# Patient Record
Sex: Female | Born: 1995 | Race: White | Hispanic: No | Smoking: Current every day smoker
Health system: Southern US, Community
[De-identification: ages and names within clinical notes are randomized; demographics above are authoritative.]

## PROBLEM LIST (undated history)

## (undated) DIAGNOSIS — F329 Major depressive disorder, single episode, unspecified: Secondary | ICD-10-CM

## (undated) DIAGNOSIS — F29 Unspecified psychosis not due to a substance or known physiological condition: Secondary | ICD-10-CM

## (undated) DIAGNOSIS — R87629 Unspecified abnormal cytological findings in specimens from vagina: Secondary | ICD-10-CM

## (undated) DIAGNOSIS — F319 Bipolar disorder, unspecified: Secondary | ICD-10-CM

## (undated) DIAGNOSIS — F32A Depression, unspecified: Secondary | ICD-10-CM

## (undated) DIAGNOSIS — F419 Anxiety disorder, unspecified: Secondary | ICD-10-CM

## (undated) DIAGNOSIS — F209 Schizophrenia, unspecified: Secondary | ICD-10-CM

## (undated) DIAGNOSIS — E282 Polycystic ovarian syndrome: Secondary | ICD-10-CM

## (undated) DIAGNOSIS — G43009 Migraine without aura, not intractable, without status migrainosus: Secondary | ICD-10-CM

## (undated) DIAGNOSIS — A64 Unspecified sexually transmitted disease: Secondary | ICD-10-CM

## (undated) HISTORY — DX: Major depressive disorder, single episode, unspecified: F32.9

## (undated) HISTORY — DX: Depression, unspecified: F32.A

## (undated) HISTORY — DX: Unspecified sexually transmitted disease: A64

## (undated) HISTORY — DX: Migraine without aura, not intractable, without status migrainosus: G43.009

## (undated) HISTORY — DX: Anxiety disorder, unspecified: F41.9

---
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
## 2013-01-06 ENCOUNTER — Encounter: Payer: Medicaid Other | Admitting: Obstetrics and Gynecology

## 2013-06-21 ENCOUNTER — Encounter: Payer: Medicaid Other | Admitting: Obstetrics and Gynecology

## 2013-06-28 ENCOUNTER — Ambulatory Visit (INDEPENDENT_AMBULATORY_CARE_PROVIDER_SITE_OTHER): Payer: BC Managed Care – PPO | Admitting: Obstetrics and Gynecology

## 2013-06-28 ENCOUNTER — Encounter (INDEPENDENT_AMBULATORY_CARE_PROVIDER_SITE_OTHER): Payer: Self-pay

## 2013-06-28 ENCOUNTER — Encounter: Payer: Self-pay | Admitting: Obstetrics and Gynecology

## 2013-06-28 VITALS — BP 136/60 | Ht 65.0 in | Wt 184.6 lb

## 2013-06-28 DIAGNOSIS — E282 Polycystic ovarian syndrome: Secondary | ICD-10-CM

## 2013-06-28 DIAGNOSIS — N898 Other specified noninflammatory disorders of vagina: Secondary | ICD-10-CM

## 2013-06-28 MED ORDER — NORGESTIM-ETH ESTRAD TRIPHASIC 0.18/0.215/0.25 MG-25 MCG PO TABS: 1.0000 | ORAL_TABLET | Freq: Every day | ORAL | Status: DC

## 2013-06-28 NOTE — Patient Instructions (Signed)
Restart OCP's  Dr T.C.[place] at [facility]

## 2013-06-28 NOTE — Progress Notes (Signed)
This chart was transcribed for Dr. Christin BachJohn Billiejo [name] by Theresa [name], ED scribe.   [facility] Visit  Patient name: Theresa [name] MRN [id number]  Date of birth: 02-15-1996  CC & HPI:  Theresa [name] is a 18 y.o. female presenting today for an abnormal pap smear follow up. [facility] She also reports increased appetite and weight gain in the past 1 month. She is sexually active. LMP was 4 days ago.   No family history of DVT/PE Hx elevated testosterone levels to 90 pg/ml, resolved to 50's with onset of regular menses. ROS:  Negative except listed above  Pertinent History Reviewed:  Medical & Surgical Hx:  Reviewed: Significant for   Past Medical History  Diagnosis Date  . Polycystic ovary syndrome     History reviewed. No pertinent past surgical history.   Medications: Reviewed & Updated - see associated section Social History: Reviewed -  reports that she has been smoking Cigarettes.  She has been smoking about 0.50 packs per day. She has never used smokeless tobacco.  Objective Findings:  Vitals: BP 136/60  Ht 5\' 5"  (1.651 m)  Wt 184 lb 9.6 oz (83.734 kg)  BMI 30.72 kg/m2  LMP 06/21/2013  Physical Examination: General appearance - alert, well appearing, and in no distress and oriented to person, place, and time Pelvic - VULVA: normal appearing vulva with no masses, tenderness or lesions, VAGINA: normal appearing vagina with normal color and discharge, no lesions, CERVIX: normal appearing cervix without discharge or lesions   Assessment & Plan:   A:  Tanner stage IV History of irregular periods Elevated testosterone levels consistent with PCOS  P: Birth control to regulate menstrual cycle

## 2013-06-28 NOTE — Addendum Note (Signed)
Addended by: Gaylyn RongEVANS, Carrell Rahmani A on: 06/28/2013 04:48 PM   Modules accepted: Orders

## 2013-06-30 NOTE — Addendum Note (Signed)
Addended by: Richardson ChiquitoRAVIS, Merica Prell M on: 06/30/2013 10:13 AM   Modules accepted: Orders

## 2014-05-19 ENCOUNTER — Emergency Department (HOSPITAL_COMMUNITY)
Admission: EM | Admit: 2014-05-19 | Discharge: 2014-05-19 | Disposition: A | Discharge status: Home / Self Care | Payer: Medicaid Other | Attending: Emergency Medicine | Admitting: Emergency Medicine

## 2014-05-19 ENCOUNTER — Encounter (HOSPITAL_COMMUNITY): Payer: Self-pay | Admitting: Emergency Medicine

## 2014-05-19 DIAGNOSIS — L259 Unspecified contact dermatitis, unspecified cause: Secondary | ICD-10-CM | POA: Insufficient documentation

## 2014-05-19 DIAGNOSIS — E282 Polycystic ovarian syndrome: Secondary | ICD-10-CM | POA: Insufficient documentation

## 2014-05-19 DIAGNOSIS — Z720 Tobacco use: Secondary | ICD-10-CM | POA: Insufficient documentation

## 2014-05-19 DIAGNOSIS — R002 Palpitations: Secondary | ICD-10-CM | POA: Insufficient documentation

## 2014-05-19 DIAGNOSIS — Z79899 Other long term (current) drug therapy: Secondary | ICD-10-CM | POA: Insufficient documentation

## 2014-05-19 DIAGNOSIS — L309 Dermatitis, unspecified: Secondary | ICD-10-CM

## 2014-05-19 HISTORY — DX: Unspecified abnormal cytological findings in specimens from vagina: R87.629

## 2014-05-19 MED ORDER — FLUOCINONIDE-E 0.05 % EX CREA: 1.0000 "application " | TOPICAL_CREAM | Freq: Two times a day (BID) | CUTANEOUS | Status: DC

## 2014-05-19 NOTE — Discharge Instructions (Signed)
Palpitations A palpitation is the feeling that your heartbeat is irregular or is faster than normal. It may feel like your heart is fluttering or skipping a beat. Palpitations are usually not a serious problem. However, in some cases, you may need further medical evaluation. CAUSES  Palpitations can be caused by:  Smoking.  Caffeine or other stimulants, such as diet pills or energy drinks.  Alcohol.  Stress and anxiety.  Strenuous physical activity.  Fatigue.  Certain medicines.  Heart disease, especially if you have a history of irregular heart rhythms (arrhythmias), such as atrial fibrillation, atrial flutter, or supraventricular tachycardia.  An improperly working pacemaker or defibrillator. DIAGNOSIS  To find the cause of your palpitations, your health care provider will take your medical history and perform a physical exam. Your health care provider may also have you take a test called an ambulatory electrocardiogram (ECG). An ECG records your heartbeat patterns over a 24-hour period. You may also have other tests, such as:  Transthoracic echocardiogram (TTE). During echocardiography, sound waves are used to evaluate how blood flows through your heart.  Transesophageal echocardiogram (TEE).  Cardiac monitoring. This allows your health care provider to monitor your heart rate and rhythm in real time.  Holter monitor. This is a portable device that records your heartbeat and can help diagnose heart arrhythmias. It allows your health care provider to track your heart activity for several days, if needed.  Stress tests by exercise or by giving medicine that makes the heart beat faster. TREATMENT  Treatment of palpitations depends on the cause of your symptoms and can vary greatly. Most cases of palpitations do not require any treatment other than time, relaxation, and monitoring your symptoms. Other causes, such as atrial fibrillation, atrial flutter, or supraventricular  tachycardia, usually require further treatment. HOME CARE INSTRUCTIONS   Avoid:  Caffeinated coffee, tea, soft drinks, diet pills, and energy drinks.  Chocolate.  Alcohol.  Stop smoking if you smoke.  Reduce your stress and anxiety. Things that can help you relax include:  A method of controlling things in your body, such as your heartbeats, with your mind (biofeedback).  Yoga.  Meditation.  Physical activity such as swimming, jogging, or walking.  Get plenty of rest and sleep. SEEK MEDICAL CARE IF:   You continue to have a fast or irregular heartbeat beyond 24 hours.  Your palpitations occur more often. SEEK IMMEDIATE MEDICAL CARE IF:  You have chest pain or shortness of breath.  You have a severe headache.  You feel dizzy or you faint. MAKE SURE YOU:  Understand these instructions.  Will watch your condition.  Will get help right away if you are not doing well or get worse. Document Released: 05/10/2000 Document Revised: 05/18/2013 Document Reviewed: 07/12/2011 [facility] Patient Information 2015 [employer] [place]. This information is not intended to replace advice given to you by your health care provider. Make sure you discuss any questions you have with your health care provider.  Fluocinolone skin cream, ointment, or topical solution What is this medicine? FLUOCINOLONE (floo oh SIN oh lone) is a corticosteroid. It is used on the skin to reduce swelling, redness, itching, and allergic reactions. This medicine may be used for other purposes; ask your health care provider or pharmacist if you have questions. COMMON BRAND NAME(S): Fluonid, Synalar What should I tell my health care provider before I take this medicine? They need to know if you have any of these conditions: -any active infection -diabetes -large areas of burned or damaged skin -  skin wasting or thinning -an unusual or allergic reaction to fluocinolone, steroids, other medicines, foods, dyes, or  preservatives -pregnant or trying to get pregnant -breast-feeding How should I use this medicine? This medicine is for external use only. Do not take by mouth. Follow the directions on the prescription label. Wash your hands before and after use. Apply a thin film of medicine to the affected area. Do not cover with a bandage or dressing unless your doctor or health care professional tells you to. Do not use on healthy skin or over large areas of skin. Do not get this medicine in your eyes. If you do, rinse out with plenty of cool tap water. It is important not to use more medicine than prescribed. Do not use your medicine more often than directed. Do not use for more than 14 days. Talk to your pediatrician regarding the use of this medicine in children. Special care may be needed. While this drug may be prescribed for children as young as 30 years of age for selected conditions, precautions do apply. If applying this medicine to the diaper area of a child, do not cover with tight-fitting diapers or plastic pants. This may increase the amount of medicine that passes through the skin and increase the risk of serious side effects. Elderly patients are more likely to have damaged skin through aging, and this may increase side effects. This medicine should only be used for brief periods and infrequently in older patients. Overdosage: If you think you have taken too much of this medicine contact a poison control center or emergency room at once. NOTE: This medicine is only for you. Do not share this medicine with others. What if I miss a dose? If you miss a dose, use it as soon as you can. If it is almost time for your next dose, use only that dose. Do not use double or extra doses. What may interact with this medicine? Interactions are not expected. Do not use any other skin products without telling your doctor or health care professional. This list may not describe all possible interactions. Give your health  care provider a list of all the medicines, herbs, non-prescription drugs, or dietary supplements you use. Also tell them if you smoke, drink alcohol, or use illegal drugs. Some items may interact with your medicine. What should I watch for while using this medicine? Tell your doctor or health care professional if your symptoms do not improve within one week. Tell your doctor or health care professional if you are exposed to anyone with measles or chickenpox, or if you develop sores or blisters that do not heal properly. What side effects may I notice from receiving this medicine? Side effects that you should report to your doctor or health care professional as soon as possible: -burning or itching of the skin -dark red spots on the skin -infection -lack of healing of skin condition -painful, red, pus filled blisters in hair follicles -thinning of the skin, sunburn more likely especially on the face Side effects that usually do not require medical attention (report to your doctor or health care professional if they continue or are bothersome): -dry skin, irritation -unusual increased growth of hair on the face or body This list may not describe all possible side effects. Call your doctor for medical advice about side effects. You may report side effects to FDA at 1-800-FDA-1088. Where should I keep my medicine? Keep out of the reach of children. Store at room temperature between 15  and 30 degrees C (59 and 86 degrees F). Protect from heat and direct light. Do not freeze. Throw away any unused medicine after the expiration date. NOTE: This sheet is a summary. It may not cover all possible information. If you have questions about this medicine, talk to your doctor, pharmacist, or health care provider.  2015, [employer]. (2007-09-16 16:57:22)

## 2014-05-19 NOTE — ED Notes (Signed)
Pt. Reports "I feel like my heart is skipping a beat". Pt. Reports this started yesterday and is intermittent. Pt. Also c/o rash to left foot.

## 2014-05-19 NOTE — ED Provider Notes (Signed)
CSN: [id number]     Arrival date & time 05/19/14  0212 History   First MD Initiated Contact with Patient 05/19/14 0435     Chief Complaint  Patient presents with  . Palpitations     (Consider location/radiation/quality/duration/timing/severity/associated sxs/prior Treatment) Patient is a 18 y.o. female presenting with palpitations. The history is provided by the patient.  Palpitations She has intermittent palpitations and has been having for the past year. She feels like there is a skipped beat or an extra beat. Symptoms have been stable over the last year. She denies chest pain, heaviness, tightness, pressure. She is also complaining of a rash on her feet. This is been present for a month. She's tried a variety of athlete's foot remedies without any benefit. The rashes on her toes and her shoes irritated. She denies any unusual exposures.  Past Medical History  Diagnosis Date  . Polycystic ovary syndrome   . Abnormal vaginal Pap smear    History reviewed. No pertinent past surgical history. Family History  Problem Relation Age of Onset  . Hypertension Mother   . Heart disease Mother   . Cancer Mother   . Hypertension Other   . Depression Father   . Drug abuse Father   . Bipolar disorder Father   . Heart disease Maternal Grandmother   . Pancreatitis Maternal Grandfather   . Cancer Paternal Grandmother   . Cancer Paternal Grandfather    History  Substance Use Topics  . Smoking status: Current Every Day Smoker -- 0.50 packs/day    Types: Cigarettes  . Smokeless tobacco: Never Used  . Alcohol Use: No   OB History    No data available     Review of Systems  Cardiovascular: Positive for palpitations.  All other systems reviewed and are negative.     Allergies  Review of patient's allergies indicates no known allergies.  Home Medications   Prior to Admission medications   Medication Sig Start Date End Date Taking? Authorizing Provider  Norgestimate-Ethinyl  Estradiol Triphasic (ORTHO TRI-CYCLEN LO) 0.18/0.215/0.25 MG-25 MCG tab Take 1 tablet by mouth daily. 06/28/13   Tilda BurrowJohn Ferguson V, MD   BP 115/61 mmHg  Pulse 83  Temp(Src) 97.6 F (36.4 C) (Oral)  Resp 12  Ht 5\' 3"  (1.6 m)  Wt 183 lb (83.008 kg)  BMI 32.43 kg/m2  SpO2 100%  LMP 05/12/2014 Physical Exam  Nursing note and vitals reviewed.  18 year old female, resting comfortably and in no acute distress. Vital signs are normal. Oxygen saturation is 100%, which is normal. Head is normocephalic and atraumatic. PERRLA, EOMI. Oropharynx is clear. Neck is nontender and supple without adenopathy or JVD. Back is nontender and there is no CVA tenderness. Lungs are clear without rales, wheezes, or rhonchi. Chest is nontender. Heart has regular rate and rhythm without murmur. Abdomen is soft, flat, nontender without masses or hepatosplenomegaly and peristalsis is normoactive. Extremities have no cyanosis or edema, full range of motion is present. There is thickening of the skin on the dorsum of the left third and fourth toes as well as the right third toe. There is no maceration or inflammation in the intertriginous areas. Skin is warm and dry without rash. Neurologic: Mental status is normal, cranial nerves are intact, there are no motor or sensory deficits.  ED Course  Procedures (including critical care time) Labs Review Labs Reviewed - No data to display  Imaging Review No results found.   EKG Interpretation   Date/Time:  Thursday May 19 2014 03:17:21 EST Ventricular Rate:  60 PR Interval:  121 QRS Duration: 114 QT Interval:  424 QTC Calculation: 424 R Axis:   82 Text Interpretation:  Sinus rhythm Borderline intraventricular conduction  delay Baseline wander in lead(s) V6 No old tracing to compare Confirmed by  [facility]  MD, Ladawna Walgren ([id number]) on 05/19/2014 3:29:05 AM      MDM   Final diagnoses:  Palpitations  Dermatitis    Subjective palpitations. This is chronic and I do  not see any indication for laboratory workup tonight. ECG shows no evidence of ectopy and I have not seen any ectopy on the monitor. Skin rash on the toes is probably a variant on eczema. She is given a prescription for Lidex cream. She is referred to cardiology for Holter monitor for evaluation of her palpitations and she is referred to dermatology for evaluation of her rash.    Dione Boozeavid Naryah Clenney, MD 05/19/14 [phone]

## 2014-10-25 ENCOUNTER — Emergency Department (HOSPITAL_COMMUNITY)
Admission: EM | Admit: 2014-10-25 | Discharge: 2014-10-26 | Disposition: A | Discharge status: Home / Self Care | Payer: Medicaid Other

## 2014-10-25 NOTE — ED Notes (Signed)
Called no answer from waiting room

## 2014-10-25 NOTE — ED Notes (Signed)
Called x 1 no answer

## 2014-10-25 NOTE — ED Notes (Signed)
Pt called no answer 

## 2015-06-26 ENCOUNTER — Emergency Department (HOSPITAL_COMMUNITY)
Admission: EM | Admit: 2015-06-26 | Discharge: 2015-06-26 | Disposition: A | Discharge status: Home / Self Care | Payer: BLUE CROSS/BLUE SHIELD | Attending: Emergency Medicine | Admitting: Emergency Medicine

## 2015-06-26 ENCOUNTER — Encounter (HOSPITAL_COMMUNITY): Payer: Self-pay | Admitting: *Deleted

## 2015-06-26 DIAGNOSIS — Z8639 Personal history of other endocrine, nutritional and metabolic disease: Secondary | ICD-10-CM | POA: Diagnosis not present

## 2015-06-26 DIAGNOSIS — J069 Acute upper respiratory infection, unspecified: Secondary | ICD-10-CM | POA: Diagnosis not present

## 2015-06-26 DIAGNOSIS — Z793 Long term (current) use of hormonal contraceptives: Secondary | ICD-10-CM | POA: Diagnosis not present

## 2015-06-26 DIAGNOSIS — Z79899 Other long term (current) drug therapy: Secondary | ICD-10-CM | POA: Insufficient documentation

## 2015-06-26 DIAGNOSIS — F17210 Nicotine dependence, cigarettes, uncomplicated: Secondary | ICD-10-CM | POA: Insufficient documentation

## 2015-06-26 DIAGNOSIS — R05 Cough: Secondary | ICD-10-CM | POA: Diagnosis present

## 2015-06-26 MED ORDER — DEXAMETHASONE 4 MG PO TABS: 4.0000 mg | ORAL_TABLET | Freq: Two times a day (BID) | ORAL | Status: DC

## 2015-06-26 MED ORDER — HYDROCOD POLST-CPM POLST ER 10-8 MG/5ML PO SUER
5.0000 mL | Freq: Once | ORAL | Status: AC
  Administered 2015-06-26: 5 mL via ORAL
  Filled 2015-06-26: qty 5

## 2015-06-26 MED ORDER — IBUPROFEN 800 MG PO TABS
800.0000 mg | ORAL_TABLET | Freq: Once | ORAL | Status: AC
  Administered 2015-06-26: 800 mg via ORAL
  Filled 2015-06-26: qty 1

## 2015-06-26 MED ORDER — LORATADINE-PSEUDOEPHEDRINE ER 5-120 MG PO TB12: 1.0000 | ORAL_TABLET | Freq: Two times a day (BID) | ORAL | Status: DC

## 2015-06-26 MED ORDER — PREDNISONE 10 MG PO TABS
60.0000 mg | ORAL_TABLET | Freq: Once | ORAL | Status: AC
  Administered 2015-06-26: 60 mg via ORAL
  Filled 2015-06-26: qty 1

## 2015-06-26 MED ORDER — HYDROCODONE-HOMATROPINE 5-1.5 MG/5ML PO SYRP: 5.0000 mL | ORAL_SOLUTION | Freq: Four times a day (QID) | ORAL | Status: DC | PRN

## 2015-06-26 NOTE — ED Provider Notes (Signed)
CSN: [id number]     Arrival date & time 06/26/15  2150 History   First MD Initiated Contact with Patient 06/26/15 2232     Chief Complaint  Patient presents with  . Generalized Body Aches     (Consider location/radiation/quality/duration/timing/severity/associated sxs/prior Treatment) Patient is a 20 y.o. female presenting with URI. The history is provided by the patient.  URI Presenting symptoms: congestion, cough and rhinorrhea   Duration:  4 days Timing:  Constant Progression:  Worsening Chronicity:  New Relieved by:  Nothing Worsened by:  Nothing tried Ineffective treatments:  None tried Associated symptoms: headaches, sneezing and wheezing   Risk factors: not elderly, no diabetes mellitus, no immunosuppression and no recent travel     Past Medical History  Diagnosis Date  . Polycystic ovary syndrome   . Abnormal vaginal Pap smear    History reviewed. No pertinent past surgical history. Family History  Problem Relation Age of Onset  . Hypertension Mother   . Heart disease Mother   . Cancer Mother   . Hypertension Other   . Depression Father   . Drug abuse Father   . Bipolar disorder Father   . Heart disease Maternal Grandmother   . Pancreatitis Maternal Grandfather   . Cancer Paternal Grandmother   . Cancer Paternal Grandfather    Social History  Substance Use Topics  . Smoking status: Current Every Day Smoker -- 0.50 packs/day    Types: Cigarettes  . Smokeless tobacco: Never Used  . Alcohol Use: No   OB History    No data available     Review of Systems  HENT: Positive for congestion, rhinorrhea and sneezing.   Respiratory: Positive for cough and wheezing.   Neurological: Positive for headaches.  All other systems reviewed and are negative.     Allergies  Review of patient's allergies indicates no known allergies.  Home Medications   Prior to Admission medications   Medication Sig Start Date End Date Taking? Authorizing Provider   fluocinonide-emollient (LIDEX-E) 0.05 % cream Apply 1 application topically 2 (two) times daily. 05/19/14   Dione Booze, MD  Norgestimate-Ethinyl Estradiol Triphasic (ORTHO TRI-CYCLEN LO) 0.18/0.215/0.25 MG-25 MCG tab Take 1 tablet by mouth daily. 06/28/13   Tilda Burrow, MD   BP 131/75 mmHg  Pulse 101  Temp(Src) 98.5 F (36.9 C) (Tympanic)  Resp 18  Ht 5' 3.5" (1.613 m)  Wt 81.647 kg  BMI 31.38 kg/m2  SpO2 100%  LMP  (LMP Unknown) Physical Exam  Constitutional: She is oriented to person, place, and time. She appears well-developed and well-nourished.  Non-toxic appearance.  HENT:  Head: Normocephalic.  Right Ear: Tympanic membrane and external ear normal.  Left Ear: Tympanic membrane and external ear normal.  Nasal congestion present.  Eyes: EOM and lids are normal. Pupils are equal, round, and reactive to light.  Neck: Normal range of motion. Neck supple. Carotid bruit is not present.  Cardiovascular: Normal rate, regular rhythm, normal heart sounds, intact distal pulses and normal pulses.   Pulmonary/Chest: Breath sounds normal. No respiratory distress. She has no wheezes. She has no rales.  Course breath sounds. Pt speaks in complete sentences.  Abdominal: Soft. Bowel sounds are normal. There is no tenderness. There is no guarding.  Musculoskeletal: Normal range of motion.  Lymphadenopathy:       Head (right side): No submandibular adenopathy present.       Head (left side): No submandibular adenopathy present.    She has no cervical adenopathy.  Neurological:  She is alert and oriented to person, place, and time. She has normal strength. No cranial nerve deficit or sensory deficit.  Skin: Skin is warm and dry. No rash noted.  Psychiatric: She has a normal mood and affect. Her speech is normal.  Nursing note and vitals reviewed.   ED Course  Procedures (including critical care time) Labs Review Labs Reviewed - No data to display  Imaging Review No results found. I  have personally reviewed and evaluated these images and lab results as part of my medical decision-making.   EKG Interpretation None      MDM Exam favors URI. Patient feeling some better after medications in the emergency department. The patient speaks in complete sentences without time problem. Patient was amateur he. Prescription for Hycodan, Decadron, and Claritin-D given to the patient. We discussed good handwashing, use a mask until symptoms have resolved, and increasing fluids. Patient acknowledges understanding of the discharge instructions.    Final diagnoses:  URI (upper respiratory infection)    *I have reviewed nursing notes, vital signs, and all appropriate lab and imaging results for this patient.[address], PA-C 06/28/15 1642  Glynn Octave, MD 06/29/15 1444

## 2015-06-26 NOTE — ED Notes (Signed)
Pt c/o generalized body aches x 4 days

## 2015-06-26 NOTE — Discharge Instructions (Signed)
Please increase fluids. Wash hands frequently. Use Claritin-D and Decadron every 12 hours. Use Tylenol every 4 hours, or ibuprofen every 6 hours. May use Hycodan for cough. This medication may cause drowsiness, please use with caution. Upper Respiratory Infection, Adult Most upper respiratory infections (URIs) are a viral infection of the air passages leading to the lungs. A URI affects the nose, throat, and upper air passages. The most common type of URI is nasopharyngitis and is typically referred to as "the common cold." URIs run their course and usually go away on their own. Most of the time, a URI does not require medical attention, but sometimes a bacterial infection in the upper airways can follow a viral infection. This is called a secondary infection. Sinus and middle ear infections are common types of secondary upper respiratory infections. Bacterial pneumonia can also complicate a URI. A URI can worsen asthma and chronic obstructive pulmonary disease (COPD). Sometimes, these complications can require emergency medical care and may be life threatening.  CAUSES Almost all URIs are caused by viruses. A virus is a type of germ and can spread from one person to another.  RISKS FACTORS You may be at risk for a URI if:   You smoke.   You have chronic heart or lung disease.  You have a weakened defense (immune) system.   You are very young or very old.   You have nasal allergies or asthma.  You work in crowded or poorly ventilated areas.  You work in health care facilities or schools. SIGNS AND SYMPTOMS  Symptoms typically develop 2-3 days after you come in contact with a cold virus. Most viral URIs last 7-10 days. However, viral URIs from the influenza virus (flu virus) can last 14-18 days and are typically more severe. Symptoms may include:   Runny or stuffy (congested) nose.   Sneezing.   Cough.   Sore throat.   Headache.   Fatigue.   Fever.   Loss of appetite.    Pain in your forehead, behind your eyes, and over your cheekbones (sinus pain).  Muscle aches.  DIAGNOSIS  Your health care provider may diagnose a URI by:  Physical exam.  Tests to check that your symptoms are not due to another condition such as:  Strep throat.  Sinusitis.  Pneumonia.  Asthma. TREATMENT  A URI goes away on its own with time. It cannot be cured with medicines, but medicines may be prescribed or recommended to relieve symptoms. Medicines may help:  Reduce your fever.  Reduce your cough.  Relieve nasal congestion. HOME CARE INSTRUCTIONS   Take medicines only as directed by your health care provider.   Gargle warm saltwater or take cough drops to comfort your throat as directed by your health care provider.  Use a warm mist humidifier or inhale steam from a shower to increase air moisture. This may make it easier to breathe.  Drink enough fluid to keep your urine clear or pale yellow.   Eat soups and other clear broths and maintain good nutrition.   Rest as needed.   Return to work when your temperature has returned to normal or as your health care provider advises. You may need to stay home longer to avoid infecting others. You can also use a face mask and careful hand washing to prevent spread of the virus.  Increase the usage of your inhaler if you have asthma.   Do not use any tobacco products, including cigarettes, chewing tobacco, or electronic cigarettes. If  you need help quitting, ask your health care provider. PREVENTION  The best way to protect yourself from getting a cold is to practice good hygiene.   Avoid oral or hand contact with people with cold symptoms.   Wash your hands often if contact occurs.  There is no clear evidence that vitamin C, vitamin E, echinacea, or exercise reduces the chance of developing a cold. However, it is always recommended to get plenty of rest, exercise, and practice good nutrition.  SEEK MEDICAL  CARE IF:   You are getting worse rather than better.   Your symptoms are not controlled by medicine.   You have chills.  You have worsening shortness of breath.  You have brown or red mucus.  You have yellow or brown nasal discharge.  You have pain in your face, especially when you bend forward.  You have a fever.  You have swollen neck glands.  You have pain while swallowing.  You have white areas in the back of your throat. SEEK IMMEDIATE MEDICAL CARE IF:   You have severe or persistent:  Headache.  Ear pain.  Sinus pain.  Chest pain.  You have chronic lung disease and any of the following:  Wheezing.  Prolonged cough.  Coughing up blood.  A change in your usual mucus.  You have a stiff neck.  You have changes in your:  Vision.  Hearing.  Thinking.  Mood. MAKE SURE YOU:   Understand these instructions.  Will watch your condition.  Will get help right away if you are not doing well or get worse.   This information is not intended to replace advice given to you by your health care provider. Make sure you discuss any questions you have with your health care provider.   Document Released: 11/06/2000 Document Revised: 09/27/2014 Document Reviewed: 08/18/2013 Elsevier Interactive Patient Education [employer]2016 [employer] Inc[employer]

## 2015-10-14 ENCOUNTER — Ambulatory Visit (INDEPENDENT_AMBULATORY_CARE_PROVIDER_SITE_OTHER): Payer: Commercial Managed Care - HMO | Admitting: Family Medicine

## 2015-10-14 VITALS — BP 118/74 | HR 68 | Temp 98.1°F | Resp 18 | Ht 64.0 in | Wt 177.2 lb

## 2015-10-14 DIAGNOSIS — R87610 Atypical squamous cells of undetermined significance on cytologic smear of cervix (ASC-US): Secondary | ICD-10-CM

## 2015-10-14 DIAGNOSIS — M2550 Pain in unspecified joint: Secondary | ICD-10-CM | POA: Diagnosis not present

## 2015-10-14 DIAGNOSIS — Z0000 Encounter for general adult medical examination without abnormal findings: Secondary | ICD-10-CM

## 2015-10-14 DIAGNOSIS — M791 Myalgia: Secondary | ICD-10-CM

## 2015-10-14 DIAGNOSIS — M609 Myositis, unspecified: Secondary | ICD-10-CM

## 2015-10-14 DIAGNOSIS — Z789 Other specified health status: Secondary | ICD-10-CM | POA: Diagnosis not present

## 2015-10-14 DIAGNOSIS — IMO0001 Reserved for inherently not codable concepts without codable children: Secondary | ICD-10-CM

## 2015-10-14 DIAGNOSIS — F0630 Mood disorder due to known physiological condition, unspecified: Secondary | ICD-10-CM | POA: Diagnosis not present

## 2015-10-14 DIAGNOSIS — N938 Other specified abnormal uterine and vaginal bleeding: Secondary | ICD-10-CM | POA: Diagnosis not present

## 2015-10-14 DIAGNOSIS — N943 Premenstrual tension syndrome: Secondary | ICD-10-CM | POA: Diagnosis not present

## 2015-10-14 DIAGNOSIS — B9689 Other specified bacterial agents as the cause of diseases classified elsewhere: Secondary | ICD-10-CM

## 2015-10-14 DIAGNOSIS — E282 Polycystic ovarian syndrome: Secondary | ICD-10-CM

## 2015-10-14 DIAGNOSIS — Z124 Encounter for screening for malignant neoplasm of cervix: Secondary | ICD-10-CM

## 2015-10-14 DIAGNOSIS — E349 Endocrine disorder, unspecified: Secondary | ICD-10-CM | POA: Diagnosis not present

## 2015-10-14 DIAGNOSIS — Z113 Encounter for screening for infections with a predominantly sexual mode of transmission: Secondary | ICD-10-CM

## 2015-10-14 DIAGNOSIS — N941 Unspecified dyspareunia: Secondary | ICD-10-CM

## 2015-10-14 DIAGNOSIS — A64 Unspecified sexually transmitted disease: Secondary | ICD-10-CM

## 2015-10-14 DIAGNOSIS — N760 Acute vaginitis: Secondary | ICD-10-CM

## 2015-10-14 DIAGNOSIS — A499 Bacterial infection, unspecified: Secondary | ICD-10-CM

## 2015-10-14 DIAGNOSIS — R6889 Other general symptoms and signs: Secondary | ICD-10-CM

## 2015-10-14 HISTORY — DX: Unspecified sexually transmitted disease: A64

## 2015-10-14 LAB — POCT URINALYSIS DIP (MANUAL ENTRY)
Bilirubin, UA: NEGATIVE
Blood, UA: NEGATIVE
Glucose, UA: NEGATIVE
Ketones, POC UA: NEGATIVE
Leukocytes, UA: NEGATIVE
Nitrite, UA: NEGATIVE
Protein Ur, POC: NEGATIVE
Spec Grav, UA: 1.02
Urobilinogen, UA: 0.2
pH, UA: 7

## 2015-10-14 LAB — POC MICROSCOPIC URINALYSIS (UMFC): Mucus: ABSENT

## 2015-10-14 LAB — POCT WET + KOH PREP
Trich by wet prep: ABSENT
Yeast by KOH: ABSENT
Yeast by wet prep: ABSENT

## 2015-10-14 LAB — POCT CBC
Granulocyte percent: 52.4 %G (ref 37–80)
HCT, POC: 41.2 % (ref 37.7–47.9)
Hemoglobin: 14.6 g/dL (ref 12.2–16.2)
Lymph, poc: 3.4 (ref 0.6–3.4)
MCH, POC: 31.4 pg — AB (ref 27–31.2)
MCHC: 35.4 g/dL (ref 31.8–35.4)
MCV: 88.9 fL (ref 80–97)
MID (cbc): 0.2 (ref 0–0.9)
MPV: 6.8 fL (ref 0–99.8)
POC Granulocyte: 3.9 (ref 2–6.9)
POC LYMPH PERCENT: 45.4 %L (ref 10–50)
POC MID %: 2.2 %M (ref 0–12)
Platelet Count, POC: 288 10*3/uL (ref 142–424)
RBC: 4.63 M/uL (ref 4.04–5.48)
RDW, POC: 12.5 %
WBC: 7.5 10*3/uL (ref 4.6–10.2)

## 2015-10-14 LAB — POCT GLYCOSYLATED HEMOGLOBIN (HGB A1C): Hemoglobin A1C: 5.4

## 2015-10-14 LAB — POCT URINE PREGNANCY: Preg Test, Ur: NEGATIVE

## 2015-10-14 MED ORDER — METRONIDAZOLE 500 MG PO TABS: 500.0000 mg | ORAL_TABLET | Freq: Two times a day (BID) | ORAL | Status: DC

## 2015-10-14 NOTE — Patient Instructions (Addendum)
[facility] - Drs. Seymour BarsSilva, Jertson, or [facility] [address] [place], [place] [id number] Main: [phone]    [facility] endocrinology Dr. Ernest Haberhristina Gherghe  Polycystic Ovarian Syndrome Polycystic ovarian syndrome (PCOS) is a common hormonal disorder among women of reproductive age. Most women with PCOS grow many small cysts on their ovaries. PCOS can cause problems with your periods and make it difficult to get pregnant. It can also cause an increased risk of miscarriage with pregnancy. If left untreated, PCOS can lead to serious health problems, such as diabetes and heart disease. CAUSES The cause of PCOS is not fully understood, but genetics may be a factor. SIGNS AND SYMPTOMS   Infrequent or no menstrual periods.   Inability to get pregnant (infertility) because of not ovulating.   Increased growth of hair on the face, chest, stomach, back, thumbs, thighs, or toes.   Acne, oily skin, or dandruff.   Pelvic pain.   Weight gain or obesity, usually carrying extra weight around the waist.   Type 2 diabetes.   High cholesterol.   High blood pressure.   Female-pattern baldness or thinning hair.   Patches of thickened and dark brown or black skin on the neck, arms, breasts, or thighs.   Tiny excess flaps of skin (skin tags) in the armpits or neck area.   Excessive snoring and having breathing stop at times while asleep (sleep apnea).   Deepening of the voice.   Gestational diabetes when pregnant.  DIAGNOSIS  There is no single test to diagnose PCOS.   Your health care provider will:   Take a medical history.   Perform a pelvic exam.   Have ultrasonography done.   Check your female and female hormone levels.   Measure glucose or sugar levels in the blood.   Do other blood tests.   If you are producing too many female hormones, your health care provider will make sure it is from PCOS. At the physical exam, your  health care provider will want to evaluate the areas of increased hair growth. Try to allow natural hair growth for a few days before the visit.   During a pelvic exam, the ovaries may be enlarged or swollen because of the increased number of small cysts. This can be seen more easily by using vaginal ultrasonography or screening to examine the ovaries and lining of the uterus (endometrium) for cysts. The uterine lining may become thicker if you have not been having a regular period.  TREATMENT  Because there is no cure for PCOS, it needs to be managed to prevent problems. Treatments are based on your symptoms. Treatment is also based on whether you want to have a baby or whether you need contraception.  Treatment may include:   Progesterone hormone to start a menstrual period.   Birth control pills to make you have regular menstrual periods.   Medicines to make you ovulate, if you want to get pregnant.   Medicines to control your insulin.   Medicine to control your blood pressure.   Medicine and diet to control your high cholesterol and triglycerides in your blood.  Medicine to reduce excessive hair growth.  Surgery, making small holes in the ovary, to decrease the amount of female hormone production. This is done through a long, lighted tube (laparoscope) placed into the pelvis through a tiny incision in the lower abdomen.  HOME CARE INSTRUCTIONS  Only take over-the-counter or prescription medicine as directed by your health care provider.  Pay attention to the foods you eat and your activity levels. This can help reduce the effects of PCOS.  Keep your weight under control.  Eat foods that are low in carbohydrate and high in fiber.  Exercise regularly. SEEK MEDICAL CARE IF:  Your symptoms do not get better with medicine.  You have new symptoms.   This information is not intended to replace advice given to you by your health care provider. Make sure you discuss any  questions you have with your health care provider.   Document Released: 09/06/2004 Document Revised: 03/03/2013 Document Reviewed: 10/29/2012 Elsevier Interactive Patient Education [employer]

## 2015-10-14 NOTE — Progress Notes (Addendum)
By signing my name below, I, Theresa [name], attest that this documentation has been prepared under the direction and in the presence of Theresa SorensonEva Arhaan Chesnut, MD.  Electronically Signed: Arvilla MarketMesha [name], Medical Scribe. 10/14/2015. 3:23 PM.  Subjective:    Patient ID: Theresa [name], female    DOB: 03/10/1996, 20 y.o.   MRN: [id number]  HPI  Chief Complaint  Patient presents with  . Annual Exam    INCLUDING PAP  . Depression    see screening    HPI Comments: Theresa [name] is a 20 y.o. female who came in with her mom with a PMHx of PCOS who presents to the [facility] for CPE. She was last seen for a full physical 3 years prior at [facility]. She has been sexually active for years on oral contraceptive. She also has PCOS dx as a teen. She did have a pap smear at that time despite only being 16 which showed ASCUS and they advised gynecological referral as she was positive for high risk HPV. PCOS was confirmed with elevated testosterone level of 100 with a nl high of 70.  She's was dx with PCOS at 7. Her menses last 40 days and was passing clots. When she had a pelvic US done and found 4 cyst on one side and 5 cyst on the other. She never went back to the doctor because she didn't have insurance coverage. She has always had severe PMS that last about 2 weeks even when she skipped her period for 2 months, and it messes up with her work. During her PMS she gets abdominal cramping and severe depression along with a number of other symptoms. She experiences a chronic back pain. She also experiences shooting pain in her rectum, a stabbing pain in her lower right flank, and back pain associated with her menses. She took metformin BID in the past but she stopped taking it because it made her dizzy. Birthcontrol has never helped her PMS. She stopped taking birthcontrol in 2015 because of the side affect of blood clots. She's tried to have kids in the past with her fiance but hasn't  been successful. She would like a referral to a gynecologist located in [place] that specializes in PCOS. She has started experiencing painful sex 2 years ago and doesn't understand why.She gets hair on her face and chest and pulls it with tweezers. Her hair is thinning out and the creases in her skin is hyperpigmented. She has heat intolerance, along with severe diaphoresis. Sometimes she would skip her period for 2 months and she would gain about 20 lbs. She doesn't exercise, but does a lot of walking and bending at work. She doesn't have the urge to pee and drinks two 12 oz bottles at work.  The last time she ate was at 1 am.  There are no active problems to display for this patient.  Past Medical History  Diagnosis Date  . Polycystic ovary syndrome   . Abnormal vaginal Pap smear    History reviewed. No pertinent past surgical history. No Known Allergies Prior to Admission medications   Medication Sig Start Date End Date Taking? Authorizing Provider  dexamethasone (DECADRON) 4 MG tablet Take 1 tablet (4 mg total) by mouth 2 (two) times daily with a meal. Patient not taking: Reported on 10/14/2015 06/26/15   Ivery QualeHobson Bryant, PA-C  fluocinonide-emollient (LIDEX-E) 0.05 % cream Apply 1 application topically 2 (two) times daily. Patient not taking: Reported on 10/14/2015 05/19/14  Dione Booze, MD  HYDROcodone-homatropine [facility]) 5-1.5 MG/5ML syrup Take 5 mLs by mouth every 6 (six) hours as needed. Patient not taking: Reported on 10/14/2015 06/26/15   Ivery Quale, PA-C  loratadine-pseudoephedrine (CLARITIN-D 12 HOUR) 5-120 MG tablet Take 1 tablet by mouth 2 (two) times daily. Patient not taking: Reported on 10/14/2015 06/26/15   Ivery Quale, PA-C  Norgestimate-Ethinyl Estradiol Triphasic (ORTHO TRI-CYCLEN LO) 0.18/0.215/0.25 MG-25 MCG tab Take 1 tablet by mouth daily. Patient not taking: Reported on 10/14/2015 06/28/13   Tilda Burrow, MD   Social History   Social History  . Marital Status:  Single    Spouse Name: N/A  . Number of Children: N/A  . Years of Education: N/A   Occupational History  . Not on file.   Social History Main Topics  . Smoking status: Current Every Day Smoker -- 0.50 packs/day    Types: Cigarettes  . Smokeless tobacco: Never Used  . Alcohol Use: No  . Drug Use: No  . Sexual Activity: Yes    Birth Control/ Protection: None   Other Topics Concern  . Not on file   Social History Narrative   Family History  Problem Relation Age of Onset  . Hypertension Mother   . Heart disease Mother   . Cancer Mother   . Hypertension Other   . Depression Father   . Drug abuse Father   . Bipolar disorder Father   . Heart disease Maternal Grandmother   . Pancreatitis Maternal Grandfather   . Cancer Paternal Grandmother   . Cancer Paternal Grandfather     Depression screen [facility] 2/9 10/14/2015  Decreased Interest 2  Down, Depressed, Hopeless 2  PHQ - 2 Score 4  Altered sleeping 3  Tired, decreased energy 3  Change in appetite 3  Feeling bad or failure about yourself  3  Trouble concentrating 3  Moving slowly or fidgety/restless 3  Suicidal thoughts 0  PHQ-9 Score 22  Difficult doing work/chores Extremely dIfficult    Review of Systems  Constitutional: Positive for diaphoresis and unexpected weight change.  HENT: Negative for facial swelling.   Gastrointestinal: Positive for diarrhea and rectal pain. Negative for constipation.  Endocrine: Positive for heat intolerance.  Genitourinary: Positive for flank pain, vaginal discharge, difficulty urinating and menstrual problem. Negative for dysuria.  Musculoskeletal: Positive for back pain. Negative for myalgias.  Skin: Negative for color change, pallor and rash.   Objective:  BP 118/74 mmHg  Pulse 68  Temp(Src) 98.1 F (36.7 C) (Oral)  Resp 18  Ht 5\' 4"  (1.626 m)  Wt 177 lb 3.2 oz (80.377 kg)  BMI 30.40 kg/m2  SpO2 98%  LMP 10/06/2015  Physical Exam  Constitutional: She appears well-developed  and well-nourished. No distress.  HENT:  Head: Normocephalic and atraumatic.  Moon face  Eyes: Conjunctivae are normal.  Neck: Neck supple.  Cardiovascular: Normal rate, regular rhythm and normal heart sounds.  Exam reveals no gallop and no friction rub.   No murmur heard. Pulmonary/Chest: Effort normal and breath sounds normal. No respiratory distress. She has no wheezes. She has no rales.  Genitourinary: Uterus is tender. Cervix exhibits friability. Right adnexum displays no fullness. Left adnexum displays no fullness. Vaginal discharge found.  Labia nl Vagina with moderate thick white frothy discharge Cervix appears nl but internal os is friable Positive cervical tenderness Positive uterine tenderness No adnexal fullness enlargement or tenderness  Musculoskeletal:  Tender and poorly defined mass over top of thoracic spine  Neurological: She is alert.  Skin: Skin is warm and dry.  Psychiatric: She has a normal mood and affect. Her behavior is normal.  Nursing note and vitals reviewed.  Results for orders placed or performed in visit on 10/14/15  POCT CBC  Result Value Ref Range   WBC 7.5 4.6 - 10.2 K/uL   Lymph, poc 3.4 0.6 - 3.4   POC LYMPH PERCENT 45.4 10 - 50 %L   MID (cbc) 0.2 0 - 0.9   POC MID % 2.2 0 - 12 %M   POC Granulocyte 3.9 2 - 6.9   Granulocyte percent 52.4 37 - 80 %G   RBC 4.63 4.04 - 5.48 M/uL   Hemoglobin 14.6 12.2 - 16.2 g/dL   HCT, POC 16.1 09.6 - 47.9 %   MCV 88.9 80 - 97 fL   MCH, POC 31.4 (A) 27 - 31.2 pg   MCHC 35.4 31.8 - 35.4 g/dL   RDW, POC 04.5 %   Platelet Count, POC 288 142 - 424 K/uL   MPV 6.8 0 - 99.8 fL  POCT urinalysis dipstick  Result Value Ref Range   Color, UA yellow yellow   Clarity, UA clear clear   Glucose, UA negative negative   Bilirubin, UA negative negative   Ketones, POC UA negative negative   Spec Grav, UA 1.020    Blood, UA negative negative   pH, UA 7.0    Protein Ur, POC negative negative   Urobilinogen, UA 0.2     Nitrite, UA Negative Negative   Leukocytes, UA Negative Negative  POCT urine pregnancy  Result Value Ref Range   Preg Test, Ur Negative Negative  POCT Microscopic Urinalysis ([facility])  Result Value Ref Range   WBC,UR,HPF,POC None None WBC/hpf   RBC,UR,HPF,POC None None RBC/hpf   Bacteria Moderate (A) None, Too numerous to count   Mucus Absent Absent   Epithelial Cells, UR Per Microscopy Moderate (A) None, Too numerous to count cells/hpf  POCT Wet + KOH Prep  Result Value Ref Range   Yeast by KOH Absent Present, Absent   Yeast by wet prep Absent Present, Absent   WBC by wet prep Few None, Few, Too numerous to count   Clue Cells Wet Prep HPF POC Moderate (A) None, Too numerous to count   Trich by wet prep Absent Present, Absent   Bacteria Wet Prep HPF POC Many (A) None, Few, Too numerous to count   Epithelial Cells By [employer] Pref ([facility]) Few None, Few, Too numerous to count   RBC,UR,HPF,POC Few (A) None RBC/hpf  POCT glycosylated hemoglobin (Hb A1C)  Result Value Ref Range   Hemoglobin A1C 5.4    Assessment & Plan:  New pt here today for a full physical 1. PCOS (polycystic ovarian syndrome) - reports this was diagnosed at 20 yo when she had 4-5 cysts on each ovary on [place]. Initially responded to OCPs then metformin 500 qd but had hypoglycemia type sxs when she increased the metformin to bid so she stopped it after sev wks. Has not been on OCPs in years and she has been trying to conceive and she was worried about side effects such as venous thromboembolism. Has not had TV/pelvic US since  20 yo and has not been seen by a provider in years due to lack of medical insurance.  Refer to gyn for further eval as pt also concerned that she could have endometriosis or other pelvic d/o due to onset of dyspareunia, low back and rectal pain, PMS, and PMDD over  the past 2 years.  2. PMS (premenstrual syndrome)   3. Mood disorder in conditions classified elsewhere   4. Unintentional weight change   5.  Dyspareunia in female   6. Dysfunctional uterine bleeding   7. Arthralgia   8. Myalgia and myositis   9. Abnormality of hormone - I suspect pt may have a different endocrine abnormality other than just PCOS so refer to endocrine - Dr Elvera Lennox - for further eval - she has many of the sxs of Cushing's syndrome with recent development of tender "buffalo hump" and moon face in addition to c/o skin changes (few stretch marks though not sig discolored but does have hyperpigmentation in skin creases), mildly enlarged thyroid, thinning of hair, heat intolerance/diaphoresis, abdominal obesity, hirsuitism, depression, infertility, and fatigue.  10. Screening for cervical cancer   11. Routine screening for STI (sexually transmitted infection)   12. Atypical squamous cells of undetermined significance on cytologic smear of cervix (ASC-US) - on pap smear in 2014 with HR HPV (pt was 20 yo) - these results are avail in Epic. Pt reports she was referred to gyn who reassured her that this did not need further eval due to her age but pt and her mother did not really understand the reasoning behind this and so were upset by the experience. Pt states that repeat pap 1 yr later still have abnormality but HPV was negative. She had received the 3 doses of gardasil prior to abnormality. Reassured pt that changes of the cervical cells often happen slowly and so cervical cancer is usually preventable and it was highly likely that abnormalities will resolve spontaneously, refer to gyn for second opinion as pt's maternal grandmother passed away from cervical cancer so pt and her mother still very anxious.  13. Bacterial vaginosis - flagyl    Orders Placed This Encounter  Procedures  . C-reactive protein  . Comprehensive metabolic panel  . Thyroid Panel With TSH  . HIV antibody  . RPR  . Hepatitis C Antibody  . Testosterone  . Cortisol  . Prolactin  . Sedimentation Rate  . Ambulatory referral to Endocrinology    Referral  Priority:  Routine    Referral Type:  Consultation    Referral Reason:  Specialty Services Required    Referred to Provider:  Carlus Pavlov, MD    Number of Visits Requested:  1  . Ambulatory referral to Gynecology    Referral Priority:  Routine    Referral Type:  Consultation    Referral Reason:  Specialty Services Required    Requested Specialty:  Gynecology    Number of Visits Requested:  1  . POCT CBC  . POCT urinalysis dipstick  . POCT urine pregnancy  . POCT Microscopic Urinalysis ([facility])  . POCT Wet + KOH Prep  . POCT glycosylated hemoglobin (Hb A1C)    Meds ordered this encounter  Medications  . metroNIDAZOLE (FLAGYL) 500 MG tablet    Sig: Take 1 tablet (500 mg total) by mouth 2 (two) times daily.    Dispense:  14 tablet    Refill:  0    I personally performed the services described in this documentation, which was scribed in my presence. The recorded information has been reviewed and considered, and addended by me as needed.  Theresa Sorenson, MD MPH  ADDENDUM: + TEST FOR CHLAMYDIA.  Due to complaints of pelvic pain and dyspareunia, will treat with PID regimen of azithromycin  x 1 then  qd d 2-7. Recheck with gyn.

## 2015-10-16 LAB — COMPREHENSIVE METABOLIC PANEL
ALT: 12 U/L (ref 6–29)
AST: 14 U/L (ref 10–30)
Albumin: 4.7 g/dL (ref 3.6–5.1)
Alkaline Phosphatase: 87 U/L (ref 33–115)
BUN: 14 mg/dL (ref 7–25)
CO2: 26 mmol/L (ref 20–31)
Calcium: 9.3 mg/dL (ref 8.6–10.2)
Chloride: 101 mmol/L (ref 98–110)
Creat: 0.95 mg/dL (ref 0.50–1.10)
Glucose, Bld: 118 mg/dL — ABNORMAL HIGH (ref 65–99)
Potassium: 4.1 mmol/L (ref 3.5–5.3)
Sodium: 137 mmol/L (ref 135–146)
Total Bilirubin: 0.6 mg/dL (ref 0.2–1.2)
Total Protein: 6.8 g/dL (ref 6.1–8.1)

## 2015-10-16 LAB — TESTOSTERONE: Testosterone: 73 ng/dL

## 2015-10-16 LAB — C-REACTIVE PROTEIN: CRP: <0.5 mg/dL (ref <0.60)

## 2015-10-16 LAB — THYROID PANEL WITH TSH
Free Thyroxine Index: 2.2 (ref 1.4–3.8)
T3 Uptake: 34 % (ref 22–35)
T4, Total: 6.4 ug/dL (ref 4.5–12.0)
TSH: 0.83 mIU/L

## 2015-10-16 LAB — CORTISOL: Cortisol, Plasma: 12.9 ug/dL

## 2015-10-16 LAB — HEPATITIS C ANTIBODY: HCV Ab: NEGATIVE

## 2015-10-16 LAB — PROLACTIN: Prolactin: 4 ng/mL

## 2015-10-16 LAB — HIV ANTIBODY (ROUTINE TESTING W REFLEX): HIV 1&2 Ab, 4th Generation: NONREACTIVE

## 2015-10-16 LAB — SEDIMENTATION RATE: Sed Rate: 1 mm/hr (ref 0–20)

## 2015-10-17 ENCOUNTER — Telehealth: Payer: Self-pay | Admitting: Obstetrics and Gynecology

## 2015-10-17 ENCOUNTER — Telehealth: Payer: Self-pay

## 2015-10-17 LAB — RPR

## 2015-10-17 MED ORDER — MELOXICAM 7.5 MG PO TABS: 7.5000 mg | ORAL_TABLET | Freq: Two times a day (BID) | ORAL | Status: DC | PRN

## 2015-10-17 NOTE — Telephone Encounter (Signed)
Patients mother is calling because the patient was recently seen for pain in her overies and back and depression. The mother states that nothing was prescribed and she would like for something to be sent in. She is also requesting a doctors note to return to work tomorrow but only if we can send some pain medication in today.   [phone] [facility] [address] [place].

## 2015-10-17 NOTE — Telephone Encounter (Signed)
The patient was seen for a full physical.  We also addressed a number of problems.  She was put on an antibiotic which I was hoping would reduce her pelvic pain.  She was also referred to endocrine and gyn - I see gyn called her today to try to sched appt.    I have sent her in some meloxicam which she can take up to twice a day - it is like a stronger ibuprofen but not hard on the stomach and lasts a full 24 hrs - this should help musculoskeletal back pain and menstrual pain. Do not use any other otc pain medication other than tylenol/acetaminophen while on the meloxicam - so no aleve, ibuprofen, motrin, advil, etc..  Pt is fine to work - no restrictions - I was not aware that I took her out of work . . .  If she wants to be treated for depression, I recommend coming back or can make an appointment with a therapist or psychiatrist.  The following clinics/doctors are all excellent:  [facility]  [address], [place], [place] [address]  [phone]  [facility]. [address], [place], [place] [address]  Phone: [phone]  Dr. Andee PolesParish McKinney [address], [place], [place] [address]  Phone: [phone]  [facility] [address], [place], [place] [address]  [phone]  [facility] [address], [address], [place], [id number] Phone: [phone]

## 2015-10-17 NOTE — Addendum Note (Signed)
Addended by: Norberto SorensonSHAW, Sindy Mccune on: 10/17/2015 06:36 PM   Modules accepted: Orders, SmartSet

## 2015-10-17 NOTE — Telephone Encounter (Signed)
Rx for pain?

## 2015-10-17 NOTE — Telephone Encounter (Signed)
Called and left a message for patient to call back to schedule a new patient doctor referral. °

## 2015-10-18 ENCOUNTER — Encounter: Payer: Self-pay | Admitting: Obstetrics and Gynecology

## 2015-10-18 ENCOUNTER — Ambulatory Visit (INDEPENDENT_AMBULATORY_CARE_PROVIDER_SITE_OTHER): Payer: Commercial Managed Care - HMO | Admitting: Obstetrics and Gynecology

## 2015-10-18 VITALS — BP 112/70 | HR 80 | Resp 16 | Ht 63.5 in | Wt 179.0 lb

## 2015-10-18 DIAGNOSIS — R102 Pelvic and perineal pain: Secondary | ICD-10-CM

## 2015-10-18 DIAGNOSIS — R3911 Hesitancy of micturition: Secondary | ICD-10-CM | POA: Diagnosis not present

## 2015-10-18 DIAGNOSIS — N946 Dysmenorrhea, unspecified: Secondary | ICD-10-CM

## 2015-10-18 DIAGNOSIS — F526 Dyspareunia not due to a substance or known physiological condition: Secondary | ICD-10-CM

## 2015-10-18 DIAGNOSIS — A749 Chlamydial infection, unspecified: Secondary | ICD-10-CM | POA: Diagnosis not present

## 2015-10-18 DIAGNOSIS — IMO0001 Reserved for inherently not codable concepts without codable children: Secondary | ICD-10-CM

## 2015-10-18 DIAGNOSIS — R1030 Lower abdominal pain, unspecified: Secondary | ICD-10-CM

## 2015-10-18 LAB — CBC WITH DIFFERENTIAL/PLATELET
Basophils Absolute: 72 cells/uL (ref 0–200)
Basophils Relative: 1 %
Eosinophils Absolute: 72 cells/uL (ref 15–500)
Eosinophils Relative: 1 %
HCT: 41.6 % (ref 35.0–45.0)
Hemoglobin: 14.1 g/dL (ref 11.7–15.5)
Lymphocytes Relative: 46 %
Lymphs Abs: 3312 cells/uL (ref 850–3900)
MCH: 30.8 pg (ref 27.0–33.0)
MCHC: 33.9 g/dL (ref 32.0–36.0)
MCV: 90.8 fL (ref 80.0–100.0)
MPV: 8.5 fL (ref 7.5–12.5)
Monocytes Absolute: 504 cells/uL (ref 200–950)
Monocytes Relative: 7 %
Neutro Abs: 3240 cells/uL (ref 1500–7800)
Neutrophils Relative %: 45 %
Platelets: 274 10*3/uL (ref 140–400)
RBC: 4.58 MIL/uL (ref 3.80–5.10)
RDW: 13.1 % (ref 11.0–15.0)
WBC: 7.2 10*3/uL (ref 3.8–10.8)

## 2015-10-18 LAB — PAP IG, CT-NG, RFX HPV ASCU
Chlamydia Probe Amp: DETECTED — AB
GC Probe Amp: NOT DETECTED

## 2015-10-18 LAB — POCT URINE PREGNANCY: Preg Test, Ur: NEGATIVE

## 2015-10-18 MED ORDER — NORGESTIM-ETH ESTRAD TRIPHASIC 0.18/0.215/0.25 MG-25 MCG PO TABS: 1.0000 | ORAL_TABLET | Freq: Every day | ORAL | Status: DC

## 2015-10-18 MED ORDER — AZITHROMYCIN 250 MG PO TABS: ORAL_TABLET | ORAL | Status: DC

## 2015-10-18 MED ORDER — NAPROXEN SODIUM 550 MG PO TABS: ORAL_TABLET | ORAL | Status: DC

## 2015-10-18 NOTE — Progress Notes (Signed)
Patient ID: Theresa [name], female   DOB: 05-20-1996, 20 y.o.   MRN: [id number] 20 y.o. [id number] SingleCaucasianF sent for a consultation by Dr Norberto Sorenson c/o abdominal pain, dyspareunia and irregular periods. She c/o an over 1 year h/o intermittent BLQ abdominal pain, hurts with bending/reaching, sharp pain. It is getting worse, now occuring daily. No fevers, no nausea, no emesis, no diarrhea, no constipation. She doesn't have the sensation that she has to void, she just goes to the bathroom intermittently and bears down. Stream varies from normal to a little at a time.  She has also been having deep dyspareunia for over a year. Occurs every time she has sex, not positional. Makes her stops having sex.  Menses q 1-6 months. Typically bleeds for 5-7 days, has bleed for 40 days. Severe dysmenorrhea, can't function well. Ibuprofen doesn't help. She was on metformin in the past for PCOS.  She has hair on her face that she removes, few on her chest, hair around her nipples and around her umbilicus. She was on Ortho-tri-cyclen low in the past and did well on it. She tried Yaz and didn't like it.  She and her boyfriend just broke up. Testing from last week +chlamydia, she hadn't been notified yet, antibiotics were called in. She is on Flagyl for BV.  Period Duration (Days): 5-7 days  Period Pattern: (!) Irregular Menstrual Flow: Heavy, Light Menstrual Control: Maxi pad, Tampon Menstrual Control Change Freq (Hours): changes pad/ tampon every hour on heavy days  Dysmenorrhea: (!) Severe Dysmenorrhea Symptoms: Cramping, Nausea, Headache  Patient's last menstrual period was 09/29/2015.          Sexually active: Yes.    The current method of family planning is none.    Exercising: Yes.    The patient has a physically strenuous job, but has no regular exercise apart from work.  Smoker:  yes  Health Maintenance: Pap:  10-14-15 WNL +chlamydia History of abnormal Pap:  Yes- 2014 ASCUS + for HPV in the  past TDaP:  Up to date Gardasil: completed all HPV   reports that she has been smoking Cigarettes.  She has been smoking about 0.50 packs per day. She has never used smokeless tobacco. She reports that she does not drink alcohol or use illicit drugs.  Past Medical History  Diagnosis Date  . Polycystic ovary syndrome   . Abnormal vaginal Pap smear   . STD (sexually transmitted disease) 10-14-15    chlamydia   . Migraine without aura     History reviewed. No pertinent past surgical history.  Current Outpatient Prescriptions  Medication Sig Dispense Refill  . metroNIDAZOLE (FLAGYL) 500 MG tablet Take 1 tablet (500 mg total) by mouth 2 (two) times daily. 14 tablet 0  . azithromycin (ZITHROMAX) 250 MG tablet 4 tabs po x 1 now 4 tablet 0  . meloxicam (MOBIC) 7.5 MG tablet Take 1 tablet (7.5 mg total) by mouth 2 (two) times daily as needed for pain. (Patient not taking: Reported on 10/18/2015) 60 tablet 0  . naproxen sodium (ANAPROX DS) 550 MG tablet 1 tab po q 12 hours prn pain 30 tablet 2  . Norgestimate-Ethinyl Estradiol Triphasic (ORTHO TRI-CYCLEN LO) 0.18/0.215/0.25 MG-25 MCG tab Take 1 tablet by mouth daily. 3 Package 0   No current facility-administered medications for this visit.    Family History  Problem Relation Age of Onset  . Hypertension Mother   . Heart disease Mother   . Cancer Mother   . Hypertension  Other   . Depression Father   . Drug abuse Father   . Bipolar disorder Father   . Heart disease Maternal Grandmother   . Pancreatitis Maternal Grandfather   . Cancer Paternal Grandmother   . Cancer Paternal Grandfather   MGM DVT, no known FH of clotting disorders.   Review of Systems  Constitutional: Negative.   HENT: Negative.   Eyes: Negative.   Respiratory: Negative.   Cardiovascular: Negative.   Gastrointestinal: Positive for abdominal pain.  Endocrine: Negative.   Genitourinary: Positive for menstrual problem, pelvic pain and dyspareunia.  Musculoskeletal:  Positive for back pain.  Skin: Negative.   Allergic/Immunologic: Negative.   Neurological: Negative.   Psychiatric/Behavioral: Negative.     Exam:   BP 112/70 mmHg  Pulse 80  Resp 16  Ht 5' 3.5" (1.613 m)  Wt 179 lb (81.194 kg)  BMI 31.21 kg/m2  LMP 09/29/2015  Weight change: @ Height:   Height: 5' 3.5" (161.3 cm)  Ht Readings from Last 3 Encounters:  10/18/15 5' 3.5" (1.613 m)  10/14/15  (1.626 m)  06/26/15 5' 3.5" (1.613 m)    General appearance: alert, cooperative and appears stated age Head: Normocephalic, without obvious abnormality, atraumatic Neck: no adenopathy, supple, symmetrical, trachea midline and thyroid normal to inspection and palpation Lungs: clear to auscultation bilaterally Heart: regular rate and rhythm Abdomen: soft, minimally tender in the LLQ, no rebound, no guarding, no masses Extremities: extremities normal, atraumatic, no cyanosis or edema Skin: Skin color, texture, turgor normal. No rashes or lesions. She removes the hair from her face, minimal hair on her lower abdomen Lymph nodes: Cervical, supraclavicular, and axillary nodes normal. No abnormal inguinal nodes palpated Neurologic: Grossly normal   Pelvic: External genitalia:  no lesions              Urethra:  normal appearing urethra with no masses, tenderness or lesions              Bartholins and Skenes: normal                 Vagina: normal appearing vagina with normal color and discharge, no lesions              Cervix: no lesions, no cervical motion tenderness Pelvic floor: not tender Bladder: tender to palpation               Bimanual Exam:  Uterus:  normal size, contour, position, consistency, mobility, non-tender and uterus midpositioned, mobile, normal sized, +/- tender, only tender when pushing abdominally (?bladder)              Adnexa: no masses, +/- tender, not consistent               Rectovaginal: Confirms               Anus:  normal sphincter tone, no  lesions  Chaperone was present for exam.  A:  Oligomenorrhea, normal TSH and prolactin from last week  Hirsutism, mild  PCOS  Dyspareunia  Dysmenorrhea  Abdominal pain  +Chlamydia from last week, her pain is long term, don't think she has PID  Difficulty with voiding (for years), bladder tender  BV, on flagyl  P:   Discussed the importance of endometrial protection  Restart OCP's, risks reviewed, no contraindications  Return for a pelvic ultrasound  Anaprox for pain  Check CBC with diff  Check urine for ua, c&s  Check UPT  May need referral to urology  F/U  for a pelvic ultrasound   CC: Norberto SorensonEva Shaw, MD Letter sent

## 2015-10-18 NOTE — Addendum Note (Signed)
Addended by: Norberto SorensonSHAW, EVA on: 10/18/2015 08:17 AM   Modules accepted: Orders, SmartSet

## 2015-10-18 NOTE — Telephone Encounter (Signed)
Mom advised

## 2015-10-18 NOTE — Patient Instructions (Signed)
Oral Contraception Information Oral contraceptive pills (OCPs) are medicines taken to prevent pregnancy. OCPs work by preventing the ovaries from releasing eggs. The hormones in OCPs also cause the cervical mucus to thicken, preventing the sperm from entering the uterus. The hormones also cause the uterine lining to become thin, not allowing a fertilized egg to attach to the inside of the uterus. OCPs are highly effective when taken exactly as prescribed. However, OCPs do not prevent sexually transmitted diseases (STDs). Safe sex practices, such as using condoms along with the pill, can help prevent STDs.  Before taking the pill, you may have a physical exam and Pap test. Your health care provider may order blood tests. The health care provider will make sure you are a good candidate for oral contraception. Discuss with your health care provider the possible side effects of the OCP you may be prescribed. When starting an OCP, it can take 2 to 3 months for the body to adjust to the changes in hormone levels in your body.  TYPES OF ORAL CONTRACEPTION  The combination pill--This pill contains estrogen and progestin (synthetic progesterone) hormones. The combination pill comes in 21-day, 28-day, or 91-day packs. Some types of combination pills are meant to be taken continuously (365-day pills). With 21-day packs, you do not take pills for 7 days after the last pill. With 28-day packs, the pill is taken every day. The last 7 pills are without hormones. Certain types of pills have more than 21 hormone-containing pills. With 91-day packs, the first 84 pills contain both hormones, and the last 7 pills contain no hormones or contain estrogen only.  The minipill--This pill contains the progesterone hormone only. The pill is taken every day continuously. It is very important to take the pill at the same time each day. The minipill comes in packs of 28 pills. All 28 pills contain the hormone.  ADVANTAGES OF ORAL  CONTRACEPTIVE PILLS  Decreases premenstrual symptoms.   Treats menstrual period cramps.   Regulates the menstrual cycle.   Decreases a heavy menstrual flow.   May treatacne, depending on the type of pill.   Treats abnormal uterine bleeding.   Treats polycystic ovarian syndrome.   Treats endometriosis.   Can be used as emergency contraception.  THINGS THAT CAN MAKE ORAL CONTRACEPTIVE PILLS LESS EFFECTIVE OCPs can be less effective if:   You forget to take the pill at the same time every day.   You have a stomach or intestinal disease that lessens the absorption of the pill.   You take OCPs with other medicines that make OCPs less effective, such as antibiotics, certain HIV medicines, and some seizure medicines.   You take expired OCPs.   You forget to restart the pill on day 7, when using the packs of 21 pills.  RISKS ASSOCIATED WITH ORAL CONTRACEPTIVE PILLS  Oral contraceptive pills can sometimes cause side effects, such as:  Headache.  Nausea.  Breast tenderness.  Irregular bleeding or spotting. Combination pills are also associated with a small increased risk of:  Blood clots.  Heart attack.  Stroke.   This information is not intended to replace advice given to you by your health care provider. Make sure you discuss any questions you have with your health care provider.   Document Released: 08/03/2002 Document Revised: 03/03/2013 Document Reviewed: 11/01/2012 Elsevier Interactive Patient Education 2016 Elsevier Inc. Polycystic Ovarian Syndrome Polycystic ovarian syndrome (PCOS) is a common hormonal disorder among women of reproductive age. Most women with PCOS grow many   small cysts on their ovaries. PCOS can cause problems with your periods and make it difficult to get pregnant. It can also cause an increased risk of miscarriage with pregnancy. If left untreated, PCOS can lead to serious health problems, such as diabetes and heart  disease. CAUSES The cause of PCOS is not fully understood, but genetics may be a factor. SIGNS AND SYMPTOMS   Infrequent or no menstrual periods.   Inability to get pregnant (infertility) because of not ovulating.   Increased growth of hair on the face, chest, stomach, back, thumbs, thighs, or toes.   Acne, oily skin, or dandruff.   Pelvic pain.   Weight gain or obesity, usually carrying extra weight around the waist.   Type 2 diabetes.   High cholesterol.   High blood pressure.   Female-pattern baldness or thinning hair.   Patches of thickened and dark brown or black skin on the neck, arms, breasts, or thighs.   Tiny excess flaps of skin (skin tags) in the armpits or neck area.   Excessive snoring and having breathing stop at times while asleep (sleep apnea).   Deepening of the voice.   Gestational diabetes when pregnant.  DIAGNOSIS  There is no single test to diagnose PCOS.   Your health care provider will:   Take a medical history.   Perform a pelvic exam.   Have ultrasonography done.   Check your female and female hormone levels.   Measure glucose or sugar levels in the blood.   Do other blood tests.   If you are producing too many female hormones, your health care provider will make sure it is from PCOS. At the physical exam, your health care provider will want to evaluate the areas of increased hair growth. Try to allow natural hair growth for a few days before the visit.   During a pelvic exam, the ovaries may be enlarged or swollen because of the increased number of small cysts. This can be seen more easily by using vaginal ultrasonography or screening to examine the ovaries and lining of the uterus (endometrium) for cysts. The uterine lining may become thicker if you have not been having a regular period.  TREATMENT  Because there is no cure for PCOS, it needs to be managed to prevent problems. Treatments are based on your symptoms.  Treatment is also based on whether you want to have a baby or whether you need contraception.  Treatment may include:   Progesterone hormone to start a menstrual period.   Birth control pills to make you have regular menstrual periods.   Medicines to make you ovulate, if you want to get pregnant.   Medicines to control your insulin.   Medicine to control your blood pressure.   Medicine and diet to control your high cholesterol and triglycerides in your blood.  Medicine to reduce excessive hair growth.  Surgery, making small holes in the ovary, to decrease the amount of female hormone production. This is done through a long, lighted tube (laparoscope) placed into the pelvis through a tiny incision in the lower abdomen.  HOME CARE INSTRUCTIONS  Only take over-the-counter or prescription medicine as directed by your health care provider.  Pay attention to the foods you eat and your activity levels. This can help reduce the effects of PCOS.  Keep your weight under control.  Eat foods that are low in carbohydrate and high in fiber.  Exercise regularly. SEEK MEDICAL CARE IF:  Your symptoms do not get better with   medicine.  You have new symptoms.   This information is not intended to replace advice given to you by your health care provider. Make sure you discuss any questions you have with your health care provider.   Document Released: 09/06/2004 Document Revised: 03/03/2013 Document Reviewed: 10/29/2012 Elsevier Interactive Patient Education 2016 Elsevier Inc.  

## 2015-10-19 ENCOUNTER — Telehealth: Payer: Self-pay | Admitting: Obstetrics and Gynecology

## 2015-10-19 ENCOUNTER — Telehealth: Payer: Self-pay | Admitting: *Deleted

## 2015-10-19 LAB — URINALYSIS, MICROSCOPIC ONLY
Casts: NONE SEEN [LPF]
Crystals: NONE SEEN [HPF]
RBC / HPF: NONE SEEN RBC/HPF (ref <=2)
Squamous Epithelial / LPF: NONE SEEN [HPF] (ref <=5)
WBC, UA: NONE SEEN WBC/HPF (ref <=5)
Yeast: NONE SEEN [HPF]

## 2015-10-19 NOTE — Telephone Encounter (Signed)
-----   Message from Romualdo BolkJill Evelyn Jertson, MD sent at 10/19/2015  8:47 AM EDT ----- Please inform the patient that her CBC was normal (not c/w infection). Urine culture is pending.

## 2015-10-19 NOTE — Telephone Encounter (Signed)
Called patient to review benefits for a recommended procedure. Left Voicemail requesting a call back. °

## 2015-10-19 NOTE — Telephone Encounter (Signed)
Left message to for lab results -eh

## 2015-10-20 ENCOUNTER — Telehealth: Payer: Self-pay | Admitting: Emergency Medicine

## 2015-10-20 MED ORDER — SULFAMETHOXAZOLE-TRIMETHOPRIM 800-160 MG PO TABS: 1.0000 | ORAL_TABLET | Freq: Two times a day (BID) | ORAL | Status: DC

## 2015-10-20 NOTE — Telephone Encounter (Signed)
-----   Message from Romualdo BolkJill Evelyn Jertson, MD sent at 10/20/2015  9:23 AM EDT ----- Please inform the patient that she does have a UTI and start her on bactrim ds, 1 po bid x 3 days, #6, no refills. Sensitivities are still pending, so make sure she understands it is possible we will change her antibiotics, but I don't want her to go the weekend without medication.

## 2015-10-20 NOTE — Telephone Encounter (Signed)
Return call to patient. Her Mother, Theresa [name] answered the phone. She states that patient is not available and that she is authorized to receive information on her daughters behalf.  Reviewed designated party release form, Mother Theresa [name] is listed.  She is given message from Dr. Oscar LaJertson. Advised preliminary results, will call back with final results.  She states that patient has been emotionally upset and quit her job yesterday. Called her Mother today stating she was having a panic attack. Advised if she is concerned for patient she should seek care at nearest emergency room. She states that patient cannot afford to seek care but she would keep an eye on her. Again advised to seek care at at Emergency Department if her symptoms of anxiety worsened.   She states she will talk with patient and pick up treatment for UTI. She is advised to call back with any concerns or if [name] has any questions.   Routing to provider for final review. Patient agreeable to disposition. Will close encounter.

## 2015-10-20 NOTE — Telephone Encounter (Signed)
Called patient to review benefits for a recommended procedure. Left Voicemail requesting a call back. °

## 2015-10-20 NOTE — Telephone Encounter (Signed)
Message left to return call to Delmarracy at [phone].   Order placed [facility] Bactrim DS [facility] Dr[facility]

## 2015-10-21 LAB — URINE CULTURE: Colony Count: >=100000

## 2015-10-24 ENCOUNTER — Telehealth: Payer: Self-pay | Admitting: Obstetrics and Gynecology

## 2015-10-24 NOTE — Telephone Encounter (Signed)
Patient called to schedule requested ultrasound, appointment is 10/31/15. Patient asked if her lab results were available from last week. Advised patient will have clinical to review to see if results were available. Patient is agreeable. Routing to clinical/triage for review

## 2015-10-24 NOTE — Telephone Encounter (Signed)
Spoke with pt regarding benefit for ultrasound. Patient understood and agreeable. Patient ready to schedule. Patient scheduled 10/31/15 with Dr Oscar LaJertson. Pt aware of arrival date and time. Pt aware of 72 hours cancellation policy with fee.

## 2015-10-24 NOTE — Telephone Encounter (Signed)
Left message to call Kaitlyn at [phone].  Notes Recorded by Romualdo BolkJill Evelyn Jertson, MD on 10/24/2015 at 12:06 PM Please inform the patient that she is on the correct antibiotic and see how she is feeling Notes Recorded by Joeseph Amorracy L Fast, RN on 10/20/2015 at 5:47 PM Mother of patient informed, she is on designated party release form. Notes Recorded by Joeseph Amorracy L Fast, RN on 10/20/2015 at 10:31 AM Message left to return call to Foxholmracy at [phone].  Notes Recorded by Romualdo BolkJill Evelyn Jertson, MD on 10/20/2015 at 9:23 AM Please inform the patient that she does have a UTI and start her on bactrim ds, 1 po bid x 3 days, #6, no refills. Sensitivities are still pending, so make sure she understands it is possible we will change her antibiotics, but I don't want her to go the weekend without medication. Notes Recorded by Lorri FrederickMartha E Hanner, CMA on 10/19/2015 at 8:54 AM 10-19-15 left message to call-eh Notes Recorded by Romualdo BolkJill Evelyn Jertson, MD on 10/19/2015 at 8:47 AM Please inform the patient that her CBC was normal (not c/w infection). Urine culture is pending.

## 2015-10-24 NOTE — Telephone Encounter (Signed)
Patient's mother "Gavin PoundDeborah" is retuning a call. DPR on file to talk with mom.

## 2015-10-24 NOTE — Telephone Encounter (Signed)
Patient's mother "Theresa [name]" is returning a call. DPR on file to talk with mom.

## 2015-10-24 NOTE — Telephone Encounter (Signed)
French Anaracy spoke with patient mother- see results note -eh

## 2015-10-24 NOTE — Telephone Encounter (Signed)
She has a f/u appointment here next week. We can discuss at that visit as well.

## 2015-10-25 ENCOUNTER — Telehealth: Payer: Self-pay | Admitting: *Deleted

## 2015-10-25 NOTE — Telephone Encounter (Signed)
-----   Message from Romualdo BolkJill Evelyn Jertson, MD sent at 10/24/2015 12:06 PM EDT ----- Please inform the patient that she is on the correct antibiotic and see how she is feeling

## 2015-10-25 NOTE — Telephone Encounter (Signed)
Left message for patient to call -eh 

## 2015-10-27 NOTE — Telephone Encounter (Signed)
Left message to call Kaitlyn at [phone]. 

## 2015-10-30 NOTE — Telephone Encounter (Signed)
Marchelle Folksmanda spoke with patient -see results note -eh

## 2015-10-31 ENCOUNTER — Ambulatory Visit (INDEPENDENT_AMBULATORY_CARE_PROVIDER_SITE_OTHER): Payer: Commercial Managed Care - HMO | Admitting: Obstetrics and Gynecology

## 2015-10-31 ENCOUNTER — Ambulatory Visit (INDEPENDENT_AMBULATORY_CARE_PROVIDER_SITE_OTHER): Payer: Commercial Managed Care - HMO

## 2015-10-31 ENCOUNTER — Encounter: Payer: Self-pay | Admitting: Obstetrics and Gynecology

## 2015-10-31 VITALS — Wt 179.0 lb

## 2015-10-31 DIAGNOSIS — R1030 Lower abdominal pain, unspecified: Secondary | ICD-10-CM

## 2015-10-31 DIAGNOSIS — N946 Dysmenorrhea, unspecified: Secondary | ICD-10-CM | POA: Diagnosis not present

## 2015-10-31 DIAGNOSIS — R102 Pelvic and perineal pain: Secondary | ICD-10-CM

## 2015-10-31 DIAGNOSIS — F32A Depression, unspecified: Secondary | ICD-10-CM

## 2015-10-31 DIAGNOSIS — F526 Dyspareunia not due to a substance or known physiological condition: Secondary | ICD-10-CM | POA: Diagnosis not present

## 2015-10-31 DIAGNOSIS — F329 Major depressive disorder, single episode, unspecified: Secondary | ICD-10-CM | POA: Diagnosis not present

## 2015-10-31 DIAGNOSIS — F4323 Adjustment disorder with mixed anxiety and depressed mood: Secondary | ICD-10-CM

## 2015-10-31 DIAGNOSIS — IMO0001 Reserved for inherently not codable concepts without codable children: Secondary | ICD-10-CM

## 2015-10-31 MED ORDER — CITALOPRAM HYDROBROMIDE 20 MG PO TABS: ORAL_TABLET | ORAL | Status: DC

## 2015-10-31 NOTE — Telephone Encounter (Signed)
Notes Recorded by Alphonsa OverallAmanda L Dixon, CMA on 10/30/2015 at 2:33 PM Spoke with patient and notified of results.  Routing to provider for final review. Patient agreeable to disposition. Will close encounter.
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
## 2015-11-03 ENCOUNTER — Emergency Department (HOSPITAL_COMMUNITY)
Admission: EM | Admit: 2015-11-03 | Discharge: 2015-11-04 | Disposition: A | Discharge status: Other Acute Inpatient Hospital | Payer: Commercial Managed Care - HMO | Attending: Emergency Medicine | Admitting: Emergency Medicine

## 2015-11-03 ENCOUNTER — Encounter (HOSPITAL_COMMUNITY): Payer: Self-pay | Admitting: Emergency Medicine

## 2015-11-03 DIAGNOSIS — F22 Delusional disorders: Secondary | ICD-10-CM | POA: Insufficient documentation

## 2015-11-03 DIAGNOSIS — Z79899 Other long term (current) drug therapy: Secondary | ICD-10-CM | POA: Diagnosis not present

## 2015-11-03 DIAGNOSIS — F17210 Nicotine dependence, cigarettes, uncomplicated: Secondary | ICD-10-CM | POA: Insufficient documentation

## 2015-11-03 DIAGNOSIS — F323 Major depressive disorder, single episode, severe with psychotic features: Secondary | ICD-10-CM | POA: Diagnosis not present

## 2015-11-03 LAB — URINALYSIS, ROUTINE W REFLEX MICROSCOPIC
Bilirubin Urine: NEGATIVE
Glucose, UA: NEGATIVE mg/dL
Hgb urine dipstick: NEGATIVE
Ketones, ur: NEGATIVE mg/dL
Leukocytes, UA: NEGATIVE
Nitrite: NEGATIVE
Protein, ur: NEGATIVE mg/dL
Specific Gravity, Urine: 1.016 (ref 1.005–1.030)
pH: 7 (ref 5.0–8.0)

## 2015-11-03 LAB — COMPREHENSIVE METABOLIC PANEL
ALT: 16 U/L (ref 14–54)
AST: 19 U/L (ref 15–41)
Albumin: 4.8 g/dL (ref 3.5–5.0)
Alkaline Phosphatase: 62 U/L (ref 38–126)
Anion gap: 7 (ref 5–15)
BUN: 11 mg/dL (ref 6–20)
CO2: 23 mmol/L (ref 22–32)
Calcium: 9 mg/dL (ref 8.9–10.3)
Chloride: 106 mmol/L (ref 101–111)
Creatinine, Ser: 0.86 mg/dL (ref 0.44–1.00)
GFR calc Af Amer: >60 mL/min (ref >60)
GFR calc non Af Amer: >60 mL/min (ref >60)
Glucose, Bld: 147 mg/dL — ABNORMAL HIGH (ref 65–99)
Potassium: 3.6 mmol/L (ref 3.5–5.1)
Sodium: 136 mmol/L (ref 135–145)
Total Bilirubin: 0.5 mg/dL (ref 0.3–1.2)
Total Protein: 7.8 g/dL (ref 6.5–8.1)

## 2015-11-03 LAB — CBC
HCT: 41.8 % (ref 36.0–46.0)
Hemoglobin: 14.4 g/dL (ref 12.0–15.0)
MCH: 30.6 pg (ref 26.0–34.0)
MCHC: 34.4 g/dL (ref 30.0–36.0)
MCV: 88.7 fL (ref 78.0–100.0)
Platelets: 306 10*3/uL (ref 150–400)
RBC: 4.71 MIL/uL (ref 3.87–5.11)
RDW: 12.4 % (ref 11.5–15.5)
WBC: 11.1 10*3/uL — ABNORMAL HIGH (ref 4.0–10.5)

## 2015-11-03 LAB — RAPID URINE DRUG SCREEN, HOSP PERFORMED
Amphetamines: NOT DETECTED
Barbiturates: NOT DETECTED
Benzodiazepines: NOT DETECTED
Cocaine: NOT DETECTED
Opiates: NOT DETECTED
Tetrahydrocannabinol: NOT DETECTED

## 2015-11-03 LAB — I-STAT BETA HCG BLOOD, ED (MC, WL, AP ONLY): I-stat hCG, quantitative: <5 m[IU]/mL (ref <5)

## 2015-11-03 LAB — URINE MICROSCOPIC-ADD ON

## 2015-11-03 LAB — ACETAMINOPHEN LEVEL: Acetaminophen (Tylenol), Serum: <10 ug/mL — ABNORMAL LOW (ref 10–30)

## 2015-11-03 LAB — PREGNANCY, URINE: Preg Test, Ur: NEGATIVE

## 2015-11-03 LAB — SALICYLATE LEVEL: Salicylate Lvl: <4 mg/dL (ref 2.8–30.0)

## 2015-11-03 LAB — ETHANOL: Alcohol, Ethyl (B): <5 mg/dL (ref <5)

## 2015-11-03 MED ORDER — TRAZODONE HCL 50 MG PO TABS
50.0000 mg | ORAL_TABLET | Freq: Every day | ORAL | Status: DC
  Administered 2015-11-03: 50 mg via ORAL
  Filled 2015-11-03: qty 1

## 2015-11-03 MED ORDER — HYDROXYZINE HCL 25 MG PO TABS
50.0000 mg | ORAL_TABLET | Freq: Two times a day (BID) | ORAL | Status: DC | PRN
  Administered 2015-11-03 (×2): 50 mg via ORAL
  Filled 2015-11-03 (×2): qty 2

## 2015-11-03 MED ORDER — NICOTINE 14 MG/24HR TD PT24
14.0000 mg | MEDICATED_PATCH | Freq: Once | TRANSDERMAL | Status: AC
  Administered 2015-11-03: 14 mg via TRANSDERMAL
  Filled 2015-11-03: qty 1

## 2015-11-03 NOTE — ED Notes (Signed)
Pt anxious at present, resting on stretcher, withdrawn.  Meds given.  Awake, alert & responsive, no distress noted, calm and cooperative.  Monitoring for safety, Q 15 min checks in effect.

## 2015-11-03 NOTE — ED Provider Notes (Signed)
CSN: [id number]     Arrival date & time 11/03/15  4098 History   First MD Initiated Contact with Patient 11/03/15 1021     Chief Complaint  Patient presents with  . Paranoid  . Medical Clearance     (Consider location/radiation/quality/duration/timing/severity/associated sxs/prior Treatment) HPI Comments: Patient is a 20 year old female with history of PCO S and migraines. She presents for evaluation of paranoia. She is accompanied today by her mother who states that Kharis has been crying excessively for the past several weeks. She is also accusing other people of recording her conversations, spying on her through Facebook, and believing people are poisoning her food. The mother reports stress with relationships, finances, and living arrangements, and suspects she is having a "nervous breakdown". She had a similar episode several years ago, however did not seek psychiatric care. She denies to me she is experiencing suicidal or homicidal ideation. She denies auditory or visual hallucinations.  She was seen by a primary Dr. last month and was started on Celexa which she has taken intermittently since.  The history is provided by the patient.    Past Medical History  Diagnosis Date  . Polycystic ovary syndrome   . Abnormal vaginal Pap smear   . STD (sexually transmitted disease) 10-14-15    chlamydia   . Migraine without aura    History reviewed. No pertinent past surgical history. Family History  Problem Relation Age of Onset  . Hypertension Mother   . Heart disease Mother   . Cancer Mother   . Hypertension Other   . Depression Father   . Drug abuse Father   . Bipolar disorder Father   . Heart disease Maternal Grandmother   . Pancreatitis Maternal Grandfather   . Cancer Paternal Grandmother   . Cancer Paternal Grandfather    Social History  Substance Use Topics  . Smoking status: Current Every Day Smoker -- 0.50 packs/day    Types: Cigarettes  . Smokeless tobacco: Never  Used  . Alcohol Use: No   OB History    Gravida Para Term Preterm AB TAB SAB Ectopic Multiple Living       Review of Systems  All other systems reviewed and are negative.     Allergies  Review of patient's allergies indicates no known allergies.  Home Medications   Prior to Admission medications   Medication Sig Start Date End Date Taking? Authorizing Provider  citalopram (CELEXA) 20 MG tablet Take 1/2 a tab a day for 1 week, then increase to 1 tab po qd 10/31/15   Romualdo Bolk, MD  naproxen sodium (ANAPROX DS) 550 MG tablet 1 tab po q 12 hours prn pain Patient not taking: Reported on 10/31/2015 10/18/15   Romualdo Bolk, MD  Norgestimate-Ethinyl Estradiol Triphasic (ORTHO TRI-CYCLEN LO) 0.18/0.215/0.25 MG-25 MCG tab Take 1 tablet by mouth daily. 10/18/15   Romualdo Bolk, MD   BP 143/79 mmHg  Pulse 90  Temp(Src) 98.4 F (36.9 C) (Oral)  Resp 16  SpO2 100%  LMP 09/29/2015 Physical Exam  Constitutional: She is oriented to person, place, and time. She appears well-developed and well-nourished. No distress.  HENT:  Head: Normocephalic and atraumatic.  Neck: Normal range of motion. Neck supple.  Cardiovascular: Normal rate and regular rhythm.  Exam reveals no gallop and no friction rub.   No murmur heard. Pulmonary/Chest: Effort normal and breath sounds normal. No respiratory distress. She has no wheezes.  Abdominal:  Soft. Bowel sounds are normal. She exhibits no distension. There is no tenderness.  Musculoskeletal: Normal range of motion.  Neurological: She is alert and oriented to person, place, and time.  Skin: Skin is warm and dry. She is not diaphoretic.  Psychiatric: Her speech is normal. She is withdrawn. Thought content is paranoid. Cognition and memory are normal. She expresses impulsivity. She exhibits a depressed mood.  Nursing note and vitals reviewed.   ED Course  Procedures (including critical care time) Labs Review Labs  Reviewed  COMPREHENSIVE METABOLIC PANEL  ETHANOL  SALICYLATE LEVEL  ACETAMINOPHEN LEVEL  CBC  URINE RAPID DRUG SCREEN, HOSP PERFORMED  URINALYSIS, ROUTINE W REFLEX MICROSCOPIC (NOT AT [facility])  PREGNANCY, URINE  I-STAT BETA HCG BLOOD, ED ([facility], [facility], [facility] ONLY)    Imaging Review No results found. I have personally reviewed and evaluated these images and lab results as part of my medical decision-making.   EKG Interpretation None      MDM   Final diagnoses:  None    Patient to be evaluated by TTS who will determine the appropriate psychiatric disposition. She appears to be medically cleared.    Geoffery Lyonsouglas Sneijder Bernards, MD 11/03/15 1505

## 2015-11-03 NOTE — ED Notes (Signed)
Pt's affect is flat and she stares into space. When asked a question she seems to be thinking, but does not answer anything, and continues to stare. She said "no" when asked if she feels like killing her self or other people.

## 2015-11-03 NOTE — BH Assessment (Signed)
[facility] Assessment Progress Note  Patient is open to a voluntary admission and requests stabilization. Case was staffed with Dominga FerryAgustin NP who recommended patient be observed and re-evaluated in the a.m. to determine disposition.
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
## 2015-11-03 NOTE — ED Notes (Signed)
With the assistance of Onalee HuaDavid, Therapist, assisted pt and her mother to sign the appropriate paperwork and pt signed a consent for her mother to receive information.

## 2015-11-03 NOTE — ED Notes (Signed)
Pt's mother is upset that her daughter would not sign a ROI and she said that her daughter told her that this Clinical research associatewriter said that she could not do so. This Clinical research associatewriter again spoke with pt alone and she did not seem to understand what a ROI is and would not respond yes or know. This Clinical research associatewriter spoke with her slowly and kindly to try and help her understand. Her mother was in bed with her, and this Clinical research associatewriter asked her mother to please sit in the chair while visiting. She became angry about that because she did not see anything wrong with that.

## 2015-11-03 NOTE — ED Notes (Signed)
Pt saw Onalee HuaDavid (therapist) in the hallway and asked, "Why did you make me sign that paper."  She was very agitated, anxious, and did not seem to understand why she is here.

## 2015-11-03 NOTE — BH Assessment (Addendum)
Assessment Note  Theresa [name] is an 20 y.o. female that presents with her mother Theresa [name] [phone]) reporting that patient was "acting strange" earlier this date. Patient was very tangential upon initial presentation and found it difficult to answer this writer's questions being very difficult to redirect. Patient stated she felt her partner (boyfriend) had been poisoning her this date although mother who was present, stated patient has not had any contact with that individual in over a week. Patient also stated she felt she was being monitored by "[employer]" stating "they know everything I do even when I am not on my computer." Patient denies any S/I or H/I but admits to feeling she is "detached" from her body. Mother who was present stated patient has been experiencing some depersonalization for the last week although admits that the patient has worsened over the last two days. Mother stated patient quit her job for no reason one month ago and moved in with her (mother) and has not been sleeping, eating and just "stares into space." Mother did state patient will go from being non responsive at times, then have a panic attack. Patient's mother stated this morning patient started being very agitated and started "grabbing her own clothes (while they were on her) and hyperventilating." Patient's mother stated she tried to transport patient to hospital but had to stop her vehicle and contact EMS to transport after patient starting "pulling her seatbelt and screaming." Patient denies any prior inpatient/outpatient treatment although patient was prescribed 20 mg of Celexa by her OB/GYN physician two weeks ago. Patient denies taking the medication on a regular bases. Patient did report taking 10 mg "one day this week." Patient reported that she had "polycystic ovarian syndrome" and has a fear she can't have children Patient denies any current SA use. Mother stated she feels her daughter is having a  "nervous breakdown." Patient mother feels patient has had a psychotic break and thinks her boyfriend is poisoning her. Patient reports frequent episodes of crying and depression. Patient is open to a voluntary admission and requests stabilization. Case was staffed with Dominga Ferry NP who recommended patient be observed and re-evaluated in the a.m. to determine disposition.    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  Past Medical History:  Past Medical History  Diagnosis Date  . Polycystic ovary syndrome   . Abnormal vaginal Pap smear   . STD (sexually transmitted disease) 10-14-15    chlamydia   . Migraine without aura     History reviewed. No pertinent past surgical history.  Family History:  Family History  Problem Relation Age of Onset  . Hypertension Mother   . Heart disease Mother   . Cancer Mother   . Hypertension Other   . Depression Father   . Drug abuse Father   . Bipolar disorder Father   . Heart disease Maternal Grandmother   . Pancreatitis Maternal Grandfather   . Cancer Paternal Grandmother   . Cancer Paternal Grandfather     Social History:  reports that she has been smoking Cigarettes.  She has been smoking about 0.50 packs per day. She has never used smokeless tobacco. She reports that she does not drink alcohol or use illicit drugs.  Additional Social History:  Alcohol / Drug Use Pain Medications: See MAR Prescriptions: See MAR Over the Counter: See MAR History of alcohol / drug use?: Yes Longest period of sobriety (when/how long): Current Substance #1 Name of Substance 1: Cocaine 1 - Age of First  Use: 19 1 - Amount (size/oz): 1 garm 1 - Frequency: Last weekend pt stated once 1 - Duration: one weekend 1 - Last Use / Amount: 10/14/15 Substance #2 Name of Substance 2: THC 2 - Age of First Use: 19 2 - Amount (size/oz): 1 gram  2 - Frequency: daily  2 - Duration: Last year 2 - Last Use / Amount: 09/24/15  CIWA: CIWA-Ar BP:  143/79 mmHg Pulse Rate: 90 COWS:    Allergies: No Known Allergies  Home Medications:  (Not in a hospital admission)  OB/GYN Status:  Patient's last menstrual period was 09/29/2015.  General Assessment Data [facility] of Assessment: [facility] TTS Assessment: In system Is this a Tele or Face-to-Face Assessment?: Face-to-Face Is this an Initial Assessment or a Re-assessment for this encounter?: Initial Assessment Marital status: Single Maiden name: na Is patient pregnant?: No Pregnancy Status: No Living Arrangements: Parent Can pt return to current living arrangement?: Yes Admission Status: Voluntary Is patient capable of signing voluntary admission?: Yes Referral Source: Self/Family/Friend Insurance type: [employer]  Medical Screening Exam [facility] ONLY) Medical Exam completed: Yes  Crisis Care Plan Living Arrangements: Parent Legal Guardian: Other: (None) Name of Psychiatrist: None Name of Therapist: None  Education Status Is patient currently in school?: No Current Grade: na Highest grade of school patient has completed: 12 Name of school: na Contact person: naq  Risk to self with the past 6 months Suicidal Ideation: No Has patient been a risk to self within the past 6 months prior to admission? : Yes (pt admitts to not having control) Suicidal Intent: No Has patient had any suicidal intent within the past 6 months prior to admission? : No Is patient at risk for suicide?: No (pt is currently not stable, extreme paranoia) Suicidal Plan?: No Has patient had any suicidal plan within the past 6 months prior to admission? : No Access to Means: No What has been your use of drugs/alcohol within the last 12 months?: Denies current  Previous Attempts/Gestures: No How many times?: 0 Other Self Harm Risks: None Triggers for Past Attempts: Other (Comment) (relationship issues) Intentional Self Injurious Behavior: None Family Suicide History: Yes (pt has a cousin  that passed away 2010 ) Recent stressful life event(s): Other (Comment) (relationship issues) Persecutory voices/beliefs?: No Depression: Yes Depression Symptoms: Feeling worthless/self pity, Feeling angry/irritable Substance abuse history and/or treatment for substance abuse?: No Suicide prevention information given to non-admitted patients: Not applicable  Risk to Others within the past 6 months Homicidal Ideation: No Does patient have any lifetime risk of violence toward others beyond the six months prior to admission? : Unknown Thoughts of Harm to Others: No Current Homicidal Intent: No Current Homicidal Plan: No Access to Homicidal Means: No Identified Victim: na History of harm to others?: No Assessment of Violence: None Noted Violent Behavior Description: na Does patient have access to weapons?: No Criminal Charges Pending?: No Does patient have a court date: No Is patient on probation?: No  Psychosis Hallucinations: None noted Delusions: None noted  Mental Status Report Appearance/Hygiene: Unremarkable Eye Contact: Fair Motor Activity: Freedom of movement Speech: Tangential Level of Consciousness: Unresponsive (To pain or command) Mood: Suspicious Affect: Anxious Anxiety Level: Minimal Thought Processes: Tangential Judgement: Impaired Orientation: Person, Place, Time Obsessive Compulsive Thoughts/Behaviors: None  Cognitive Functioning Concentration: Decreased Memory: Remote Intact, Remote Impaired IQ: Average Insight: Poor Impulse Control: Fair Appetite: Fair Weight Loss: 0 Weight Gain: 0 Sleep: Decreased Total Hours of Sleep: 2 Vegetative Symptoms: None  ADLScreening (  [facility] Assessment Services) Patient's cognitive ability adequate to safely complete daily activities?: Yes Patient able to express need for assistance with ADLs?: Yes Independently performs ADLs?: Yes (appropriate for developmental age)  Prior Inpatient Therapy Prior Inpatient Therapy:  No Prior Therapy Dates: na Prior Therapy Facilty/Provider(s): na Reason for Treatment: na  Prior Outpatient Therapy Prior Outpatient Therapy: No Prior Therapy Dates: na Prior Therapy Facilty/Provider(s): none Reason for Treatment: na Does patient have an ACCT team?: No Does patient have Intensive In-House Services?  : No Does patient have [facility] services? : No Does patient have P4CC services?: No  ADL Screening (condition at time of admission) Patient's cognitive ability adequate to safely complete daily activities?: Yes Is the patient deaf or have difficulty hearing?: No Does the patient have difficulty seeing, even when wearing glasses/contacts?: No Does the patient have difficulty concentrating, remembering, or making decisions?: Yes Patient able to express need for assistance with ADLs?: Yes Does the patient have difficulty dressing or bathing?: No Independently performs ADLs?: Yes (appropriate for developmental age) Does the patient have difficulty walking or climbing stairs?: No Weakness of Legs: None Weakness of Arms/Hands: None  Home Assistive Devices/Equipment Home Assistive Devices/Equipment: None  Therapy Consults (therapy consults require a physician order) PT Evaluation Needed: No OT Evalulation Needed: No SLP Evaluation Needed: No Abuse/Neglect Assessment (Assessment to be complete while patient is alone) Physical Abuse: Denies Verbal Abuse: Denies Sexual Abuse: Denies Exploitation of patient/patient's resources: Denies Self-Neglect: Denies Values / Beliefs Cultural Requests During Hospitalization: None Spiritual Requests During Hospitalization: None Consults Spiritual Care Consult Needed: No Social Work Consult Needed: No Merchant navy officer (For Healthcare) Does patient have an advance directive?: No Would patient like information on creating an advanced directive?: No - patient declined information (pt declines information)    Additional  Information 1:1 In Past 12 Months?: No CIRT Risk: No Elopement Risk: No Does patient have medical clearance?: Yes     Disposition:  Case was staffed with Dominga Ferry NP who recommended patient be observed and re-evaluated in the a.m. to determine disposition.     Disposition Initial Assessment Completed for this Encounter: Yes Disposition of Patient: Other dispositions (Observe and re-eval in the a.m.) Other disposition(s): Other (Comment) (Observation and re-evaluation in the a.m.)  On Site Evaluation by:   Reviewed with Physician:    Alfredia Ferguson 11/03/2015 12:18 PM

## 2015-11-03 NOTE — ED Notes (Signed)
Pt would not sign release of information for her mother to be able to receive information. This Clinical research associatewriter discussed it with her without her mother in the room, and she seemed to understand the situation.

## 2015-11-03 NOTE — ED Notes (Signed)
Pt belongings sent home with mother. 

## 2015-11-04 ENCOUNTER — Encounter (HOSPITAL_COMMUNITY): Payer: Self-pay | Admitting: Registered Nurse

## 2015-11-04 DIAGNOSIS — F323 Major depressive disorder, single episode, severe with psychotic features: Secondary | ICD-10-CM | POA: Diagnosis not present

## 2015-11-04 DIAGNOSIS — F22 Delusional disorders: Secondary | ICD-10-CM | POA: Diagnosis not present

## 2015-11-04 MED ORDER — NORGESTIM-ETH ESTRAD TRIPHASIC 0.18/0.215/0.25 MG-25 MCG PO TABS: 1.0000 | ORAL_TABLET | Freq: Every day | ORAL | Status: DC

## 2015-11-04 MED ORDER — DIPHENHYDRAMINE HCL 25 MG PO CAPS: 50.0000 mg | ORAL_CAPSULE | Freq: Once | ORAL | Status: AC

## 2015-11-04 MED ORDER — LORAZEPAM 2 MG/ML IJ SOLN
1.0000 mg | Freq: Once | INTRAMUSCULAR | Status: AC
  Administered 2015-11-04: 1 mg via INTRAMUSCULAR
  Filled 2015-11-04: qty 1

## 2015-11-04 MED ORDER — CITALOPRAM HYDROBROMIDE 10 MG PO TABS
20.0000 mg | ORAL_TABLET | Freq: Every day | ORAL | Status: DC
  Filled 2015-11-04: qty 2

## 2015-11-04 MED ORDER — CARBAMAZEPINE 200 MG PO TABS
200.0000 mg | ORAL_TABLET | Freq: Two times a day (BID) | ORAL | Status: DC
  Filled 2015-11-04 (×3): qty 1

## 2015-11-04 MED ORDER — LORAZEPAM 2 MG/ML IJ SOLN
2.0000 mg | Freq: Once | INTRAMUSCULAR | Status: AC
  Administered 2015-11-04: 2 mg via INTRAMUSCULAR
  Filled 2015-11-04: qty 1

## 2015-11-04 MED ORDER — DIPHENHYDRAMINE HCL 50 MG/ML IJ SOLN
50.0000 mg | Freq: Once | INTRAMUSCULAR | Status: AC
  Administered 2015-11-04: 50 mg via INTRAMUSCULAR
  Filled 2015-11-04: qty 1

## 2015-11-04 MED ORDER — LORAZEPAM 1 MG PO TABS: 2.0000 mg | ORAL_TABLET | Freq: Once | ORAL | Status: AC

## 2015-11-04 NOTE — ED Notes (Signed)
Pt attempting to exit locked doors, pt pushing on doors.  NP Alberteen SamFran Hobson notified.

## 2015-11-04 NOTE — Consult Note (Signed)
[facility] Consult   Reason for Consult:  Odd behavior Referring Physician:  EDP Patient Identification: Theresa [name] MRN:  [id number] Principal Diagnosis: MDD (major depressive disorder), single episode, severe with psychotic features [facility]) Diagnosis:   Patient Active Problem List   Diagnosis Date Noted  . MDD (major depressive disorder), single episode, severe with psychotic features ([place]) [F32.3] 11/04/2015  . Migraine without aura [G43.009]     Total Time spent with patient: 30 minutes  Subjective:   Theresa [name] is a 20 y.o. female patient presented to [facility] with mother having complaints of odd behavior, depression and crying spells  HPI:  Theresa [name] is 20 y.o. female that presented to [facility] with her mother with reports that patient has been acting strange; experiencing some depersonalization for the last week although admits that the patient has worsened over the last two days. Mother stated patient quit her job for no reason one month.  Reports that she has not been sleeping and feel; and frequent episodes of crying and depression.  Patient seen by this provider and chart reviewed 11/04/2015.  On evaluation: During first attempt to interview   [name] was asleep and difficulty waking up.  Nursing reported that patient hasn't been sleeping.  During second attempt to interview patient Theresa [name] she was sitting up in bed but appeared confused and refused to answer any questions.  Patient appeared to be dazed and confused.  Unsure if patient was responding to internal/external stimuli.     Past Psychiatric History: Depression  Risk to Self: Suicidal Ideation: No Suicidal Intent: No Is patient at risk for suicide?: No (pt is currently not stable, extreme paranoia) Suicidal Plan?: No Access to Means: No What has been your use of drugs/alcohol within the last 12 months?: Denies current  How many times?: 0 Other Self Harm Risks:  None Triggers for Past Attempts: Other (Comment) (relationship issues) Intentional Self Injurious Behavior: None Risk to Others: Homicidal Ideation: No Thoughts of Harm to Others: No Current Homicidal Intent: No Current Homicidal Plan: No Access to Homicidal Means: No Identified Victim: na History of harm to others?: No Assessment of Violence: None Noted Violent Behavior Description: na Does patient have access to weapons?: No Criminal Charges Pending?: No Does patient have a court date: No Prior Inpatient Therapy: Prior Inpatient Therapy: No Prior Therapy Dates: na Prior Therapy Facilty/Provider(s): na Reason for Treatment: na Prior Outpatient Therapy: Prior Outpatient Therapy: No Prior Therapy Dates: na Prior Therapy Facilty/Provider(s): none Reason for Treatment: na Does patient have an ACCT team?: No Does patient have Intensive In-House Services?  : No Does patient have [facility] services? : No Does patient have P4CC services?: No  Past Medical History:  Past Medical History  Diagnosis Date  . Polycystic ovary syndrome   . Abnormal vaginal Pap smear   . STD (sexually transmitted disease) 10-14-15    chlamydia   . Migraine without aura    History reviewed. No pertinent past surgical history. Family History:  Family History  Problem Relation Age of Onset  . Hypertension Mother   . Heart disease Mother   . Cancer Mother   . Hypertension Other   . Depression Father   . Drug abuse Father   . Bipolar disorder Father   . Heart disease Maternal Grandmother   . Pancreatitis Maternal Grandfather   . Cancer Paternal Grandmother   . Cancer Paternal Grandfather    Family Psychiatric  History: No family psych history Social  History:  History  Alcohol Use No     History  Drug Use No    Social History   Social History  . Marital Status: Single    Spouse Name: N/A  . Number of Children: N/A  . Years of Education: N/A   Social History Main Topics  . Smoking status:  Current Every Day Smoker -- 0.50 packs/day    Types: Cigarettes  . Smokeless tobacco: Never Used  . Alcohol Use: No  . Drug Use: No  . Sexual Activity:    Partners: Male    Birth Control/ Protection: None   Other Topics Concern  . None   Social History Narrative   Additional Social History:    Allergies:  No Known Allergies  Labs:  Results for orders placed or performed during the hospital encounter of 11/03/15 (from the past 48 hour(s))  Rapid urine drug screen (hospital performed)     Status: None   Collection Time: 11/03/15 10:51 AM  Result Value Ref Range   Opiates NONE DETECTED NONE DETECTED   Cocaine NONE DETECTED NONE DETECTED   Benzodiazepines NONE DETECTED NONE DETECTED   Amphetamines NONE DETECTED NONE DETECTED   Tetrahydrocannabinol NONE DETECTED NONE DETECTED   Barbiturates NONE DETECTED NONE DETECTED    Comment:        DRUG SCREEN FOR MEDICAL PURPOSES ONLY.  IF CONFIRMATION IS NEEDED FOR ANY PURPOSE, NOTIFY LAB WITHIN 5 DAYS.        LOWEST DETECTABLE LIMITS FOR URINE DRUG SCREEN Drug Class       Cutoff (ng/mL) Amphetamine      1000 Barbiturate      200 Benzodiazepine   456 Tricyclics       256 Opiates          300 Cocaine          300 THC              50   Urinalysis, Routine w reflex microscopic     Status: Abnormal   Collection Time: 11/03/15 10:51 AM  Result Value Ref Range   Color, Urine YELLOW YELLOW   APPearance TURBID (A) CLEAR   Specific Gravity, Urine 1.016 1.005 - 1.030   pH 7.0 5.0 - 8.0   Glucose, UA NEGATIVE NEGATIVE mg/dL   Hgb urine dipstick NEGATIVE NEGATIVE   Bilirubin Urine NEGATIVE NEGATIVE   Ketones, ur NEGATIVE NEGATIVE mg/dL   Protein, ur NEGATIVE NEGATIVE mg/dL   Nitrite NEGATIVE NEGATIVE   Leukocytes, UA NEGATIVE NEGATIVE  Pregnancy, urine     Status: None   Collection Time: 11/03/15 10:51 AM  Result Value Ref Range   Preg Test, Ur NEGATIVE NEGATIVE    Comment:        THE SENSITIVITY OF THIS METHODOLOGY IS >20  mIU/mL.   Urine microscopic-add on     Status: Abnormal   Collection Time: 11/03/15 10:51 AM  Result Value Ref Range   Squamous Epithelial / LPF 6-30 (A) NONE SEEN   WBC, UA 0-5 0 - 5 WBC/hpf   RBC / HPF 0-5 0 - 5 RBC/hpf   Bacteria, UA MANY (A) NONE SEEN   Urine-Other AMORPHOUS URATES/PHOSPHATES     Comment: MUCOUS PRESENT  Comprehensive metabolic panel     Status: Abnormal   Collection Time: 11/03/15 11:03 AM  Result Value Ref Range   Sodium 136 135 - 145 mmol/L   Potassium 3.6 3.5 - 5.1 mmol/L   Chloride 106 101 - 111 mmol/L   CO2  23 22 - 32 mmol/L   Glucose, Bld 147 (H) 65 - 99 mg/dL   BUN 11 6 - 20 mg/dL   Creatinine, Ser 0.86 0.44 - 1.00 mg/dL   Calcium 9.0 8.9 - 10.3 mg/dL   Total Protein 7.8 6.5 - 8.1 g/dL   Albumin 4.8 3.5 - 5.0 g/dL   AST 19 15 - 41 U/L   ALT 16 14 - 54 U/L   Alkaline Phosphatase 62 38 - 126 U/L   Total Bilirubin 0.5 0.3 - 1.2 mg/dL   GFR calc non Af Amer >60 >60 mL/min   GFR calc Af Amer >60 >60 mL/min    Comment: (NOTE) The eGFR has been calculated using the CKD EPI equation. This calculation has not been validated in all clinical situations. eGFR's persistently <60 mL/min signify possible Chronic Kidney Disease.    Anion gap 7 5 - 15  Ethanol     Status: None   Collection Time: 11/03/15 11:03 AM  Result Value Ref Range   Alcohol, Ethyl (B) <5 <5 mg/dL    Comment:        LOWEST DETECTABLE LIMIT FOR SERUM ALCOHOL IS 5 mg/dL FOR MEDICAL PURPOSES ONLY   Salicylate level     Status: None   Collection Time: 11/03/15 11:03 AM  Result Value Ref Range   Salicylate Lvl <8.1 2.8 - 30.0 mg/dL  Acetaminophen level     Status: Abnormal   Collection Time: 11/03/15 11:03 AM  Result Value Ref Range   Acetaminophen (Tylenol), Serum <10 (L) 10 - 30 ug/mL    Comment:        THERAPEUTIC CONCENTRATIONS VARY SIGNIFICANTLY. A RANGE OF 10-30 ug/mL MAY BE AN EFFECTIVE CONCENTRATION FOR MANY PATIENTS. HOWEVER, SOME ARE BEST TREATED AT CONCENTRATIONS  OUTSIDE THIS RANGE. ACETAMINOPHEN CONCENTRATIONS >150 ug/mL AT 4 HOURS AFTER INGESTION AND >50 ug/mL AT 12 HOURS AFTER INGESTION ARE OFTEN ASSOCIATED WITH TOXIC REACTIONS.   cbc     Status: Abnormal   Collection Time: 11/03/15 11:03 AM  Result Value Ref Range   WBC 11.1 (H) 4.0 - 10.5 K/uL   RBC 4.71 3.87 - 5.11 MIL/uL   Hemoglobin 14.4 12.0 - 15.0 g/dL   HCT 41.8 36.0 - 46.0 %   MCV 88.7 78.0 - 100.0 fL   MCH 30.6 26.0 - 34.0 pg   MCHC 34.4 30.0 - 36.0 g/dL   RDW 12.4 11.5 - 15.5 %   Platelets 306 150 - 400 K/uL  I-Stat beta hCG blood, ED     Status: None   Collection Time: 11/03/15 11:15 AM  Result Value Ref Range   I-stat hCG, quantitative <5.0 <5 mIU/mL   Comment 3            Comment:   GEST. AGE      CONC.  (mIU/mL)   <=1 WEEK        5 - 50     2 WEEKS       50 - 500     3 WEEKS       100 - 10,000     4 WEEKS     1,000 - 30,000        FEMALE AND NON-PREGNANT FEMALE:     LESS THAN 5 mIU/mL     Current Facility-Administered Medications  Medication Dose Route Frequency Provider Last Rate Last Dose  . carbamazepine (TEGRETOL) tablet 200 mg  200 mg Oral BID Lurena Nida, NP   200 mg at 11/04/15 0153  .  citalopram (CELEXA) tablet 20 mg  20 mg Oral Daily Shuvon B Rankin, NP      . hydrOXYzine (ATARAX/VISTARIL) tablet 50 mg  50 mg Oral BID PRN Kerrie Buffalo, NP   50 mg at 11/03/15 2124  . nicotine (NICODERM CQ - dosed in mg/24 hours) patch 14 mg  14 mg Transdermal Once Kerrie Buffalo, NP   14 mg at 11/03/15 1725  . Norgestimate-Ethinyl Estradiol Triphasic 0.18/0.215/0.25 MG-25 MCG tablet 1 tablet  1 tablet Oral Daily Shuvon B Rankin, NP      . traZODone (DESYREL) tablet 50 mg  50 mg Oral QHS Kerrie Buffalo, NP   50 mg at 11/03/15 2124   Current Outpatient Prescriptions  Medication Sig Dispense Refill  . citalopram (CELEXA) 20 MG tablet Take 1/2 a tab a day for 1 week, then increase to 1 tab po qd 30 tablet 1  . Norgestimate-Ethinyl Estradiol Triphasic (ORTHO TRI-CYCLEN  LO) 0.18/0.215/0.25 MG-25 MCG tab Take 1 tablet by mouth daily. 3 Package 0  . naproxen sodium (ANAPROX DS) 550 MG tablet 1 tab po q 12 hours prn pain (Patient not taking: Reported on 10/31/2015) 30 tablet 2    Musculoskeletal: Strength & Muscle Tone: within normal limits Gait & Station: normal Patient leans: N/A  Psychiatric Specialty Exam: Physical Exam  Neck: Normal range of motion.  Respiratory: Effort normal.  Musculoskeletal: Normal range of motion.  Psychiatric: She is actively hallucinating. She exhibits a depressed mood.    Review of Systems  Unable to perform ROS: acuity of condition  Psychiatric/Behavioral: Positive for depression and hallucinations. The patient is nervous/anxious.     Blood pressure 121/68, pulse 86, temperature 98.7 F (37.1 C), temperature source Oral, resp. rate 16, last menstrual period 09/29/2015, SpO2 98 %.There is no weight on file to calculate BMI.  General Appearance: Disheveled  Eye Contact:  Minimal  Speech:  Patient would not speak  Volume:  Unable to determine at this time  Mood:  Depressed  Affect:  Constricted  Thought Process:  Disorganized  Orientation:  Other:  Unable to determine at this time  Thought Content:  Unable to determine at this time  Suicidal Thoughts:  Unable to determine at this time  Homicidal Thoughts:  Unable to determine at this time  Memory:  Immediate;   Poor Recent;   Poor Remote;   Poor  Judgement:  Impaired  Insight:  Lacking  Psychomotor Activity:  Decreased  Concentration:  Concentration: Poor and Attention Span: Poor  Recall:  Poor  Fund of Knowledge:  Fair  Language:  Fair  Akathisia:  No  Handed:  Right  AIMS (if indicated):     Assets:  Desire for Improvement Housing Social Support  ADL's:  Intact  Cognition:  WNL  Sleep:        Treatment Plan Summary: Daily contact with patient to assess and evaluate symptoms and progress in treatment and Medication management Restarted Celexa 20 mg for  depression and PTSD;   Disposition: Recommend psychiatric Inpatient admission when medically cleared.  Rankin, Delphia Grates, NP 11/04/2015 2:22 PM Patient seen face to face for psychiatric evaluation. Chart reviewed and finding discussed with Physician extender. Agreed with disposition and treatment plan.   Berniece Andreas, MD

## 2015-11-04 NOTE — ED Provider Notes (Addendum)
Called for an IM dose of ativan as pt is anxious and refusing her oral medication.  IM dose of ativan ordered.  Linwood DibblesJon Smokey Melott, MD 11/04/15 1523  [facility].   Vitals stable.  Plan on transfer  Linwood DibblesJon Tangy Drozdowski, MD 11/04/15 [phone]

## 2015-11-04 NOTE — Progress Notes (Signed)
Disposition [name] received contact from [facility] staff confirming bed for patient, Dr. Daphane ShepherdMeyer is accepting provider.  [name] [facility]ed [facility] to [employer] and [employer] arrived stating they will go to the [employer] [employer] to pick up paperwork and serve and transport [facility].  Seward SpeckLeo Charod Slawinski [facility] LLCCSW,LCAS Behavioral Health Disposition [name] [phone]

## 2015-11-04 NOTE — ED Notes (Signed)
Pt staring at this nurse with blank stare when this nurse asked pt assessment questions. Pt  Not answering questions at this time. Special checks q 15 mins in place for safety. Video monitoring in place.

## 2015-11-04 NOTE — ED Notes (Signed)
Pt laying in bed, appears to be sleeping. No s/s of acute distress noted, breathing non labored, respirations equal. Will continue to monitor on special checks q 15 mins for safety. Video monitoring in place.

## 2015-11-04 NOTE — ED Notes (Signed)
Pt mother at bedside visiting with pt. Reports she spoke with social worker in regards to IVC and disposition and is concerned about transfer to [facility].  Reports to this nurse that "You are making her night mere come true." Transfer discussed with mom and pt.

## 2015-11-04 NOTE — Progress Notes (Signed)
Disposition [name] completed patient referrals to the following inpatient psych facilities:  [facility]  [name] will continue to follow patient for placement needs.  Seward SpeckLeo Perle [facility],LC[facility] Behavioral Health Disposition [name] [phone]

## 2015-11-04 NOTE — ED Notes (Signed)
Pt presents sitting up in bed, rocking back and forth, shaking, increase anxiety noted, paranoia noted. Pt refusing PO medication. EDP notified.

## 2015-11-07 ENCOUNTER — Telehealth (HOSPITAL_COMMUNITY): Payer: Self-pay

## 2015-11-07 NOTE — Telephone Encounter (Signed)
Patient was seen in the emergency room on the weekend.  I do not recall talking to the mother.  She was recommended for inpatient treatment however I do not recall discussing hospital choice with the patient's mother.  Disposition was done by emergency room weekend staff

## 2015-11-07 NOTE — Telephone Encounter (Signed)
I called the mother back. I explained that Dr. Lolly MustacheArfeen is not the Dr. that sent her daughter to [facility] and that this is usually done on a census basis. Patients mother kept pressing me for information wanting to know why her daughter was admitted to begin with. I told her that I did not see a signed HIPAA in the chart and therefore could not give her any patient information. She states that the staff at [facility] called her and told her that patient is not eating and not talking and they do not know why she is there. Patients mother stated that she wants her daughter released immediately and I let her know she would need to speak with the Dr's at [facility]. She became very agitated at this point and stated that she was going to get an attorney and "sue all our asses if her daughter dies of starvation" she then hung up on me.

## 2015-11-08 ENCOUNTER — Encounter: Payer: Self-pay | Admitting: Family Medicine

## 2015-11-09 ENCOUNTER — Telehealth: Payer: Self-pay | Admitting: Obstetrics and Gynecology

## 2015-11-09 NOTE — Telephone Encounter (Signed)
Spoke with patient's mother Gavin PoundDeborah, okay per ROI. Patient's mother requests to speak with Dr.Jertson directly. Reports her daughter has been taking Citalopram prescribed by Dr.Jertson and has been admitted to a "mental institution." Gavin PoundDeborah declines to give RN any further information and again requests to speak with Dr.Jertson. Mother placed on hold for Dr.Jertson to speak with mother at this time.

## 2015-11-09 NOTE — Telephone Encounter (Signed)
Awaiting call from the Child psychotherapistocial Worker

## 2015-11-09 NOTE — Telephone Encounter (Signed)
I spoke to the patient's mother (okay per dpr), she is in a panic about her daughter Theresa [name]. Theresa [name] had a panic attack last Friday, and had what sounds like delusional thinking. She had recently been started on Celexa. Her mother took her to the ER and she was involuntarily admitted to [place] mental hospital [place]. Her mother Theresa [name] states that no one is telling her what is going on, even though the daughter signed a consent. Theresa [name] is not talking or acting appropriately, she thinks the drugs they have put her on our doing this.    Her mother requests help to figure out what is going on. She wants another opinion. Would like her transferred out of the current hospital. I called the unit she is on, [phone], gave her ID code of [id number]. I left a message for the social worker to please call back. I requested that she try and obtain consent if possible for her to talk to me.  I left a message for the patient's mother.

## 2015-11-09 NOTE — Telephone Encounter (Signed)
Patient's mom Gavin PoundDeborah (dpr on file) calling with concerns after patient took citalopram. Patient was admitted [facility] and mom says is not doing very well.

## 2015-11-09 NOTE — Telephone Encounter (Signed)
Left message for patient's mother Gavin PoundDeborah, okay per ROI. Advised Dr.Jertson has contacted the Child psychotherapistsocial worker and is waiting for a return call. Dr.Jertson is currently seeing patients and will be out of town starting tomorrow. Advised if she has any further questions can may return call to the office and ask to speak with triage nurse.

## 2015-11-09 NOTE — Telephone Encounter (Signed)
Patient's mom Gavin PoundDeborah is returning a call to Dr. Oscar LaJertson.

## 2015-11-10 NOTE — Telephone Encounter (Signed)
Spoke with patient's mother Gavin PoundDeborah, okay per ROI. Advised of message as seen below from Dr.Jertson. Aware Dr.Jertson is out of the office today and will return Monday. Gavin PoundDeborah is agreeable and very grateful for Dr.Jertson's help.

## 2015-11-14 ENCOUNTER — Encounter: Payer: Self-pay | Admitting: Obstetrics and Gynecology

## 2015-11-14 ENCOUNTER — Ambulatory Visit (INDEPENDENT_AMBULATORY_CARE_PROVIDER_SITE_OTHER): Payer: Commercial Managed Care - HMO | Admitting: Obstetrics and Gynecology

## 2015-11-14 VITALS — BP 126/70 | HR 80 | Resp 14 | Wt 163.0 lb

## 2015-11-14 DIAGNOSIS — N898 Other specified noninflammatory disorders of vagina: Secondary | ICD-10-CM

## 2015-11-14 DIAGNOSIS — F32A Depression, unspecified: Secondary | ICD-10-CM

## 2015-11-14 DIAGNOSIS — R300 Dysuria: Secondary | ICD-10-CM

## 2015-11-14 DIAGNOSIS — F329 Major depressive disorder, single episode, unspecified: Secondary | ICD-10-CM | POA: Diagnosis not present

## 2015-11-14 LAB — POCT URINALYSIS DIPSTICK
Bilirubin, UA: NEGATIVE
Glucose, UA: NEGATIVE
Ketones, UA: NEGATIVE
Leukocytes, UA: NEGATIVE
Nitrite, UA: NEGATIVE
Protein, UA: NEGATIVE
Urobilinogen, UA: 0.2
pH, UA: 6.5

## 2015-11-14 NOTE — Telephone Encounter (Signed)
Returned call, spoke with the patient's mother Gavin PoundDeborah. She got her out of the [facility], but states she isn't the same. She won't talk.  The patient is having trouble voiding, pain when she tries.  Will come in today for an appointment.

## 2015-11-14 NOTE — Progress Notes (Deleted)
Patient ID: Theresa [name], female   DOB: [birth year], 20 y.o.   MRN: [id number] GYNECOLOGY  VISIT   HPI: 20 y.o.   Single  Caucasian  female   G0P0000 with Patient's last menstrual period was 11/07/2015.   here for possible urinary symptoms. Mom says she is having a hard time urinating. The patient was [facility] after a [facility] day stay. She won't speak. Mom thinks she is having trouble sitting, thinks it hurts to void, patient won't respond. Mother is very worried.  She shakes her head no to being suicidal. Otherwise won't respond to any questions.   GYNECOLOGIC HISTORY: Patient's last menstrual period was 11/07/2015. Contraception:none Menopausal hormone therapy: none        OB History    Gravida Para Term Preterm AB TAB SAB Ectopic Multiple Living   0 0 0 0 0 0 0 0 0 0          Patient Active Problem List   Diagnosis Date Noted  . MDD (major depressive disorder), single episode, severe with psychotic features (HCC) 11/04/2015  . Paranoia (HCC)   . Migraine without aura     Past Medical History  Diagnosis Date  . Polycystic ovary syndrome   . Abnormal vaginal Pap smear   . STD (sexually transmitted disease) 10-14-15    chlamydia   . Migraine without aura   . Anxiety   . Depression     History reviewed. No pertinent past surgical history.  Current Outpatient Prescriptions  Medication Sig Dispense Refill  . Norgestimate-Ethinyl Estradiol Triphasic (ORTHO TRI-CYCLEN LO) 0.18/0.215/0.25 MG-25 MCG tab Take 1 tablet by mouth daily. (Patient not taking: Reported on 11/14/2015) 3 Package 0   No current facility-administered medications for this visit.     ALLERGIES: Review of patient's allergies indicates no known allergies.  Family History  Problem Relation Age of Onset  . Hypertension Mother   . Heart disease Mother   . Cancer Mother   . Hypertension Other   . Depression Father   . Drug abuse Father   . Bipolar disorder Father   .  Heart disease Maternal Grandmother   . Pancreatitis Maternal Grandfather   . Cancer Paternal Grandmother   . Cancer Paternal Grandfather     Social History   Social History  . Marital Status: Single    Spouse Name: N/A  . Number of Children: N/A  . Years of Education: N/A   Occupational History  . Not on file.   Social History Main Topics  . Smoking status: Current Every Day Smoker -- 0.50 packs/day    Types: Cigarettes  . Smokeless tobacco: Never Used  . Alcohol Use: No  . Drug Use: No  . Sexual Activity:    Partners: Male    Birth Control/ Protection: None   Other Topics Concern  . Not on file   Social History Narrative    Review of Systems  Constitutional: Negative.   HENT: Negative.   Eyes: Negative.   Respiratory: Negative.   Cardiovascular: Negative.   Gastrointestinal: Negative.   Genitourinary:       Unable to urinate  Musculoskeletal: Negative.   Skin: Negative.   Neurological: Negative.   Endo/Heme/Allergies: Negative.   Psychiatric/Behavioral: The patient is nervous/anxious.     PHYSICAL EXAMINATION:    BP 126/70 mmHg  Pulse 80  Resp 14  Wt 163 lb (73.936 kg)  LMP 11/07/2015    General appearance: alert, she won't answer questions or  speak, just stares or looks off. Gets teary at times. She followed commands, agreed to exam Abdomen: soft, non-tender; bowel sounds normal; no masses,  no organomegaly  Pelvic: External genitalia:  no lesions, no erythema, no lacerations, no bruising              Urethra:  normal appearing urethra with no masses, tenderness or lesions              Bartholins and Skenes: normal                 Vagina: normal appearing vagina with normal color and discharge, no lesions              Cervix: not seen, speculum only partially in the vagina, the patient seemed uncomfortable so I stopped. No CMT.               Bimanual Exam:  Uterus:  normal size, contour, position, consistency, mobility, non-tender               Adnexa: no mass, fullness, tenderness              Chaperone was present for exam.  ASSESSMENT Psychiatric illness, just hospitalized for 10 days with major depression with psychosis. Not sure what happened in the hospital. She was released without medication. Mother is very worried. Denies suicidal thoughts, no further responses. No obvious signs of physical trauma. Negative urine dip.    PLAN I have called Dr Nolen Mu in psychiatry, she is going to try and get her into see a therapist I've given the patient's mother the 24 hour crisis hotline and the # for [facility] I've given the # of a primary care MD, Dr Sharee Holster, she was helpful in giving me contact information I have a call into another contact for more assistance   An After Visit Summary was printed and given to the patient.  Over 1 hour was spent with the patient, her mother and with coordination of care

## 2015-11-15 ENCOUNTER — Telehealth: Payer: Self-pay | Admitting: Obstetrics and Gynecology

## 2015-11-15 LAB — URINALYSIS, MICROSCOPIC ONLY
Bacteria, UA: NONE SEEN [HPF]
Casts: NONE SEEN [LPF]
Crystals: NONE SEEN [HPF]
Yeast: NONE SEEN [HPF]

## 2015-11-15 LAB — WET PREP BY MOLECULAR PROBE
Candida species: NEGATIVE
Gardnerella vaginalis: NEGATIVE
Trichomonas vaginosis: NEGATIVE

## 2015-11-15 NOTE — Progress Notes (Signed)
Patient ID: Theresa [name], female   DOB: [birth year], 20 y.o.   MRN: [id number] GYNECOLOGY  VISIT   HPI: 20 y.o.   Single  Caucasian  female   G0P0000 with Patient's last menstrual period was 11/07/2015.   here for possible urinary symptoms. Mom says she is having a hard time urinating. The patient was recently released from [facility] after a 10 day stay. She won't speak. Mom thinks she is having trouble sitting, thinks it hurts to void, patient won't respond. Mother is very worried.  She shakes her head no to being suicidal. Otherwise won't respond to any questions.   GYNECOLOGIC HISTORY: Patient's last menstrual period was 11/07/2015. Contraception:none Menopausal hormone therapy: none        OB History    Gravida Para Term Preterm AB TAB SAB Ectopic Multiple Living   0 0 0 0 0 0 0 0 0 0          Patient Active Problem List   Diagnosis Date Noted  . MDD (major depressive disorder), single episode, severe with psychotic features (HCC) 11/04/2015  . Paranoia (HCC)   . Migraine without aura     Past Medical History  Diagnosis Date  . Polycystic ovary syndrome   . Abnormal vaginal Pap smear   . STD (sexually transmitted disease) 10-14-15    chlamydia   . Migraine without aura   . Anxiety   . Depression     History reviewed. No pertinent past surgical history.  Current Outpatient Prescriptions  Medication Sig Dispense Refill  . Norgestimate-Ethinyl Estradiol Triphasic (ORTHO TRI-CYCLEN LO) 0.18/0.215/0.25 MG-25 MCG tab Take 1 tablet by mouth daily. (Patient not taking: Reported on 11/14/2015) 3 Package 0   No current facility-administered medications for this visit.     ALLERGIES: Review of patient's allergies indicates no known allergies.  Family History  Problem Relation Age of Onset  . Hypertension Mother   . Heart disease Mother   . Cancer Mother   . Hypertension Other   . Depression Father   . Drug abuse Father   . Bipolar disorder Father   .  Heart disease Maternal Grandmother   . Pancreatitis Maternal Grandfather   . Cancer Paternal Grandmother   . Cancer Paternal Grandfather     Social History   Social History  . Marital Status: Single    Spouse Name: N/A  . Number of Children: N/A  . Years of Education: N/A   Occupational History  . Not on file.   Social History Main Topics  . Smoking status: Current Every Day Smoker -- 0.50 packs/day    Types: Cigarettes  . Smokeless tobacco: Never Used  . Alcohol Use: No  . Drug Use: No  . Sexual Activity:    Partners: Male    Birth Control/ Protection: None   Other Topics Concern  . Not on file   Social History Narrative    Review of Systems  Constitutional: Negative.   HENT: Negative.   Eyes: Negative.   Respiratory: Negative.   Cardiovascular: Negative.   Gastrointestinal: Negative.   Genitourinary:       Unable to urinate  Musculoskeletal: Negative.   Skin: Negative.   Neurological: Negative.   Endo/Heme/Allergies: Negative.   Psychiatric/Behavioral: The patient is nervous/anxious.     PHYSICAL EXAMINATION:    BP 126/70 mmHg  Pulse 80  Resp 14  Wt 163 lb (73.936 kg)  LMP 11/07/2015    General appearance: alert, she won't answer questions or  speak, just stares or looks off. Gets teary at times. She followed commands, agreed to exam Abdomen: soft, non-tender; bowel sounds normal; no masses,  no organomegaly  Pelvic: External genitalia:  no lesions, no erythema, no lacerations, no bruising              Urethra:  normal appearing urethra with no masses, tenderness or lesions              Bartholins and Skenes: normal                 Vagina: normal appearing vagina with normal color and discharge, no lesions              Cervix: not seen, speculum only partially in the vagina, the patient seemed uncomfortable so I stopped. No CMT.               Bimanual Exam:  Uterus:  normal size, contour, position, consistency, mobility, non-tender               Adnexa: no mass, fullness, tenderness              Chaperone was present for exam.  ASSESSMENT Psychiatric illness, just hospitalized for 10 days with major depression with psychosis. Not sure what happened in the hospital. She was released without medication. Mother is very worried. Denies suicidal thoughts, no further responses. No obvious signs of physical trauma. Negative urine dip.    PLAN I have called Dr McKinney in psychiatry, she is going to try and get her into see a therapist I've given the patient's mother the 24 hour crisis hotline and the # for [facility] I've given the # of a primary care MD, Dr Opalski, she was helpful in giving me contact information I have a call into another contact for more assistance   An After Visit Summary was printed and given to the patient.  Over 1 hour was spent with the patient, her mother and with coordination of care  

## 2015-11-15 NOTE — Telephone Encounter (Signed)
Dr Nolen MuMcKinney called back, she has arranged for a therapist, Theresa [name] to see Theresa [name] today or Friday. I called her mother this morning and gave her the contact # [phone])

## 2015-11-16 LAB — URINE CULTURE: Colony Count: 2000

## 2015-11-22 ENCOUNTER — Telehealth: Payer: Self-pay | Admitting: *Deleted

## 2015-11-22 ENCOUNTER — Telehealth: Payer: Self-pay | Admitting: Obstetrics and Gynecology

## 2015-11-22 DIAGNOSIS — F323 Major depressive disorder, single episode, severe with psychotic features: Secondary | ICD-10-CM

## 2015-11-22 NOTE — Telephone Encounter (Signed)
Patient's mom Gavin PoundDeborah is returning a  call to CalpellaElaine.

## 2015-11-22 NOTE — Telephone Encounter (Signed)
Spoke with patient's mother. [name] did see the therapists, Kendal HymenBonnie on Friday. She felt that she was very nice and helpful. [name] is now eating but still not talking.

## 2015-11-22 NOTE — Telephone Encounter (Signed)
Per Dr. Salli QuarryJertson's request. I called and left a message for a return call,  following up on Alleyah from her last office visit. Consuella LoseElaine

## 2015-11-30 ENCOUNTER — Ambulatory Visit: Payer: Commercial Managed Care - HMO | Admitting: Obstetrics and Gynecology

## 2015-11-30 NOTE — Telephone Encounter (Signed)
Behavioral Health and Psychiatry do not take referrals - they always prefer that pt call rather than a referral to make sure that the patient wants to be there.  I'm happy to place a referral anyway and have done so but pt can just call and schedule an appt herself.    You can call the 24-hour [facility] HelpLine at [phone] or [phone] for immediate assistance.   You can schedule an assessment by calling the above numbers.

## 2015-11-30 NOTE — Telephone Encounter (Signed)
Patient's mother called in regards to her daughter. She states that her daughter has not been her normal self since she has returned home from a behavior health clinic [place]. She would like a referral to [facility] in [place]. Please advise.

## 2015-12-01 ENCOUNTER — Ambulatory Visit (HOSPITAL_COMMUNITY)
Admission: AD | Admit: 2015-12-01 | Discharge: 2015-12-01 | Disposition: A | Discharge status: Home / Self Care | Payer: 59 | Source: Intra-hospital | Attending: Psychiatry | Admitting: Psychiatry

## 2015-12-01 DIAGNOSIS — E282 Polycystic ovarian syndrome: Secondary | ICD-10-CM | POA: Diagnosis not present

## 2015-12-01 DIAGNOSIS — F419 Anxiety disorder, unspecified: Secondary | ICD-10-CM | POA: Diagnosis not present

## 2015-12-01 DIAGNOSIS — Z813 Family history of other psychoactive substance abuse and dependence: Secondary | ICD-10-CM | POA: Diagnosis not present

## 2015-12-01 DIAGNOSIS — F329 Major depressive disorder, single episode, unspecified: Secondary | ICD-10-CM | POA: Diagnosis not present

## 2015-12-01 DIAGNOSIS — A749 Chlamydial infection, unspecified: Secondary | ICD-10-CM | POA: Diagnosis not present

## 2015-12-01 DIAGNOSIS — Z818 Family history of other mental and behavioral disorders: Secondary | ICD-10-CM | POA: Insufficient documentation

## 2015-12-01 DIAGNOSIS — F251 Schizoaffective disorder, depressive type: Secondary | ICD-10-CM | POA: Diagnosis present

## 2015-12-01 DIAGNOSIS — G43009 Migraine without aura, not intractable, without status migrainosus: Secondary | ICD-10-CM | POA: Diagnosis not present

## 2015-12-01 NOTE — Telephone Encounter (Signed)
Spoke with mom, she states on her father's side the family members had the same thing happen to them. There is family history of mood disorder, schizophrenia. She thinks her daughter was traumatized [facility] and they sent her home without medication. She is hearing voices but she is eating well. [facility] stated she needs a referral. I will have the referral dept send it there. Sever panic attacks, she is also seeing things.

## 2015-12-01 NOTE — Telephone Encounter (Signed)
I meant please let mom know about walk-in outpatient crisis services avail at [facility] if they are not able to get in to [facility].    Also looks like their gynecologist Dr. Oscar LaJertson had discussed pt's case with Dr. Nolen MuMcKinney who worked in pt emergently to see therapist prior so could also try calling Dr. Nolen MuMcKinney for an appt at:  Charlies SilversParish A. McKinney, MD, PA [address], [address] [place], [place] [address] Phone: [phone]
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
## 2015-12-01 NOTE — Telephone Encounter (Signed)
Referral was placed - it should go through electronically so mom should be able to call there and sched asap instead of waiting for them to call.  Please let mwalk-in crisis services available at: [facility] [address] [place] [place], [place] [address] [phone]  Hour of operations: Monday-Friday, 8:30-5 p.m.  They take medicare/medicaid and the patient can contact [facility] referral department at [phone] or [email or web address] for more information.

## 2015-12-02 NOTE — Telephone Encounter (Signed)
Yes, she [facility] and the patient refused to take any medication orally. I will check on mom to get an update on Monday.

## 2015-12-03 ENCOUNTER — Emergency Department (HOSPITAL_COMMUNITY)
Admission: EM | Admit: 2015-12-03 | Discharge: 2015-12-04 | Discharge status: Against Medical Advice | Payer: Commercial Managed Care - HMO | Attending: Emergency Medicine | Admitting: Emergency Medicine

## 2015-12-03 ENCOUNTER — Encounter (HOSPITAL_COMMUNITY): Payer: Self-pay

## 2015-12-03 DIAGNOSIS — F17210 Nicotine dependence, cigarettes, uncomplicated: Secondary | ICD-10-CM | POA: Insufficient documentation

## 2015-12-03 DIAGNOSIS — F419 Anxiety disorder, unspecified: Secondary | ICD-10-CM | POA: Insufficient documentation

## 2015-12-03 DIAGNOSIS — F410 Panic disorder [episodic paroxysmal anxiety] without agoraphobia: Secondary | ICD-10-CM | POA: Diagnosis not present

## 2015-12-03 DIAGNOSIS — R4182 Altered mental status, unspecified: Secondary | ICD-10-CM | POA: Diagnosis present

## 2015-12-03 DIAGNOSIS — F22 Delusional disorders: Secondary | ICD-10-CM | POA: Insufficient documentation

## 2015-12-03 DIAGNOSIS — F329 Major depressive disorder, single episode, unspecified: Secondary | ICD-10-CM | POA: Insufficient documentation

## 2015-12-03 DIAGNOSIS — Z793 Long term (current) use of hormonal contraceptives: Secondary | ICD-10-CM | POA: Insufficient documentation

## 2015-12-03 NOTE — ED Notes (Signed)
Was seen by [facility] this past and [facility].  Per mother I just want her to stay in the hospital in a regular bed and get placed on Seroquel and she would be fine per mother.  Patient is having lower abdominal pain.
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
## 2015-12-03 NOTE — ED Notes (Signed)
Dr Knapp in to assess 
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
## 2015-12-03 NOTE — ED Notes (Signed)
Theresa [name] was fine on 10/31/2015 and she went to see her OBGYN for PCOS and they put her on Celexa, on 11/03/2015.  She had a panic attack an hour later, called EMS, and took [facility] to [facility].  She was placed into behavioral health and sent to [facility].  She was not eating and was [facility].  When I (MOM) [facility] she was in a diaper, laying in her own menstrual period.  She had not been cleaned up.  She has not had a medical diagnosis.  I feel like something is [facility] at [facility].  Since the 19th she has started eating.  She has been depressed, sometimes she is laughing, not suicidal.  Seems disoriented and confused. Mother states that her grandmother on her father's side, aunt, uncle, and her brother have had the same things.  Was all placed on Seroquel.

## 2015-12-04 ENCOUNTER — Telehealth: Payer: Self-pay

## 2015-12-04 LAB — RAPID URINE DRUG SCREEN, HOSP PERFORMED
Amphetamines: NOT DETECTED
Barbiturates: NOT DETECTED
Benzodiazepines: NOT DETECTED
Cocaine: NOT DETECTED
Opiates: NOT DETECTED
Tetrahydrocannabinol: POSITIVE — AB

## 2015-12-04 LAB — PREGNANCY, URINE: Preg Test, Ur: NEGATIVE

## 2015-12-04 NOTE — ED Notes (Signed)
Mother irate, at desk with raised voice stating that she does not want to pay this bill if her daughter will not be given a script for seroquel. "I dont know why we're here if she isn't going to get medicine to help her. What do I have to do to get her help. This has been going on for 30 days". Per mother

## 2015-12-04 NOTE — Telephone Encounter (Signed)
I already put in referral on 7/6 to [facility]

## 2015-12-04 NOTE — Telephone Encounter (Signed)
Thanks so much for all of the follow-up on this. [facility] an [place] only facility [place] on [address]  But thanks for following up on this. It looks like they have several points of contact - has seen her gynecologist and been to the ER so are aware of their resources.

## 2015-12-04 NOTE — ED Notes (Signed)
Pt standing at nurses station - does not respond to questions. Her mother comes to station and states she has been trying to get her daughter on antipsychotics for over a month now and has not been able to, has not gotten a referral for out patient and will lose her job of 11 years tomorrow should she not come to work. She reports she is frightened to go to work and leave her daughter

## 2015-12-04 NOTE — Telephone Encounter (Signed)
[name] [place] will see the patient.    Her brother also goes there.   She needs a referral for behavior.  She was at [facility]    [name] [name]:  [phone]

## 2015-12-04 NOTE — Telephone Encounter (Signed)
Dr. Clelia CroftShaw, can we put referral in your name.

## 2015-12-04 NOTE — [facility] Counselor (Signed)
Called ED to begin assessment and nurse advised that when mother took pt by the arm out of the ED.  Nurse sts she advised mother that assessment was about to begin but mother took her regardless.   Beryle FlockMary Alyda Megna, MS, CRC, [facility] Triage Specialist [facility]

## 2015-12-04 NOTE — ED Notes (Signed)
Pt and mother to exit - mother is unhappy

## 2015-12-05 ENCOUNTER — Emergency Department (HOSPITAL_COMMUNITY)
Admission: EM | Admit: 2015-12-05 | Discharge: 2015-12-05 | Disposition: A | Discharge status: Other Acute Inpatient Hospital | Payer: Commercial Managed Care - HMO | Attending: Emergency Medicine | Admitting: Emergency Medicine

## 2015-12-05 ENCOUNTER — Encounter (HOSPITAL_COMMUNITY): Payer: Self-pay | Admitting: Emergency Medicine

## 2015-12-05 ENCOUNTER — Inpatient Hospital Stay
Admission: RE | Admit: 2015-12-05 | Discharge: 2015-12-13 | DRG: 885 | Disposition: A | Discharge status: Home / Self Care | Payer: 59 | Source: Intra-hospital | Attending: Psychiatry | Admitting: Psychiatry

## 2015-12-05 DIAGNOSIS — F17210 Nicotine dependence, cigarettes, uncomplicated: Secondary | ICD-10-CM | POA: Diagnosis present

## 2015-12-05 DIAGNOSIS — F2089 Other schizophrenia: Secondary | ICD-10-CM | POA: Diagnosis not present

## 2015-12-05 DIAGNOSIS — G43009 Migraine without aura, not intractable, without status migrainosus: Secondary | ICD-10-CM | POA: Diagnosis present

## 2015-12-05 DIAGNOSIS — Z818 Family history of other mental and behavioral disorders: Secondary | ICD-10-CM

## 2015-12-05 DIAGNOSIS — F329 Major depressive disorder, single episode, unspecified: Secondary | ICD-10-CM | POA: Diagnosis not present

## 2015-12-05 DIAGNOSIS — F17200 Nicotine dependence, unspecified, uncomplicated: Secondary | ICD-10-CM | POA: Diagnosis present

## 2015-12-05 DIAGNOSIS — Z809 Family history of malignant neoplasm, unspecified: Secondary | ICD-10-CM

## 2015-12-05 DIAGNOSIS — G4700 Insomnia, unspecified: Secondary | ICD-10-CM | POA: Diagnosis present

## 2015-12-05 DIAGNOSIS — Z79899 Other long term (current) drug therapy: Secondary | ICD-10-CM

## 2015-12-05 DIAGNOSIS — F3160 Bipolar disorder, current episode mixed, unspecified: Principal | ICD-10-CM | POA: Diagnosis present

## 2015-12-05 DIAGNOSIS — Z8249 Family history of ischemic heart disease and other diseases of the circulatory system: Secondary | ICD-10-CM | POA: Diagnosis not present

## 2015-12-05 DIAGNOSIS — F1220 Cannabis dependence, uncomplicated: Secondary | ICD-10-CM | POA: Diagnosis present

## 2015-12-05 DIAGNOSIS — F29 Unspecified psychosis not due to a substance or known physiological condition: Secondary | ICD-10-CM | POA: Diagnosis not present

## 2015-12-05 DIAGNOSIS — F061 Catatonic disorder due to known physiological condition: Secondary | ICD-10-CM | POA: Diagnosis present

## 2015-12-05 DIAGNOSIS — Z046 Encounter for general psychiatric examination, requested by authority: Secondary | ICD-10-CM | POA: Diagnosis present

## 2015-12-05 DIAGNOSIS — F209 Schizophrenia, unspecified: Secondary | ICD-10-CM | POA: Diagnosis present

## 2015-12-05 DIAGNOSIS — R4701 Aphasia: Secondary | ICD-10-CM | POA: Diagnosis present

## 2015-12-05 LAB — PREGNANCY, URINE: Preg Test, Ur: NEGATIVE

## 2015-12-05 LAB — URINALYSIS, ROUTINE W REFLEX MICROSCOPIC
Bilirubin Urine: NEGATIVE
Glucose, UA: NEGATIVE mg/dL
Hgb urine dipstick: NEGATIVE
Ketones, ur: NEGATIVE mg/dL
Leukocytes, UA: NEGATIVE
Nitrite: NEGATIVE
Protein, ur: NEGATIVE mg/dL
Specific Gravity, Urine: >1.03 — ABNORMAL HIGH (ref 1.005–1.030)
pH: 6 (ref 5.0–8.0)

## 2015-12-05 LAB — CBC
HCT: 40.4 % (ref 36.0–46.0)
Hemoglobin: 13.8 g/dL (ref 12.0–15.0)
MCH: 30.7 pg (ref 26.0–34.0)
MCHC: 34.2 g/dL (ref 30.0–36.0)
MCV: 89.8 fL (ref 78.0–100.0)
Platelets: 206 10*3/uL (ref 150–400)
RBC: 4.5 MIL/uL (ref 3.87–5.11)
RDW: 12.7 % (ref 11.5–15.5)
WBC: 7.7 10*3/uL (ref 4.0–10.5)

## 2015-12-05 LAB — BASIC METABOLIC PANEL
Anion gap: 7 (ref 5–15)
BUN: 10 mg/dL (ref 6–20)
CO2: 27 mmol/L (ref 22–32)
Calcium: 9.2 mg/dL (ref 8.9–10.3)
Chloride: 103 mmol/L (ref 101–111)
Creatinine, Ser: 0.79 mg/dL (ref 0.44–1.00)
GFR calc Af Amer: >60 mL/min (ref >60)
GFR calc non Af Amer: >60 mL/min (ref >60)
Glucose, Bld: 99 mg/dL (ref 65–99)
Potassium: 3.5 mmol/L (ref 3.5–5.1)
Sodium: 137 mmol/L (ref 135–145)

## 2015-12-05 LAB — RAPID URINE DRUG SCREEN, HOSP PERFORMED
Amphetamines: NOT DETECTED
Barbiturates: NOT DETECTED
Benzodiazepines: NOT DETECTED
Cocaine: NOT DETECTED
Opiates: NOT DETECTED
Tetrahydrocannabinol: POSITIVE — AB

## 2015-12-05 LAB — TSH: TSH: 1.216 u[IU]/mL (ref 0.350–4.500)

## 2015-12-05 MED ORDER — HYDROXYZINE HCL 50 MG PO TABS: 50.0000 mg | ORAL_TABLET | Freq: Three times a day (TID) | ORAL | Status: DC | PRN

## 2015-12-05 MED ORDER — ALUM & MAG HYDROXIDE-SIMETH 200-200-20 MG/5ML PO SUSP: 30.0000 mL | ORAL | Status: DC | PRN

## 2015-12-05 MED ORDER — MAGNESIUM HYDROXIDE 400 MG/5ML PO SUSP: 30.0000 mL | Freq: Every day | ORAL | Status: DC | PRN

## 2015-12-05 MED ORDER — TRAZODONE HCL 50 MG PO TABS
50.0000 mg | ORAL_TABLET | Freq: Every evening | ORAL | Status: DC | PRN
  Filled 2015-12-05: qty 1

## 2015-12-05 MED ORDER — LORAZEPAM 2 MG/ML IJ SOLN: 2.0000 mg | INTRAMUSCULAR | Status: DC | PRN

## 2015-12-05 MED ORDER — ACETAMINOPHEN 325 MG PO TABS
650.0000 mg | ORAL_TABLET | Freq: Four times a day (QID) | ORAL | Status: DC | PRN
  Administered 2015-12-10 – 2015-12-11 (×2): 650 mg via ORAL
  Filled 2015-12-05 (×2): qty 2

## 2015-12-05 MED ORDER — OLANZAPINE 5 MG PO TBDP
20.0000 mg | ORAL_TABLET | Freq: Every day | ORAL | Status: DC
  Administered 2015-12-06: 20 mg via ORAL
  Filled 2015-12-05 (×3): qty 4

## 2015-12-05 MED ORDER — LORAZEPAM 2 MG PO TABS: 2.0000 mg | ORAL_TABLET | ORAL | Status: DC | PRN

## 2015-12-05 MED ORDER — RISPERIDONE 1 MG PO TABS: 1.0000 mg | ORAL_TABLET | Freq: Three times a day (TID) | ORAL | Status: DC | PRN

## 2015-12-05 NOTE — ED Notes (Signed)
Pt shirt, pants and shoes placed in pt belongings bag and locked in locker.
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
## 2015-12-05 NOTE — Progress Notes (Signed)
Patient has been accepted to [facility] pending labs  Accepting, per  Dr. Lucianne MussKumar.  Attending Physician will be Dr. Jennet MaduroPucilowska  Patient has been assigned to room 303, by [facility] Charge Nurse.  Call report to [phone].  Representative at [facility] notified as well as pt. Nurse  Theresa LincolnShean K. Sherlon HandingHarris, LCAS-A, LPC-A, [facility]  Counselor 12/05/2015 11:16 AM

## 2015-12-05 NOTE — ED Notes (Signed)
Meal given

## 2015-12-05 NOTE — ED Notes (Signed)
Attempted to get pt to sign consent to release information so [facility] can speak to pt mother. Pt keeps saying she want her mother here, pt informed that there visitation hours in pace for psych pt & her mother can come to she her then. Pt asked again to sign paperwork to release information. Pt refused.
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
## 2015-12-05 NOTE — BH Assessment (Signed)
Clinician attempted to complete consult and was informed by staff Edson Snowball(Angelina) that pt was currently attempting to elope and with security. Clinician requested consult be re-enteerd when pt able to participate.

## 2015-12-05 NOTE — BH Assessment (Signed)
Per Donell SievertSpencer Simon, PA - patient meets criteria for inpatient hospitalization.  Writer informed the ER MD Dr. Lynelle DoctorKnapp.   [facility](Tori) [facility].  TTS will seek placement.    Per documentation in the epic chart from previous assessments the patient does not want to go back to [facility] due to having a very bed experience there.

## 2015-12-05 NOTE — ED Notes (Signed)
EDP taking out IVC paperwork on pt after attempting to walk out. Pt escorted back to room by security.

## 2015-12-05 NOTE — ED Notes (Signed)
Pt states she has a fear of being hurt. Denies that she is being hurt. Pt informed we needed to collect a urine sample so that we could proceed w/ trying to get her help. Pt states she wants her mom present, told pt I was informed her mother was not coming up tonight. Pt just sitting on the side of the bed, advised we need a urine sample & when she was ready for us to help her let this nurse & we will escort her to the restroom.

## 2015-12-05 NOTE — ED Notes (Signed)
Pt standing in doorway just gazing around.

## 2015-12-05 NOTE — Progress Notes (Signed)
Patient is sad and with a flat affect but cooperative during admission assessment. Patient denies SI/HI at this time. Patient denies AVH. Patient informed of fall risk status, fall risk assessed "low" at this time. Patient oriented to unit/staff/room. Patient denies any questions/concerns at this time. Patient safe on unit with Q15 minute checks for safety. Skin assessment & body search done.No contraband found.

## 2015-12-05 NOTE — ED Notes (Signed)
Pt given meal tray.  Pt continues to stare out towards nurses station.

## 2015-12-05 NOTE — ED Notes (Signed)
Per mom pt is paranoid, not sleeping and hearing voices since June 9th when starting celexa. Mom states pt has not been herself since being discharged from [facility]. Pt denies any si/hi ideations.

## 2015-12-05 NOTE — ED Notes (Signed)
Pt began wandering toward exit of ED.  Pt redirected back into room.  Security called.

## 2015-12-05 NOTE — ED Notes (Signed)
Pt walked out of room when asked what was wrong pt did not respond. Pt again asked what she needed, refused to answer. Security arrived in the ER. Pt states no one has tried to help her know this is happening. Pt asked several times to return to her room, she just stood in the hallway looking around. Pt was escorted by security and this nurse back into the room.  Pt reminded that this nurse sat in the room w/ her & asked questions about what was going on w/ her & she refused to answer. Pt tearful asking for her mother, pt informed we have visiting hours in which she will be allowed to visit.

## 2015-12-05 NOTE — ED Provider Notes (Signed)
Accepted to [facility] by Dr. Lucianne MussKumar. Chart reviewed, vital signs reviewed. Blood work one day ago reviewed. Medically cleared for psych treatment  Lavera Guiseana Duo Markee Matera, MD 12/05/15 1142

## 2015-12-05 NOTE — Progress Notes (Addendum)
Per Dr. Ardyth HarpsHernandez, pt. Needs labs for CVC, Metabolic Panel, PSH, and UA prior to admission at [facility]. Sp[facility]. [facility] and informed her states she will have labs ordered.  Dondi Burandt K. Sherlon HandingHarris, LCAS-A, LPC-A, [facility]  Counselor 12/05/2015 10:24 AM

## 2015-12-05 NOTE — BH Assessment (Addendum)
Tele Assessment Note   Theresa [name] is a 20 year old white female that is responding to internal stimuli throughout the assessment.  Patient was hospitalized at [facility] in June 2017 due to similar symptoms.  During the assessment the patient was very suspicious of the electronic devices in the emergency room.  The ER MD IVC'd the patient due to her erratic behaviors.   Patient is a poor historian.  Patient has delayed reactions when answering questions.  Patient is fixated on having her mother in the assessment with her. However, the patient came to the ED by EMS and her mother did not come with her.  The patient is adamant that her mother is in the waiting room and the ED staff will not allow her mother to come into the room with her.  Writer attempted to obtain collateral information from the mother [name] [phone]) however, the mother did not answer the phone.    Documentation in the epic chart from Dr. Lynelle Doctor received from the patient mother reports that the patient has been experiencing severe panic attacks.   The patient feels that people have put cameras in her eyes and put recording devices in her ears.   Patient is not bather or clothing herself; her mother states that the patient was trembling when going to the bathroom and she was afraid to pull her pants down.   When asked about physical sexual or emotional abuse during the assessment the patient starred at the wall with an emotionless blank stare and said, "no".  Documentation in the epic chart from Dr. Lynelle Doctor received from the patient mother reports that the patient no longer knows how to    Diagnosis: Schizophrenia    Past Medical History:  Past Medical History  Diagnosis Date  . Polycystic ovary syndrome   . Abnormal vaginal Pap smear   . STD (sexually transmitted disease) 10-14-15    chlamydia   . Migraine without aura   . Anxiety   . Depression     History reviewed. No pertinent past surgical  history.  Family History:  Family History  Problem Relation Age of Onset  . Hypertension Mother   . Heart disease Mother   . Cancer Mother   . Hypertension Other   . Depression Father   . Drug abuse Father   . Bipolar disorder Father   . Heart disease Maternal Grandmother   . Pancreatitis Maternal Grandfather   . Cancer Paternal Grandmother   . Cancer Paternal Grandfather     Social History:  reports that she has been smoking Cigarettes.  She has been smoking about 0.50 packs per day. She has never used smokeless tobacco. She reports that she does not drink alcohol or use illicit drugs.  Additional Social History:  Alcohol / Drug Use History of alcohol / drug use?: No history of alcohol / drug abuse  CIWA: CIWA-Ar BP: 108/73 mmHg Pulse Rate: 75 COWS:    PATIENT STRENGTHS: (choose at least two) Average or above average intelligence Physical Health Supportive family/friends Work skills  Allergies: No Known Allergies  Home Medications:  (Not in a hospital admission)  OB/GYN Status:  Patient's last menstrual period was 11/07/2015.  General Assessment Data Location of Assessment: [facility] TTS Assessment: In system Is this a Tele or Face-to-Face Assessment?: Tele Assessment Is this an Initial Assessment or a Re-assessment for this encounter?: Initial Assessment Marital status: Single Maiden name: NA Is patient pregnant?: No Pregnancy Status: No Living Arrangements: Parent (  Lives with her mother.) Can pt return to current living arrangement?: Yes Admission Status: Involuntary Is patient capable of signing voluntary admission?: Yes Referral Source: Self/Family/Friend [facility]     Crisis Care Plan Living Arrangements: Parent (Lives with her mother.) Legal Guardian:  (NA) Name of Psychiatrist: None Reported Name of Therapist: None Reported  Education Status Is patient currently in school?: No Current Grade: NA Highest grade of school patient has  completed: GED Name of school: NA Contact person: NA  Risk to self with the past 6 months Suicidal Ideation: No Has patient been a risk to self within the past 6 months prior to admission? : No Suicidal Intent: No Has patient had any suicidal intent within the past 6 months prior to admission? : No Is patient at risk for suicide?: No Suicidal Plan?: No Has patient had any suicidal plan within the past 6 months prior to admission? : No Access to Means: No What has been your use of drugs/alcohol within the last 12 months?: Patient denies Previous Attempts/Gestures: No How many times?: 0 Other Self Harm Risks: None Reported Triggers for Past Attempts: None known Intentional Self Injurious Behavior: None Family Suicide History: Yes Recent stressful life event(s): Job Loss, Financial Problems Persecutory voices/beliefs?: Yes Depression: Yes Depression Symptoms: Despondent, Insomnia, Tearfulness, Loss of interest in usual pleasures, Feeling worthless/self pity, Isolating, Fatigue, Feeling angry/irritable Substance abuse history and/or treatment for substance abuse?: No Suicide prevention information given to non-admitted patients: Not applicable  Risk to Others within the past 6 months Homicidal Ideation: No Does patient have any lifetime risk of violence toward others beyond the six months prior to admission? : No Thoughts of Harm to Others: No Current Homicidal Intent: No Current Homicidal Plan: No Access to Homicidal Means: No Identified Victim: None Reported History of harm to others?: No Assessment of Violence: None Noted Violent Behavior Description: NA Does patient have access to weapons?: No Criminal Charges Pending?: No Does patient have a court date: No Is patient on probation?: No  Psychosis Hallucinations: Auditory, Visual Delusions: Persecutory  Mental Status Report Appearance/Hygiene: Unremarkable Eye Contact: Poor Motor Activity: Freedom of movement,  Restlessness, Agitation Speech: Soft, Slow, Tangential Level of Consciousness: Restless, Alert, Drowsy Mood: Anxious, Suspicious, Despair, Irritable, Worthless, low self-esteem Affect: Flat Anxiety Level: Minimal Thought Processes: Flight of Ideas, Thought Blocking Judgement: Impaired Orientation: Person, Place, Time, Situation Obsessive Compulsive Thoughts/Behaviors: None  Cognitive Functioning Concentration: Decreased Memory: Recent Impaired, Remote Impaired IQ: Average Insight: Poor Impulse Control: Poor Appetite: Fair Weight Loss: 0 Weight Gain: 0 Sleep: Unable to Assess Total Hours of Sleep:  (Refused to answer the question ) Vegetative Symptoms: Decreased grooming, Staying in bed  ADLScreening [facility] Assessment Services) Patient's cognitive ability adequate to safely complete daily activities?: Yes Patient able to express need for assistance with ADLs?: Yes Independently performs ADLs?: Yes (appropriate for developmental age)  Prior Inpatient Therapy Prior Inpatient Therapy: Yes Prior Therapy Dates: 6.2017 [facility] Reason for Treatment: AVH, paranoia,   Prior Outpatient Therapy Prior Outpatient Therapy: No Prior Therapy Dates: NA Prior Therapy Facilty/Provider(s): NA Reason for Treatment: NA Does patient have an ACCT team?: No Does patient have Intensive In-House Services?  : No Does patient have [facility] services? : No Does patient have P4CC services?: No  ADL Screening (condition at time of admission) Patient's cognitive ability adequate to safely complete daily activities?: Yes Is the patient deaf or have difficulty hearing?: No Does the patient have difficulty seeing, even when wearing glasses/contacts?:  No Does the patient have difficulty concentrating, remembering, or making decisions?: Yes Patient able to express need for assistance with ADLs?: Yes Does the patient have difficulty dressing or bathing?: No Independently  performs ADLs?: Yes (appropriate for developmental age) Does the patient have difficulty walking or climbing stairs?: No Weakness of Legs: None Weakness of Arms/Hands: None  Home Assistive Devices/Equipment Home Assistive Devices/Equipment: None    Abuse/Neglect Assessment (Assessment to be complete while patient is alone) Physical Abuse: Denies Verbal Abuse: Denies Sexual Abuse: Denies Exploitation of patient/patient's resources: Denies Self-Neglect: Denies Values / Beliefs Cultural Requests During Hospitalization: None Spiritual Requests During Hospitalization: None Consults Spiritual Care Consult Needed: No Social Work Consult Needed: No Merchant navy officer (For Healthcare) Does patient have an advance directive?: No Would patient like information on creating an advanced directive?: No - patient declined information    Additional Information 1:1 In Past 12 Months?: No CIRT Risk: No Elopement Risk: No Does patient have medical clearance?: No     Disposition: Per Donell Sievert, PA - patient meets criteria for inpatient hospitalization.  Disposition Initial Assessment Completed for this Encounter: Yes  Phillip Heal LaVerne 12/05/2015 4:56 AM

## 2015-12-06 ENCOUNTER — Other Ambulatory Visit: Payer: Self-pay

## 2015-12-06 DIAGNOSIS — F2089 Other schizophrenia: Secondary | ICD-10-CM

## 2015-12-06 DIAGNOSIS — F1220 Cannabis dependence, uncomplicated: Secondary | ICD-10-CM | POA: Diagnosis present

## 2015-12-06 DIAGNOSIS — F29 Unspecified psychosis not due to a substance or known physiological condition: Secondary | ICD-10-CM

## 2015-12-06 DIAGNOSIS — F32A Depression, unspecified: Secondary | ICD-10-CM

## 2015-12-06 DIAGNOSIS — F329 Major depressive disorder, single episode, unspecified: Secondary | ICD-10-CM

## 2015-12-06 MED ORDER — HALOPERIDOL LACTATE 5 MG/ML IJ SOLN
5.0000 mg | Freq: Two times a day (BID) | INTRAMUSCULAR | Status: DC
  Administered 2015-12-07 – 2015-12-08 (×2): 5 mg via INTRAMUSCULAR
  Filled 2015-12-06 (×3): qty 1

## 2015-12-06 MED ORDER — DIPHENHYDRAMINE HCL 25 MG PO CAPS: 25.0000 mg | ORAL_CAPSULE | Freq: Four times a day (QID) | ORAL | Status: DC | PRN

## 2015-12-06 MED ORDER — OLANZAPINE 5 MG PO TBDP: 10.0000 mg | ORAL_TABLET | Freq: Every day | ORAL | Status: DC

## 2015-12-06 MED ORDER — LORAZEPAM 2 MG PO TABS: 2.0000 mg | ORAL_TABLET | Freq: Four times a day (QID) | ORAL | Status: DC

## 2015-12-06 MED ORDER — HALOPERIDOL 5 MG PO TABS
5.0000 mg | ORAL_TABLET | Freq: Two times a day (BID) | ORAL | Status: DC
  Administered 2015-12-06 – 2015-12-07 (×3): 5 mg via ORAL
  Filled 2015-12-06 (×4): qty 1

## 2015-12-06 MED ORDER — DIPHENHYDRAMINE HCL 50 MG/ML IJ SOLN: 25.0000 mg | Freq: Four times a day (QID) | INTRAMUSCULAR | Status: DC | PRN

## 2015-12-06 MED ORDER — LORAZEPAM 2 MG PO TABS
2.0000 mg | ORAL_TABLET | Freq: Four times a day (QID) | ORAL | Status: DC
  Administered 2015-12-06 (×3): 2 mg via ORAL
  Filled 2015-12-06 (×3): qty 1

## 2015-12-06 MED ORDER — LORAZEPAM 2 MG/ML IJ SOLN: 2.0000 mg | Freq: Four times a day (QID) | INTRAMUSCULAR | Status: DC

## 2015-12-06 MED ORDER — ENSURE ENLIVE PO LIQD
237.0000 mL | Freq: Three times a day (TID) | ORAL | Status: DC
  Administered 2015-12-06 – 2015-12-12 (×11): 237 mL via ORAL

## 2015-12-06 MED ORDER — LORAZEPAM 2 MG/ML IJ SOLN
2.0000 mg | Freq: Four times a day (QID) | INTRAMUSCULAR | Status: DC
  Filled 2015-12-06 (×2): qty 1

## 2015-12-06 NOTE — Progress Notes (Signed)
Patient refused Ativan 2 mg po. She was asleep when nurse approached her about taking her po medications.  She was difficult to rouse.  Pulled up 2 mg ativan IM and went into room with other staff present.  Patient did not display any agitation.  Nurse was able to administer injection without patient being held down.  Injection was given and patient proceeded to go back to sleep.

## 2015-12-06 NOTE — Progress Notes (Signed)
Patient is standing in front of the nurse's station since 8 am for 3hrs then moved in front of the day room.She remained mute and nodded her head for taking her medicine.Chair & fluids offered to patient but she remained catatonic.

## 2015-12-06 NOTE — [facility] Suicide Risk Assessment (Signed)
[facility] Admission Suicide Risk Assessment   Nursing information obtained from:  Patient Demographic factors:  Caucasian Current Mental Status:  NA Loss Factors:  NA Historical Factors:  NA Risk Reduction Factors:  Living with another person, especially a relative  Total Time spent with patient: 1 hour Principal Problem: Schizophrenia spectrum disorder with psychotic disorder type not yet determined (HCC) Diagnosis:   Patient Active Problem List   Diagnosis Date Noted  . Cannabis use disorder, moderate, dependence (HCC) [F12.20] 12/06/2015  . Schizophrenia spectrum disorder with psychotic disorder type not yet determined (HCC) [F20.89, F29] 12/05/2015  . Tobacco use disorder [F17.200] 12/05/2015  . 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 Migraine without aura [G43.009]    Subjective Data: New onset psychosis.  Continued Clinical Symptoms:  Alcohol Use Disorder Identification Test Final Score (AUDIT): 0 The "Alcohol Use Disorders Identification Test", Guidelines for Use in Primary Care, Second Edition.  World Science writer [facility]). Score between 0-7:  no or low risk or alcohol related problems. Score between 8-15:  moderate risk of alcohol related problems. Score between 16-19:  high risk of alcohol related problems. Score 20 or above:  warrants further diagnostic evaluation for alcohol dependence and treatment.   CLINICAL FACTORS:   Severe Anxiety and/or Agitation Schizophrenia:   Less than 20 years old Paranoid or undifferentiated type   Musculoskeletal: Strength & Muscle Tone: within normal limits Gait & Station: normal Patient leans: N/A  Psychiatric Specialty Exam: Physical Exam  Nursing note and vitals reviewed.   Review of Systems  Unable to perform ROS: mental acuity    Blood pressure 126/71, pulse 92, temperature 98.2 F (36.8 C), temperature source Oral, resp. rate 18, height  (1.6 m),  weight 72.576 kg (160 lb), last menstrual period 11/07/2015, SpO2 99 %.Body mass index is 28.35 kg/(m^2).  General Appearance: Casual  Eye Contact:  Minimal  Speech:  Blocked  Volume:  Decreased  Mood:  Anxious  Affect:  Blunt  Thought Process:  Disorganized  Orientation:  Other:  Unable to assess  Thought Content:  Unable to assess  Suicidal Thoughts:  Unable to assess  Homicidal Thoughts:  Unable to assess  Memory:  Immediate;   Unable to assess Recent;   Unable to assess Remote;   Unable to assess  Judgement:  Poor  Insight:  Lacking  Psychomotor Activity:  Decreased  Concentration:  Concentration: Unable to assess and Attention Span: Unable to assess  Recall:  Poor  Fund of Knowledge:  Unable to assess  Language:  Poor  Akathisia:  No  Handed:  Right  AIMS (if indicated):     Assets:  Physical Health  ADL's:  Impaired  Cognition:  Impaired,  Moderate  Sleep:  Number of Hours: 4      COGNITIVE FEATURES THAT CONTRIBUTE TO RISK:  None    SUICIDE RISK:   Moderate:  Frequent suicidal ideation with limited intensity, and duration, some specificity in terms of plans, no associated intent, good self-control, limited dysphoria/symptomatology, some risk factors present, and identifiable protective factors, including available and accessible social support.  PLAN OF CARE:   Ms. 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  1. Catatonia. 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 The patient refuses medications. Forced medication orders will be placed.  2. Psychosis. Because of catatonia and Ativan use will not be able to use Zyprexa. We will likely start Haldol.  3. Social. Her mother applied for  the guardianship. Guardian ad litem has been calling our Child psychotherapistsocial worker.  4. Smoking. Nicotine patch is available.  5. We'll oral intake. Paranoid tray and Ensure is available.   6. Cannabis abuse. We'll address substance abuse when patient is better.  7.  Insomnia. She is on trazodone.   8. Disposition. She will be discharged to home with her mother. She will follow up with a local provider.  I certify that inpatient services furnished can reasonably be expected to improve the patient's condition.   Kristine LineaJolanta Ryiah Bellissimo, MD 12/06/2015, 1:17 PM

## 2015-12-06 NOTE — Plan of Care (Signed)
Problem: Coping: Goal: Ability to verbalize frustrations and anger appropriately will improve Outcome: Not Met (add Reason) Pt is irritable and uncooperative with care. Patient refused po bedtime medications. Refused group. Denies SI/HI. No c/o pain/discomfort noted.

## 2015-12-06 NOTE — Tx Team (Signed)
Initial Interdisciplinary Treatment Plan   PATIENT STRESSORS: Health problems Medication change or noncompliance   PATIENT STRENGTHS: Average or above average intelligence Supportive family/friends   PROBLEM LIST: Problem List/Patient Goals Date to be addressed Date deferred Reason deferred Estimated date of resolution  Catatonic 12/06/2015     Psychosis 12/06/2015                                                DISCHARGE CRITERIA:  Adequate post-discharge living arrangements Medical problems require only outpatient monitoring  PRELIMINARY DISCHARGE PLAN: Attend PHP/IOP Return to previous living arrangement  PATIENT/FAMIILY INVOLVEMENT: This treatment plan has been presented to and reviewed with the patient, Theresa [name], and/or family member, .  The patient and family have been given the opportunity to ask questions and make suggestions.  Margo CommonGigi [facility] 12/06/2015, 9:36 AM

## 2015-12-06 NOTE — [facility] Group Notes (Signed)
[facility] Group Notes:  (Nursing/MHT/Case Management/Adjunct)  Date:  12/06/2015  Time:  6:13 PM  Type of Therapy:  Psychoeducational Skills  Participation Level:  Did Not Attend  Lynelle SmokeCara Travis [facility] 12/06/2015, 6:13 PM

## 2015-12-06 NOTE — H&P (Addendum)
Psychiatric Admission Assessment Adult  Patient Identification: Theresa [name] MRN:  [id number] Date of Evaluation:  12/06/2015 Chief Complaint:  schizophrenia Principal Diagnosis: Schizophrenia spectrum disorder with psychotic disorder type not yet determined [facility]) Diagnosis:   Patient Active Problem List   Diagnosis Date Noted  . Cannabis use disorder, moderate, dependence ([place]) [F12.20] 12/06/2015  . Schizophrenia spectrum disorder with psychotic disorder type not yet determined ([place]) [F20.89, F29] 12/05/2015  . Tobacco use disorder [F17.200] 12/05/2015  . MDD (major depressive disorder), single episode, severe with psychotic features ([place]) [F32.3] 11/04/2015  . Migraine without aura [G43.009]    History of Present Illness:   Identifying data. [name] is a 20 year old female with new onset psychosis.  Chief complaint. The patient is unable to state.  History of present illness. Information was obtained from the chart and her mother. The patient is catatonic and mute. She is unable to provide any information. She communicates by nodding her head. Apparently there is no history of mental illness but or the past month the patient has been increasingly psychotic and strange. She reported visual and auditory hallucinations to her mother, she believes that there are cameras implanted in her eyes and recording devices in her ears. At times she would not recognize her brother or boyfriend. During guardianship hearing she took the judge for her father. She was laughing and giggling inappropriately. She reported visual hallucinations to her mother as well. The patient stopped taking care of her apartment, showering and eating and became mute. [facility] was [facility] to [facility] or drink. Per Mother's report the patient did not receive any medications at [facility] as she was refusing. She was discharged with no medications. She spent last  few days at her mother's house. She was able to drink and eat a little. The mother reports severe anxiety. In June she was prescribed Prozac for anxiety by her gynecologist. She had major anxiety attack following her first dose and ended up in the emergency room. The patient was positive for cannabis on admission. The family denies any alcohol or substance use.  Past psychiatric history. She was never hospitalized, no suicide attempts no symptoms or treatment up until June 2017 when she received Prozac.  Family psychiatric history. There are multiple family members including her grandmother, her aunt, and her brother who experience psychotic episodes. Her aunt was at some point hospitalized for a month for psychotic symptoms that were eventually helped with Depakote. Multiple family members received treatment with well but some of them were allergic to it as it caused swelling. Her brother who was diagnosed with ADHD responded well to Risperdal during brief psychotic episode. There were reportedly no suicides in the family.  Social history. Prior to her illness, the patient was living independently and had a job at [employer] Lately there was some work related stress as she was asked to do that job of her coworker who was fired. She has a boyfriend. The family attempted to obtain guardianship but the judge rejected their petition.  Total Time spent with patient: 1 hour  Is the patient at risk to self? Yes.    Has the patient been a risk to self in the past 6 months? No.  Has the patient been a risk to self within the distant past? No.  Is the patient a risk to others? No.  Has the patient been a risk to others in the past 6 months? No.  Has the patient been a risk to others within the distant past? No.   Prior Inpatient Therapy:   Prior Outpatient Therapy:    Alcohol Screening: 1. How often do you have a drink containing alcohol?: Never 2. How many drinks containing alcohol do you have on a  typical day when you are drinking?: 1 or 2 3. How often do you have six or more drinks on one occasion?: Never Preliminary Score: 0 4. How often during the last year have you found that you were not able to stop drinking once you had started?: Never 5. How often during the last year have you failed to do what was normally expected from you becasue of drinking?: Never 6. How often during the last year have you needed a first drink in the morning to get yourself going after a heavy drinking session?: Never 7. How often during the last year have you had a feeling of guilt of remorse after drinking?: Never 8. How often during the last year have you been unable to remember what happened the night before because you had been drinking?: Never 9. Have you or someone else been injured as a result of your drinking?: No 10. Has a relative or friend or a doctor or another health worker been concerned about your drinking or suggested you cut down?: No Alcohol Use Disorder Identification Test Final Score (AUDIT): 0 Brief Intervention: AUDIT score less than 7 or less-screening does not suggest unhealthy drinking-brief intervention not indicated Substance Abuse History in the last 12 months:  Yes.   Consequences of Substance Abuse: Negative Previous Psychotropic Medications: Yes  Psychological Evaluations: No  Past Medical History:  Past Medical History  Diagnosis Date  . Polycystic ovary syndrome   . Abnormal vaginal Pap smear   . STD (sexually transmitted disease) 10-14-15    chlamydia   . Migraine without aura   . Anxiety   . Depression    History reviewed. No pertinent past surgical history. Family History:  Family History  Problem Relation Age of Onset  . Hypertension Mother   . Heart disease Mother   . Cancer Mother   . Hypertension Other   . Depression Father   . Drug abuse Father   . Bipolar disorder Father   . Heart disease Maternal Grandmother   . Pancreatitis Maternal Grandfather    . Cancer Paternal Grandmother   . Cancer Paternal Grandfather    Tobacco Screening: _0 [phone])::1)@ Social History:  History  Alcohol Use No     History  Drug Use No    Additional Social History:                           Allergies:  No Known Allergies Lab Results:  Results for orders placed or performed during the hospital encounter of 12/05/15 (from the past 48 hour(s))  Urine rapid drug screen (hosp performed)     Status: Abnormal   Collection Time: 12/05/15  3:40 AM  Result Value Ref Range   Opiates NONE DETECTED NONE DETECTED   Cocaine NONE DETECTED NONE DETECTED   Benzodiazepines NONE DETECTED NONE DETECTED   Amphetamines NONE DETECTED NONE DETECTED   Tetrahydrocannabinol POSITIVE (A) NONE DETECTED   Barbiturates NONE DETECTED NONE DETECTED    Comment:        DRUG SCREEN FOR MEDICAL PURPOSES ONLY.  IF CONFIRMATION IS NEEDED FOR ANY PURPOSE, NOTIFY LAB WITHIN 5 DAYS.        LOWEST  DETECTABLE LIMITS FOR URINE DRUG SCREEN Drug Class       Cutoff (ng/mL) Amphetamine      1000 Barbiturate      200 Benzodiazepine   840 Tricyclics       375 Opiates          300 Cocaine          300 THC              50   Pregnancy, urine     Status: None   Collection Time: 12/05/15  3:40 AM  Result Value Ref Range   Preg Test, Ur NEGATIVE NEGATIVE    Comment:        THE SENSITIVITY OF THIS METHODOLOGY IS >20 mIU/mL.   Urinalysis, Routine w reflex microscopic (not at [facility])     Status: Abnormal   Collection Time: 12/05/15  3:46 AM  Result Value Ref Range   Color, Urine YELLOW YELLOW   APPearance CLEAR CLEAR   Specific Gravity, Urine >1.030 (H) 1.005 - 1.030   pH 6.0 5.0 - 8.0   Glucose, UA NEGATIVE NEGATIVE mg/dL   Hgb urine dipstick NEGATIVE NEGATIVE   Bilirubin Urine NEGATIVE NEGATIVE   Ketones, ur NEGATIVE NEGATIVE mg/dL   Protein, ur NEGATIVE NEGATIVE mg/dL   Nitrite NEGATIVE NEGATIVE   Leukocytes, UA NEGATIVE NEGATIVE    Comment: MICROSCOPIC NOT  DONE ON URINES WITH NEGATIVE PROTEIN, BLOOD, LEUKOCYTES, NITRITE, OR GLUCOSE <1000 mg/dL.  CBC     Status: None   Collection Time: 12/05/15 11:37 AM  Result Value Ref Range   WBC 7.7 4.0 - 10.5 K/uL   RBC 4.50 3.87 - 5.11 MIL/uL   Hemoglobin 13.8 12.0 - 15.0 g/dL   HCT 40.4 36.0 - 46.0 %   MCV 89.8 78.0 - 100.0 fL   MCH 30.7 26.0 - 34.0 pg   MCHC 34.2 30.0 - 36.0 g/dL   RDW 12.7 11.5 - 15.5 %   Platelets 206 150 - 400 K/uL  Basic metabolic panel     Status: None   Collection Time: 12/05/15 11:37 AM  Result Value Ref Range   Sodium 137 135 - 145 mmol/L   Potassium 3.5 3.5 - 5.1 mmol/L   Chloride 103 101 - 111 mmol/L   CO2 27 22 - 32 mmol/L   Glucose, Bld 99 65 - 99 mg/dL   BUN 10 6 - 20 mg/dL   Creatinine, Ser 0.79 0.44 - 1.00 mg/dL   Calcium 9.2 8.9 - 10.3 mg/dL   GFR calc non Af Amer >60 >60 mL/min   GFR calc Af Amer >60 >60 mL/min    Comment: (NOTE) The eGFR has been calculated using the CKD EPI equation. This calculation has not been validated in all clinical situations. eGFR's persistently <60 mL/min signify possible Chronic Kidney Disease.    Anion gap 7 5 - 15  TSH     Status: None   Collection Time: 12/05/15 11:37 AM  Result Value Ref Range   TSH 1.216 0.350 - 4.500 uIU/mL    Blood Alcohol level:  Lab Results  Component Value Date   ETH <5 43/60/6770    Metabolic Disorder Labs:  Lab Results  Component Value Date   HGBA1C 5.4 10/14/2015   Lab Results  Component Value Date   PROLACTIN 4.0 10/14/2015   Lab Results  Component Value Date   CHOL 141 11/06/2012   TRIG 44 11/06/2012   HDL 52 11/06/2012   LDLCALC 80 11/06/2012  Current Medications: Current Facility-Administered Medications  Medication Dose Route Frequency Provider Last Rate Last Dose  . acetaminophen (TYLENOL) tablet 650 mg  650 mg Oral Q6H PRN Hildred Priest, MD      . alum & mag hydroxide-simeth (MAALOX/MYLANTA) 200-200-20 MG/5ML suspension 30 mL  30 mL Oral Q4H PRN  Hildred Priest, MD      . hydrOXYzine (ATARAX/VISTARIL) tablet 50 mg  50 mg Oral TID PRN Hildred Priest, MD      . LORazepam (ATIVAN) tablet 2 mg  2 mg Oral QID Clovis Fredrickson, MD   2 mg at 12/06/15 1000   Or  . LORazepam (ATIVAN) injection 2 mg  2 mg Intramuscular QID Mistee Soliman B Mayia Megill, MD      . magnesium hydroxide (MILK OF MAGNESIA) suspension 30 mL  30 mL Oral Daily PRN Hildred Priest, MD      . OLANZapine zydis (ZYPREXA) disintegrating tablet 10 mg  10 mg Oral Daily Tivis Wherry B Margarite Vessel, MD   10 mg at 12/06/15 1000  . OLANZapine zydis (ZYPREXA) disintegrating tablet 20 mg  20 mg Oral QHS Fernando Torry B Mika Anastasi, MD   20 mg at 12/05/15 2215  . risperiDONE (RISPERDAL) tablet 1 mg  1 mg Oral TID PRN Hildred Priest, MD      . traZODone (DESYREL) tablet 50 mg  50 mg Oral QHS PRN Hildred Priest, MD       PTA Medications: Prescriptions prior to admission  Medication Sig Dispense Refill Last Dose  . citalopram (CELEXA) 20 MG tablet Take 20 mg by mouth daily.     . Norgestimate-Ethinyl Estradiol Triphasic (ORTHO TRI-CYCLEN LO) 0.18/0.215/0.25 MG-25 MCG tab Take 1 tablet by mouth daily. (Patient not taking: Reported on 11/14/2015) 3 Package 0 Not Taking    Musculoskeletal: Strength & Muscle Tone: within normal limits Gait & Station: normal Patient leans: N/A  Psychiatric Specialty Exam: I reviewed physical exam performed in the emergency room and agree with the findings. Physical Exam  Nursing note and vitals reviewed.   Review of Systems  Psychiatric/Behavioral: Positive for hallucinations and substance abuse. The patient has insomnia.   All other systems reviewed and are negative.   Blood pressure 126/71, pulse 92, temperature 98.2 F (36.8 C), temperature source Oral, resp. rate 18, height _0  (1.6 m), weight 72.576 kg (160 lb), last menstrual period 11/07/2015, SpO2 99 %.Body mass index is 28.35 kg/(m^2).  See SRA.                                                   Sleep:  Number of Hours: 4       Treatment Plan Summary: Daily contact with patient to assess and evaluate symptoms and progress in treatment and Medication management   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  1. Catatonia. 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 The patient refuses medications. Forced medication orders will be placed.  2. Psychosis. Because of catatonia and Ativan use will not be able to use Zyprexa. We will likely start Haldol.  3. Social. Her mother applied for the guardianship. Guardian ad litem has been calling our Education officer, museum.  4. Smoking. Nicotine patch is available.  5. We'll oral intake. Paranoid tray and Ensure is available.   6. Cannabis abuse. We'll address substance abuse when patient is better.  7. Insomnia. She  is on trazodone.   8. Disposition. She will be discharged to home with her mother. She will follow up with a local provider.  Observation Level/Precautions:  15 minute checks  Laboratory:  CBC Chemistry Profile UDS UA  Psychotherapy:    Medications:    Consultations:    Discharge Concerns:    Estimated LOS:  Other:     I certify that inpatient services furnished can reasonably be expected to improve the patient's condition.    Orson Slick, MD 7/12/20171:25 PM

## 2015-12-06 NOTE — Progress Notes (Signed)
Patient standing in hallway.  She has been standing in the hallway for at least one hour.  Forced medication order was ordered.  Patient was offered 2 mg ativan po and she refused.  Informed patient that she will have to get an injection per doctor's order.  Patient shook her head adamantly.  Injection of 2 mg ativan and 5 mg haldol was drawn up and patient was approached with a show of force.  She willingly allowed staff to escort her to her room.  Once in her room, she became agitated once she realized she was getting an injection.  Patient was not willing to give nurse access to her arm, so staff held patient on bed for 1 minute while injection was given.  Patient was released and she stated, "please leave me alone.  Can you just leave me alone???"  Staff gave patient space and left the room.  She allowed MHT to take vital signs.  Patient is currently in room with the door shut.  She is resting quietly.

## 2015-12-06 NOTE — Progress Notes (Signed)
Recreation Therapy Notes  Date: 07.12.17 Time: 9:30 am Location: Craft Room  Group Topic: Self-esteem  Goal Area(s) Addresses:  Patient will write at least one positive trait about self. Patient will verbalize benefit of having healthy self-esteem.  Behavioral Response: Did not attend  Intervention: I Am  Activity: Patients were given a worksheet with the letter I on it and instructed to write as many positive traits about themselves inside the letter.  Education: LRT educated patients on ways they can increase their self-esteem.  Education Outcome: Acknowledges education/In group clarification offered/Needs additional education.   Clinical Observations/Feedback: Patient did not attend group.  [name],Bluford Sedler M, LRT/CTRS 12/06/2015 10:07 AM

## 2015-12-06 NOTE — Progress Notes (Signed)
[facility] MD Progress Note  12/06/2015 6:32 PM Theresa [name]  MRN:  [id number]   Subjective:  Theresa [name] slept all morning long but woke up for lunch. She ate little and refused oral medications. She was given Haldol injection. She has been tearful, longing for her family. She has no insight into her problems. She has been mute mostly but is able to keep a short conversation.Once off Ativan, we will transition to Zyprexa injections if oral Zyprexa refused per [facility] orders.  Principal Problem: Schizophrenia spectrum disorder with psychotic disorder type not yet determined [facility]) Diagnosis:   Patient Active Problem List   Diagnosis Date Noted  . Cannabis use disorder, moderate, dependence ([place]) [F12.20] 12/06/2015  . Schizophrenia spectrum disorder with psychotic disorder type not yet determined ([place]) [F20.89, F29] 12/05/2015  . Tobacco use disorder [F17.200] 12/05/2015  . MDD (major depressive disorder), single episode, severe with psychotic features ([place]) [F32.3] 11/04/2015  . Migraine without aura [G43.009]    Total Time spent with patient: 20 minutes  Past Psychiatric History: anxiety.  Past Medical History:  Past Medical History  Diagnosis Date  . Polycystic ovary syndrome   . Abnormal vaginal Pap smear   . STD (sexually transmitted disease) 10-14-15    chlamydia   . Migraine without aura   . Anxiety   . Depression    History reviewed. No pertinent past surgical history. Family History:  Family History  Problem Relation Age of Onset  . Hypertension Mother   . Heart disease Mother   . Cancer Mother   . Hypertension Other   . Depression Father   . Drug abuse Father   . Bipolar disorder Father   . Heart disease Maternal Grandmother   . Pancreatitis Maternal Grandfather   . Cancer Paternal Grandmother   . Cancer Paternal Grandfather    Family Psychiatric  History: bipolar diorder with psychosis in multiple family members. Social History:  History  Alcohol Use No      History  Drug Use No    Social History   Social History  . Marital Status: Single    Spouse Name: N/A  . Number of Children: N/A  . Years of Education: N/A   Social History Main Topics  . Smoking status: Current Every Day Smoker -- 0.50 packs/day    Types: Cigarettes  . Smokeless tobacco: Never Used  . Alcohol Use: No  . Drug Use: No  . Sexual Activity:    Partners: Male    Birth Control/ Protection: None   Other Topics Concern  . None   Social History Narrative   Additional Social History:                         Sleep: Fair  Appetite:  Poor  Current Medications: Current Facility-Administered Medications  Medication Dose Route Frequency Provider Last Rate Last Dose  . acetaminophen (TYLENOL) tablet 650 mg  650 mg Oral Q6H PRN Hildred Priest, MD      . alum & mag hydroxide-simeth (MAALOX/MYLANTA) 200-200-20 MG/5ML suspension 30 mL  30 mL Oral Q4H PRN Hildred Priest, MD      . diphenhydrAMINE (BENADRYL) capsule 25 mg  25 mg Oral Q6H PRN Rayman Petrosian B Jonnelle Lawniczak, MD       Or  . diphenhydrAMINE (BENADRYL) injection 25 mg  25 mg Intramuscular Q6H PRN Hancel Ion B Marranda Arakelian, MD      . feeding supplement (ENSURE ENLIVE) (ENSURE ENLIVE) liquid 237 mL  237 mL Oral  TID BM [place] B Kaileia Flow, MD   237 mL at 12/06/15 1549  . haloperidol (HALDOL) tablet 5 mg  5 mg Oral BID Ruslan Mccabe B Dezarae Mcclaran, MD   5 mg at 12/06/15 1444   Or  . haloperidol lactate (HALDOL) injection 5 mg  5 mg Intramuscular BID Symphonie Schneiderman B [place] Doster, MD      . hydrOXYzine (ATARAX/VISTARIL) tablet 50 mg  50 mg Oral TID PRN Hildred Priest, MD      . LORazepam (ATIVAN) tablet 2 mg  2 mg Oral QID Clovis Fredrickson, MD   2 mg at 12/06/15 1809   Or  . LORazepam (ATIVAN) injection 2 mg  2 mg Intramuscular QID Toiya Morrish B Tumeka Chimenti, MD      . magnesium hydroxide (MILK OF MAGNESIA) suspension 30 mL  30 mL Oral Daily PRN Hildred Priest, MD      . OLANZapine zydis  (ZYPREXA) disintegrating tablet 20 mg  20 mg Oral QHS Seri Kimmer B Giordan Fordham, MD   20 mg at 12/05/15 2215  . traZODone (DESYREL) tablet 50 mg  50 mg Oral QHS PRN Hildred Priest, MD        Lab Results:  Results for orders placed or performed during the hospital encounter of 12/05/15 (from the past 48 hour(s))  Urine rapid drug screen (hosp performed)     Status: Abnormal   Collection Time: 12/05/15  3:40 AM  Result Value Ref Range   Opiates NONE DETECTED NONE DETECTED   Cocaine NONE DETECTED NONE DETECTED   Benzodiazepines NONE DETECTED NONE DETECTED   Amphetamines NONE DETECTED NONE DETECTED   Tetrahydrocannabinol POSITIVE (A) NONE DETECTED   Barbiturates NONE DETECTED NONE DETECTED    Comment:        DRUG SCREEN FOR MEDICAL PURPOSES ONLY.  IF CONFIRMATION IS NEEDED FOR ANY PURPOSE, NOTIFY LAB WITHIN 5 DAYS.        LOWEST DETECTABLE LIMITS FOR URINE DRUG SCREEN Drug Class       Cutoff (ng/mL) Amphetamine      1000 Barbiturate      200 Benzodiazepine   814 Tricyclics       481 Opiates          300 Cocaine          300 THC              50   Pregnancy, urine     Status: None   Collection Time: 12/05/15  3:40 AM  Result Value Ref Range   Preg Test, Ur NEGATIVE NEGATIVE    Comment:        THE SENSITIVITY OF THIS METHODOLOGY IS >20 mIU/mL.   Urinalysis, Routine w reflex microscopic (not at [facility])     Status: Abnormal   Collection Time: 12/05/15  3:46 AM  Result Value Ref Range   Color, Urine YELLOW YELLOW   APPearance CLEAR CLEAR   Specific Gravity, Urine >1.030 (H) 1.005 - 1.030   pH 6.0 5.0 - 8.0   Glucose, UA NEGATIVE NEGATIVE mg/dL   Hgb urine dipstick NEGATIVE NEGATIVE   Bilirubin Urine NEGATIVE NEGATIVE   Ketones, ur NEGATIVE NEGATIVE mg/dL   Protein, ur NEGATIVE NEGATIVE mg/dL   Nitrite NEGATIVE NEGATIVE   Leukocytes, UA NEGATIVE NEGATIVE    Comment: MICROSCOPIC NOT DONE ON URINES WITH NEGATIVE PROTEIN, BLOOD, LEUKOCYTES, NITRITE, OR GLUCOSE <1000 mg/dL.   CBC     Status: None   Collection Time: 12/05/15 11:37 AM  Result Value Ref Range   WBC 7.7 4.0 -  10.5 K/uL   RBC 4.50 3.87 - 5.11 MIL/uL   Hemoglobin 13.8 12.0 - 15.0 g/dL   HCT 40.4 36.0 - 46.0 %   MCV 89.8 78.0 - 100.0 fL   MCH 30.7 26.0 - 34.0 pg   MCHC 34.2 30.0 - 36.0 g/dL   RDW 12.7 11.5 - 15.5 %   Platelets 206 150 - 400 K/uL  Basic metabolic panel     Status: None   Collection Time: 12/05/15 11:37 AM  Result Value Ref Range   Sodium 137 135 - 145 mmol/L   Potassium 3.5 3.5 - 5.1 mmol/L   Chloride 103 101 - 111 mmol/L   CO2 27 22 - 32 mmol/L   Glucose, Bld 99 65 - 99 mg/dL   BUN 10 6 - 20 mg/dL   Creatinine, Ser 0.79 0.44 - 1.00 mg/dL   Calcium 9.2 8.9 - 10.3 mg/dL   GFR calc non Af Amer >60 >60 mL/min   GFR calc Af Amer >60 >60 mL/min    Comment: (NOTE) The eGFR has been calculated using the CKD EPI equation. This calculation has not been validated in all clinical situations. eGFR's persistently <60 mL/min signify possible Chronic Kidney Disease.    Anion gap 7 5 - 15  TSH     Status: None   Collection Time: 12/05/15 11:37 AM  Result Value Ref Range   TSH 1.216 0.350 - 4.500 uIU/mL    Blood Alcohol level:  Lab Results  Component Value Date   ETH <5 33/54/5625    Metabolic Disorder Labs: Lab Results  Component Value Date   HGBA1C 5.4 10/14/2015   Lab Results  Component Value Date   PROLACTIN 4.0 10/14/2015   Lab Results  Component Value Date   CHOL 141 11/06/2012   TRIG 44 11/06/2012   HDL 52 11/06/2012   LDLCALC 80 11/06/2012    Physical Findings: AIMS:  , ,  ,  , Dental Status Current problems with teeth and/or dentures?: No Does patient usually wear dentures?: No  CIWA:    COWS:     Musculoskeletal: Strength & Muscle Tone: within normal limits Gait & Station: normal Patient leans: N/A  Psychiatric Specialty Exam: Physical Exam  Nursing note and vitals reviewed.   Review of Systems  Psychiatric/Behavioral: Positive for  hallucinations and substance abuse. The patient is nervous/anxious.   All other systems reviewed and are negative.   Blood pressure 103/62, pulse 92, temperature 98.6 F (37 C), temperature source Oral, resp. rate 20, height '5\' 3"'  (1.6 m), weight 72.576 kg (160 lb), last menstrual period 11/07/2015, SpO2 99 %.Body mass index is 28.35 kg/(m^2).  General Appearance: Casual  Eye Contact:  Poor  Speech:  Blocked  Volume:  Decreased  Mood:  Anxious and Depressed  Affect:  Tearful  Thought Process:  Disorganized  Orientation:  Full (Time, Place, and Person)  Thought Content:  Delusions, Hallucinations: Auditory and Paranoid Ideation  Suicidal Thoughts:  No  Homicidal Thoughts:  No  Memory:  Immediate;   Poor Recent;   Poor Remote;   Poor  Judgement:  Poor  Insight:  Lacking  Psychomotor Activity:  Psychomotor Retardation  Concentration:  Concentration: Poor and Attention Span: Poor  Recall:  Poor  Fund of Knowledge:  Poor  Language:  Poor  Akathisia:  No  Handed:  Right  AIMS (if indicated):     Assets:  Desire for Improvement Financial Resources/Insurance Housing Physical Health Resilience Social Support  ADL's:  Intact  Cognition:  Impaired,  Mild  Sleep:  Number of Hours: 4     Treatment Plan Summary: Daily contact with patient to assess and evaluate symptoms and progress in treatment and Medication management   Theresa [name] is a 20 year old female with 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  1. Catatonia. We started Ativan. The patient refuses medications. Forced medication orders is in place.  2. Psychosis. Because of catatonia and Ativan use will not be able to use i.m. Zyprexa. We started Haldol per forced medication orders.  3. Social. Her mother applied for the guardianship but the judge dismissed the case. Guardian ad litem has been calling our Education officer, museum.  4. Smoking. Nicotine patch is available.  5. Poor oral intake. Paranoid tray and Ensure is  available.   6. Cannabis abuse. We'll address substance abuse when patient is better.  7. Insomnia. She is on trazodone.   8. Disposition. She will be discharged to home with her mother. She will follow up with a local provider.  Orson Slick, MD 12/06/2015, 6:32 PM

## 2015-12-06 NOTE — [facility] Group Notes (Signed)
[facility] LCSW Group Therapy   12/06/2015  1pm  Type of Therapy: Group Therapy   Participation Level: Did Not Attend. Patient invited to participate but declined.    Jazmine Longshore F. Essa Wenk, MSW, LCSWA, LCAS     

## 2015-12-07 ENCOUNTER — Ambulatory Visit: Payer: BLUE CROSS/BLUE SHIELD | Admitting: Internal Medicine

## 2015-12-07 MED ORDER — LORAZEPAM 2 MG/ML IJ SOLN: 1.0000 mg | Freq: Four times a day (QID) | INTRAMUSCULAR | Status: DC | PRN

## 2015-12-07 MED ORDER — LORAZEPAM 1 MG PO TABS
1.0000 mg | ORAL_TABLET | Freq: Four times a day (QID) | ORAL | Status: DC | PRN
Start: 2015-12-07 — End: 2015-12-12
  Filled 2015-12-07: qty 1

## 2015-12-07 NOTE — Progress Notes (Signed)
Theresa [name] slept till [id number].She woke up & went to day room for lunch.Refused to PO medicine.Encouraged patient to take medicine & explained to her about the forced med.order.Patient repeatedly states "I don't need any medicines."Injection given in the presence of 2 other staffs to make sure the stability of the patient.Patient was cooperative with injection.Patient remained mute most of the time.Patient cries at times states "I need to go home."Encouragement & reassurance given.Safety maintained.

## 2015-12-07 NOTE — [facility] Group Notes (Signed)
Goals Group  Date/Time: 12/07/15 9am  Type of Therapy and Topic: Group Therapy: Goals Group: SMART Goals   Pt was called, but did not attend   Aza Dantes F. Valyn Latchford, LCSWA, LCAS  

## 2015-12-07 NOTE — Progress Notes (Signed)
Recreation Therapy Notes  Date: 07.13.17 Time: 9:30 am Location: Craft Room  Group Topic: Leisure Education  Goal Area(s) Addresses:  Patient will identify activities for each letter of the alphabet. Patient will verbalize ability to use leisure as a Associate Professorcoping skill.  Behavioral Response: Did not attend  Intervention: Leisure Alphabet  Activity: Patients were given a Leisure Information systems managerAlphabet worksheet and instructed to identify a leisure activity for each letter of the alphabet.   Education: LRT educated patients on what they need to participate in leisure.  Education Outcome: Patient did not attend group.   Clinical Observations/Feedback: Patient did not attend group.  [name],Wylee Dorantes M, LRT/CTRS 12/07/2015 10:12 AM

## 2015-12-07 NOTE — [facility] Group Notes (Signed)
[facility] Group Notes:  (Nursing/MHT/Case Management/Adjunct)  Date:  12/07/2015  Time:  3:22 AM  Type of Therapy:  Psychoeducational Skills  Participation Level:  Did Not Attend  Summary of Progress/Problems:  Jacqui Headen Y Georgian Mcclory 12/07/2015, 3:22 AM 

## 2015-12-07 NOTE — Progress Notes (Signed)
D: Observed pt in room resting. Patient alert and oriented to person and place, pt disoriented to time and situation. Patient denies SI/HI/AVH. Pt affect is flat and sad. Pt forwarded very little and interacted minimally at first. Pt was also very resistant to medication administration. Pt rated depression 5/10 and anxiety "high." Pt mother came to visit at beginning of shift, and pt was too tired to have visitor. Pt had no complains other than "don't want to be here...want to be with my family."  A: Offered active listening and support. Provided therapeutic communication. Administered scheduled medications. Provided strong encouragement about medication and medication regimen. Oriented pt to time and situation. Discussed the plan of treatment. R: Pt became more cooperative and slightly more open after strong encouragement and conversation with Clinical research associatewriter . Pt medication compliant after strong persistence. Will continue Q15 min. checks. Safety maintained.

## 2015-12-07 NOTE — Progress Notes (Signed)
Patient ID: Theresa [name], female   DOB: 1995/11/05, 20 y.o.   MRN: [id number]  [name] attempted PSA. Pt was unable to participate due to symptoms. [name] will attempt again at a later time.   Daisy FloroCandace L Antuane Eastridge MSW, LCSWA  12/07/2015  11:25 AM

## 2015-12-07 NOTE — [facility] Group Notes (Signed)
[facility] LCSW Group Therapy   12/07/2015 1pm   Type of Therapy: Group Therapy   Participation Level: Active   Participation Quality: Attentive, Sharing and Supportive   Affect: Appropriate   Cognitive: Alert and Oriented   Insight: Developing/Improving and Engaged   Engagement in Therapy: Developing/Improving and Engaged   Modes of Intervention: Clarification, Confrontation, Discussion, Education, Exploration, Limit-setting, Orientation, Problem-solving, Rapport Building, Dance movement psychotherapisteality Testing, Socialization and Support   Summary of Progress/Problems: The topic for group was balance in life. Today's group focused on defining balance in one's own words, identifying things that can knock one off balance, and exploring healthy ways to maintain balance in life. Group members were asked to provide an example of a time when they felt off balance, describe how they handled that situation, and process healthier ways to regain balance in the future. Group members were asked to share the most important tool for maintaining [facility] and how they plan to apply this method after discharge. Pt refused to share but did remain in group.  Pt did not respond to prompts from the [name] except for shaking her head "no".  Pt was cooperative with the [name] and other group members and focused and attentive to the topics discussed and the sharing of others.

## 2015-12-07 NOTE — [facility] Group Notes (Signed)
[facility] Group Notes:  (Nursing/MHT/Case Management/Adjunct)  Date:  12/07/2015  Time:  3:57 PM  Type of Therapy:  Psychoeducational Skills  Participation Level:  Did Not Attend  Theresa [name] Theresa [name] 12/07/2015, 3:57 PM 

## 2015-12-07 NOTE — Plan of Care (Signed)
Problem: Education: Goal: Verbalization of understanding the information provided will improve Outcome: Not Progressing Pt did not effectively verbalize understanding of education

## 2015-12-08 MED ORDER — OLANZAPINE 10 MG IM SOLR: 10.0000 mg | Freq: Every morning | INTRAMUSCULAR | Status: DC

## 2015-12-08 MED ORDER — OLANZAPINE 5 MG PO TBDP
20.0000 mg | ORAL_TABLET | Freq: Every day | ORAL | Status: DC
Start: 2015-12-08 — End: 2015-12-11
  Administered 2015-12-11: 20 mg via ORAL
  Filled 2015-12-08 (×2): qty 4

## 2015-12-08 MED ORDER — OLANZAPINE 10 MG IM SOLR
10.0000 mg | Freq: Every morning | INTRAMUSCULAR | Status: DC
  Administered 2015-12-08 – 2015-12-10 (×3): 10 mg via INTRAMUSCULAR
  Filled 2015-12-08 (×3): qty 10

## 2015-12-08 NOTE — Plan of Care (Signed)
Problem: Activity: Goal: Interest or engagement in activities will improve Outcome: Not Met (add Reason) Patient did not participate in group. Limited interaction with peers and staff. Snack offered and accepted. Visited by family member. No behavior issues noted.

## 2015-12-08 NOTE — Progress Notes (Signed)
[facility]  12/08/2015 2:29 PM Edrick Kinsllison M [name]  MRN:  [id number]  Subjective:  Ms. Theresa FetchCoffman is a 20 year old female has episode of psychosis for the past couple of months. She was admitted to [facility] but was not treated as she refused all medications. The family tried to obtain guardianship to force treatment but the judge rejected their petition. The patient did poorly following discharge. She did not eat, sleep, talk, or shower. She experienced auditory and visual hallucinations and extreme paranoia. She thinks that her food is poisoned and is afraid of all electronic devices. She believes that she is calmer as implanted in her eyes and speakers in her ears. 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 She was treated with Ativan that had to be forced. She is no longer catatonic but speaks very little and seems very disorganized in her thinking. She drinks and sure of the year and has paranoid tray ordered. There is forced medications order. We initially started Ativan for catatonia and Haldol for psychosis. I will discontinue Haldol and start Zyprexa injections as she is of Ativan now. We will learn from the mother that the patient had additional traumatic event in which her friend died and the patient most likely feels guilty about. Has been in touch with the social worker here. She agreed that the patient will need for guardianship and will discuss it with the judge. This is especially important that this patient may be a candidate for ECT treatment. Her family is very supportive and involved. They have high hopes that this is an episode of bipolar disorder. Bipolar disorder presentation runs in the family and the patient mother was able to name several family members including her brother will responded well to Risperdal.  Today the patient was able to participate for the first time in treatment team meeting. She seemed to respond to internal stimuli. She mostly communicated by nodding her  head. She did give us few simple answers. She didn't bring ensure today. She refused oral medication and was given Haldol injection. She has no somatic complaints. She does not participate in programming. I spoke with Dr. Toni Amendlapacs will be on call this weekend and asking for ECT consult.  Principal Problem: Schizophrenia spectrum disorder with psychotic disorder type not yet determined [facility](HCC) Diagnosis:   Patient Active Problem List   Diagnosis Date Noted  . Cannabis use disorder, moderate, dependence (HCC) [F12.20] 12/06/2015  . Schizophrenia spectrum disorder with psychotic disorder type not yet determined (HCC) [F20.89, F29] 12/05/2015  . Tobacco use disorder [F17.200] 12/05/2015  . 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 Migraine without aura [G43.009]    Total Time spent with patient: 20 minutes  Past Psychiatric History: Psychosis, depression.  Past Medical History:  Past Medical History  Diagnosis Date  . Polycystic ovary syndrome   . Abnormal vaginal Pap smear   . STD (sexually transmitted disease) 10-14-15    chlamydia   . Migraine without aura   . Anxiety   . Depression    History reviewed. No pertinent past surgical history. Family History:  Family History  Problem Relation Age of Onset  . Hypertension Mother   . Heart disease Mother   . Cancer Mother   . Hypertension Other   . Depression Father   . Drug abuse Father   . Bipolar disorder Father   . Heart disease Maternal Grandmother   . Pancreatitis Maternal Grandfather   . Cancer  Paternal Grandmother   . Cancer Paternal Grandfather    Family Psychiatric  History: Bipolar disorder Social History:  History  Alcohol Use No     History  Drug Use No    Social History   Social History  . Marital Status: Single    Spouse Name: N/A  . Number of Children: N/A  . Years of Education: N/A   Social History Main Topics  . Smoking status: Current  Every Day Smoker -- 0.50 packs/day    Types: Cigarettes  . Smokeless tobacco: Never Used  . Alcohol Use: No  . Drug Use: No  . Sexual Activity:    Partners: Male    Birth Control/ Protection: None   Other Topics Concern  . None   Social History Narrative   Additional Social History:                         Sleep: Fair  Appetite:  Poor  Current Medications: Current Facility-Administered Medications  Medication Dose Route Frequency Provider Last Rate Last Dose  . acetaminophen (TYLENOL) tablet 650 mg  650 mg Oral Q6H PRN Jimmy Footman, MD      . alum & mag hydroxide-simeth (MAALOX/MYLANTA) 200-200-20 MG/5ML suspension 30 mL  30 mL Oral Q4H PRN Jimmy Footman, MD      . diphenhydrAMINE (BENADRYL) capsule 25 mg  25 mg Oral Q6H PRN Daleisa Halperin B Dayton Sherr, MD       Or  . diphenhydrAMINE (BENADRYL) injection 25 mg  25 mg Intramuscular Q6H PRN Darin Redmann B Laiba Fuerte, MD      . feeding supplement (ENSURE ENLIVE) (ENSURE ENLIVE) liquid 237 mL  237 mL Oral TID BM Tashara Suder B Ayanna Gheen, MD   237 mL at 12/08/15 1053  . haloperidol (HALDOL) tablet 5 mg  5 mg Oral BID Haydyn Girvan B Kayron Kalmar, MD   5 mg at 12/07/15 2045   Or  . haloperidol lactate (HALDOL) injection 5 mg  5 mg Intramuscular BID Analeya Luallen B Kaelon Weekes, MD   5 mg at 12/08/15 1053  . hydrOXYzine (ATARAX/VISTARIL) tablet 50 mg  50 mg Oral TID PRN Jimmy Footman, MD      . LORazepam (ATIVAN) tablet 1 mg  1 mg Oral Q6H PRN Shari Prows, MD       Or  . LORazepam (ATIVAN) injection 1 mg  1 mg Intramuscular Q6H PRN Karin Griffith B Mara Favero, MD      . magnesium hydroxide (MILK OF MAGNESIA) suspension 30 mL  30 mL Oral Daily PRN Jimmy Footman, MD      . OLANZapine zydis (ZYPREXA) disintegrating tablet 20 mg  20 mg Oral QHS Teron Blais B Zenovia Justman, MD   20 mg at 12/06/15 2317  . traZODone (DESYREL) tablet 50 mg  50 mg Oral QHS PRN Jimmy Footman, MD        Lab Results: No  results found for this or any previous visit (from the past 48 hour(s)).  Blood Alcohol level:  Lab Results  Component Value Date   ETH <5 11/03/2015    Metabolic Disorder Labs: Lab Results  Component Value Date   HGBA1C 5.4 10/14/2015   Lab Results  Component Value Date   PROLACTIN 4.0 10/14/2015   Lab Results  Component Value Date   CHOL 141 11/06/2012   TRIG 44 11/06/2012   HDL 52 11/06/2012   LDLCALC 80 11/06/2012    Physical Findings: AIMS:  , ,  ,  , Dental Status Current problems with teeth and/or  dentures?: No Does patient usually wear dentures?: No  CIWA:    COWS:     Musculoskeletal: Strength & Muscle Tone: within normal limits Gait & Station: normal Patient leans: N/A  Psychiatric Specialty Exam: Physical Exam  Nursing note and vitals reviewed.   Review of Systems  Psychiatric/Behavioral: Positive for depression and hallucinations. The patient is nervous/anxious.   All other systems reviewed and are negative.   Blood pressure 102/64, pulse 97, temperature 98.5 F (36.9 C), temperature source Oral, resp. rate 18, height  (1.6 m), weight 72.576 kg (160 lb), last menstrual period 11/07/2015, SpO2 99 %.Body mass index is 28.35 kg/(m^2).  General Appearance: Casual  Eye Contact:  Good  Speech:  Blocked  Volume:  Decreased  Mood:  Depressed  Affect:  Tearful  Thought Process:  Disorganized  Orientation:  Other:  To person and place only  Thought Content:  Delusions, Hallucinations: Auditory Visual and Paranoid Ideation  Suicidal Thoughts:  No  Homicidal Thoughts:  No  Memory:  Immediate;   Poor Recent;   Poor Remote;   Poor  Judgement:  Poor  Insight:  Lacking  Psychomotor Activity:  Psychomotor Retardation  Concentration:  Concentration: Poor and Attention Span: Poor  Recall:  Poor  Fund of Knowledge:  Poor  Language:  Poor  Akathisia:  No  Handed:  Right  AIMS (if indicated):     Assets:  Desire for Improvement Financial  Resources/Insurance Housing Physical Health Resilience Social Support  ADL's:  Intact  Cognition:  Impaired,  Mild  Sleep:  Number of Hours: 7.3     Treatment Plan Summary: Daily contact with patient to assess and evaluate symptoms and progress in treatment and Medication management   Ms. 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  1. Catatonia. Resolved. We will discontinue standing Ativan.   2. Psychosis. We will discontinue Haldol and start Zyprexa zydis. If she refuses, non emergency forced medications orders are in place.   3. Social. Her mother applied for the guardianship but the judge deny it. Her guardian ad litem to will try to obtain temporary guardianship instead.   4. Smoking. Nicotine patch is available.  5. Poor oral intake. Paranoid tray and Ensure is available.   6. Cannabis abuse. We'll address substance abuse when patient is better.  7. Insomnia. She is on trazodone.   8. ECT. I will ask Dr. Toni Amend for a consultation.   9. Disposition. She will be discharged to home with her mother. She will follow up with a local provider.  Kristine Linea, MD 12/08/2015, 2:29 PM

## 2015-12-08 NOTE — Progress Notes (Signed)
  [name] attempted PSA. Pt was unable to participate due to symptoms. [name] will attempt again at a later time.   Katherine Syme L Kellan Boehlke MSW, LCSWA  12/08/2015 2:40 PM  

## 2015-12-08 NOTE — [facility] Group Notes (Signed)
[facility] Group Notes:  (Nursing/MHT/Case Management/Adjunct)  Date:  12/08/2015  Time:  1:55 AM  Type of Therapy:  Group Therapy  Participation Level:  Did Not Attend    Summary of Progress/Problems:  Theresa [name] Theresa [name] 12/08/2015, 1:55 AM

## 2015-12-08 NOTE — Progress Notes (Signed)
Pt remains mute but able to make needs known to staff. Pt requires encouragement to participate in group and medication regime.  tolerated po Haldol well But spit out Zyprexa. Dr Garnetta BuddyFaheem notified. N.N.O. Pt mother visit earlier in the shift. Pt states she wants to go home. Pt did not participate in group. Denies SI/HI/AV/H. No c/o pain/discomfort noted. Pt resting quietly in bed. q 15 min checks maintained for safety and behavior.

## 2015-12-08 NOTE — Progress Notes (Signed)
D:  Pt has been isolating in the room, elective mutism, answers some questions from staff and at other times patient stares intensely and does not answer, pt refused to take PO medications today, pt did drink the ensure twice today and came out of room into the hallway twice, after patient received the Im zyprexa she came out of her room and went to the exit door and attempted to open it, states that she wants to leave and doesn't need to be here, redirected patient and patient chose to go back to her room, difficult to redirect at times.   A:  Emotional support provided, Encouraged pt to continue with treatment plan and attend all group activities, q15 min checks maintained for safety.  R:  Pt is not receptive, not going to groups, difficult to redirect and needs occasional redirection, pt has a force med order and is presently refusing her Po medication, pt did however take the Im injections today without resisting, no holds were necessary.

## 2015-12-08 NOTE — Progress Notes (Signed)
Recreation Therapy Notes  Date: 07.14.17 Time: 9:30 am Location: Craft Room  Group Topic: Coping Skills  Goal Area(s) Addresses:  Patient will participate in healthy coping skill. Patient will verbalize one emotion experienced during group.  Behavioral Response: Did not attend  Intervention: Coloring  Activity: Patients were given coloring sheets and instructed to color while thinking about the emotions they were feeling and what their mind was focused on.  Education: LRT educated patients on healthy coping skills.  Education Outcome: Patient did not attend group.   Clinical Observations/Feedback: Patient did not attend group.  [name],Darnell Stimson M, LRT/CTRS 12/08/2015 10:25 AM

## 2015-12-08 NOTE — [facility] Group Notes (Signed)
[facility] Group Notes:  (Nursing/MHT/Case Management/Adjunct)  Date:  12/08/2015  Time:  3:37 PM  Type of Therapy:  Movement Therapy  Participation Level:  Did Not Attend  Micharl Helmes De'Chelle Emran Molzahn 12/08/2015, 3:37 PM

## 2015-12-08 NOTE — Plan of Care (Signed)
Problem: Education: Goal: Will be free of psychotic symptoms Outcome: Not Progressing Pt appears to be responding to internal stimuli, thought blocking, elective mutism, refusing PO medications.

## 2015-12-08 NOTE — H&P (Signed)
[facility] LCSW Group Therapy   12/09/2015  1pm  Type of Therapy: Group Therapy   Participation Level: Did Not Attend. Patient invited to participate but declined.    Kathryne Ramella F. Lunden Mcleish, MSW, LCSWA, LCAS     

## 2015-12-08 NOTE — Tx Team (Signed)
Interdisciplinary Treatment Plan Update (Adult)  Date:  12/08/2015 Time Reviewed:  2:45 PM  Progress in Treatment: Attending groups: No. Participating in groups:  No. Taking medication as prescribed:  No. and As evidenced by:  Forced med order Tolerating medication:  Yes. Family/Significant othe contact made:  Yes, individual(s) contacted:  mother  Patient understands diagnosis:  No. and As evidenced by:  Limited insight  Discussing patient identified problems/goals with staff:  Yes. Medical problems stabilized or resolved:  Yes. Denies suicidal/homicidal ideation: Yes. Issues/concerns per patient self-inventory:  Yes. Other:  New problem(s) identified: No, Describe:  NA  Discharge Plan or Barriers: Pt plans to return home and follow up with outpatient.    Reason for Continuation of Hospitalization: Delusions  Hallucinations Medication stabilization Other; describe Catatonia, Paranoia   Comments: The patient is catatonic and mute. She is unable to provide any information. She communicates by nodding her head. Apparently there is no history of mental illness but or the past month the patient has been increasingly psychotic and strange. She reported visual and auditory hallucinations to her mother, she believes that there are cameras implanted in her eyes and recording devices in her ears. At times she would not recognize her brother or boyfriend. During guardianship hearing she took the judge for her father. She was laughing and giggling inappropriately. She reported visual hallucinations to her mother as well. The patient stopped taking care of her apartment, showering and eating and became mute. [facility] or drink. Per Mother's report the patient did not receive any medications at [facility] as she was refusing. She was discharged with no medications. She spent last few days at her mother's house. She was  able to drink and eat a little. The mother reports severe anxiety. In June she was prescribed Prozac for anxiety by her gynecologist. She had major anxiety attack following her first dose and ended up in the emergency room. The patient was positive for cannabis on admission. The family denies any alcohol or substance use.  Estimated length of stay: 7 days   New goal(s): NA  Review of initial/current patient goals per problem list:   1.  Goal(s): Patient will participate in aftercare plan * Met:  * Target date: at discharge * As evidenced by: Patient will participate within aftercare plan AEB aftercare provider and housing plan at discharge being identified.  2.  Goal (s): Patient will demonstrate decreased symptoms of psychosis. * Met: No  *  Target date: at discharge * As evidenced by: Patient will not endorse signs of psychosis or be deemed stable for discharge by MD.   Attendees: Patient:  Theresa [name] 7/14/20172:45 PM  Family:   7/14/20172:45 PM  Physician:   Dr. Bary Leriche  7/14/20172:45 PM  Nursing:   Elige Radon, RN  7/14/20172:45 PM  Case Manager:   7/14/20172:45 PM  Counselor:   7/14/20172:45 PM  Other:  Wray Kearns, LCSWA 7/14/20172:45 PM  Other:  Everitt Amber. LRT  7/14/20172:45 PM  Other:   7/14/20172:45 PM  Other:  7/14/20172:45 PM  Other:  7/14/20172:45 PM  Other:  7/14/20172:45 PM  Other:  7/14/20172:45 PM  Other:  7/14/20172:45 PM  Other:  7/14/20172:45 PM  Other:   7/14/20172:45 PM   Scribe for Treatment Team:   Campbell Stall Khy Pitre,MSW, LCSWA  12/08/2015, 2:45 PM

## 2015-12-09 NOTE — Progress Notes (Signed)
D: Observed pt walking the hallway. Patient alert and oriented to person. Pt appears disoriented to place, time and situation, but hard to tell as pt will not verbalize answers. Pt denies SI/HI/AVH, via nodding. Pt affect is flat and sad. Pt would not verbalize anything else. Pt walks the hallway appearing confused, not answering questions and staring. Pt not aggressive or combative. A: Offered active listening and support. Provided therapeutic communication. Attempted to orient pt to unit and redirect pt when necessary.   R: Pt pleasant and cooperative. Pt drunk ensure. Pt remains confused and needs direction. Will continue Q15 min. checks. Safety maintained.

## 2015-12-09 NOTE — [facility] Group Notes (Signed)
[facility] LCSW Group Therapy  12/09/2015 1:31 PM  Type of Therapy:  Group Therapy  Participation Level:  Did Not Attend but was invited to attend.   Summary of Progress/Problems:  Emotional Regulation: Patients will identify both negative and positive emotions. They will discuss emotions they have difficulty regulating and how they impact their lives. Patients will be asked to identify healthy coping skills to combat unhealthy reactions to negative emotions.    Deloma Spindle G. Garnette CzechSampson MSW, LCSWA  12/09/2015, 1:31 PM

## 2015-12-09 NOTE — Progress Notes (Deleted)
Theresa [name] spent most of shift resting in bed. She had minimal interaction with peers and staff on the unit, she seemed to be somewhat irritable on approach. She appears to be in bed resting at this time.             

## 2015-12-09 NOTE — Progress Notes (Addendum)
Patient with flat affect, blank stare. No SI/HI at this time. Withdrawn and sleepy in bed this am. No interaction with peers. Alert, unwilling to state name and birth date. Able to state nurse's name after introduction. Writer reads daily inspirational book for today's date with patient. Patient refuses po med and IM administered as ordered. Patient tolerates well. Patient states " I want to see my Mom today". Safety maintained. Patient refusing shower after set up with towels and toiletries.

## 2015-12-09 NOTE — Progress Notes (Signed)
[facility] MD Progress Note  12/09/2015 10:35 PM CINTIA GLEED  MRN:  [id number]  Subjective:  Ms. Karie Fetch is a 20 year old female has episode of psychosis for the past couple of months. She was admitted to [facility] but was not treated as she refused all medications. The family tried to obtain guardianship to force treatment but the judge rejected their petition. The patient did poorly following discharge. She did not eat, sleep, talk, or shower. She experienced auditory and visual hallucinations and extreme paranoia. She thinks that her food is poisoned and is afraid of all electronic devices. She believes that she is calmer as implanted in her eyes and speakers in her ears. 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 She was treated with Ativan that had to be forced. She is no longer catatonic but speaks very little and seems very disorganized in her thinking. She drinks and sure of the year and has paranoid tray ordered. There is forced medications order. We initially started Ativan for catatonia and Haldol for psychosis. I will discontinue Haldol and start Zyprexa injections as she is of Ativan now. We will learn from the mother that the patient had additional traumatic event in which her friend died and the patient most likely feels guilty about. Has been in touch with the social worker here. She agreed that the patient will need for guardianship and will discuss it with the judge. This is especially important that this patient may be a candidate for ECT treatment. Her family is very supportive and involved. They have high hopes that this is an episode of bipolar disorder. Bipolar disorder presentation runs in the family and the patient mother was able to name several family members including her brother will responded well to Risperdal.  Patient is seen. Chart reviewed. Spoke with the family as well. 20 year old woman with an extended but relatively recent onset psychotic disorder. Current presentation  is just this side of catatonic. Makes occasional eye contact. Would not speak to me at all. Affect flat. Mostly seems to stand around the unit looking confused and staring off into space. Refusing oral medicine but getting injections. Possibly some slight improvement given that she spoke to her treatment team a few words yesterday. Not able to answer any questions about suicidality. Issue was raised about ECT as a possible treatment.  Principal Problem: Schizophrenia spectrum disorder with psychotic disorder type not yet determined [facility]) Diagnosis:   Patient Active Problem List   Diagnosis Date Noted  . Cannabis use disorder, moderate, dependence (HCC) [F12.20] 12/06/2015  . Schizophrenia spectrum disorder with psychotic disorder type not yet determined (HCC) [F20.89, F29] 12/05/2015  . Tobacco use disorder [F17.200] 12/05/2015  . 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 Migraine without aura [G43.009]    Total Time spent with patient: 20 minutes  Past Psychiatric History: Psychosis, depression.  Past Medical History:  Past Medical History  Diagnosis Date  . Polycystic ovary syndrome   . Abnormal vaginal Pap smear   . STD (sexually transmitted disease) 10-14-15    chlamydia   . Migraine without aura   . Anxiety   . Depression    History reviewed. No pertinent past surgical history. Family History:  Family History  Problem Relation Age of Onset  . Hypertension Mother   . Heart disease Mother   . Cancer Mother   . Hypertension Other   . Depression Father   . Drug abuse Father   . Bipolar  disorder Father   . Heart disease Maternal Grandmother   . Pancreatitis Maternal Grandfather   . Cancer Paternal Grandmother   . Cancer Paternal Grandfather    Family Psychiatric  History: Bipolar disorder Social History:  History  Alcohol Use No     History  Drug Use No    Social History   Social History  . Marital  Status: Single    Spouse Name: N/A  . Number of Children: N/A  . Years of Education: N/A   Social History Main Topics  . Smoking status: Current Every Day Smoker -- 0.50 packs/day    Types: Cigarettes  . Smokeless tobacco: Never Used  . Alcohol Use: No  . Drug Use: No  . Sexual Activity:    Partners: Male    Birth Control/ Protection: None   Other Topics Concern  . None   Social History Narrative   Additional Social History:                         Sleep: Fair  Appetite:  Poor  Current Medications: Current Facility-Administered Medications  Medication Dose Route Frequency Provider Last Rate Last Dose  . acetaminophen (TYLENOL) tablet 650 mg  650 mg Oral Q6H PRN Jimmy Footman, MD      . alum & mag hydroxide-simeth (MAALOX/MYLANTA) 200-200-20 MG/5ML suspension 30 mL  30 mL Oral Q4H PRN Jimmy Footman, MD      . feeding supplement (ENSURE ENLIVE) (ENSURE ENLIVE) liquid 237 mL  237 mL Oral TID BM Jolanta B Pucilowska, MD   237 mL at 12/09/15 2152  . hydrOXYzine (ATARAX/VISTARIL) tablet 50 mg  50 mg Oral TID PRN Jimmy Footman, MD      . LORazepam (ATIVAN) tablet 1 mg  1 mg Oral Q6H PRN Jolanta B Pucilowska, MD      . magnesium hydroxide (MILK OF MAGNESIA) suspension 30 mL  30 mL Oral Daily PRN Jimmy Footman, MD      . OLANZapine (ZYPREXA) injection 10 mg  10 mg Intramuscular q morning - 10a Jolanta B Pucilowska, MD   10 mg at 12/09/15 0930  . OLANZapine zydis (ZYPREXA) disintegrating tablet 20 mg  20 mg Oral Daily Jolanta B Pucilowska, MD   20 mg at 12/08/15 1552  . traZODone (DESYREL) tablet 50 mg  50 mg Oral QHS PRN Jimmy Footman, MD        Lab Results: No results found for this or any previous visit (from the past 48 hour(s)).  Blood Alcohol level:  Lab Results  Component Value Date   ETH <5 11/03/2015    Metabolic Disorder Labs: Lab Results  Component Value Date   HGBA1C 5.4 10/14/2015   Lab  Results  Component Value Date   PROLACTIN 4.0 10/14/2015   Lab Results  Component Value Date   CHOL 141 11/06/2012   TRIG 44 11/06/2012   HDL 52 11/06/2012   LDLCALC 80 11/06/2012    Physical Findings: AIMS:  , ,  ,  , Dental Status Current problems with teeth and/or dentures?: No Does patient usually wear dentures?: No  CIWA:    COWS:     Musculoskeletal: Strength & Muscle Tone: within normal limits Gait & Station: normal Patient leans: N/A  Psychiatric Specialty Exam: Physical Exam  Nursing note and vitals reviewed.   Review of Systems  Psychiatric/Behavioral: Positive for depression and hallucinations. The patient is nervous/anxious.   All other systems reviewed and are negative.   Blood pressure  94/51, pulse 55, temperature 98.5 F (36.9 C), temperature source Oral, resp. rate 18, height 5\' 3"  (1.6 m), weight 72.576 kg (160 lb), last menstrual period 11/07/2015, SpO2 99 %.Body mass index is 28.35 kg/(m^2).  General Appearance: Casual  Eye Contact:  Good  Speech:  Blocked  Volume:  Decreased  Mood:  Depressed  Affect:  Tearful  Thought Process:  Disorganized  Orientation:  Other:  To person and place only  Thought Content:  Delusions, Hallucinations: Auditory Visual and Paranoid Ideation  Suicidal Thoughts:  No  Homicidal Thoughts:  No  Memory:  Immediate;   Poor Recent;   Poor Remote;   Poor  Judgement:  Poor  Insight:  Lacking  Psychomotor Activity:  Psychomotor Retardation  Concentration:  Concentration: Poor and Attention Span: Poor  Recall:  Poor  Fund of Knowledge:  Poor  Language:  Poor  Akathisia:  No  Handed:  Right  AIMS (if indicated):     Assets:  Desire for Improvement Financial Resources/Insurance Housing Physical Health Resilience Social Support  ADL's:  Intact  Cognition:  Impaired,  Mild  Sleep:  Number of Hours: 7.75     Treatment Plan Summary: Daily contact with patient to assess and evaluate symptoms and progress in  treatment and Medication management   Ms. 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  1. Catatonia. Resolved. We will discontinue standing Ativan.   2. Psychosis. We will discontinue Haldol and start Zyprexa zydis. If she refuses, non emergency forced medications orders are in place.   3. Social. Her mother applied for the guardianship but the judge deny it. Her guardian ad litem to will try to obtain temporary guardianship instead.   4. Smoking. Nicotine patch is available.  5. Poor oral intake. Paranoid tray and Ensure is available.   6. Cannabis abuse. We'll address substance abuse when patient is better.  7. Insomnia. She is on trazodone.   8. ECT. I will ask Dr. Toni Amendlapacs for a consultation.   9. Disposition. She will be discharged to home with her mother. She will follow up with a local provider.   Patient with a psychotic syndrome. Could be schizophreniform or new onset psychotic mood disorder. Given the nearly catatonic like presentation right now and that failure so far to respond to antipsychotics I think ECT would actually be quite a good idea. I would be very willing to consider pursuing ECT particularly as this patient has no significant contraindications. Patient clearly cannot currently give consent. I did not bring it up with family tonight but I understand there is a good chance they might give consent that the patient is currently her own guardian. I will continue to follow-up with the primary treatment team after the weekend. No change to orders today. Mordecai RasmussenJohn Clapacs, MD 12/09/2015, 10:35 PM

## 2015-12-09 NOTE — Plan of Care (Signed)
Problem: Coping: Goal: Ability to verbalize feelings will improve Outcome: Progressing Pt did not verbalize anything this evening with Clinical research associatewriter.

## 2015-12-10 NOTE — Plan of Care (Signed)
Problem: Education: Goal: Emotional status will improve Outcome: Not Progressing Patient remains with thought blocking and selectively mute at times. Does well with support and encouragement.

## 2015-12-10 NOTE — [facility] Suicide Risk Assessment (Signed)
[facility] INPATIENT:  Family/Significant Other Suicide Prevention Education  Suicide Prevention Education:  Education Completed; [name] Scarlette (mother) [phone] has been identified by the patient as the family member/significant other with whom the patient will be residing, and identified as the person(s) who will aid the patient in the event of a mental health crisis (suicidal ideations/suicide attempt).  With written consent from the patient, the family member/significant other has been provided the following suicide prevention education, prior to the and/or following the discharge of the patient.  The suicide prevention education provided includes the following:  Suicide risk factors  Suicide prevention and interventions  National Suicide Hotline telephone number  [facility] assessment telephone number  [facility] Emergency Assistance 911  [facility] and/or Residential Mobile Crisis Unit telephone number  Request made of family/significant other to:  Remove weapons (e.g., guns, rifles, knives), all items previously/currently identified as safety concern.    Remove drugs/medications (over-the-counter, prescriptions, illicit drugs), all items previously/currently identified as a safety concern.  The family member/significant other verbalizes understanding of the suicide prevention education information provided.  The family member/significant other agrees to remove the items of safety concern listed above.  Sempra EnergyCandace L Jaselyn Nahm MSW, LCSWA  12/10/2015, 4:04 PM

## 2015-12-10 NOTE — Progress Notes (Signed)
Patient ID: Theresa [name], female   DOB: 1996/03/11, 20 y.o.   MRN: [id number]  [name] spoke with patient's mother. She visited the patient yesterday and reports slight improvement. She is requesting to stay with the patient in her room because the patient told her "I don't feel safe without you there." [name] explained [facility] visitation policy. She requested the patient be given "truth serum" to find out why this happened. [name] provided education about diagnosis and treatment plan provided by physician.   Rondall Allegraandace L Lilliah Priego, MSW, LCSWA  12/10/2015 10:55 AM

## 2015-12-10 NOTE — Progress Notes (Signed)
[facility] MD Progress Note  12/10/2015 6:12 PM Theresa [name]  MRN:  [id number]  Subjective:  Theresa [name] is a 20 year old female has episode of psychosis for the past couple of months. She was admitted to [facility] but was not treated as she refused all medications. The family tried to obtain guardianship to force treatment but the judge rejected their petition. The patient did poorly following discharge. She did not eat, sleep, talk, or shower. She experienced auditory and visual hallucinations and extreme paranoia. She thinks that her food is poisoned and is afraid of all electronic devices. She believes that she is calmer as implanted in her eyes and speakers in her ears. 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 She was treated with Ativan that had to be forced. She is no longer catatonic but speaks very little and seems very disorganized in her thinking. She drinks and sure of the year and has paranoid tray ordered. There is forced medications order. We initially started Ativan for catatonia and Haldol for psychosis. I will discontinue Haldol and start Zyprexa injections as she is of Ativan now. We will learn from the mother that the patient had additional traumatic event in which her friend died and the patient most likely feels guilty about. Has been in touch with the social worker here. She agreed that the patient will need for guardianship and will discuss it with the judge. This is especially important that this patient may be a candidate for ECT treatment. Her family is very supportive and involved. They have high hopes that this is an episode of bipolar disorder. Bipolar disorder presentation runs in the family and the patient mother was able to name several family members including her brother will responded well to Risperdal.  Patient seen. Followed up with her today. July 16. Patient would not talk to me at all. She did not make eye contact. She was awake in her bed when I came to speak to her  but was staring at the wall and did not move. She is not eating very much. Family called to report that it had been recommended that she take metformin while taking Zyprexa. After reviewing her blood sugars and her oral intake I am not going to start her on metformin out of concern that it could drop her blood sugar. I am going to have her glucose measured on a daily basis in the morning to see if it might be needed longer term. Principal Problem: Schizophrenia spectrum disorder with psychotic disorder type not yet determined [facility]) Diagnosis:   Patient Active Problem List   Diagnosis Date Noted  . Cannabis use disorder, moderate, dependence (HCC) [F12.20] 12/06/2015  . Schizophrenia spectrum disorder with psychotic disorder type not yet determined (HCC) [F20.89, F29] 12/05/2015  . Tobacco use disorder [F17.200] 12/05/2015  . 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 Migraine without aura [G43.009]    Total Time spent with patient: 20 minutes  Past Psychiatric History: Psychosis, depression.  Past Medical History:  Past Medical History  Diagnosis Date  . Polycystic ovary syndrome   . Abnormal vaginal Pap smear   . STD (sexually transmitted disease) 10-14-15    chlamydia   . Migraine without aura   . Anxiety   . Depression    History reviewed. No pertinent past surgical history. Family History:  Family History  Problem Relation Age of Onset  . Hypertension Mother   . Heart disease Mother   .  Cancer Mother   . Hypertension Other   . Depression Father   . Drug abuse Father   . Bipolar disorder Father   . Heart disease Maternal Grandmother   . Pancreatitis Maternal Grandfather   . Cancer Paternal Grandmother   . Cancer Paternal Grandfather    Family Psychiatric  History: Bipolar disorder Social History:  History  Alcohol Use No     History  Drug Use No    Social History   Social History  . Marital Status:  Single    Spouse Name: N/A  . Number of Children: N/A  . Years of Education: N/A   Social History Main Topics  . Smoking status: Current Every Day Smoker -- 0.50 packs/day    Types: Cigarettes  . Smokeless tobacco: Never Used  . Alcohol Use: No  . Drug Use: No  . Sexual Activity:    Partners: Male    Birth Control/ Protection: None   Other Topics Concern  . None   Social History Narrative   Additional Social History:                         Sleep: Fair  Appetite:  Poor  Current Medications: Current Facility-Administered Medications  Medication Dose Route Frequency Provider Last Rate Last Dose  . acetaminophen (TYLENOL) tablet 650 mg  650 mg Oral Q6H PRN Jimmy FootmanAndrea Hernandez-Gonzalez, MD   650 mg at 12/10/15 1749  . alum & mag hydroxide-simeth (MAALOX/MYLANTA) 200-200-20 MG/5ML suspension 30 mL  30 mL Oral Q4H PRN Jimmy FootmanAndrea Hernandez-Gonzalez, MD      . feeding supplement (ENSURE ENLIVE) (ENSURE ENLIVE) liquid 237 mL  237 mL Oral TID BM Jolanta B Pucilowska, MD   237 mL at 12/10/15 1400  . hydrOXYzine (ATARAX/VISTARIL) tablet 50 mg  50 mg Oral TID PRN Jimmy FootmanAndrea Hernandez-Gonzalez, MD      . LORazepam (ATIVAN) tablet 1 mg  1 mg Oral Q6H PRN Jolanta B Pucilowska, MD      . magnesium hydroxide (MILK OF MAGNESIA) suspension 30 mL  30 mL Oral Daily PRN Jimmy FootmanAndrea Hernandez-Gonzalez, MD      . OLANZapine (ZYPREXA) injection 10 mg  10 mg Intramuscular q morning - 10a Jolanta B Pucilowska, MD   10 mg at 12/10/15 0905  . OLANZapine zydis (ZYPREXA) disintegrating tablet 20 mg  20 mg Oral Daily Jolanta B Pucilowska, MD   20 mg at 12/08/15 1552  . traZODone (DESYREL) tablet 50 mg  50 mg Oral QHS PRN Jimmy FootmanAndrea Hernandez-Gonzalez, MD        Lab Results: No results found for this or any previous visit (from the past 48 hour(s)).  Blood Alcohol level:  Lab Results  Component Value Date   ETH <5 11/03/2015    Metabolic Disorder Labs: Lab Results  Component Value Date   HGBA1C 5.4  10/14/2015   Lab Results  Component Value Date   PROLACTIN 4.0 10/14/2015   Lab Results  Component Value Date   CHOL 141 11/06/2012   TRIG 44 11/06/2012   HDL 52 11/06/2012   LDLCALC 80 11/06/2012    Physical Findings: AIMS:  , ,  ,  , Dental Status Current problems with teeth and/or dentures?: No Does patient usually wear dentures?: No  CIWA:    COWS:     Musculoskeletal: Strength & Muscle Tone: within normal limits Gait & Station: normal Patient leans: N/A  Psychiatric Specialty Exam: Physical Exam  Nursing note and vitals reviewed.   Review of  Systems  Psychiatric/Behavioral: Positive for depression and hallucinations. The patient is nervous/anxious.   All other systems reviewed and are negative.   Blood pressure 109/54, pulse 65, temperature 98.5 F (36.9 C), temperature source Oral, resp. rate 18, height 5\' 3"  (1.6 m), weight 72.576 kg (160 lb), last menstrual period 11/07/2015, SpO2 99 %.Body mass index is 28.35 kg/(m^2).  General Appearance: Casual  Eye Contact:  Good  Speech:  Blocked  Volume:  Decreased  Mood:  Depressed  Affect:  Tearful  Thought Process:  Disorganized  Orientation:  Other:  To person and place only  Thought Content:  Delusions, Hallucinations: Auditory Visual and Paranoid Ideation  Suicidal Thoughts:  No  Homicidal Thoughts:  No  Memory:  Immediate;   Poor Recent;   Poor Remote;   Poor  Judgement:  Poor  Insight:  Lacking  Psychomotor Activity:  Psychomotor Retardation  Concentration:  Concentration: Poor and Attention Span: Poor  Recall:  Poor  Fund of Knowledge:  Poor  Language:  Poor  Akathisia:  No  Handed:  Right  AIMS (if indicated):     Assets:  Desire for Improvement Financial Resources/Insurance Housing Physical Health Resilience Social Support  ADL's:  Intact  Cognition:  Impaired,  Mild  Sleep:  Number of Hours: 7.15     Treatment Plan Summary: Daily contact with patient to assess and evaluate symptoms  and progress in treatment and Medication management   Theresa [name] is a 20 year old female with 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  1. Catatonia. Resolved. We will discontinue standing Ativan.   2. Psychosis. We will discontinue Haldol and start Zyprexa zydis. If she refuses, non emergency forced medications orders are in place.   3. Social. Her mother applied for the guardianship but the judge deny it. Her guardian ad litem to will try to obtain temporary guardianship instead.   4. Smoking. Nicotine patch is available.  5. Poor oral intake. Paranoid tray and Ensure is available.   6. Cannabis abuse. We'll address substance abuse when patient is better.  7. Insomnia. She is on trazodone.   8. ECT. I will ask Dr. Toni Amend for a consultation.   9. Disposition. She will be discharged to home with her mother. She will follow up with a local provider.   Patient was psychosis either psychotic depression or schizophrenia. Nearly catatonic. Barely interactive. Only getting medicine because of IM shots. Doesn't seem to be much better. I we will repeat that I would be happy to pursue ECT with this patient as I think there is an indication and low risk. Patient clearly is unable to give consent. If it looks like family would be willing and that guardianship is possible we could proceed at some point. No other change to medicine today. Mordecai Rasmussen, MD 12/10/2015, 6:12 PM

## 2015-12-10 NOTE — Progress Notes (Signed)
Theresa [name] remains flat on approach with, blank stare. No SI/HI at this time. She had no interaction with peers and staff. Alert, unwilling to state name and birth date. Patient still stating " I want to see my Mom today". Patient mother called and told the writer she wants doctor to ischarge her daughter home, mother seemed to be upset and wanted to speak with her doctor. Insrtucted her mother that Clinical research associatewriter would past message on the oncoming shift during report.

## 2015-12-10 NOTE — Progress Notes (Signed)
Birth control meds from home dropped off at pharmacy for identification purposes.
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
## 2015-12-10 NOTE — Progress Notes (Addendum)
Patient's mother and brother into visit. One to one with patient's mother and mother states " I would like a meeting with the doctor to talk about Recie's care. Patient Mother states "patient will make a scene when I leave". Mother, boyfriend and patient standing over safety line in front of unit door. Writer requests Mother, boyfriend and patient return to dayroom to end visit. Mother tells Clinical research associatewriter to walk off. Additional nurse for one on one with patient. Mother exists unit.

## 2015-12-10 NOTE — BHH Group Notes (Signed)
B[facility] LCSW Group Therapy  12/10/2015 1:29 PM  Type of Therapy:  Group Therapy  Participation Level:  Did Not Attend  Modes of Intervention:  Discussion, Education, Socialization and Support  Summary of Progress/Problems: Mindfulness: Patient discussed mindfulness and relaxing techniques and why they are beneficial. Pt discussed ways to incorporate mindfulness in their lives. Pt practiced a mindfulness techique and discussed how it made them feel.   Milika Ventress L Darina Hartwell MSW, LCSWA  12/10/2015, 1:29 PM  

## 2015-12-10 NOTE — Progress Notes (Signed)
Patient remains with sad affect, blank expression and selectively mute at times. Patient with no SI/HI/AVH at this time. Patient presents to med room on her own this morning, but is unable to take po meds. IM Zyprexa administered as ordered. Patient tolerates well. Patient showers this am. Mother phones and speaks one on one with social worker for plan of care review. Safety maintained.

## 2015-12-11 LAB — GLUCOSE, CAPILLARY: Glucose-Capillary: 104 mg/dL — ABNORMAL HIGH (ref 65–99)

## 2015-12-11 MED ORDER — LAMOTRIGINE 25 MG PO TABS
25.0000 mg | ORAL_TABLET | Freq: Every day | ORAL | Status: DC
  Administered 2015-12-11 – 2015-12-12 (×2): 25 mg via ORAL
  Filled 2015-12-11 (×2): qty 1

## 2015-12-11 MED ORDER — OLANZAPINE 5 MG PO TBDP: 20.0000 mg | ORAL_TABLET | Freq: Every day | ORAL | Status: DC

## 2015-12-11 NOTE — [facility] Group Notes (Signed)
[facility]   12/11/2015 1 PM  ?  Participation Quality: Alert, Appropriate and Oriented   Mood/Affect: Depressed and Flat   Thoughts of Suicide: Pt denies SI/HI   Will you contract for safety? Yes   Current AVH: Pt denies   Plan for Discharge/Comments: Pt attended discharge planning group and actively participated in group. [name] provided pt with today's workbook. Pt shared she had no plans for discharge.  Transportation Means: Pt reports access to transportation   Supports: No supports mentioned at this time     Dorothe PeaJonathan F. Trenita Hulme, MSW, LCSWA, LCAS

## 2015-12-11 NOTE — BHH Group Notes (Addendum)
B[facility] LCSW Group Therapy   12/11/2015 9:30am Type of Therapy: Group Therapy   Participation Level: Active   Participation Quality: Attentive, Sharing and Supportive   Affect: Depressed and Flat   Cognitive: Alert and Oriented   Insight: Developing/Improving and Engaged   Engagement in Therapy: Developing/Improving and Engaged   Modes of Intervention: Clarification, Confrontation, Discussion, Education, Exploration,  Limit-setting, Orientation, Problem-solving, Rapport Building, Dance movement psychotherapisteality Testing, Socialization and Support   Summary of Progress/Problems: Pt identified obstacles faced currently and processed barriers involved in overcoming these obstacles. Pt identified steps necessary for overcoming these obstacles and explored motivation (internal and external) for facing these difficulties head on. Pt further identified one area of concern in their lives and chose a goal to focus on for today.  Pt [place] there upon discharge.  Pt shared that the pt has chosen returning to school as a goal and that the pt was concerned about never being able to accomplish this.  Pt further shared that one barrier to the pt's goal is a lack of transportation.  Pt was polite and cooperative with the [name] and other group members and focused and attentive to the topics discussed and the sharing of others. Pt shared extensively during group, as evidenced by increased sharing, sharing at length when prompted and pt presents a happy and increasingly energetic, as compared to previous sessions.       Dorothe PeaJonathan F. Khaidyn Staebell, LCSWA, LCAS
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
## 2015-12-11 NOTE — BHH Group Notes (Signed)
B[facility] Group Notes:  (Nursing/MHT/Case Management/Adjunct)  Date:  12/11/2015  Time:  4:25 AM  Type of Therapy:  Psychoeducational Skills  Participation Level:  None  Participation Quality:  Attentive  Affect:  Flat  Cognitive:  Alert  Insight:  None  Engagement in Group:  None  Modes of Intervention:  Discussion, Socialization and Support  Summary of Progress/Problems: Patient decided to come to group did not participate, but attempted to listen and stay the entire time. Chancy MilroyLaquanda Y Elgie Maziarz 12/11/2015, 4:25 AM

## 2015-12-11 NOTE — Progress Notes (Signed)
Patient was smiling and had a conversation with staff today.She rated her depression 5/10 and anxiety 8/10.Patient denies suicidal ideation and hallucinations.Took PO medicine without any hesitation.Patient verbalized that she likes to go home.Attended groups.Minimal interactions with peers.Appetite good.

## 2015-12-11 NOTE — Progress Notes (Signed)
D: Observed pt standing by the nursing station. Patient alert and oriented to person, but it's difficult to say beyond that as pt does not verbalize much. Patient denies SI/HI/AVH. Pt affect is flat. Pt was able to remember writer and verbalize my name on introducing myself to pt, but pt would not be verbal other than that. Pt would stand and stare at Clinical research associatewriter when asked questions. Pt voiced no concerns, but Clinical research associatewriter was told by MHT that pt wanted to talk to mother. Pt ended up talking to mother on the phone. Pt voiced no concerns and no apparent distress was noted.  A: Offered active listening and support. Attempted to provide therapeutic communication. Gave pt ensure. Tried to provide comfort to pt. Reassured pt that she was in safe environment.    R: Pt cooperative. No medication scheduled this shift.Pt still appears confused and does not verbalize. Will continue Q15 min. checks. Safety maintained.

## 2015-12-11 NOTE — BHH Group Notes (Signed)
B[facility] Group Notes:  (Nursing/MHT/Case Management/Adjunct)  Date:  12/11/2015  Time:  3:27 PM  Type of Therapy:  Psychoeducational Skills  Participation Level:  Minimal  Participation Quality:  Attentive  Affect:  Flat and Labile  Cognitive:  Lacking  Insight:  Limited  Engagement in Group:  Limited  Modes of Intervention:  outside  Summary of Progress/Problems:  Theresa [name] 12/11/2015, 3:27 PM

## 2015-12-11 NOTE — Plan of Care (Signed)
Problem: Safety: Goal: Ability to remain free from injury will improve Outcome: Progressing Pt has remained free from injury this evening .    

## 2015-12-12 ENCOUNTER — Ambulatory Visit: Payer: BLUE CROSS/BLUE SHIELD | Admitting: Internal Medicine

## 2015-12-12 ENCOUNTER — Telehealth (HOSPITAL_COMMUNITY): Payer: Self-pay | Admitting: *Deleted

## 2015-12-12 LAB — LIPID PANEL
Cholesterol: 122 mg/dL (ref 0–200)
HDL: 46 mg/dL (ref >40)
LDL Cholesterol: 58 mg/dL (ref 0–99)
Total CHOL/HDL Ratio: 2.7 RATIO
Triglycerides: 88 mg/dL (ref <150)
VLDL: 18 mg/dL (ref 0–40)

## 2015-12-12 LAB — GLUCOSE, CAPILLARY: Glucose-Capillary: 96 mg/dL (ref 65–99)

## 2015-12-12 LAB — TSH: TSH: 2.141 u[IU]/mL (ref 0.350–4.500)

## 2015-12-12 MED ORDER — OLANZAPINE 20 MG PO TBDP: 20.0000 mg | ORAL_TABLET | Freq: Every day | ORAL | Status: DC

## 2015-12-12 MED ORDER — LAMOTRIGINE 25 MG PO TABS: 25.0000 mg | ORAL_TABLET | Freq: Every day | ORAL | Status: DC

## 2015-12-12 MED ORDER — OLANZAPINE 20 MG PO TABS: 20.0000 mg | ORAL_TABLET | Freq: Every day | ORAL | Status: DC

## 2015-12-12 MED ORDER — OLANZAPINE 10 MG PO TABS
20.0000 mg | ORAL_TABLET | Freq: Every day | ORAL | Status: DC
  Administered 2015-12-12: 20 mg via ORAL
  Filled 2015-12-12: qty 2

## 2015-12-12 NOTE — Progress Notes (Signed)
D: Patient is alert and oriented on the unit this shift. Patient not  Attend and  participated in groups today. Patient denies suicidal ideation, homicidal ideation, auditory or visual hallucinations at the present time.  A: Scheduled medications are administered to patient as per MD orders. Emotional support and encouragement are provided. Patient is maintained on q.15 minute safety checks. Patient is informed to notify staff with questions or concerns. R: No adverse medication reactions are noted. Patient is cooperative with medication administration and treatment plan today. Patient is receptive,  Anxious but cooperative on the unit at this time. Patient does not  Interact  with others on the unit this shift. Patient contracts for safety at this time. Patient remains safe at this time. Anxiety 7/10,depression 6/10

## 2015-12-12 NOTE — BHH Group Notes (Signed)
B[facility] Group Notes:  (Nursing/MHT/Case Management/Adjunct)  Date:  12/12/2015  Time:  2:23 AM  Type of Therapy:  Group Therapy  Participation Level:  Active  Participation Quality:  Appropriate  Affect:  Appropriate  Cognitive:  Appropriate  Insight:  Appropriate and Improving  Engagement in Group:  Developing/Improving and Engaged  Modes of Intervention:  Activity  Summary of Progress/Problems: Group was held outside. Pt stayed outside the entire group. Pt interacted with staff and fellow pt's.   Theresa [name] 12/12/2015, 2:23 AM

## 2015-12-12 NOTE — BHH Suicide Risk Assessment (Addendum)
[facility] Discharge Suicide Risk Assessment   Principal Problem: Schizophrenia spectrum disorder with psychotic disorder type not yet determined [facility](HCC) Discharge Diagnoses:  Patient Active Problem List   Diagnosis Date Noted  . Cannabis use disorder, moderate, dependence (HCC) [F12.20] 12/06/2015  . Schizophrenia spectrum disorder with psychotic disorder type not yet determined (HCC) [F20.89, F29] 12/05/2015  . Tobacco use disorder [F17.200] 12/05/2015  . 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 Migraine without aura [G43.009]     Total Time spent with patient: 30 minutes  Musculoskeletal: Strength & Muscle Tone: within normal limits Gait & Station: normal Patient leans: N/A  Psychiatric Specialty Exam: Review of Systems  Psychiatric/Behavioral: Positive for hallucinations.  All other systems reviewed and are negative.   Blood pressure 114/76, pulse 72, temperature 98.5 F (36.9 C), temperature source Oral, resp. rate 18, height 5\' 3"  (1.6 m), weight 72.576 kg (160 lb), last menstrual period 11/07/2015, SpO2 99 %.Body mass index is 28.35 kg/(m^2).  General Appearance: Casual  Eye Contact::  Good  Speech:  Clear and Coherent409  Volume:  Normal  Mood:  Euthymic  Affect:  Blunt  Thought Process:  Goal Directed  Orientation:  Full (Time, Place, and Person)  Thought Content:  WDL  Suicidal Thoughts:  No  Homicidal Thoughts:  No  Memory:  Immediate;   Fair Recent;   Fair Remote;   Fair  Judgement:  Impaired  Insight:  Shallow  Psychomotor Activity:  Normal  Concentration:  Fair  Recall:  [employer]  Fund of Knowledge:Fair  Language: Fair  Akathisia:  No  Handed:  Right  AIMS (if indicated):     Assets:  Communication Skills Desire for Improvement Financial Resources/Insurance Housing Physical Health Resilience Social Support Talents/Skills Transportation Vocational/Educational  Sleep:  Number of Hours: 7.45   Cognition: WNL  ADL's:  Intact   Mental Status Per Nursing Assessment::   On Admission:  NA  Demographic Factors:  Caucasian and Unemployed  Loss Factors: Decrease in vocational status  Historical Factors: Family history of mental illness or substance abuse and Impulsivity  Risk Reduction Factors:   Sense of responsibility to family, Living with another person, especially a relative and Positive social support  Continued Clinical Symptoms:  Bipolar Disorder:   Mixed State Alcohol/Substance Abuse/Dependencies  Cognitive Features That Contribute To Risk:  None    Suicide Risk:  Minimal: No identifiable suicidal ideation.  Patients presenting with no risk factors but with morbid ruminations; may be classified as minimal risk based on the severity of the depressive symptoms    Plan Of CarAs tolerated.ollow-up recommendations:  Activity:  As tolerated. Diet:  Regular. Other:  Keep follow-up appointments.  Kristine LineaJolanta Thien Berka, MD 12/12/2015, 11:48 AM

## 2015-12-12 NOTE — Plan of Care (Signed)
Problem: Coping: Goal: Ability to verbalize frustrations and anger appropriately will improve Outcome: Not Progressing  patient not able to cope appropriately due to not being able to use coping mechanisms [place] RN

## 2015-12-12 NOTE — [facility] Group Notes (Signed)
[facility] Group Notes:  (Nursing/MHT/Case Management/Adjunct)  Date:  12/12/2015  Time:  11:16 PM  Type of Therapy:  Psychoeducational Skills  Participation Level:  Did Not Attend   Benay PikeJamila A Ubaldo Daywalt 12/12/2015, 11:16 PM

## 2015-12-12 NOTE — Telephone Encounter (Signed)
left voice message regarding an appointment. 

## 2015-12-12 NOTE — [facility] Group Notes (Addendum)
Goals Group Date/Time: 12/12/15 9am Type of Therapy and Topic: Group Therapy: Goals Group: SMART Goals   Participation Level: Moderate  Description of Group:    The purpose of a daily goals group is to assist and guide patients in setting recovery/wellness-related goals. The objective is to set goals as they relate to the crisis in which they were admitted. Patients will be using SMART goal modalities to set measurable goals. Characteristics of realistic goals will be discussed and patients will be assisted in setting and processing how one will reach their goal. Facilitator will also assist patients in applying interventions and coping skills learned in psycho-education groups to the SMART goal and process how one will achieve defined goal.   Therapeutic Goals:   -Patients will develop and document one goal related to or their crisis in which brought them into treatment.  -Patients will be guided by LCSW using SMART goal setting modality in how to set a measurable, attainable, realistic and time sensitive goal.  -Patients will process barriers in reaching goal.  -Patients will process interventions in how to overcome and successful in reaching goal.   Patient's Goal: Pt shared that the pt's goal is to discharge to [place] and spend time coming up with a good plan.  Pt has not thought of one yet.  Pt does hope to think of an attainable SMART goal. Pt was polite and cooperative with the [name] and other group members and focused and attentive to the topics discussed and the sharing of others.  Pt seems to have made great improvement during group, as evidenced by a pleasant affect, answering when prompted and pt presents as increasingly energetic, as compared to previous sessions.      Therapeutic Modalities:  Motivational Interviewing  Research officer, political partyCognitive Behavioral Therapy  Crisis Intervention Model  SMART goals setting   Dorothe PeaJonathan F. Rosalynd Mcwright, LCSWA, LCAS

## 2015-12-12 NOTE — [facility] Group Notes (Signed)
[facility] LCSW Group Therapy   12/12/2015 9:30am  Type of Therapy: Group Therapy   Participation Level: Active   Participation Quality: Attentive, Sharing and Supportive   Affect: Appropriate  Cognitive: Alert and Oriented   Insight: Developing/Improving and Engaged   Engagement in Therapy: Developing/Improving and Engaged   Modes of Intervention: Clarification, Confrontation, Discussion, Education, Exploration,  Limit-setting, Orientation, Problem-solving, Rapport Building, Dance movement psychotherapisteality Testing, Socialization and Support  Summary of Progress/Problems: The topic for group therapy was feelings about diagnosis. Pt actively participated in group discussion on their past and current diagnosis and how they feel towards this. Pt also identified how society and family members judge them, based on their diagnosis as well as stereotypes and stigmas. Pt shared the pt's diagnosis is bi-polar and that this was disconcerting to the pt.  Pt shared the pt did not know what plan to take to treat the pt's diagnosis.  Pt was polite and cooperative with the [name] and other group members and focused and attentive to the topics discussed and the sharing of others.      Dorothe PeaJonathan F. Tian Mcmurtrey, LCSWA, LCAS

## 2015-12-12 NOTE — Progress Notes (Addendum)
[facility] MD Progress Note  12/12/2015 9:05 AM [name]  MRN:  [id number]  Subjective:  [name] continues to improve. She is able to engage during the interview. She is pleasant polite and cooperated. She takes medications as prescribed. She went to groups yesterday and was able to participate appropriately. She disclosed to her mother that she still feels somewhat paranoid and has voices but her thinking is no longer disorganized. She sleeps and eats well. Her hygiene is perfect. Unfortunately both the patient and her mother questioned the usefulness of medications and inability that Zyprexa makes the patient hallucinate. The family would like the patient to be discharged soon. They will follow up with a psychiatrist familiar to the family already takes care of her brother.  Principal Problem: Schizophrenia spectrum disorder with psychotic disorder type not yet determined [facility]) Diagnosis:   Patient Active Problem List   Diagnosis Date Noted  . Cannabis use disorder, moderate, dependence (HCC) [F12.20] 12/06/2015  . Schizophrenia spectrum disorder with psychotic disorder type not yet determined (HCC) [F20.89, F29] 12/05/2015  . Tobacco use disorder [F17.200] 12/05/2015  . 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 Migraine without aura [G43.009]    Total Time spent with patient: 20 minutes  Past Psychiatric History: Depression, anxiety, psychosis.  Past Medical History:  Past Medical History  Diagnosis Date  . Polycystic ovary syndrome   . Abnormal vaginal Pap smear   . STD (sexually transmitted disease) 10-14-15    chlamydia   . Migraine without aura   . Anxiety   . Depression    History reviewed. No pertinent past surgical history. Family History:  Family History  Problem Relation Age of Onset  . Hypertension Mother   . Heart disease Mother   . Cancer Mother   . Hypertension Other   . Depression Father   .  Drug abuse Father   . Bipolar disorder Father   . Heart disease Maternal Grandmother   . Pancreatitis Maternal Grandfather   . Cancer Paternal Grandmother   . Cancer Paternal Grandfather    Family Psychiatric  History: Bipolar disorder. Social History:  History  Alcohol Use No     History  Drug Use No    Social History   Social History  . Marital Status: Single    Spouse Name: N/A  . Number of Children: N/A  . Years of Education: N/A   Social History Main Topics  . Smoking status: Current Every Day Smoker -- 0.50 packs/day    Types: Cigarettes  . Smokeless tobacco: Never Used  . Alcohol Use: No  . Drug Use: No  . Sexual Activity:    Partners: Male    Birth Control/ Protection: None   Other Topics Concern  . None   Social History Narrative   Additional Social History:                         Sleep: Fair  Appetite:  Fair  Current Medications: Current Facility-Administered Medications  Medication Dose Route Frequency Provider Last Rate Last Dose  . acetaminophen (TYLENOL) tablet 650 mg  650 mg Oral Q6H PRN Jimmy Footman, MD   650 mg at 12/11/15 2001  . alum & mag hydroxide-simeth (MAALOX/MYLANTA) 200-200-20 MG/5ML suspension 30 mL  30 mL Oral Q4H PRN Jimmy Footman, MD      . feeding supplement (ENSURE ENLIVE) (ENSURE ENLIVE) liquid 237 mL  237 mL Oral TID  BM Shari ProwsJolanta B Leatrice Parilla, MD   237 mL at 12/11/15 2138  . hydrOXYzine (ATARAX/VISTARIL) tablet 50 mg  50 mg Oral TID PRN Jimmy FootmanAndrea Hernandez-Gonzalez, MD      . lamoTRIgine (LAMICTAL) tablet 25 mg  25 mg Oral QHS Shari ProwsJolanta B Mily Malecki, MD   25 mg at 12/11/15 2150  . LORazepam (ATIVAN) tablet 1 mg  1 mg Oral Q6H PRN Theodus Ran B Dandra Velardi, MD      . magnesium hydroxide (MILK OF MAGNESIA) suspension 30 mL  30 mL Oral Daily PRN Jimmy FootmanAndrea Hernandez-Gonzalez, MD      . OLANZapine (ZYPREXA) injection 10 mg  10 mg Intramuscular q morning - 10a Wanita Derenzo B Kennis Wissmann, MD   10 mg at 12/10/15 0905  .  OLANZapine zydis (ZYPREXA) disintegrating tablet 20 mg  20 mg Oral QHS Peniel Hass B Rohan Juenger, MD      . traZODone (DESYREL) tablet 50 mg  50 mg Oral QHS PRN Jimmy FootmanAndrea Hernandez-Gonzalez, MD        Lab Results:  Results for orders placed or performed during the hospital encounter of 12/05/15 (from the past 48 hour(s))  Glucose, capillary     Status: Abnormal   Collection Time: 12/11/15  6:41 AM  Result Value Ref Range   Glucose-Capillary 104 (H) 65 - 99 mg/dL  Glucose, capillary     Status: None   Collection Time: 12/12/15  6:45 AM  Result Value Ref Range   Glucose-Capillary 96 65 - 99 mg/dL    Blood Alcohol level:  Lab Results  Component Value Date   ETH <5 11/03/2015    Metabolic Disorder Labs: Lab Results  Component Value Date   HGBA1C 5.4 10/14/2015   Lab Results  Component Value Date   PROLACTIN 4.0 10/14/2015   Lab Results  Component Value Date   CHOL 141 11/06/2012   TRIG 44 11/06/2012   HDL 52 11/06/2012   LDLCALC 80 11/06/2012    Physical Findings: AIMS: Facial and Oral Movements Muscles of Facial Expression: None, normal Lips and Perioral Area: None, normal Jaw: None, normal Tongue: None, normal,Extremity Movements Upper (arms, wrists, hands, fingers): None, normal Lower (legs, knees, ankles, toes): None, normal, Trunk Movements Neck, shoulders, hips: None, normal, Overall Severity Severity of abnormal movements (highest score from questions above): None, normal Incapacitation due to abnormal movements: None, normal Patient's awareness of abnormal movements (rate only patient's report): No Awareness, Dental Status Current problems with teeth and/or dentures?: No Does patient usually wear dentures?: No  CIWA:    COWS:     Musculoskeletal: Strength & Muscle Tone: within normal limits Gait & Station: normal Patient leans: N/A  Psychiatric Specialty Exam: Physical Exam  Nursing note and vitals reviewed.   Review of Systems  Psychiatric/Behavioral:  Positive for hallucinations.  All other systems reviewed and are negative.   Blood pressure 114/76, pulse 72, temperature 98.5 F (36.9 C), temperature source Oral, resp. rate 18, height 5\' 3"  (1.6 m), weight 72.576 kg (160 lb), last menstrual period 11/07/2015, SpO2 99 %.Body mass index is 28.35 kg/(m^2).  General Appearance: Casual  Eye Contact:  Good  Speech:  Clear and Coherent  Volume:  Normal  Mood:  Euthymic  Affect:  Appropriate  Thought Process:  Goal Directed  Orientation:  Full (Time, Place, and Person)  Thought Content:  Illogical, Delusions, Hallucinations: Auditory and Paranoid Ideation  Suicidal Thoughts:  No  Homicidal Thoughts:  No  Memory:  Immediate;   Fair Recent;   Fair Remote;   Fair  Judgement:  Impaired  Insight:  Shallow  Psychomotor Activity:  Normal  Concentration:  Concentration: Fair and Attention Span: Fair  Recall:  [employer] of Knowledge:  Fair  Language:  Fair  Akathisia:  No  Handed:  Right  AIMS (if indicated):     Assets:  Communication Skills Desire for Improvement Financial Resources/Insurance Housing Physical Health Resilience Social Support Talents/Skills Transportation Vocational/Educational  ADL's:  Intact  Cognition:  WNL  Sleep:  Number of Hours: 7.45     Treatment Plan Summary: Daily contact with patient to assess and evaluate symptoms and progress in treatment and Medication management   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  1. Catatonia. Resolved. We discontinued Ativan.   2. Psychosis. We continue oral Zyprxa for psychosis. I discontinue Zyprexa injections. We started Lamictal for mood stabilization.  3. Insomnia. She is on trazodone.   4. Smoking. Nicotine patch is available.  5. Poor oral intake. Paranoid tray and Ensure is available.   6. Cannabis abuse. The patient minimizes her problems and declines treatment.   7. Metabolic syndromes screening. Lipid  profile, TSH, hemoglobin A1c and prolactin are pending.   8. Disposition. She will be discharged to home with her mother. She will follow up with a local provider.  Kristine Linea, MD 12/12/2015, 9:05 AM

## 2015-12-12 NOTE — Discharge Summary (Signed)
Physician Discharge Summary Note  Patient:  Theresa [name] is an 20 y.o., female MRN:  [id number] DOB:  08/02/1995 Patient phone:  [phone] (home)  Patient address:   [address] [place] [address],  Total Time spent with patient: 30 minutes  Date of Admission:  12/05/2015 Date of Discharge: 12/13/2015  Reason for Admission:  Psychotic break.  Identifying data. Theresa [name] is a 20 year old female with new onset psychosis.  Chief complaint. The patient is unable to state.  History of present illness. Information was obtained from the chart and her mother. The patient is catatonic and mute. She is unable to provide any information. She communicates by nodding her head. Apparently there is no history of mental illness but or the past month the patient has been increasingly psychotic and strange. She reported visual and auditory hallucinations to her mother, she believes that there are cameras implanted in her eyes and recording devices in her ears. At times she would not recognize her brother or boyfriend. During guardianship hearing she took the judge for her father. She was laughing and giggling inappropriately. She reported visual hallucinations to her mother as well. The patient stopped taking care of her apartment, showering and eating and became mute. [facility] or drink. Per Mother's report the patient did not receive any medications at [facility] as she was refusing. She was discharged with no medications. She spent last few days at her mother's house. She was able to drink and eat a little. The mother reports severe anxiety. In June she was prescribed Prozac for anxiety by her gynecologist. She had major anxiety attack following her first dose and ended up in the emergency room. The patient was positive for cannabis on admission. The family denies any alcohol or substance use.  Past  psychiatric history. She was never hospitalized, no suicide attempts no symptoms or treatment up until June 2017 when she received Prozac.  Family psychiatric history. There are multiple family members including her grandmother, her aunt, and her brother who experience psychotic episodes. Her aunt was at some point hospitalized for a month for psychotic symptoms that were eventually helped with Depakote. Multiple family members received treatment with well but some of them were allergic to it as it caused swelling. Her brother who was diagnosed with ADHD responded well to Risperdal during brief psychotic episode. There were reportedly no suicides in the family.  Social history. Prior to her illness, the patient was living independently and had a job at [employer] Lately there was some work related stress as she was asked to do that job of her coworker who was fired. She has a boyfriend. The family attempted to obtain guardianship but the judge rejected their petition.  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 Cannabis use disorder, moderate, dependence (HCC) [F12.20] 12/06/2015  . Schizophrenia spectrum disorder with psychotic disorder type not yet determined (HCC) [F20.89, F29] 12/05/2015  . Tobacco use disorder [F17.200] 12/05/2015  . Migraine without aura [G43.009]    Past Medical History:  Past Medical History  Diagnosis Date  . Polycystic ovary syndrome   . Abnormal vaginal Pap smear   . STD (sexually transmitted disease) 10-14-15    chlamydia   .  Migraine without aura   . Anxiety   . Depression    History reviewed. No pertinent past surgical history. Family History:  Family History  Problem Relation Age of Onset  . Hypertension Mother   . Heart disease  Mother   . Cancer Mother   . Hypertension Other   . Depression Father   . Drug abuse Father   . Bipolar disorder Father   . Heart disease Maternal Grandmother   . Pancreatitis Maternal Grandfather   . Cancer Paternal Grandmother   . Cancer Paternal Grandfather    Social History:  History  Alcohol Use No     History  Drug Use No    Social History   Social History  . Marital Status: Single    Spouse Name: N/A  . Number of Children: N/A  . Years of Education: N/A   Social History Main Topics  . Smoking status: Current Every Day Smoker -- 0.50 packs/day    Types: Cigarettes  . Smokeless tobacco: Never Used  . Alcohol Use: No  . Drug Use: No  . Sexual Activity:    Partners: Male    Birth Control/ Protection: None   Other Topics Concern  . 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  1. Catatonia. Resolved.    2. Psychosis. We started Zyprexa for psychosis and added Lamictal for further mood stabilization. Unfortunately both the patient and her mother question usefulness of medications and are suspicious that "medicines cause hallucinations".   3. Insomnia. Trazodone was available.  4. Smoking. Nicotine patch was available.  5. Poor oral intake. Paranoid tray and Ensure was available.   6. Cannabis abuse. The patient minimizes her problems and declines treatment.   7. Metabolic syndromes screening. Lipid profile andTSH are normal. Hemoglobin A1c and prolactin are pending.   8. Disposition. She was discharged to home with her mother. She will follow up with a ne psychiatrist at [facility] in [place].   Physical Findings: AIMS: Facial and Oral Movements Muscles of Facial Expression: None, normal Lips and Perioral Area: None, normal Jaw: None, normal Tongue: None, normal,Extremity Movements Upper (arms, wrists, hands, fingers): None, normal Lower (legs, knees,  ankles, toes): None, normal, Trunk Movements Neck, shoulders, hips: None, normal, Overall Severity Severity of abnormal movements (highest score from questions above): None, normal Incapacitation due to abnormal movements: None, normal Patient's awareness of abnormal movements (rate only patient's report): No Awareness, Dental Status Current problems with teeth and/or dentures?: No Does patient usually wear dentures?: No  CIWA:    COWS:     Musculoskeletal: Strength & Muscle Tone: within normal limits Gait & Station: normal Patient leans: N/A  Psychiatric Specialty Exam: Physical Exam  Nursing note and vitals reviewed.   Review of Systems  Psychiatric/Behavioral: Positive for hallucinations.  All other systems reviewed and are negative.   Blood pressure 114/76, pulse 72, temperature 98.5 F (36.9 C), temperature source Oral, resp. rate 18, height 5\' 3"  (1.6 m), weight 72.576 kg (160 lb), last menstrual period 11/07/2015, SpO2 99 %.Body mass index is 28.35 kg/(m^2).  See SRA.                                                  Sleep:  Number  of Hours: 7.45     Have you used any form of tobacco in the last 30 days? (Cigarettes, Smokeless Tobacco, Cigars, and/or Pipes): No  Has this patient used any form of tobacco in the last 30 days? (Cigarettes, Smokeless Tobacco, Cigars, and/or Pipes) Yes, Yes, A prescription for an FDA-approved tobacco cessation medication was offered at discharge and the patient refused  Blood Alcohol level:  Lab Results  Component Value Date   [facility] 11/03/2015    Metabolic Disorder Labs:  Lab Results  Component Value Date   HGBA1C 5.4 10/14/2015   Lab Results  Component Value Date   PROLACTIN 4.0 10/14/2015   Lab Results  Component Value Date   CHOL 122 12/12/2015   TRIG 88 12/12/2015   HDL 46 12/12/2015   CHOLHDL 2.7 12/12/2015   VLDL 18 12/12/2015   LDLCALC 58 12/12/2015   LDLCALC 80 11/06/2012    See Psychiatric  Specialty Exam and Suicide Risk Assessment completed by Attending Physician prior to discharge.  Discharge destination:  Home  Is patient on multiple antipsychotic therapies at discharge:  No   Has Patient had three or more failed trials of antipsychotic monotherapy by history:  No  Recommended Plan for Multiple Antipsychotic Therapies: NA  Discharge Instructions    Diet - low sodium heart healthy    Complete by:  As directed      Increase activity slowly    Complete by:  As directed             Medication List    STOP taking these medications        citalopram 20 MG tablet  Commonly known as:  CELEXA      TAKE these medications      Indication   lamoTRIgine 25 MG tablet  Commonly known as:  LAMICTAL  Take 1 tablet (25 mg total) by mouth at bedtime. Take 1 tab at night for 2 weeks, 2 tabs for 2 weeks, 4 tabs for 2 weeks, 8 tabs or 200 mg after.   Indication:  Manic-Depression     Norgestimate-Ethinyl Estradiol Triphasic 0.18/0.215/0.25 MG-25 MCG tab  Commonly known as:  ORTHO TRI-CYCLEN LO  Take 1 tablet by mouth daily.      OLANZapine 20 MG tablet  Commonly known as:  ZYPREXA  Take 1 tablet (20 mg total) by mouth at bedtime.   Indication:  Manic-Depression         Follow-up recommendations:  Activity:  As tolerated. Diet:  Regular. Other:  Keep follow-up appointments.  Comments:    Signed: Kristine LineaJolanta Tamisha Nordstrom, MD 12/12/2015, 11:54 AM

## 2015-12-12 NOTE — Telephone Encounter (Signed)
Phone call regarding appointment.  patient's mom said she is at [facility].

## 2015-12-13 ENCOUNTER — Other Ambulatory Visit: Payer: Self-pay

## 2015-12-13 MED ORDER — NORGESTIM-ETH ESTRAD TRIPHASIC 0.18/0.215/0.25 MG-25 MCG PO TABS: 1.0000 | ORAL_TABLET | Freq: Every day | ORAL | Status: DC

## 2015-12-13 NOTE — Telephone Encounter (Signed)
Please call the patient and or her mother, see if she is taking the pill. Can refill if needed. See how she is doing psychiatrically.

## 2015-12-13 NOTE — Telephone Encounter (Signed)
I spoke with mom and she is taking the OCP. She does need refilled.  Her mental status is much better now.

## 2015-12-13 NOTE — Tx Team (Signed)
Interdisciplinary Treatment Plan Update (Adult)  Date:  12/12/2015 Time Reviewed:  4:21 PM  Progress in Treatment: Attending groups: YES Participating in groups: MINIMALLY Taking medication as prescribed: No. and As evidenced by: Forced med order Tolerating medication: Yes. Family/Significant othe contact made: Yes, individual(s) contacted: mother  Patient understands diagnosis: No. and As evidenced by: Limited insight  Discussing patient identified problems/goals with staff: Yes. Medical problems stabilized or resolved: Yes. Denies suicidal/homicidal ideation: Yes. Issues/concerns per patient self-inventory: Yes. Other:  New problem(s) identified: No, Describe: NA  Discharge Plan or Barriers: Pt plans to return home and follow up with outpatient.   Reason for Continuation of Hospitalization: Delusions  Hallucinations Medication stabilization  Comments: [name] continues to improve. She is able to engage during the interview. She is pleasant polite and cooperated. She takes medications as prescribed. She went to groups yesterday and was able to participate appropriately. She disclosed to her mother that she still feels somewhat paranoid and has voices but her thinking is no longer disorganized. She sleeps and eats well. Her hygiene is perfect. Unfortunately both the patient and her mother questioned the usefulness of medications and inability that Zyprexa makes the patient hallucinate. The family would like the patient to be discharged soon. They will follow up with a psychiatrist familiar to the family already takes care of her brother.  Estimated length of stay: 1 day  New goal(s): NA  Review of initial/current patient goals per problem list:  1. Goal(s): Patient will participate in aftercare plan  Met:  Target date: at discharge  As evidenced by: Patient will participate within aftercare plan AEB aftercare provider and housing plan at discharge being  identified.  2. Goal (s): Patient will demonstrate decreased symptoms of psychosis.  Met:No  Target date: at discharge  As evidenced by: Patient will not endorse signs of psychosis or be deemed stable for discharge by MD. Attendees: Patient:  Theresa [name] 7/19/20174:21 PM  Family:   7/19/20174:21 PM  Physician:  Jolanta Pucilowska 7/19/20174:21 PM  Nursing:   Gwen Farrish, RN 7/19/20174:21 PM  Case Manager:   7/19/20174:21 PM  Counselor:   , LCSW 7/19/20174:21 PM  Other:   7/19/20174:21 PM  Other:   7/19/20174:21 PM  Other:   7/19/20174:21 PM  Other:  7/19/20174:21 PM  Other:  7/19/20174:21 PM  Other:  7/19/20174:21 PM  Other:  7/19/20174:21 PM  Other:  7/19/20174:21 PM  Other:  7/19/20174:21 PM  Other:   7/19/20174:21 PM   Scribe for Treatment Team:   ,  P, 12/13/2015, 4:21 PM, MSW, LCSW  

## 2015-12-13 NOTE — Progress Notes (Signed)
  [facility] Case Management Discharge Plan :  Will you be returning to the same living situation after discharge:  Yes,    At discharge, do you have transportation home?: Yes,    Do you have the ability to pay for your medications: Yes,     Release of information consent forms completed and in the chart;  Patient's signature needed at discharge.  Patient to Follow up at: Follow-up Information    Follow up with [facility] On 02/15/2016.   Specialty:  Behavioral Health   Why:  1:00pm for , Hospital Follow up, Outpatient Medication Management, Therapy, Please reschedule if unable to make appointment.   Contact information:   [address] [place] [id number] [phone]      Next level of care provider has [facility] to [facility]:yes  Safety Planning and Suicide Prevention discussed: Yes,     Have you used any form of tobacco in the last 30 days? (Cigarettes, Smokeless Tobacco, Cigars, and/or Pipes): No  Has patient been referred to the Quitline?: N/A patient is not a smoker  Patient has been referred for addiction treatment: Yes  Theresa [name], Cleda DaubSara P, MSW, LCSW 12/13/2015, 11:03 AM

## 2015-12-13 NOTE — Progress Notes (Signed)
Patient denies SI/HI, denies A/V hallucinations. Patient verbalizes understanding of discharge instructions, follow up care and prescriptions. Patient given all belongings from  locker. Patient escorted out by staff, transported by family. 

## 2015-12-13 NOTE — Telephone Encounter (Signed)
Medication refill request: NORG-EE 0.18-0.215-0.25/0.25 Last AEX:  10/18/15 office visit for dysmenorrhea, pelvic pain, lower abdominal pain Next AEX: 01/18/16 Theresa [name] Last MMG (if hormonal medication request): n/a Refill authorized: 10/18/15 #3packs w/0 refills. Today please advise

## 2016-01-18 ENCOUNTER — Ambulatory Visit (INDEPENDENT_AMBULATORY_CARE_PROVIDER_SITE_OTHER): Payer: Commercial Managed Care - HMO | Admitting: Internal Medicine

## 2016-01-18 ENCOUNTER — Telehealth: Payer: Self-pay | Admitting: Obstetrics and Gynecology

## 2016-01-18 ENCOUNTER — Encounter: Payer: Self-pay | Admitting: Obstetrics and Gynecology

## 2016-01-18 ENCOUNTER — Ambulatory Visit: Payer: Commercial Managed Care - HMO | Admitting: Obstetrics and Gynecology

## 2016-01-18 ENCOUNTER — Encounter: Payer: Self-pay | Admitting: Internal Medicine

## 2016-01-18 VITALS — BP 118/62 | HR 89 | Wt 184.0 lb

## 2016-01-18 DIAGNOSIS — E282 Polycystic ovarian syndrome: Secondary | ICD-10-CM

## 2016-01-18 MED ORDER — METFORMIN HCL ER 500 MG PO TB24: 1000.0000 mg | ORAL_TABLET | Freq: Two times a day (BID) | ORAL | 5 refills | Status: DC

## 2016-01-18 MED ORDER — NORGESTIMATE-ETH ESTRADIOL 0.25-35 MG-MCG PO TABS: 1.0000 | ORAL_TABLET | Freq: Every day | ORAL | 3 refills | Status: DC

## 2016-01-18 NOTE — Progress Notes (Signed)
Patient ID: Theresa [name], female   DOB: [birth year], 20 y.o.   MRN: [id number]  HPI: Theresa [name] is a 20 y.o. female, referred by Dr. Clelia CroftShaw, for evaluation and management of PCOS.  She was dx with PCOS at 512-20 y/o. She had early menarche (at 20 y/o), heavy menses, lasting longer (14 days) hair loss, sweaty palms and feet, oily skin.  Of note, pt has a h/o schizophrenia >> recently admitted for psychosis ("I had a mental breakdown") in 11/2015. She is on Zyprexa and Lamictal.  Fertility/Menstrual cycles: - She always had irregular menses - + h/o ovarian cysts  - [place] - menarche at 89 y/o - children: 0 - miscarriages: 0 - contraception: She was on OCPs - Ortho Tri-Cyclen lo >> stopped in 05/2015. LMP lasted a week: 12/13/2015 at discharge.  Acne: - face, chest, shoulders  Hirsutism: - chin, upper lip, abd, legs, around nipple - plucking it  Weight gain: - 168 1 mo ago - 184 lbs.  - Zyprexa started 1.5 mo ago - no steroid use - no weight loss meds - Meals: - Breakfast: eggs, toast, cereals - Lunch: sandwich, chips - Dinner:  - Snacks: 3 - Exercise: started walking  Treatments tried: - she tried Metformin - was on this but stopped as she was told that this was a diabetes med - did not try Spironolactone - did not try [place] - now off OCPs (was on Yaz, Ortho Tricyclen Lo)  - Last thyroid tests: Lab Results  Component Value Date   TSH 2.141 12/12/2015   - Last set of lipids:    Component Value Date/Time   CHOL 122 12/12/2015 0731   CHOL 141 11/06/2012 1716   TRIG 88 12/12/2015 0731   TRIG 44 11/06/2012 1716   HDL 46 12/12/2015 0731   HDL 52 11/06/2012 1716   CHOLHDL 2.7 12/12/2015 0731   VLDL 18 12/12/2015 0731   LDLCALC 58 12/12/2015 0731   LDLCALC 80 11/06/2012 1716   - Last HbA1c: Lab Results  Component Value Date   HGBA1C 5.4 10/14/2015   She has FH of DM in father's part of the family: uncle.  ROS: Constitutional: + weight gain, +  increased appetite, no fatigue, no subjective hyperthermia/hypothermia, + excessive urination Eyes: no blurry vision, no xerophthalmia ENT: no sore throat, no nodules palpated in throat, no dysphagia/odynophagia, no hoarseness Cardiovascular: + occas. CP/SOB/palpitations/+ leg swelling Respiratory: no cough/SOB Gastrointestinal: no N/V/D/C Musculoskeletal: no muscle/joint aches Skin: + acne, + hair on face, + hair loss on scalp Neurological: no tremors/+ numbness upper L leg/no tingling/dizziness Psychiatric: + both: depression/anxiety  Past Medical History:  Diagnosis Date  . Abnormal vaginal Pap smear   . Anxiety   . Depression   . Migraine without aura   . Polycystic ovary syndrome   . STD (sexually transmitted disease) 10-14-15   chlamydia    No past surgical history.  Social History   Social History  . Marital status: Single    Spouse name: N/A  . Number of children: 0   Occupational History  . unemployed   Social History Main Topics  . Smoking status: Current Every Day Smoker    Packs/day: 0.50    Types: Cigarettes  . Smokeless tobacco: Never Used  . Alcohol use Yes, once a month  . Drug use: No  . Sexual activity: Yes    Partners: Male    Birth control/ protection: None   Current Outpatient Prescriptions on File Prior to Visit  Medication Sig Dispense Refill  . lamoTRIgine (LAMICTAL) 25 MG tablet Take 1 tablet (25 mg total) by mouth at bedtime. Take 1 tab at night for 2 weeks, 2 tabs for 2 weeks, 4 tabs for 2 weeks, 8 tabs or 200 mg after. 45 tablet 0  . OLANZapine (ZYPREXA) 20 MG tablet Take 1 tablet (20 mg total) by mouth at bedtime. 30 tablet 1   No current facility-administered medications on file prior to visit.    No Known Allergies Family History  Problem Relation Age of Onset  . Hypertension Mother   . Heart disease Mother   . Cancer Mother   . Hypertension Other   . Depression Father   . Drug abuse Father   . Bipolar disorder Father   .  Heart disease Maternal Grandmother   . Pancreatitis Maternal Grandfather   . Cancer Paternal Grandmother   . Cancer Paternal Grandfather    PE: BP 118/62 (BP Location: Left Arm, Patient Position: Sitting)   Pulse 89   Wt 184 lb (83.5 kg)   LMP 11/13/2015 (Approximate)   SpO2 97%   BMI 32.59 kg/m  Wt Readings from Last 3 Encounters:  01/18/16 184 lb (83.5 kg)  12/05/15 164 lb (74.4 kg)  12/03/15 164 lb (74.4 kg)   Constitutional: overweight, in NAD, no full supraclavicular fat pads Eyes: PERRLA, EOMI, no exophthalmos ENT: moist mucous membranes, no thyromegaly, no cervical lymphadenopathy Cardiovascular: RRR, No MRG Respiratory: CTA B Gastrointestinal: abdomen soft, NT, ND, BS+ Musculoskeletal: no deformities, strength intact in all 4 Skin: moist, warm; + acne spots on face, + few dark terminal hair shafts on chin, + blond vellum on sideburns, no skin tags, no acanthosis nigricans, no purple, wide, stretch marks Neurological: no tremor with outstretched hands, DTR normal in all 4  ASSESSMENT: 1. PCOS  PLAN: 1.  I had a long discussion with the patient about the fact that the PCOS is a misnomer, a patient does not necessarily have to have polycystic ovaries to be diagnosed with the disorder. This is of sum of several conditions, including:  weight gain  insulin resistance (and therefore a higher risk of developing diabetes later in life)  acne  hirsutism  irregular menstrual cycles  decreased fertility. - We also discussed about the fact that the treatment is usually targeted to addressing the problem that concerns the patient the most: acne/hirsutism, weight gain, or fertility, but there is no single treatment for PCOS. For her, her weight and her hirsutism are bothering her the most. She noticed that both hirsutism and acne increased after she stopped OCPs. Her weight increased significantly after starting Zyprexa 1.5 months ago (Approximately 20 pounds). Have an  appointment with her psychiatrist next month and will discuss medicines. - We discussed that the first-line therapy are oral contraceptives. She would like to continue with OCPs, however, I advised her to come back for labs in the third day of her menstrual cycle and then start Ortho Cyclen. We discussed about the triphasic OCPs are not as good as monophasic for PCOS. Therefore, will continue with the Ortho-Cyclen. She agrees with this. Since she is also concerned with her weight, she is interested to restart metformin; she remembers that she had a metallic taste in her mouth and dizziness previously on metformin, therefore, I will send metformin ER, and advised her to start on the low dose and increase to 2000 mg daily. We also discussed that if her hirsutism does not improve, we can add spironolactone. No  plans for pregnancy yet, but if she is unsuccessful in getting pregnant after one year after she starts trying, I could refer her to reproductive endocrinology. - We also discussed about alternative treatment for PCOS, and I suggested to look at the [facility] website and also look up resveratrol. I would not recommend to start this for now, though. - We also discussed about the need to lose weight to avoid metabolic complications down the road. She agrees with referral to nutrition. - I ordered the following labs: Orders Placed This Encounter  Procedures  . 17-Hydroxyprogesterone  . Androstenedione  . Estradiol  . Luteinizing hormone  . Follicle stimulating hormone  . Prolactin  . DHEA-sulfate  . Testosterone, Free, Total, SHBG  . hCG, serum, qualitative  . Amb ref to Medical Nutrition Therapy-MNT  Patient will return for these in the third day of her menstrual cycle or, if she does not start her cycle, then in 1 week I will addend the results when they become available. - I advised her to return in 6 months for another visit  - time spent with the patient: 1 hour, of which >50% was spent in  obtaining information about her symptoms, reviewing her previous labs, evaluations, and treatments, counseling her about her condition (please see the discussed topics above), and developing a plan to further investigate it; she had a number of questions which I addressed.  Carlus Pavlovristina Ameliah Baskins, MD PhD [facility]

## 2016-01-18 NOTE — Telephone Encounter (Signed)
Patient dnka her 3 month recheck today. I left a message for her to call and reschedule. [facility] followed.

## 2016-01-18 NOTE — Patient Instructions (Signed)
Please come back for labs in he 3rd day of your cycle.   Please start Ortho Cyclen after we draw labs.  Please start Metformin ER 500 mg with dinner x 4 days. If you tolerate this well, add another Metformin tablet (500 mg) with breakfast x 4 days. If you tolerate this well, add another metformin tablet with dinner (total 1000 mg) x 4 days. If you tolerate this well, add another metformin tablet with breakfast (total 1000 mg). Continue with 1000 mg of metformin 2x a day with breakfast and dinner.  Look up trans Resveratrol 1500 mcg.  Please look up the following website: [email or web address]  Schedule an appt with Theresa LevyLaura [name] upstairs.  Please come back for a follow-up appointment in 6 months.

## 2016-02-12 ENCOUNTER — Telehealth (HOSPITAL_COMMUNITY): Payer: Self-pay | Admitting: *Deleted

## 2016-02-12 NOTE — Telephone Encounter (Signed)
Called pt mobile number and message came up stating number is not a working number. Called pt home number and pt mother picked up. Asked pt mother to please have pt call office and mother stated okay. Pt mother then stated she do not have insurance and asked if this is BHOP and office was unable to respond to that question due to HIPAA. Staff just stat to please have pt call office and pt mother stated pt was asleep and will have her call office back.

## 2016-02-15 ENCOUNTER — Ambulatory Visit (HOSPITAL_COMMUNITY): Payer: Self-pay | Admitting: Psychiatry

## 2016-03-13 NOTE — Telephone Encounter (Signed)
Will close encounter

## 2016-03-13 NOTE — Telephone Encounter (Signed)
Patient has not called to reschedule her 3 month recheck after 2nd call to patient. Okay to close encounter?

## 2016-03-24 ENCOUNTER — Emergency Department (HOSPITAL_COMMUNITY)
Admission: EM | Admit: 2016-03-24 | Discharge: 2016-03-24 | Disposition: A | Discharge status: Home / Self Care | Payer: Commercial Managed Care - HMO | Attending: Emergency Medicine | Admitting: Emergency Medicine

## 2016-03-24 ENCOUNTER — Encounter (HOSPITAL_COMMUNITY): Payer: Self-pay

## 2016-03-24 DIAGNOSIS — J069 Acute upper respiratory infection, unspecified: Secondary | ICD-10-CM | POA: Insufficient documentation

## 2016-03-24 DIAGNOSIS — F17210 Nicotine dependence, cigarettes, uncomplicated: Secondary | ICD-10-CM | POA: Insufficient documentation

## 2016-03-24 DIAGNOSIS — R197 Diarrhea, unspecified: Secondary | ICD-10-CM | POA: Insufficient documentation

## 2016-03-24 MED ORDER — ONDANSETRON 4 MG PO TBDP: 4.0000 mg | ORAL_TABLET | Freq: Three times a day (TID) | ORAL | 0 refills | Status: DC | PRN

## 2016-03-24 MED ORDER — IBUPROFEN 800 MG PO TABS: 800.0000 mg | ORAL_TABLET | Freq: Three times a day (TID) | ORAL | 0 refills | Status: DC

## 2016-03-24 MED ORDER — BENZONATATE 100 MG PO CAPS: 100.0000 mg | ORAL_CAPSULE | Freq: Three times a day (TID) | ORAL | 0 refills | Status: DC

## 2016-03-24 NOTE — ED Provider Notes (Signed)
[facility]-EMER[facility]Y DEPT Provider Note   CSN: [id number] Arrival date & time: 03/24/16  1608  By signing my name below, I, [name], attest that this documentation has been prepared under the direction and in the presence of  Shawn Joy, PA-C. Electronically Signed: [name], ED Scribe. 03/24/16. 4:39 PM.  History   Chief Complaint Chief Complaint  Patient presents with  . Cough   The history is provided by the patient and medical records. No language interpreter was used.     HPI Comments:  Theresa [name] is a 20 y.o. female who presents to the Emergency Department complaining of congestion, sneezing, subjective fever, chills, body aches, sore throat and cough x 3 days.  Her cough began as a dry cough but progressed to a cough productive of green brown sputum. She also notes diarrhea and nausea. Pt tried 800 mg ibuprofen with some relief.  Her husband has similar symptoms that resolved in 3 days.  She denies vomiting, abdominal pain, shortness of breath, or any other complaints.     Past Medical History:  Diagnosis Date  . Abnormal vaginal Pap smear   . Anxiety   . Depression   . Migraine without aura   . Polycystic ovary syndrome   . STD (sexually transmitted disease) 10-14-15   chlamydia     Patient Active Problem List   Diagnosis Date Noted  . PCOS (polycystic ovarian syndrome) 01/18/2016  . 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 Cannabis use disorder, moderate, dependence (HCC) 12/06/2015  . Schizophrenia spectrum disorder with psychotic disorder type not yet determined 12/05/2015  . Tobacco use disorder 12/05/2015  . Migraine without aura     History reviewed. No pertinent surgical history.  OB History    Gravida Para Term Preterm AB Living   0 0 0 0 0 0   SAB TAB Ectopic Multiple Live Births   0 0 0 0         Home Medications    Prior to Admission medications   Medication Sig Start  Date End Date Taking? Authorizing Provider  benzonatate (TESSALON) 100 MG capsule Take 1 capsule (100 mg total) by mouth every 8 (eight) hours. 03/24/16   Shawn C Joy, PA-C  ibuprofen (ADVIL,MOTRIN) 800 MG tablet Take 1 tablet (800 mg total) by mouth 3 (three) times daily. 03/24/16   Shawn C Joy, PA-C  lamoTRIgine (LAMICTAL) 25 MG tablet Take 1 tablet (25 mg total) by mouth at bedtime. Take 1 tab at night for 2 weeks, 2 tabs for 2 weeks, 4 tabs for 2 weeks, 8 tabs or 200 mg after. 12/12/15   Shari ProwsJolanta B Pucilowska, MD  metFORMIN (GLUCOPHAGE-XR) 500 MG 24 hr tablet Take 2 tablets (1,000 mg total) by mouth 2 (two) times daily with a meal. 01/18/16   Carlus Pavlovristina Gherghe, MD  norgestimate-ethinyl estradiol (ORTHO-CYCLEN, 28,) 0.25-35 MG-MCG tablet Take 1 tablet by mouth daily. 01/18/16   Carlus Pavlovristina Gherghe, MD  OLANZapine (ZYPREXA) 20 MG tablet Take 1 tablet (20 mg total) by mouth at bedtime. 12/12/15   Shari ProwsJolanta B Pucilowska, MD  ondansetron (ZOFRAN ODT) 4 MG disintegrating tablet Take 1 tablet (4 mg total) by mouth every 8 (eight) hours as needed for nausea or vomiting. 03/24/16   Anselm PancoastShawn C Joy, PA-C    Family History Family History  Problem Relation Age of Onset  . Hypertension Mother   . Heart disease Mother   . Cancer Mother   . Hypertension Other   .  Depression Father   . Drug abuse Father   . Bipolar disorder Father   . Heart disease Maternal Grandmother   . Pancreatitis Maternal Grandfather   . Cancer Paternal Grandmother   . Cancer Paternal Grandfather     Social History Social History  Substance Use Topics  . Smoking status: Current Every Day Smoker    Packs/day: 0.50    Types: Cigarettes  . Smokeless tobacco: Never Used  . Alcohol use No     Allergies   Review of patient's allergies indicates no known allergies.   Review of Systems Review of Systems  Constitutional: Positive for chills.  HENT: Positive for congestion and sneezing. Negative for sore throat, trouble swallowing and  voice change.   Respiratory: Positive for cough. Negative for shortness of breath.   Cardiovascular: Negative for chest pain.  Gastrointestinal: Positive for diarrhea. Negative for abdominal pain and vomiting.  All other systems reviewed and are negative.    Physical Exam Updated Vital Signs BP 131/67 (BP Location: Left Arm)   Pulse 87   Temp 98.4 F (36.9 C) (Oral)   Resp 24   Ht 5\' 3"  (1.6 m)   Wt 185 lb (83.9 kg)   LMP 03/24/2016   SpO2 100%   BMI 32.77 kg/m   Physical Exam  Constitutional: She is oriented to person, place, and time. She appears well-developed and well-nourished. No distress.  HENT:  Head: Normocephalic and atraumatic.  Mouth/Throat: Oropharynx is clear and moist.  Eyes: Conjunctivae are normal.  Neck: Neck supple.  Cardiovascular: Normal rate, regular rhythm, normal heart sounds and intact distal pulses.   Pulmonary/Chest: Effort normal and breath sounds normal. No respiratory distress.  Abdominal: Soft. There is no tenderness. There is no guarding.  Musculoskeletal: She exhibits no edema.  Lymphadenopathy:    She has no cervical adenopathy.  Neurological: She is alert and oriented to person, place, and time.  Skin: Skin is warm and dry. She is not diaphoretic.  Psychiatric: She has a normal mood and affect. Her behavior is normal.  Nursing note and vitals reviewed.    ED Treatments / Results   DIAGNOSTIC STUDIES:  Oxygen Saturation is 100% on RA, NML by my interpretation.    COORDINATION OF CARE:  4:37 PM Discussed treatment plan with pt at bedside and pt agreed to plan.  Labs (all labs ordered are listed, but only abnormal results are displayed) Labs Reviewed - No data to display  EKG  EKG Interpretation None       Radiology No results found.  Procedures Procedures (including critical care time)  Medications Ordered in ED Medications - No data to display   Initial Impression / Assessment and Plan / ED Course  I have  reviewed the triage vital signs and the nursing notes.  Pertinent labs & imaging results that were available during my care of the patient were reviewed by me and considered in my medical decision making (see chart for details).  Clinical Course    Patient presents with symptoms of URI and likely viral syndrome. No indication for emergent imaging at this time. Patient is nontoxic appearing, afebrile, not tachycardic, not tachypneic, not hypotensive, maintains SPO2 of 100% on room air, and is in no apparent distress. Patient has no signs of sepsis or other serious or life-threatening condition. Symptomatic care and return precautions discussed. Patient to follow up with a PCP.   Final Clinical Impressions(s) / ED Diagnoses   Final diagnoses:  Upper respiratory tract infection, unspecified type  New Prescriptions Discharge Medication List as of 03/24/2016  4:38 PM    START taking these medications   Details  benzonatate (TESSALON) 100 MG capsule Take 1 capsule (100 mg total) by mouth every 8 (eight) hours., Starting Sun 03/24/2016, Print    ibuprofen (ADVIL,MOTRIN) 800 MG tablet Take 1 tablet (800 mg total) by mouth 3 (three) times daily., Starting Sun 03/24/2016, Print    ondansetron (ZOFRAN ODT) 4 MG disintegrating tablet Take 1 tablet (4 mg total) by mouth every 8 (eight) hours as needed for nausea or vomiting., Starting Sun 03/24/2016, Print       I personally performed the services described in this documentation, which was scribed in my presence. The recorded information has been reviewed and is accurate.     Anselm Pancoast, PA-C 03/24/16 1852    Raeford Razor, MD 04/01/16 1114

## 2016-03-24 NOTE — ED Triage Notes (Signed)
Reports of body aches, nasal drainage and cough for several days. Denies fever.

## 2016-03-24 NOTE — Discharge Instructions (Signed)
Your symptoms are consistent with a viral illness. Viruses do not require antibiotics. Treatment is symptomatic care and it is important to note that these symptoms may last for 7-10 days. Drink plenty of fluids and get plenty of rest. You should be drinking at least a one half to one liter of water an hour to stay hydrated. Ibuprofen, Naproxen, or Tylenol for pain or fever. Zofran for nausea. Tessalon for cough. Plain Mucinex may help relieve congestion. Warm liquids or Chloraseptic spray may help soothe a sore throat. °

## 2016-04-26 ENCOUNTER — Emergency Department (HOSPITAL_COMMUNITY): Payer: Commercial Managed Care - HMO

## 2016-04-26 ENCOUNTER — Emergency Department (HOSPITAL_COMMUNITY)
Admission: EM | Admit: 2016-04-26 | Discharge: 2016-04-26 | Disposition: A | Discharge status: Home / Self Care | Payer: Commercial Managed Care - HMO | Attending: Emergency Medicine | Admitting: Emergency Medicine

## 2016-04-26 ENCOUNTER — Encounter (HOSPITAL_COMMUNITY): Payer: Self-pay | Admitting: *Deleted

## 2016-04-26 DIAGNOSIS — M545 Low back pain, unspecified: Secondary | ICD-10-CM

## 2016-04-26 DIAGNOSIS — Z7984 Long term (current) use of oral hypoglycemic drugs: Secondary | ICD-10-CM | POA: Insufficient documentation

## 2016-04-26 DIAGNOSIS — Z791 Long term (current) use of non-steroidal anti-inflammatories (NSAID): Secondary | ICD-10-CM | POA: Insufficient documentation

## 2016-04-26 DIAGNOSIS — R600 Localized edema: Secondary | ICD-10-CM | POA: Insufficient documentation

## 2016-04-26 DIAGNOSIS — R609 Edema, unspecified: Secondary | ICD-10-CM

## 2016-04-26 DIAGNOSIS — Z79899 Other long term (current) drug therapy: Secondary | ICD-10-CM | POA: Insufficient documentation

## 2016-04-26 DIAGNOSIS — F17210 Nicotine dependence, cigarettes, uncomplicated: Secondary | ICD-10-CM | POA: Insufficient documentation

## 2016-04-26 DIAGNOSIS — K839 Disease of biliary tract, unspecified: Secondary | ICD-10-CM | POA: Insufficient documentation

## 2016-04-26 HISTORY — DX: Schizophrenia, unspecified: F20.9

## 2016-04-26 HISTORY — DX: Bipolar disorder, unspecified: F31.9

## 2016-04-26 HISTORY — DX: Unspecified psychosis not due to a substance or known physiological condition: F29

## 2016-04-26 LAB — BASIC METABOLIC PANEL
Anion gap: 7 (ref 5–15)
BUN: 13 mg/dL (ref 6–20)
CO2: 30 mmol/L (ref 22–32)
Calcium: 9.1 mg/dL (ref 8.9–10.3)
Chloride: 99 mmol/L — ABNORMAL LOW (ref 101–111)
Creatinine, Ser: 0.76 mg/dL (ref 0.44–1.00)
GFR calc Af Amer: >60 mL/min (ref >60)
GFR calc non Af Amer: >60 mL/min (ref >60)
Glucose, Bld: 100 mg/dL — ABNORMAL HIGH (ref 65–99)
Potassium: 3.7 mmol/L (ref 3.5–5.1)
Sodium: 136 mmol/L (ref 135–145)

## 2016-04-26 LAB — URINALYSIS, ROUTINE W REFLEX MICROSCOPIC
Bilirubin Urine: NEGATIVE
Glucose, UA: NEGATIVE mg/dL
Hgb urine dipstick: NEGATIVE
Ketones, ur: NEGATIVE mg/dL
Leukocytes, UA: NEGATIVE
Nitrite: NEGATIVE
Protein, ur: NEGATIVE mg/dL
Specific Gravity, Urine: 1.01 (ref 1.005–1.030)
pH: 7 (ref 5.0–8.0)

## 2016-04-26 LAB — CBC WITH DIFFERENTIAL/PLATELET
Basophils Absolute: 0 10*3/uL (ref 0.0–0.1)
Basophils Relative: 0 %
Eosinophils Absolute: 0.2 10*3/uL (ref 0.0–0.7)
Eosinophils Relative: 2 %
HCT: 38.7 % (ref 36.0–46.0)
Hemoglobin: 13.1 g/dL (ref 12.0–15.0)
Lymphocytes Relative: 37 %
Lymphs Abs: 3.4 10*3/uL (ref 0.7–4.0)
MCH: 30.5 pg (ref 26.0–34.0)
MCHC: 33.9 g/dL (ref 30.0–36.0)
MCV: 90.2 fL (ref 78.0–100.0)
Monocytes Absolute: 0.5 10*3/uL (ref 0.1–1.0)
Monocytes Relative: 6 %
Neutro Abs: 5.2 10*3/uL (ref 1.7–7.7)
Neutrophils Relative %: 55 %
Platelets: 282 10*3/uL (ref 150–400)
RBC: 4.29 MIL/uL (ref 3.87–5.11)
RDW: 13.1 % (ref 11.5–15.5)
WBC: 9.4 10*3/uL (ref 4.0–10.5)

## 2016-04-26 LAB — D-DIMER, QUANTITATIVE (NOT AT ARMC): D-Dimer, Quant: 0.39 ug/mL-FEU (ref 0.00–0.50)

## 2016-04-26 LAB — PREGNANCY, URINE: Preg Test, Ur: NEGATIVE

## 2016-04-26 MED ORDER — DIAZEPAM 5 MG/ML IJ SOLN
10.0000 mg | Freq: Once | INTRAMUSCULAR | Status: AC
  Administered 2016-04-26: 10 mg via INTRAMUSCULAR
  Filled 2016-04-26: qty 2

## 2016-04-26 MED ORDER — CYCLOBENZAPRINE HCL 10 MG PO TABS: 10.0000 mg | ORAL_TABLET | Freq: Three times a day (TID) | ORAL | 0 refills | Status: DC | PRN

## 2016-04-26 MED ORDER — FUROSEMIDE 10 MG/ML IJ SOLN
60.0000 mg | Freq: Once | INTRAMUSCULAR | Status: AC
  Administered 2016-04-26: 60 mg via INTRAMUSCULAR
  Filled 2016-04-26: qty 6

## 2016-04-26 MED ORDER — NAPROXEN 500 MG PO TABS: ORAL_TABLET | ORAL | 0 refills | Status: DC

## 2016-04-26 MED ORDER — TRAMADOL HCL 50 MG PO TABS: 100.0000 mg | ORAL_TABLET | Freq: Four times a day (QID) | ORAL | 0 refills | Status: DC | PRN

## 2016-04-26 MED ORDER — METHOCARBAMOL 500 MG PO TABS
1000.0000 mg | ORAL_TABLET | Freq: Once | ORAL | Status: AC
  Administered 2016-04-26: 1000 mg via ORAL
  Filled 2016-04-26: qty 2

## 2016-04-26 MED ORDER — ACETAMINOPHEN 500 MG PO TABS
1000.0000 mg | ORAL_TABLET | Freq: Once | ORAL | Status: AC
  Administered 2016-04-26: 1000 mg via ORAL
  Filled 2016-04-26: qty 2

## 2016-04-26 NOTE — Discharge Instructions (Signed)
Take your prescriptions as previously directed.  Apply moist heat or ice to the area(s) of discomfort, for 15 minutes at a time, several times per day for the next few days.  Do not fall asleep on a heating or ice pack.  Call your regular medical doctor on Monday to schedule a follow up appointment this week.  Return to the Emergency Department immediately if worsening.

## 2016-04-26 NOTE — ED Triage Notes (Signed)
Pt reports lower back pain for several days, and lower extremity swelling.  Pt denies trauma or injury to her back.

## 2016-04-26 NOTE — ED Triage Notes (Signed)
Pt comes with bilateral leg swelling starting 2 days ago. She was seen here yesterday and discharged. Pt states swelling has not gotten better. In addition, pt has back pain.

## 2016-04-26 NOTE — ED Provider Notes (Signed)
[facility] DEPT Provider Note   CSN: [id number] Arrival date & time: 04/26/16  1634     History   Chief Complaint Chief Complaint  Patient presents with  . Leg Swelling  . Back Pain    HPI Theresa [name] is a 20 y.o. female.  HPI  Pt was seen at 1805. Per pt, c/o gradual onset and persistence of constant low back "pain" for the past 3 days. Pt describes the pain as "sore" and "muscles feel tight." Pain worsens with palpitation of the area and body position changes.  Denies incont/retention of bowel or bladder, no saddle anesthesia, no focal motor weakness, no tingling/numbness in extremities, no fevers, no injury. Pt also c/o bilat feet "swelling" for the past 2 to 3 days. States she "was taking motrin and alleve" for the pain in her back before the swelling began. Pt was evaluated in the ED last night for these symptoms with reassuring workup, received injection of lasix, dx musculoskeletal pain, rx ultram, flexeril, naproxen. Pt did not take the prescriptions. Pt requesting "something for pain" on arrival to the ED. Denies CP/SOB, no abd pain, no N/V/D, no focal motor weakness, no tingling/numbness in extremities, no calf pain or unilateral swelling.   Past Medical History:  Diagnosis Date  . Abnormal vaginal Pap smear   . Anxiety   . Bipolar disorder (HCC)   . Depression   . Migraine without aura   . Polycystic ovary syndrome   . Psychosis   . Schizophrenia (HCC)   . STD (sexually transmitted disease) 10-14-15   chlamydia     Patient Active Problem List   Diagnosis Date Noted  . PCOS (polycystic ovarian syndrome) 01/18/2016  . 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 Cannabis use disorder, moderate, dependence (HCC) 12/06/2015  . Schizophrenia spectrum disorder with psychotic disorder type not yet determined 12/05/2015  . Tobacco use disorder 12/05/2015  . Migraine without aura     History reviewed. No pertinent  surgical history.  OB History    Gravida Para Term Preterm AB Living   0 0 0 0 0 0   SAB TAB Ectopic Multiple Live Births   0 0 0 0         Home Medications    Prior to Admission medications   Medication Sig Start Date End Date Taking? Authorizing Provider  benzonatate (TESSALON) 100 MG capsule Take 1 capsule (100 mg total) by mouth every 8 (eight) hours. 03/24/16   Shawn C Joy, PA-C  cyclobenzaprine (FLEXERIL) 10 MG tablet Take 1 tablet (10 mg total) by mouth 3 (three) times daily as needed for muscle spasms. 04/26/16   Devoria AlbeIva Knapp, MD  ibuprofen (ADVIL,MOTRIN) 800 MG tablet Take 1 tablet (800 mg total) by mouth 3 (three) times daily. 03/24/16   Shawn C Joy, PA-C  lamoTRIgine (LAMICTAL) 25 MG tablet Take 1 tablet (25 mg total) by mouth at bedtime. Take 1 tab at night for 2 weeks, 2 tabs for 2 weeks, 4 tabs for 2 weeks, 8 tabs or 200 mg after. 12/12/15   Shari ProwsJolanta B Pucilowska, MD  metFORMIN (GLUCOPHAGE-XR) 500 MG 24 hr tablet Take 2 tablets (1,000 mg total) by mouth 2 (two) times daily with a meal. 01/18/16   Carlus Pavlovristina Gherghe, MD  naproxen (NAPROSYN) 500 MG tablet Take 1 po BID with food prn pain 04/26/16   Devoria AlbeIva Knapp, MD  norgestimate-ethinyl estradiol (ORTHO-CYCLEN, 28,) 0.25-35 MG-MCG tablet Take 1 tablet by mouth daily. 01/18/16  Carlus Pavlov, MD  OLANZapine (ZYPREXA) 20 MG tablet Take 1 tablet (20 mg total) by mouth at bedtime. 12/12/15   Shari Prows, MD  ondansetron (ZOFRAN ODT) 4 MG disintegrating tablet Take 1 tablet (4 mg total) by mouth every 8 (eight) hours as needed for nausea or vomiting. 03/24/16   Shawn C Joy, PA-C  traMADol (ULTRAM) 50 MG tablet Take 2 tablets (100 mg total) by mouth every 6 (six) hours as needed. 04/26/16   Devoria Albe, MD    Family History Family History  Problem Relation Age of Onset  . Hypertension Mother   . Heart disease Mother   . Cancer Mother   . Hypertension Other   . Depression Father   . Drug abuse Father   . Bipolar disorder Father     . Heart disease Maternal Grandmother   . Pancreatitis Maternal Grandfather   . Cancer Paternal Grandmother   . Cancer Paternal Grandfather     Social History Social History  Substance Use Topics  . Smoking status: Current Every Day Smoker    Packs/day: 0.50    Types: Cigarettes  . Smokeless tobacco: Never Used  . Alcohol use No     Allergies   Patient has no known allergies.   Review of Systems Review of Systems ROS: Statement: All systems negative except as marked or noted in the HPI; Constitutional: Negative for fever and chills. ; ; Eyes: Negative for eye pain, redness and discharge. ; ; ENMT: Negative for ear pain, hoarseness, nasal congestion, sinus pressure and sore throat. ; ; Cardiovascular: Negative for chest pain, palpitations, diaphoresis, dyspnea and +peripheral edema. ; ; Respiratory: Negative for cough, wheezing and stridor. ; ; Gastrointestinal: Negative for nausea, vomiting, diarrhea, abdominal pain, blood in stool, hematemesis, jaundice and rectal bleeding. . ; ; Genitourinary: Negative for dysuria, flank pain and hematuria. ; ; Musculoskeletal: +LBP. Negative for neck pain. Negative for deformity and trauma.; ; Skin: Negative for pruritus, rash, abrasions, blisters, bruising and skin lesion.; ; Neuro: Negative for headache, lightheadedness and neck stiffness. Negative for weakness, altered level of consciousness, altered mental status, extremity weakness, paresthesias, involuntary movement, seizure and syncope.       Physical Exam Updated Vital Signs BP 152/81 (BP Location: Left Arm)   Pulse 99   Temp 98.7 F (37.1 C) (Oral)   Resp 20   Ht 5' 3.5" (1.613 m)   Wt 180 lb (81.6 kg)   LMP 03/30/2016   SpO2 100%   BMI 31.39 kg/m   Physical Exam 1810; Physical examination:  Nursing notes reviewed; Vital signs and O2 SAT reviewed;  Constitutional: Well developed, Well nourished, Well hydrated, In no acute distress; Head:  Normocephalic, atraumatic; Eyes: EOMI,  PERRL, No scleral icterus; ENMT: Mouth and pharynx normal, Mucous membranes moist; Neck: Supple, Full range of motion, No lymphadenopathy; Cardiovascular: Regular rate and rhythm, No murmur, rub, or gallop; Respiratory: Breath sounds clear & equal bilaterally, No rales, rhonchi, wheezes.  Speaking full sentences with ease, Normal respiratory effort/excursion; Chest: Nontender, Movement normal; Abdomen: Soft, Nontender, Nondistended, Normal bowel sounds; Genitourinary: No CVA tenderness; Spine:  No midline CS, TS, LS tenderness. +TTP bilat lumbar paraspinal muscles. No rash.;; Extremities: Pulses normal, No tenderness, +tr pedal edema bilat. No calf tenderness, edema or asymmetry.; Neuro: AA&Ox3, Major CN grossly intact.  Speech clear. No gross focal motor or sensory deficits in extremities. Strength 5/5 equal bilat UE's and LE's, including great toe dorsiflexion.  DTR 2/4 equal bilat UE's and LE's.  No  gross sensory deficits.  Neg straight leg raises bilat.; Skin: Color normal, Warm, Dry.   ED Treatments / Results  Labs (all labs ordered are listed, but only abnormal results are displayed)   EKG  EKG Interpretation None       Radiology   Procedures Procedures (including critical care time)  Medications Ordered in ED Medications  acetaminophen (TYLENOL) tablet 1,000 mg (not administered)  methocarbamol (ROBAXIN) tablet 1,000 mg (not administered)     Initial Impression / Assessment and Plan / ED Course  I have reviewed the triage vital signs and the nursing notes.  Pertinent labs & imaging results that were available during my care of the patient were reviewed by me and considered in my medical decision making (see chart for details).  MDM Reviewed: nursing note, vitals and previous chart Reviewed previous: labs Interpretation: labs and CT scan   Results for orders placed or performed during the hospital encounter of 04/26/16  Urinalysis, Routine w reflex microscopic  Result  Value Ref Range   Color, Urine YELLOW YELLOW   APPearance CLEAR CLEAR   Specific Gravity, Urine 1.010 1.005 - 1.030   pH 7.0 5.0 - 8.0   Glucose, UA NEGATIVE NEGATIVE mg/dL   Hgb urine dipstick NEGATIVE NEGATIVE   Bilirubin Urine NEGATIVE NEGATIVE   Ketones, ur NEGATIVE NEGATIVE mg/dL   Protein, ur NEGATIVE NEGATIVE mg/dL   Nitrite NEGATIVE NEGATIVE   Leukocytes, UA NEGATIVE NEGATIVE  Pregnancy, urine  Result Value Ref Range   Preg Test, Ur NEGATIVE NEGATIVE   Ct Renal Stone Study Result Date: 04/26/2016 CLINICAL DATA:  Central lower back pain with nausea and vomiting EXAM: CT ABDOMEN AND PELVIS WITHOUT CONTRAST TECHNIQUE: Multidetector CT imaging of the abdomen and pelvis was performed following the standard protocol without IV contrast. COMPARISON:  None. FINDINGS: Lower chest: Lung bases demonstrate no acute infiltrate, consolidation or pleural effusion. Hepatobiliary: No focal liver abnormality is seen. Mild increased attenuation in the region of the gallbladder could relate to intraluminal sludge. No calcified stones. No wall thickening. No biliary dilatation. Pancreas: Unremarkable. No pancreatic ductal dilatation or surrounding inflammatory changes. Spleen: Normal in size without focal abnormality. Adrenals/Urinary Tract: Adrenal glands are unremarkable. Kidneys are normal, without renal calculi, focal lesion, or hydronephrosis. Bladder is unremarkable. Stomach/Bowel: Stomach is within normal limits. Appendix appears normal. No evidence of bowel wall thickening, distention, or inflammatory changes. Vascular/Lymphatic: Multiple sub cm mesenteric lymph nodes. No enlarged retroperitoneal nodes. Aorta is non aneurysmal. Reproductive: Uterus and bilateral adnexa are unremarkable. Other: No abdominal wall hernia or abnormality. No abdominopelvic ascites. Musculoskeletal: No acute or significant osseous findings. IMPRESSION: 1. No CT evidence for nephrolithiasis, hydronephrosis or ureteral calculus.  2. Possible sludge within the gallbladder. No wall thickening or biliary dilatation. 3. Multiple sub cm mesenteric lymph nodes, nonspecific but raises possibility of mesenteric adenitis. Electronically Signed   By: Jasmine PangKim  Fujinaga M.D.   On: 04/26/2016 19:37    2015:  BMP and d-dimer yesterday reassuring. Workup today also reassuring. Pt feels better after meds and wants to go home now. Continue to tx symptomatically at this time. Dx and testing d/w pt and family.  Questions answered.  Verb understanding, agreeable to d/c home with outpt f/u.     Final Clinical Impressions(s) / ED Diagnoses   Final diagnoses:  None    New Prescriptions New Prescriptions   No medications on file     Samuel JesterKathleen Breely Panik, DO 04/29/16 2037

## 2016-04-26 NOTE — ED Provider Notes (Signed)
[facility] Provider Note   CSN: [id number] Arrival date & time: 04/26/16  0045  Time seen 02:05 AM   History   Chief Complaint Chief Complaint  Patient presents with  . Back Pain    HPI Theresa [name] is a 20 y.o. female.  HPI  patient states she started having lower back pain about 3 days ago. She denies any known injury and denies any prior back problems. She states the muscles in her lower back feel tight. She feels like she's being stabbed with knives and needles and she moves. She states walking and changing positions makes it worse. She does not have anything that makes it feel better. She denies any radiation of pain into her legs but states her left lateral thigh feels tingling. She denies any rectal or urinary incontinence. She denies any fever. She states she vomited once daily the past 2 days when she was having a lot of pain. She also notes today she started getting swelling of her legs. She denies chest pain or shortness of breath. She states she took ibuprofen 800 mg today 3 and also took 3 Aleve today. She denies chest pain or shortness of breath. She denies pain in her calves.   Patient states her grandmother died today and had a history of DVT and peripheral vascular disease.  PCP none GYN [facility]  Past Medical History:  Diagnosis Date  . Abnormal vaginal Pap smear   . Anxiety   . Depression   . Migraine without aura   . Polycystic ovary syndrome   . STD (sexually transmitted disease) 10-14-15   chlamydia     Patient Active Problem List   Diagnosis Date Noted  . PCOS (polycystic ovarian syndrome) 01/18/2016  . 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 Cannabis use disorder, moderate, dependence (HCC) 12/06/2015  . Schizophrenia spectrum disorder with psychotic disorder type not yet determined 12/05/2015  . Tobacco use disorder 12/05/2015  . Migraine without aura     No past  surgical history on file.  OB History    Gravida Para Term Preterm AB Living   0 0 0 0 0 0   SAB TAB Ectopic Multiple Live Births   0 0 0 0         Home Medications    Prior to Admission medications   Medication Sig Start Date End Date Taking? Authorizing Provider  ibuprofen (ADVIL,MOTRIN) 800 MG tablet Take 1 tablet (800 mg total) by mouth 3 (three) times daily. 03/24/16  Yes Shawn C Joy, PA-C  metFORMIN (GLUCOPHAGE-XR) 500 MG 24 hr tablet Take 2 tablets (1,000 mg total) by mouth 2 (two) times daily with a meal. 01/18/16  Yes Carlus Pavlov, MD  norgestimate-ethinyl estradiol (ORTHO-CYCLEN, 28,) 0.25-35 MG-MCG tablet Take 1 tablet by mouth daily. 01/18/16  Yes Carlus Pavlov, MD  benzonatate (TESSALON) 100 MG capsule Take 1 capsule (100 mg total) by mouth every 8 (eight) hours. 03/24/16   Shawn C Joy, PA-C  cyclobenzaprine (FLEXERIL) 10 MG tablet Take 1 tablet (10 mg total) by mouth 3 (three) times daily as needed for muscle spasms. 04/26/16   Devoria Albe, MD  lamoTRIgine (LAMICTAL) 25 MG tablet Take 1 tablet (25 mg total) by mouth at bedtime. Take 1 tab at night for 2 weeks, 2 tabs for 2 weeks, 4 tabs for 2 weeks, 8 tabs or 200 mg after. 12/12/15   Shari Prows, MD  naproxen (NAPROSYN) 500 MG  tablet Take 1 po BID with food prn pain 04/26/16   Devoria AlbeIva Donold Marotto, MD  OLANZapine (ZYPREXA) 20 MG tablet Take 1 tablet (20 mg total) by mouth at bedtime. 12/12/15   Shari ProwsJolanta B Pucilowska, MD  ondansetron (ZOFRAN ODT) 4 MG disintegrating tablet Take 1 tablet (4 mg total) by mouth every 8 (eight) hours as needed for nausea or vomiting. 03/24/16   Shawn C Joy, PA-C  traMADol (ULTRAM) 50 MG tablet Take 2 tablets (100 mg total) by mouth every 6 (six) hours as needed. 04/26/16   Devoria AlbeIva Elita Dame, MD    Family History Family History  Problem Relation Age of Onset  . Hypertension Mother   . Heart disease Mother   . Cancer Mother   . Hypertension Other   . Depression Father   . Drug abuse Father   . Bipolar  disorder Father   . Heart disease Maternal Grandmother   . Pancreatitis Maternal Grandfather   . Cancer Paternal Grandmother   . Cancer Paternal Grandfather     Social History Social History  Substance Use Topics  . Smoking status: Current Every Day Smoker    Packs/day: 0.50    Types: Cigarettes  . Smokeless tobacco: Never Used  . Alcohol use No  does in home care Lives with spouse    Allergies   Patient has no known allergies.   Review of Systems Review of Systems  All other systems reviewed and are negative.    Physical Exam Updated Vital Signs BP 132/76 (BP Location: Left Arm)   Pulse 86   Temp 98.4 F (36.9 C) (Oral)   Resp 18   Ht 5' 3.5" (1.613 m)   Wt 180 lb (81.6 kg)   LMP 03/30/2016   SpO2 100%   BMI 31.39 kg/m   Vital signs normal    Physical Exam  Constitutional: She is oriented to person, place, and time. She appears well-developed and well-nourished.  Non-toxic appearance. She does not appear ill. She appears distressed.  obese  HENT:  Head: Normocephalic and atraumatic.  Right Ear: External ear normal.  Left Ear: External ear normal.  Nose: Nose normal. No mucosal edema or rhinorrhea.  Mouth/Throat: Oropharynx is clear and moist and mucous membranes are normal. No dental abscesses or uvula swelling.  Eyes: Conjunctivae and EOM are normal. Pupils are equal, round, and reactive to light.  Neck: Normal range of motion and full passive range of motion without pain. Neck supple.  Cardiovascular: Normal rate, regular rhythm and normal heart sounds.  Exam reveals no gallop and no friction rub.   No murmur heard. Pulmonary/Chest: Effort normal and breath sounds normal. No respiratory distress. She has no wheezes. She has no rhonchi. She has no rales. She exhibits no tenderness and no crepitus.  Abdominal: Soft. Normal appearance and bowel sounds are normal. She exhibits no distension. There is no tenderness. There is no rebound and no guarding.    Musculoskeletal: Normal range of motion. She exhibits no edema or tenderness.       Back:  Diffusely tender in her midline lumbar spine and the paraspinous muscles of the lumbar spine. Her patellar reflexes are 2+ and equal. She has pain with straight leg raising. Patient's legs look swollen and she has pitting edema of her ankles. Her calves are nontender.  Neurological: She is alert and oriented to person, place, and time. She has normal strength. No cranial nerve deficit.  Skin: Skin is warm, dry and intact. No rash noted. No erythema. No  pallor.  Psychiatric: She has a normal mood and affect. Her speech is normal and behavior is normal. Her mood appears not anxious.  Nursing note and vitals reviewed.    ED Treatments / Results  Labs (all labs ordered are listed, but only abnormal results are displayed) Results for orders placed or performed during the hospital encounter of 04/26/16  Basic metabolic panel  Result Value Ref Range   Sodium 136 135 - 145 mmol/L   Potassium 3.7 3.5 - 5.1 mmol/L   Chloride 99 (L) 101 - 111 mmol/L   CO2 30 22 - 32 mmol/L   Glucose, Bld 100 (H) 65 - 99 mg/dL   BUN 13 6 - 20 mg/dL   Creatinine, Ser 1.610.76 0.44 - 1.00 mg/dL   Calcium 9.1 8.9 - 09.610.3 mg/dL   GFR calc non Af Amer >60 >60 mL/min   GFR calc Af Amer >60 >60 mL/min   Anion gap 7 5 - 15  D-dimer, quantitative  Result Value Ref Range   D-Dimer, Quant 0.39 0.00 - 0.50 ug/mL-FEU  CBC with Differential  Result Value Ref Range   WBC 9.4 4.0 - 10.5 K/uL   RBC 4.29 3.87 - 5.11 MIL/uL   Hemoglobin 13.1 12.0 - 15.0 g/dL   HCT 04.538.7 40.936.0 - 81.146.0 %   MCV 90.2 78.0 - 100.0 fL   MCH 30.5 26.0 - 34.0 pg   MCHC 33.9 30.0 - 36.0 g/dL   RDW 91.413.1 78.211.5 - 95.615.5 %   Platelets 282 150 - 400 K/uL   Neutrophils Relative % 55 %   Neutro Abs 5.2 1.7 - 7.7 K/uL   Lymphocytes Relative 37 %   Lymphs Abs 3.4 0.7 - 4.0 K/uL   Monocytes Relative 6 %   Monocytes Absolute 0.5 0.1 - 1.0 K/uL   Eosinophils Relative 2 %    Eosinophils Absolute 0.2 0.0 - 0.7 K/uL   Basophils Relative 0 %   Basophils Absolute 0.0 0.0 - 0.1 K/uL   Laboratory interpretation all normal     EKG  EKG Interpretation None       Radiology No results found.  Procedures Procedures (including critical care time)  Medications Ordered in ED Medications  furosemide (LASIX) injection 60 mg (not administered)  diazepam (VALIUM) injection 10 mg (10 mg Intramuscular Given 04/26/16 0235)     Initial Impression / Assessment and Plan / ED Course  I have reviewed the triage vital signs and the nursing notes.  Pertinent labs & imaging results that were available during my care of the patient were reviewed by me and considered in my medical decision making (see chart for details).  Clinical Course    Patient has artery had a large dose of anti-inflammatory drugs today. I'm concerned about her renal function. A bmet was done to look at her kidney function. Also because of the swelling of her legs in the history of DVT and her family a d-dimer was done as a screen. Patient was given Valium IM because chest lots of complaints of stiffness in her back.  Recheck at discharge she states her pain is better but not gone. We discussed her laboratory testing which is reassuring, she has no acute renal injury or evidence of a potential DVT with a normal d-dimer. We discussed the swelling is most likely from taking the combined anti-inflammatory drugs. She was given Lasix one time as a injection to help reduce the edema. She was advised to stop the Motrin and Aleve and she was  placed on naproxen and Flexeril and tramadol.  Final Clinical Impressions(s) / ED Diagnoses   Final diagnoses:  Acute midline low back pain without sciatica  Mild peripheral edema    New Prescriptions New Prescriptions   CYCLOBENZAPRINE (FLEXERIL) 10 MG TABLET    Take 1 tablet (10 mg total) by mouth 3 (three) times daily as needed for muscle spasms.   NAPROXEN  (NAPROSYN) 500 MG TABLET    Take 1 po BID with food prn pain   TRAMADOL (ULTRAM) 50 MG TABLET    Take 2 tablets (100 mg total) by mouth every 6 (six) hours as needed.    Plan discharge  Devoria Albe, MD, Concha Pyo, MD 04/26/16 [phone]

## 2016-04-26 NOTE — Discharge Instructions (Signed)
Use ice and heat on your lower back for comfort. Stop the Motrin and Aleve!! Take the medications as prescribed. Elevate your feet to help with the swelling. The fluid shot should help get rid of the fluid in your legs.  Recheck if not improving over the next week.

## 2016-06-27 ENCOUNTER — Encounter: Payer: Self-pay | Admitting: Obstetrics and Gynecology

## 2016-07-19 ENCOUNTER — Telehealth: Payer: Self-pay | Admitting: Internal Medicine

## 2016-07-19 ENCOUNTER — Ambulatory Visit: Payer: Self-pay | Admitting: Internal Medicine

## 2016-07-19 DIAGNOSIS — Z0289 Encounter for other administrative examinations: Secondary | ICD-10-CM

## 2016-07-19 NOTE — Telephone Encounter (Signed)
Patient no showed today's appt. Please advise on how to follow up. °A. No follow up necessary. °B. Follow up urgent. Contact patient immediately. °C. Follow up necessary. Contact patient and schedule visit in ___ days. °D. Follow up advised. Contact patient and schedule visit in ____weeks. ° °

## 2016-07-19 NOTE — Telephone Encounter (Signed)
6 mo 

## 2016-07-24 NOTE — Telephone Encounter (Signed)
Called patient twice no answer.pb

## 2016-10-03 ENCOUNTER — Emergency Department
Admission: EM | Admit: 2016-10-03 | Discharge: 2016-10-04 | Disposition: A | Discharge status: Other Acute Inpatient Hospital | Payer: Commercial Managed Care - HMO | Attending: Emergency Medicine | Admitting: Emergency Medicine

## 2016-10-03 ENCOUNTER — Encounter: Payer: Self-pay | Admitting: *Deleted

## 2016-10-03 DIAGNOSIS — Z5181 Encounter for therapeutic drug level monitoring: Secondary | ICD-10-CM | POA: Insufficient documentation

## 2016-10-03 DIAGNOSIS — F3160 Bipolar disorder, current episode mixed, unspecified: Secondary | ICD-10-CM

## 2016-10-03 DIAGNOSIS — F23 Brief psychotic disorder: Secondary | ICD-10-CM

## 2016-10-03 DIAGNOSIS — F1220 Cannabis dependence, uncomplicated: Secondary | ICD-10-CM | POA: Diagnosis present

## 2016-10-03 DIAGNOSIS — F061 Catatonic disorder due to known physiological condition: Secondary | ICD-10-CM

## 2016-10-03 DIAGNOSIS — F17210 Nicotine dependence, cigarettes, uncomplicated: Secondary | ICD-10-CM | POA: Insufficient documentation

## 2016-10-03 DIAGNOSIS — F1290 Cannabis use, unspecified, uncomplicated: Secondary | ICD-10-CM | POA: Insufficient documentation

## 2016-10-03 LAB — CBC
HCT: 40.6 % (ref 35.0–47.0)
Hemoglobin: 14.1 g/dL (ref 12.0–16.0)
MCH: 31 pg (ref 26.0–34.0)
MCHC: 34.8 g/dL (ref 32.0–36.0)
MCV: 89.2 fL (ref 80.0–100.0)
Platelets: 277 10*3/uL (ref 150–440)
RBC: 4.56 MIL/uL (ref 3.80–5.20)
RDW: 13.4 % (ref 11.5–14.5)
WBC: 6.6 10*3/uL (ref 3.6–11.0)

## 2016-10-03 LAB — COMPREHENSIVE METABOLIC PANEL
ALT: 33 U/L (ref 14–54)
AST: 50 U/L — ABNORMAL HIGH (ref 15–41)
Albumin: 4.8 g/dL (ref 3.5–5.0)
Alkaline Phosphatase: 87 U/L (ref 38–126)
Anion gap: 16 — ABNORMAL HIGH (ref 5–15)
BUN: 11 mg/dL (ref 6–20)
CO2: 21 mmol/L — ABNORMAL LOW (ref 22–32)
Calcium: 9.4 mg/dL (ref 8.9–10.3)
Chloride: 101 mmol/L (ref 101–111)
Creatinine, Ser: 0.82 mg/dL (ref 0.44–1.00)
GFR calc Af Amer: >60 mL/min (ref >60)
GFR calc non Af Amer: >60 mL/min (ref >60)
Glucose, Bld: 115 mg/dL — ABNORMAL HIGH (ref 65–99)
Potassium: 2.8 mmol/L — ABNORMAL LOW (ref 3.5–5.1)
Sodium: 138 mmol/L (ref 135–145)
Total Bilirubin: 1.5 mg/dL — ABNORMAL HIGH (ref 0.3–1.2)
Total Protein: 7.8 g/dL (ref 6.5–8.1)

## 2016-10-03 LAB — URINE DRUG SCREEN, QUALITATIVE (ARMC ONLY)
Amphetamines, Ur Screen: NOT DETECTED
Barbiturates, Ur Screen: NOT DETECTED
Benzodiazepine, Ur Scrn: NOT DETECTED
Cannabinoid 50 Ng, Ur ~~LOC~~: POSITIVE — AB
Cocaine Metabolite,Ur ~~LOC~~: NOT DETECTED
MDMA (Ecstasy)Ur Screen: NOT DETECTED
Methadone Scn, Ur: NOT DETECTED
Opiate, Ur Screen: NOT DETECTED
Phencyclidine (PCP) Ur S: NOT DETECTED
Tricyclic, Ur Screen: NOT DETECTED

## 2016-10-03 LAB — SALICYLATE LEVEL: Salicylate Lvl: <7 mg/dL (ref 2.8–30.0)

## 2016-10-03 LAB — ACETAMINOPHEN LEVEL: Acetaminophen (Tylenol), Serum: <10 ug/mL — ABNORMAL LOW (ref 10–30)

## 2016-10-03 LAB — ETHANOL: Alcohol, Ethyl (B): <5 mg/dL (ref <5)

## 2016-10-03 LAB — PREGNANCY, URINE: Preg Test, Ur: NEGATIVE

## 2016-10-03 MED ORDER — ZIPRASIDONE MESYLATE 20 MG IM SOLR
20.0000 mg | Freq: Once | INTRAMUSCULAR | Status: AC
  Administered 2016-10-03: 20 mg via INTRAMUSCULAR
  Filled 2016-10-03: qty 20

## 2016-10-03 MED ORDER — HALOPERIDOL LACTATE 5 MG/ML IJ SOLN
10.0000 mg | Freq: Once | INTRAMUSCULAR | Status: AC
  Administered 2016-10-03: 10 mg via INTRAMUSCULAR
  Filled 2016-10-03: qty 2

## 2016-10-03 MED ORDER — OLANZAPINE 10 MG PO TBDP
20.0000 mg | ORAL_TABLET | Freq: Every day | ORAL | Status: DC
  Administered 2016-10-03 – 2016-10-04 (×2): 20 mg via ORAL
  Filled 2016-10-03 (×3): qty 2
  Filled 2016-10-03: qty 4

## 2016-10-03 MED ORDER — LORAZEPAM 2 MG/ML IJ SOLN
2.0000 mg | Freq: Once | INTRAMUSCULAR | Status: AC
  Administered 2016-10-03: 2 mg via INTRAMUSCULAR
  Filled 2016-10-03: qty 1

## 2016-10-03 MED ORDER — DIPHENHYDRAMINE HCL 50 MG/ML IJ SOLN
50.0000 mg | Freq: Once | INTRAMUSCULAR | Status: AC
  Administered 2016-10-03: 50 mg via INTRAMUSCULAR
  Filled 2016-10-03: qty 1

## 2016-10-03 MED ORDER — POTASSIUM CHLORIDE CRYS ER 20 MEQ PO TBCR
40.0000 meq | EXTENDED_RELEASE_TABLET | Freq: Once | ORAL | Status: AC
  Administered 2016-10-03: 40 meq via ORAL
  Filled 2016-10-03: qty 2

## 2016-10-03 NOTE — ED Notes (Signed)
Patient yelling and screaming "I am leaving. Theresa OmanYall are all the devil" Patient physical with officers and trying to ambulate out
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
## 2016-10-03 NOTE — ED Notes (Signed)
Pt was uncooperative when blood draw was attempted. Unable to draw blood due to same.

## 2016-10-03 NOTE — ED Notes (Signed)
Pt. To BHU from ED ambulatory without difficulty, to room  BHU 5. Report from The PepsiChristine RN. Pt. Is alert and oriented, warm and dry in no distress. Pt. Denies SI, HI, and AVH. Pt. Calm and cooperative. Pt not sure why she is here. [facility] calm and [facility] Pt. Made aware of security cameras and Q15 minute rounds. Pt. Encouraged to let Nursing staff know of any concerns or needs.

## 2016-10-03 NOTE — ED Notes (Signed)
Pt ambulatory back to triage area voluntarily.

## 2016-10-03 NOTE — ED Notes (Signed)
Pt continues to stand in lobby with mother and will not agee to come back to room to be triaged at this time.

## 2016-10-03 NOTE — ED Notes (Signed)
Per Dr. Cyril LoosenKinner, give potassium with 2200 medication

## 2016-10-03 NOTE — ED Notes (Signed)
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
## 2016-10-03 NOTE — ED Notes (Signed)
Patient refused adamantly to let staff check her vital signs. No distress noted. Patient calm at time in room
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
## 2016-10-03 NOTE — ED Notes (Signed)
Physical restraints not yet used at this time. Patient laying in bed at the moment

## 2016-10-03 NOTE — ED Notes (Signed)
Patient up and ambulating to restroom at this time

## 2016-10-03 NOTE — ED Notes (Signed)
Meal tray placed in pt's room, pt sleeping

## 2016-10-03 NOTE — ED Notes (Signed)
   Writer was unable to complete a thorough face to face assessment due to AMS.

## 2016-10-03 NOTE — ED Notes (Signed)
Spoke with patients mother on the phone. She requested [facility] does not [facility]

## 2016-10-03 NOTE — ED Notes (Addendum)
Pt brought in by her mother. [employer] officer, Mabe had to go out to car to convince patient to come into ER. PT agrees to come in to be treated voluntarily. Pt up to STAT desk with blank stare. Pt gives name and birth date after offering to be treated. Mother at side. Pt standing in lobby at this time. [employer] at side. Pt here voluntarily.

## 2016-10-03 NOTE — ED Notes (Signed)
[facility] in [facility].

## 2016-10-03 NOTE — ED Notes (Signed)
Pt alert, cooperative and calm for med administration, pt agreeable to having her VS taken by tech.  Pt requesting to use phone, pt told phone time in the AM, pt verbalizes understanding.

## 2016-10-03 NOTE — ED Notes (Signed)
Pt seen consuming meal

## 2016-10-04 ENCOUNTER — Inpatient Hospital Stay
Admission: RE | Admit: 2016-10-04 | Discharge: 2016-10-09 | DRG: 885 | Disposition: A | Discharge status: Home / Self Care | Payer: No Typology Code available for payment source | Source: Intra-hospital | Attending: Psychiatry | Admitting: Psychiatry

## 2016-10-04 DIAGNOSIS — K5900 Constipation, unspecified: Secondary | ICD-10-CM | POA: Diagnosis present

## 2016-10-04 DIAGNOSIS — F17210 Nicotine dependence, cigarettes, uncomplicated: Secondary | ICD-10-CM | POA: Diagnosis present

## 2016-10-04 DIAGNOSIS — Z818 Family history of other mental and behavioral disorders: Secondary | ICD-10-CM | POA: Diagnosis not present

## 2016-10-04 DIAGNOSIS — F1220 Cannabis dependence, uncomplicated: Secondary | ICD-10-CM | POA: Diagnosis present

## 2016-10-04 DIAGNOSIS — Z9114 Patient's other noncompliance with medication regimen: Secondary | ICD-10-CM

## 2016-10-04 DIAGNOSIS — Z793 Long term (current) use of hormonal contraceptives: Secondary | ICD-10-CM

## 2016-10-04 DIAGNOSIS — F3164 Bipolar disorder, current episode mixed, severe, with psychotic features: Secondary | ICD-10-CM | POA: Diagnosis present

## 2016-10-04 DIAGNOSIS — E282 Polycystic ovarian syndrome: Secondary | ICD-10-CM | POA: Diagnosis present

## 2016-10-04 DIAGNOSIS — G4700 Insomnia, unspecified: Secondary | ICD-10-CM | POA: Diagnosis present

## 2016-10-04 DIAGNOSIS — F17200 Nicotine dependence, unspecified, uncomplicated: Secondary | ICD-10-CM | POA: Diagnosis present

## 2016-10-04 DIAGNOSIS — Z9119 Patient's noncompliance with other medical treatment and regimen: Secondary | ICD-10-CM | POA: Diagnosis not present

## 2016-10-04 MED ORDER — OLANZAPINE 10 MG PO TABS
ORAL_TABLET | ORAL | Status: AC
  Filled 2016-10-04: qty 1

## 2016-10-04 MED ORDER — MAGNESIUM HYDROXIDE 400 MG/5ML PO SUSP: 30.0000 mL | Freq: Every day | ORAL | Status: DC | PRN

## 2016-10-04 MED ORDER — OLANZAPINE 5 MG PO TBDP: 20.0000 mg | ORAL_TABLET | Freq: Every day | ORAL | Status: DC

## 2016-10-04 MED ORDER — DOCUSATE SODIUM 100 MG PO CAPS
100.0000 mg | ORAL_CAPSULE | Freq: Two times a day (BID) | ORAL | Status: DC
  Administered 2016-10-05 – 2016-10-07 (×2): 100 mg via ORAL
  Filled 2016-10-04 (×4): qty 1

## 2016-10-04 MED ORDER — NAPROXEN 500 MG PO TABS
500.0000 mg | ORAL_TABLET | Freq: Two times a day (BID) | ORAL | Status: DC | PRN
Start: 2016-10-04 — End: 2016-10-09
  Filled 2016-10-04: qty 1

## 2016-10-04 MED ORDER — METFORMIN HCL ER 500 MG PO TB24
1000.0000 mg | ORAL_TABLET | Freq: Two times a day (BID) | ORAL | Status: DC
  Administered 2016-10-05 – 2016-10-07 (×5): 1000 mg via ORAL
  Filled 2016-10-04 (×5): qty 2

## 2016-10-04 MED ORDER — ACETAMINOPHEN 325 MG PO TABS
650.0000 mg | ORAL_TABLET | Freq: Four times a day (QID) | ORAL | Status: DC | PRN
Start: 2016-10-04 — End: 2016-10-09
  Administered 2016-10-08: 650 mg via ORAL
  Filled 2016-10-04: qty 2

## 2016-10-04 MED ORDER — ALUM & MAG HYDROXIDE-SIMETH 200-200-20 MG/5ML PO SUSP
30.0000 mL | ORAL | Status: DC | PRN
  Administered 2016-10-07: 30 mL via ORAL
  Filled 2016-10-04: qty 30

## 2016-10-04 MED ORDER — DOCUSATE SODIUM 100 MG PO CAPS
100.0000 mg | ORAL_CAPSULE | Freq: Two times a day (BID) | ORAL | Status: DC
  Administered 2016-10-04 (×2): 100 mg via ORAL
  Filled 2016-10-04 (×2): qty 1
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
## 2016-10-04 NOTE — ED Notes (Signed)
Report given to Gigi.  Staff will call when they are ready for patient to be transferred.

## 2016-10-04 NOTE — ED Notes (Signed)
Spoke to patient's mother.  She is aware of transfer to BMU.

## 2016-10-04 NOTE — BH Assessment (Signed)
[facility] Dr. Toni Amendlapacs.  Attending Physician will be Dr. Ardyth HarpsHernandez.   [facility] ObetzPhyllis.  ER staff is aware of the admission (Amy B.,  Patient's Nurse & Abby, Patient Access).

## 2016-10-04 NOTE — ED Notes (Signed)
Patient has bed BMU #305.

## 2016-10-04 NOTE — ED Provider Notes (Signed)
-----------------------------------------   7:20 AM on 10/04/2016 -----------------------------------------   Blood pressure (!) 118/53, pulse (!) 53, temperature 98 F (36.7 C), temperature source Oral, resp. rate 16, SpO2 100 %.  The patient had no acute events since last update.  Calm and cooperative at this time.  Disposition is pending Psychiatry/Behavioral Medicine team recommendations.     Sharyn CreamerQuale, Jonerik Sliker, MD 10/04/16 [phone]

## 2016-10-04 NOTE — ED Notes (Signed)
Patient is currently visiting with her mother.
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
## 2016-10-04 NOTE — ED Notes (Signed)
PT IVC/ PENDING PLACEMENT  

## 2016-10-04 NOTE — ED Notes (Signed)

## 2016-10-04 NOTE — ED Notes (Signed)
Report was received from Vesta Mixeraroline B., RN; Pt. Verbalizes  complaints of having a/v hallucinations; distress of anxiety; currently denies having  S.I./Hi. Continue to monitor with 15 min. Monitoring.

## 2016-10-04 NOTE — ED Notes (Signed)
Patient is alert and oriented.  Patient denies any thoughts of self harm.  She is not in any distress or physical pain.  She denies HI and does not appear to be responding to internal stimuli.  Patient requesting to take a shower and make a phone call.  She is pleasant and her affect is bright.

## 2016-10-05 DIAGNOSIS — F3164 Bipolar disorder, current episode mixed, severe, with psychotic features: Principal | ICD-10-CM

## 2016-10-05 LAB — LIPID PANEL
Cholesterol: 103 mg/dL (ref 0–200)
HDL: 37 mg/dL — ABNORMAL LOW (ref >40)
LDL Cholesterol: 54 mg/dL (ref 0–99)
Total CHOL/HDL Ratio: 2.8 RATIO
Triglycerides: 62 mg/dL (ref <150)
VLDL: 12 mg/dL (ref 0–40)

## 2016-10-05 LAB — TSH: TSH: 1.958 u[IU]/mL (ref 0.350–4.500)

## 2016-10-05 MED ORDER — LORAZEPAM 2 MG PO TABS
2.0000 mg | ORAL_TABLET | Freq: Every day | ORAL | Status: DC
  Administered 2016-10-05 – 2016-10-08 (×4): 2 mg via ORAL
  Filled 2016-10-05 (×4): qty 1

## 2016-10-05 MED ORDER — ARIPIPRAZOLE 5 MG PO TABS
15.0000 mg | ORAL_TABLET | Freq: Every day | ORAL | Status: DC
  Administered 2016-10-05 – 2016-10-06 (×2): 15 mg via ORAL
  Filled 2016-10-05 (×2): qty 1

## 2016-10-05 NOTE — [facility] Group Notes (Signed)
[facility] Group Notes:  (Nursing/MHT/Case Management/Adjunct)  Date:  10/05/2016  Time:  11:12 PM  Type of Therapy:  Group Therapy  Participation Level:  Active  Participation Quality:  Appropriate  Affect:  Appropriate  Cognitive:  Alert  Insight:  Good  Engagement in Group:  Engaged  Modes of Intervention:  Activity  Summary of Progress/Problems:  Theresa [name] 10/05/2016, 11:12 PM

## 2016-10-05 NOTE — Plan of Care (Signed)
Problem: Coping: Goal: Ability to cope will improve Outcome: Progressing Patient asked appropriate questions about her medications and was given information.

## 2016-10-05 NOTE — [facility] Suicide Risk Assessment (Signed)
[facility]   Nursing information obtained from:    Demographic factors:    Current Mental Status:    Loss Factors:    Historical Factors:    Risk Reduction Factors:     Total Time spent with patient: 1 hour Principal Problem: Bipolar affective disorder, mixed, severe, with psychotic behavior (HCC) Diagnosis:   Patient Active Problem List   Diagnosis Date Noted  . Bipolar affective disorder, mixed, severe, with psychotic behavior (HCC) [F31.64] 10/04/2016  . PCOS (polycystic ovarian syndrome) [E28.2] 01/18/2016  . Cannabis use disorder, moderate, dependence (HCC) [F12.20] 12/06/2015  . Tobacco use disorder [F17.200] 12/05/2015   Subjective Data:   Continued Clinical Symptoms:  Alcohol Use Disorder Identification Test Final Score (AUDIT): 1 The "Alcohol Use Disorders Identification Test", Guidelines for Use in Primary Care, Second Edition.  World Science writerHealth Organization [facility](WHO). Score between 0-7:  no or low risk or alcohol related problems. Score between 8-15:  moderate risk of alcohol related problems. Score between 16-19:  high risk of alcohol related problems. Score 20 or above:  warrants further diagnostic evaluation for alcohol dependence and treatment.   CLINICAL FACTORS:   Severe Anxiety and/or Agitation Bipolar Disorder:   Mixed State Currently Psychotic Previous Psychiatric Diagnoses and Treatments    Psychiatric Specialty Exam: Physical Exam  ROS  Blood pressure 122/70, pulse (!) 59, temperature 98 F (36.7 C), temperature source Oral, resp. rate 20, height 5\' 3"  (1.6 m), weight 84.4 kg (186 lb), last menstrual period 09/25/2016, SpO2 100 %.Body mass index is 32.95 kg/m.                                                    Sleep:  Number of Hours: 6.45      COGNITIVE FEATURES THAT CONTRIBUTE TO RISK:  Loss of executive function    SUICIDE RISK:   Moderate:  Frequent suicidal ideation with limited intensity, and  duration, some specificity in terms of plans, no associated intent, good self-control, limited dysphoria/symptomatology, some risk factors present, and identifiable protective factors, including available and accessible social support.  PLAN OF CARE: admit  I certify that inpatient services furnished can reasonably be expected to improve the patient's condition.   Jimmy FootmanHernandez-Gonzalez,  Theresa Son, MD 10/05/2016, 1:44 PM

## 2016-10-05 NOTE — Progress Notes (Signed)
D: Patient stated slept good last night .Stated appetite is good and energy level  Is normal. Stated concentration is good . Stated on Depression scale 3 , hopeless  0 and anxiety 2 .( low 0-10 high) Denies suicidal  homicidal ideations  .  No auditory hallucinations  No pain concerns . Appropriate ADL'S. Interacting with peers and staff. Goal " Get Better"  visited by parents . Parent voice concerns  About being here  Stated they went through something similar  Where she stayed 2 months in hospital. A: Encourage patient participation with unit programming . Instruction  Given on  Medication , verbalize understanding. R: Voice no other concerns. Staff continue to monitor

## 2016-10-05 NOTE — Tx Team (Signed)
Initial Treatment Plan 10/05/2016 2:27 AM Theresa KinsAllison M [name] [id number]    PATIENT STRESSORS: Financial difficulties Marital or family conflict Medication change or noncompliance   PATIENT STRENGTHS: Ability for insight Capable of independent living General fund of knowledge Motivation for treatment/growth Supportive family/friends   PATIENT IDENTIFIED PROBLEMS: depression  bipolar  Family stress  "getting back on medications"               DISCHARGE CRITERIA:  Adequate post-discharge living arrangements Improved stabilization in mood, thinking, and/or behavior Verbal commitment to aftercare and medication compliance  PRELIMINARY DISCHARGE PLAN: Attend aftercare/continuing care group Outpatient therapy  PATIENT/FAMILY INVOLVEMENT: This treatment plan has been presented to and reviewed with the patient, Theresa Kinsllison M [name].  The patient and family have been given the opportunity to ask questions and make suggestions.  Delos HaringPhillips, Tarrah Furuta A, RN 10/05/2016, 2:27 AM

## 2016-10-05 NOTE — Plan of Care (Signed)
Problem: Coping: Goal: Ability to cope will improve Outcome: Progressing Working on coping skills   

## 2016-10-05 NOTE — [facility] Group Notes (Signed)
[facility] LCSW Group Therapy Note  Date/Time: 10/05/16, 1300  Type of Therapy/Topic:  Group Therapy:  Balance in Life  Participation Level:  minimal  Description of Group:    This group will address the concept of balance and how it feels and looks when one is unbalanced. Patients will be encouraged to process areas in their lives that are out of balance, and identify reasons for remaining unbalanced. Facilitators will guide patients utilizing problem- solving interventions to address and correct the stressor making their life unbalanced. Understanding and applying boundaries will be explored and addressed for obtaining  and maintaining a balanced life. Patients will be encouraged to explore ways to assertively make their unbalanced needs known to significant others in their lives, using other group members and facilitator for support and feedback.  Therapeutic Goals: 1. Patient will identify two or more emotions or situations they have that consume much of in their lives. 2. Patient will identify signs/triggers that life has become out of balance:  3. Patient will identify two ways to set boundaries in order to achieve balance in their lives:  4. Patient will demonstrate ability to communicate their needs through discussion and/or role plays  Summary of Patient Progress: Group was large.  Pt was attentive and made several comments but did not speak very much.          Therapeutic Modalities:   Cognitive Behavioral Therapy Solution-Focused Therapy Assertiveness Training  Theresa [name], [place]
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
## 2016-10-05 NOTE — [facility] Group Notes (Signed)
[facility] Group Notes:  (Nursing/MHT/Case Management/Adjunct)  Date:  10/05/2016  Time:  1:30 AM  Type of Therapy:  Psychoeducational Skills  Participation Level:  Did Not Attend  Daphine DeutscherJasmine R Jasilyn [name] 10/05/2016, 1:30 AM
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
## 2016-10-06 LAB — HEMOGLOBIN A1C
Hgb A1c MFr Bld: 5.5 % (ref 4.8–5.6)
Mean Plasma Glucose: 111 mg/dL

## 2016-10-06 MED ORDER — ARIPIPRAZOLE ER 400 MG IM SRER
400.0000 mg | INTRAMUSCULAR | Status: DC
  Administered 2016-10-07: 400 mg via INTRAMUSCULAR
  Filled 2016-10-06: qty 400

## 2016-10-06 MED ORDER — ARIPIPRAZOLE 10 MG PO TABS
20.0000 mg | ORAL_TABLET | Freq: Every day | ORAL | Status: DC
  Administered 2016-10-07: 20 mg via ORAL
  Filled 2016-10-06: qty 2

## 2016-10-06 NOTE — Progress Notes (Signed)
Patient was friendly on contact and spent most of the evening walking in the halls, talking on the phone and engaging with peers.  She frequently walked up to staff who were engaged with another patient and required redirection.  She was compliant with this.

## 2016-10-06 NOTE — Progress Notes (Signed)
Theresa [name] approached Clinical research associatewriter early in the shift and stated she was tired from "this long weekend".  She opted to nap before bedtime and did get up for medications.  She denies SI/HI/AVH and contracts for safety.

## 2016-10-06 NOTE — BHH Group Notes (Signed)
[facility] LCSW Group Therapy   10/06/2016 1 PM   Type of Therapy: Group Therapy   Participation Level: Active   Participation Quality: Attentive, Sharing and Supportive   Affect: Appropriate   Cognitive: Alert and Oriented   Insight: Developing/Improving and Engaged   Engagement in Therapy: Developing/Improving and Engaged   Modes of Intervention: Clarification, Confrontation, Discussion, Education, Exploration, Limit-setting, Orientation, Problem-solving, Rapport Building, Dance movement psychotherapisteality Testing, Socialization and Support   Summary of Progress/Problems: The topic for group today was support or lack of support and how this affects recovery. This group focused on both positive and negative aspects of support or lack therof and allowed  group members to process ways to cope with negative emotions by discussing their coping mechanisms for a lack of support. Group members were asked to reflect on a time when their reaction to a lack of support led to a negative outcome and explored how using coping mechanisms to regulate their emotions had benefited them. Group members were also asked to discuss a time when emotion regulation was utilized when they felt overwhelmed.      Hampton AbbotKadijah Lorrine Killilea, MSW, LCSW-A 10/06/2016, 3:02PM

## 2016-10-06 NOTE — Progress Notes (Signed)
D: Theresa [name] has been appropriate on the unit today. She denied SI, HI, and AVH today. On her self inventory, she reported good sleep, good appetite, normal energy level, and good concentration. She rated her levels of depression, hopelessness, and anxiety all zero out of ten. She said her goal was to see her mother for Mother's Day and to work on helping move her things to a new place. "I think the change of my medicine was what I needed," she wrote. She declined scheduled Colace.  A: Meds given as ordered. No PRNs requested or given. Q15 safety checks maintained. Support/encouragement offered.  R: Pt remains free from harm and continues with treatment. Will continue to monitor for needs/safety.

## 2016-10-06 NOTE — [facility] Suicide Risk Assessment (Signed)
[facility] INPATIENT:  Family/Significant Other Suicide Prevention Education  Suicide Prevention Education:  Education Completed; mother, Leanna SatoDebra Scarlette ph#: [phone] has been identified by the patient as the family member/significant other with whom the patient will be residing, and identified as the person(s) who will aid the patient in the event of a mental health crisis (suicidal ideations/suicide attempt).  With written consent from the patient, the family member/significant other has been provided the following suicide prevention education, prior to the and/or following the discharge of the patient.  The suicide prevention education provided includes the following:  Suicide risk factors  Suicide prevention and interventions  National Suicide Hotline telephone number  [facility] assessment telephone number  [facility] Emergency Assistance 911  [facility] and/or Residential Mobile Crisis Unit telephone number  Request made of family/significant other to:  Remove weapons (e.g., guns, rifles, knives), all items previously/currently identified as safety concern.    Remove drugs/medications (over-the-counter, prescriptions, illicit drugs), all items previously/currently identified as a safety concern.  The family member/significant other verbalizes understanding of the suicide prevention education information provided.  The family member/significant other agrees to remove the items of safety concern listed above.  Lynden OxfordKadijah R Oveta Idris, MSW, LCSW-A 10/06/2016, 10:27 AM
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
## 2016-10-06 NOTE — Plan of Care (Signed)
Problem: Coping: Goal: Ability to cope will improve Outcome: Progressing Theresa [name] engaged assertively when approached.

## 2016-10-07 MED ORDER — METFORMIN HCL ER 500 MG PO TB24: 500.0000 mg | ORAL_TABLET | Freq: Every day | ORAL | Status: DC

## 2016-10-07 MED ORDER — ARIPIPRAZOLE 10 MG PO TABS
30.0000 mg | ORAL_TABLET | Freq: Every day | ORAL | Status: DC
Start: 2016-10-08 — End: 2016-10-07

## 2016-10-07 MED ORDER — ARIPIPRAZOLE 10 MG PO TABS
30.0000 mg | ORAL_TABLET | Freq: Every day | ORAL | Status: DC
  Administered 2016-10-08: 30 mg via ORAL
  Filled 2016-10-07: qty 3

## 2016-10-07 NOTE — [facility] Group Notes (Signed)
[facility] LCSW Group Therapy Note  Date/Time: 10/07/16, 1300  Type of Therapy and Topic:  Group Therapy:  Overcoming Obstacles  Participation Level:  active  Description of Group:    In this group patients will be encouraged to explore what they see as obstacles to their own wellness and recovery. They will be guided to discuss their thoughts, feelings, and behaviors related to these obstacles. The group will process together ways to cope with barriers, with attention given to specific choices patients can make. Each patient will be challenged to identify changes they are motivated to make in order to overcome their obstacles. This group will be process-oriented, with patients participating in exploration of their own experiences as well as giving and receiving support and challenge from other group members.  Therapeutic Goals: 1. Patient will identify personal and current obstacles as they relate to admission. 2. Patient will identify barriers that currently interfere with their wellness or overcoming obstacles.  3. Patient will identify feelings, thought process and behaviors related to these barriers. 4. Patient will identify two changes they are willing to make to overcome these obstacles:    Summary of Patient Progress: Pt identified trauma, problems with her medications as obstacles that she have contributed to short and long term problems.  Pt participated in group discussion regarding ways to overcome these obstacles.      Therapeutic Modalities:   Cognitive Behavioral Therapy Solution Focused Therapy Motivational Interviewing Relapse Prevention Therapy  Daleen SquibbGreg Amiria Orrison, LCSW

## 2016-10-07 NOTE — [facility] Group Notes (Signed)
[facility] Group Notes:  (Nursing/MHT/Case Management/Adjunct)  Date:  10/07/2016  Time:  4:01 PM  Type of Therapy:  Psychoeducational Skills  Participation Level:  Active  Participation Quality:  Appropriate, Attentive and Supportive  Affect:  Appropriate  Cognitive:  Appropriate  Insight:  Appropriate  Engagement in Group:  Engaged and Supportive  Modes of Intervention:  Discussion, Education and Exploration  Summary of Progress/Problems:  Theresa [name] 10/07/2016, 4:01 PM

## 2016-10-07 NOTE — Progress Notes (Signed)
Patient pleasant and cooperative. Med and group compliant. Appropriate with staff and peers. Denies SI, HI, AVH. Pt reports not wanting abilify shot due to not being able to afford it after discharge.  Encouragement and support offered. Safety checks maintained. Medications given as prescribed.  Pt receptive and remains safe on unit with q 15 min checks.
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
## 2016-10-07 NOTE — Tx Team (Signed)
Interdisciplinary Treatment and Diagnostic Plan Update  10/07/2016 Time of Session: 1120 Theresa [name] MRN: [id number]  Principal Diagnosis: Bipolar affective disorder, mixed, severe, with psychotic behavior (HCC)  Secondary Diagnoses: Principal Problem:   Bipolar affective disorder, mixed, severe, with psychotic behavior (HCC) Active Problems:   Tobacco use disorder   Cannabis use disorder, moderate, dependence (HCC)   PCOS (polycystic ovarian syndrome)   Current Medications:  Current Facility-Administered Medications  Medication Dose Route Frequency Provider Last Rate Last Dose  . acetaminophen (TYLENOL) tablet 650 mg  650 mg Oral Q6H PRN Clapacs, John T, MD      . alum & mag hydroxide-simeth (MAALOX/MYLANTA) 200-200-20 MG/5ML suspension 30 mL  30 mL Oral Q4H PRN Clapacs, Jackquline Denmark, MD   30 mL at 10/07/16 0848  . [START ON 10/08/2016] ARIPiprazole (ABILIFY) tablet 30 mg  30 mg Oral QHS Jimmy Footman, MD      . ARIPiprazole ER SRER 400 mg  400 mg Intramuscular Q30 days Jimmy Footman, MD   400 mg at 10/07/16 0835  . LORazepam (ATIVAN) tablet 2 mg  2 mg Oral QHS Jimmy Footman, MD   2 mg at 10/06/16 2217  . magnesium hydroxide (MILK OF MAGNESIA) suspension 30 mL  30 mL Oral Daily PRN Clapacs, John T, MD      . naproxen (NAPROSYN) tablet 500 mg  500 mg Oral BID PRN Clapacs, Jackquline Denmark, MD       PTA Medications: Prescriptions Prior to Admission  Medication Sig Dispense Refill Last Dose  . norgestimate-ethinyl estradiol (ORTHO-CYCLEN, 28,) 0.25-35 MG-MCG tablet Take 1 tablet by mouth daily. 3 Package 3 Past Month at Unknown time  . benzonatate (TESSALON) 100 MG capsule Take 1 capsule (100 mg total) by mouth every 8 (eight) hours. (Patient not taking: Reported on 10/04/2016) 21 capsule 0 Not Taking at Unknown time  . cyclobenzaprine (FLEXERIL) 10 MG tablet Take 1 tablet (10 mg total) by mouth 3 (three) times daily as needed for muscle spasms. (Patient not  taking: Reported on 10/03/2016) 30 tablet 0 Not Taking at Unknown time  . ibuprofen (ADVIL,MOTRIN) 800 MG tablet Take 1 tablet (800 mg total) by mouth 3 (three) times daily. (Patient not taking: Reported on 10/04/2016) 21 tablet 0 Not Taking at Unknown time  . lamoTRIgine (LAMICTAL) 25 MG tablet Take 1 tablet (25 mg total) by mouth at bedtime. Take 1 tab at night for 2 weeks, 2 tabs for 2 weeks, 4 tabs for 2 weeks, 8 tabs or 200 mg after. (Patient not taking: Reported on 10/04/2016) 45 tablet 0 Not Taking at Unknown time  . metFORMIN (GLUCOPHAGE-XR) 500 MG 24 hr tablet Take 2 tablets (1,000 mg total) by mouth 2 (two) times daily with a meal. (Patient not taking: Reported on 10/04/2016) 120 tablet 5 Not Taking at Unknown time  . naproxen (NAPROSYN) 500 MG tablet Take 1 po BID with food prn pain (Patient not taking: Reported on 10/04/2016) 30 tablet 0 Not Taking at Unknown time  . OLANZapine (ZYPREXA) 20 MG tablet Take 1 tablet (20 mg total) by mouth at bedtime. (Patient not taking: Reported on 10/04/2016) 30 tablet 1 Not Taking at Unknown time  . ondansetron (ZOFRAN ODT) 4 MG disintegrating tablet Take 1 tablet (4 mg total) by mouth every 8 (eight) hours as needed for nausea or vomiting. (Patient not taking: Reported on 10/04/2016) 20 tablet 0 Not Taking at Unknown time  . traMADol (ULTRAM) 50 MG tablet Take 2 tablets (100 mg total) by mouth every 6 (  six) hours as needed. (Patient not taking: Reported on 10/04/2016) 16 tablet 0 Not Taking at Unknown time    Patient Stressors: Financial difficulties Marital or family conflict Medication change or noncompliance  Patient Strengths: Ability for insight Capable of independent living General fund of knowledge Motivation for treatment/growth Supportive family/friends  Treatment Modalities: Medication Management, Group therapy, Case management,  1 to 1 session with clinician, Psychoeducation, Recreational therapy.   Physician Treatment Plan for Primary  Diagnosis: Bipolar affective disorder, mixed, severe, with psychotic behavior (HCC) Long Term Goal(s): Improvement in symptoms so as ready for discharge Improvement in symptoms so as ready for discharge   Short Term Goals: Ability to identify changes in lifestyle to reduce recurrence of condition will improve Ability to demonstrate self-control will improve Compliance with prescribed medications will improve Ability to identify and develop effective coping behaviors will improve Ability to identify triggers associated with substance abuse/mental health issues will improve  Medication Management: Evaluate patient's response, side effects, and tolerance of medication regimen.  Therapeutic Interventions: 1 to 1 sessions, Unit Group sessions and Medication administration.  Evaluation of Outcomes: Progressing  Physician Treatment Plan for Secondary Diagnosis: Principal Problem:   Bipolar affective disorder, mixed, severe, with psychotic behavior (HCC) Active Problems:   Tobacco use disorder   Cannabis use disorder, moderate, dependence (HCC)   PCOS (polycystic ovarian syndrome)  Long Term Goal(s): Improvement in symptoms so as ready for discharge Improvement in symptoms so as ready for discharge   Short Term Goals: Ability to identify changes in lifestyle to reduce recurrence of condition will improve Ability to demonstrate self-control will improve Compliance with prescribed medications will improve Ability to identify and develop effective coping behaviors will improve Ability to identify triggers associated with substance abuse/mental health issues will improve     Medication Management: Evaluate patient's response, side effects, and tolerance of medication regimen.  Therapeutic Interventions: 1 to 1 sessions, Unit Group sessions and Medication administration.  Evaluation of Outcomes: Progressing   RN Treatment Plan for Primary Diagnosis: Bipolar affective disorder, mixed,  severe, with psychotic behavior (HCC) Long Term Goal(s): Knowledge of disease and therapeutic regimen to maintain health will improve  Short Term Goals: Ability to remain free from injury will improve, Ability to verbalize frustration and anger appropriately will improve, Ability to demonstrate self-control, Ability to disclose and discuss suicidal ideas, Ability to identify and develop effective coping behaviors will improve and Compliance with prescribed medications will improve  Medication Management: RN will administer medications as ordered by provider, will assess and evaluate patient's response and provide education to patient for prescribed medication. RN will report any adverse and/or side effects to prescribing provider.  Therapeutic Interventions: 1 on 1 counseling sessions, Psychoeducation, Medication administration, Evaluate responses to treatment, Monitor vital signs and CBGs as ordered, Perform/monitor CIWA, COWS, AIMS and Fall Risk screenings as ordered, Perform wound care treatments as ordered.  Evaluation of Outcomes: Progressing   LCSW Treatment Plan for Primary Diagnosis: Bipolar affective disorder, mixed, severe, with psychotic behavior (HCC) Long Term Goal(s): Safe transition to appropriate next level of care at discharge, Engage patient in therapeutic group addressing interpersonal concerns.  Short Term Goals: Engage patient in aftercare planning with referrals and resources and Increase skills for wellness and recovery  Therapeutic Interventions: Assess for all discharge needs, 1 to 1 time with Social worker, Explore available resources and support systems, Assess for adequacy in community support network, Educate family and significant other(s) on suicide prevention, Complete Psychosocial Assessment, Interpersonal group therapy.  Evaluation  of Outcomes: Progressing   Progress in Treatment: Attending groups: Yes. Participating in groups: Yes. Taking medication as  prescribed: Yes. Toleration medication: Yes. Family/Significant other contact made: Yes, individual(s) contacted:  mother Patient understands diagnosis: Yes. Discussing patient identified problems/goals with staff: Yes. Medical problems stabilized or resolved: Yes. Denies suicidal/homicidal ideation: Yes. Issues/concerns per patient self-inventory: No. Other: none  New problem(s) identified: No, Describe:  none  New Short Term/Long Term Goal(s):Pt goal: To get regulated on my medicine.  Discharge Plan or Barriers: [name] assessing for appropriate plan.  Reason for Continuation of Hospitalization: Medication stabilization  Estimated Length of Stay: 2-3 days.  Attendees: Patient: Theresa [name] 10/07/2016   Physician: Dr. Ardyth Harps, MD 10/07/2016   Nursing: Leonia Reader, RN 10/07/2016   RN Care Manager: Maurice March, RN 10/07/2016   Social Worker: Daleen Squibb, LCSW 10/07/2016   Recreational Therapist:  10/07/2016   Other:  10/07/2016   Other:  10/07/2016   Other: 10/07/2016        Scribe for Treatment Team: Lorri Frederick, LCSW 10/07/2016 3:43 PM

## 2016-10-07 NOTE — Progress Notes (Signed)
[name] returned call to pt's mother who had questions and was also wanting to talk to Dr Rexene EdisonH.  [name] answered questions about aftercare options.  Dr Rexene EdisonH had agreed to call her as well. Garner NashGregory Ekaterini Capitano, MSW, LCSW Clinical Social Worker 10/07/2016 2:10 PM

## 2016-10-07 NOTE — Progress Notes (Signed)
Recreation Therapy Notes  INPATIENT RECREATION THERAPY ASSESSMENT  Patient Details Name: Theresa [name] MRN: [id number] DOB: 07-24-1995 Today's Date: 10/07/2016  Patient Stressors: Family, Death, Other (Comment) Relationship with family is getting better - family is similar and pt states they all need help, pt reports when her mom is away people look to her to help them; grandmother died in November; wants a doctor with compassion, worried about her insurance and being covered, in the process of moving  Coping Skills:   Isolate, Arguments, Substance Abuse, Exercise, Talking, Music  Personal Challenges: Communication, Concentration, Decision-Making, Expressing Yourself, Relationships, Social Interaction, Time Management, Trusting Others  Leisure Interests (2+):  Exercise - Walking, Music - Play instrument  Awareness of Community Resources:  Yes  Community Resources:  Church, Other (Comment) [place]  Current Use: Yes  If no, Barriers?:    Patient Strengths:  Kind-hearted, try to make others laugh  Patient Identified Areas of Improvement:  Being abke to express herself the way she needs to with certain things  Current Recreation Participation:  Trying to get close to her brother's baby  Patient Goal for Hospitalization:  To make sure the medicine with do right and not make her crazy  [place]  [place]idence[place]   Current [place] (including self-harm):  No  Current HI:  No  Consent to Intern Participation: N/A   [name],[name], LRT/CTRS 10/07/2016, 5:13 PM

## 2016-10-07 NOTE — Plan of Care (Signed)
Problem: Safety: Goal: Ability to remain free from injury will improve Outcome: Progressing No injury reported or observed   

## 2016-10-08 NOTE — Plan of Care (Signed)
Problem: Pain Managment: Goal: General experience of comfort will improve Outcome: Progressing Pt reports feeling better than prior days
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
## 2016-10-08 NOTE — [facility] Group Notes (Signed)
[facility] Group Notes:  (Nursing/MHT/Case Management/Adjunct)  Date:  10/08/2016  Time:  2:50 PM  Type of Therapy:  Psychoeducational Skills  Participation Level:  Active  Participation Quality:  Appropriate, Attentive and Supportive  Affect:  Appropriate  Cognitive:  Appropriate  Insight:  Improving  Engagement in Group:  Engaged  Modes of Intervention:  Discussion and Education  Summary of Progress/Problems:  Theresa [name] Theresa [name] 10/08/2016, 2:50 PM

## 2016-10-08 NOTE — [facility] Group Notes (Signed)
[facility] LCSW Group Therapy Note  Date/Time: 10/08/16, 1500  Type of Therapy/Topic:  Group Therapy:  Feelings about Diagnosis  Participation Level:  Minimal   Mood:pleasant   Description of Group:    This group will allow patients to explore their thoughts and feelings about diagnoses they have received. Patients will be guided to explore their level of understanding and acceptance of these diagnoses. Facilitator will encourage patients to process their thoughts and feelings about the reactions of others to their diagnosis, and will guide patients in identifying ways to discuss their diagnosis with significant others in their lives. This group will be process-oriented, with patients participating in exploration of their own experiences as well as giving and receiving support and challenge from other group members.   Therapeutic Goals: 1. Patient will demonstrate understanding of diagnosis as evidence by identifying two or more symptoms of the disorder:  2. Patient will be able to express two feelings regarding the diagnosis 3. Patient will demonstrate ability to communicate their needs through discussion and/or role plays  Summary of Patient Progress: Pt was attentive during group but did not speak up.  Pt declined to share her diagnosis when asked by [name].        Therapeutic Modalities:   Cognitive Behavioral Therapy Brief Therapy Feelings Identification   Daleen SquibbGreg Kirin Pastorino, LCSW

## 2016-10-08 NOTE — Progress Notes (Signed)
Recreation Therapy Notes  Date: 05.15.18 Time: 9:30 am Location: Craft Room  Group Topic: Self-expression  Goal Area(s) Addresses:  Patient will effectively use art as a means of self-expression. Patient will recognize positive benefit of self-expression. Patient will be able to identify one emotion experienced during group session. Patient will identify use of art as a coping skill.  Behavioral Response: Attentive  Intervention: Two Faces of Me  Activity: Patients were given blank face worksheets and were instructed to draw a line down the middle. On one side of the worksheet, patients were instructed to draw or write how they felt when they were admitted to the hospital. On the other side, patients were instructed to draw or write how they want to feel when they are d/c.  Education: LRT educated patients on other forms of self-expression.  Education Outcome: In group clarification offered  Clinical Observations/Feedback: Patient drew and wrote how she felt when she was admitted and how she wants to feel when she is d/c. Patient did not contribute to group discussion.  [name],Deairra Halleck M, LRT/CTRS 10/08/2016 10:14 AM

## 2016-10-08 NOTE — Progress Notes (Signed)
Patient pleasant and cooperative this am. Pt apologized for mother being inappropriate on last evening. Pt reports she has finally figured out why she was so nauseated. Reported she had a bowel movement and hot shower and felt much better. Pt looking towards discharge. Denies SI, HI, AVH. Complaints of pain, prn given with good relief. Encouragement and support offered. Safety checks maintained. Medications given as prescribed.  Pt receptive and remains safe on unit with q 15 min checks.

## 2016-10-08 NOTE — Progress Notes (Signed)
D: "I am worried about that shot.  I have been having back pain and I was nauseous."  Pt isolative and c/o back pain.  Declined needing medication for pain.  Feels her back pain could be from her menses coming. Denies s/i, h/i and hallucinations.  Contracts for safety on the unit.  Appeared to sleep most of the night.  A: Provided re-education about her medications.  Encouraged participation and proper sleep hygiene. Pt monitored on 15 minute safety checks.  Pt maintained on Low fall risk precautions.  R: Pt slept most of the night. Pt remains free from harm and continues with treatment plan.

## 2016-10-08 NOTE — Plan of Care (Signed)
Problem: Activity: Goal: Sleeping patterns will improve Outcome: Progressing Patient slept for Estimated Hours of 8; every 15 minutes safety round maintained, no injury or falls during this shift.    

## 2016-10-09 MED ORDER — ARIPIPRAZOLE 30 MG PO TABS: 30.0000 mg | ORAL_TABLET | Freq: Every day | ORAL | 0 refills | Status: DC

## 2016-10-09 MED ORDER — ARIPIPRAZOLE ER 400 MG IM SRER: 400.0000 mg | INTRAMUSCULAR | 0 refills | Status: DC

## 2016-10-09 MED ORDER — METFORMIN HCL ER 500 MG PO TB24: 1000.0000 mg | ORAL_TABLET | Freq: Every day | ORAL | 0 refills | Status: DC

## 2016-10-09 MED ORDER — LORAZEPAM 2 MG PO TABS: 2.0000 mg | ORAL_TABLET | Freq: Every evening | ORAL | 0 refills | Status: DC | PRN
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
## 2016-10-09 NOTE — Plan of Care (Signed)
Problem: Activity: Goal: Sleeping patterns will improve Outcome: Progressing Patient slept for Estimated Hours of 7.30; every 15 minutes safety round maintained, no injury or falls during this shift.    

## 2016-10-09 NOTE — [facility] Suicide Risk Assessment (Signed)
[facility]   Principal Problem: Bipolar affective disorder, mixed, severe, with psychotic behavior [place](HCC) Discharge Diagnoses:  Patient Active Problem List   Diagnosis Date Noted  . Bipolar affective disorder, mixed, severe, with psychotic behavior (HCC) [F31.64] 10/04/2016  . PCOS (polycystic ovarian syndrome) [E28.2] 01/18/2016  . Cannabis use disorder, moderate, dependence (HCC) [F12.20] 12/06/2015  . Tobacco use disorder [F17.200] 12/05/2015     Psychiatric Specialty Exam: ROS  Blood pressure (!) 103/55, pulse 76, temperature 97.9 F (36.6 C), temperature source Oral, resp. rate 18, height 5\' 3"  (1.6 m), weight 84.4 kg (186 lb), last menstrual period 09/25/2016, SpO2 100 %.Body mass index is 32.95 kg/m.                                                       Mental Status Per Nursing Assessment::   On Admission:     Demographic Factors:  Caucasian and Unemployed  Loss Factors: Decrease in vocational status and Financial problems/change in socioeconomic status  Historical Factors: Family history of mental illness or substance abuse  Risk Reduction Factors:   Sense of responsibility to family, Living with another person, especially a relative and Positive social support  Continued Clinical Symptoms:  Previous Psychiatric Diagnoses and Treatments  Cognitive Features That Contribute To Risk:  Closed-mindedness    Suicide Risk:  Minimal: No identifiable suicidal ideation.  Patients presenting with no risk factors but with morbid ruminations; may be classified as minimal risk based on the severity of the depressive symptoms  Follow-up Information    [facility]. Go on 10/11/2016.   Why:  Please attend your hospital discharge appointment on Friday, 10/11/16.  [facility].  Please bring a copy of your hospital discharge paperwork, photo ID, social security card, and proof of any  current income. Contact information: [address] [place] [id number] [phone]            Jimmy FootmanHernandez-Gonzalez,  Kahiau Schewe, MD 10/09/2016, 7:10 AM

## 2016-10-09 NOTE — Progress Notes (Signed)
Recreation Therapy Notes  Date: 05.16.18 Time: 9:30 am Location: Craft Room  Group Topic: Self-esteem  Goal Area(s) Addresses:  Patient will write at least one positive trait about self. Patient will verbalize benefit of having a healthy self-esteem.  Behavioral Response: Attentive, Interactive  Intervention: I Am  Activity: Patients were given a worksheet with the letter I on it and were instructed to write as many positive traits about themselves inside the letter.  Education: LRT educated patients on ways they can increase their self-esteem.  Education Outcome: Acknowledges education/In group clarification offered  Clinical Observations/Feedback: Patient wrote positive traits about self. Patient contributed to group discussion by stating what she can do to increase her self-esteem.  [name],Caili Escalera M, LRT/CTRS 10/09/2016 10:27 AM

## 2016-10-09 NOTE — Progress Notes (Signed)
D: Patient is aware of  Discharge this shift .Patient denies suicidal /homicidal ideations. Patient received all belongings brought in  A: No Storage medications. Writer reviewed Discharge Summary, Suicide Risk Assessment, and Transitional Record. Patient also received Prescriptions   from  MD. A 7 day supply of medications given to patient A: Writer instructed on discharge criteria  .  Marland Kitchen. Aware  Of follow up appointment . R: Patient left unit with no questions  Or concerns  With mother

## 2016-10-09 NOTE — Progress Notes (Signed)
  [facility] :  Will you be returning to the same living situation after discharge:  Yes,  with mother At discharge, do you have transportation home?: Yes,  mother Do you have the ability to pay for your medications: No. [place] resident.  Release of information consent forms completed and in the chart;  Patient's signature needed at discharge.  Patient to Follow up at: Follow-up Information    [facility]. Go on 10/11/2016.   Why:  Please attend your hospital discharge appointment on Friday, 10/11/16.  [facility].  Please bring a copy of your hospital discharge paperwork, photo ID, social security card, and proof of any current income. Contact information: [address] [place] [id number] [phone]           Next level of care provider has access to [facility] Link:no  Safety Planning and Suicide Prevention discussed: Yes,  with mother  Have you used any form of tobacco in the last 30 days? (Cigarettes, Smokeless Tobacco, Cigars, and/or Pipes): Yes  Has patient been referred to the Quitline?: Patient refused referral  Patient has been referred for addiction treatment: Yes  Lorri FrederickWierda, Mehul Rudin Jon, LCSW 10/09/2016, 10:31 AM

## 2016-10-09 NOTE — Plan of Care (Signed)
Problem: B[facility] Participation in Recreation Therapeutic Interventions Goal: STG-Other Recreation Therapy Goal (Specify) STG: Decision Making - Within 3 treatment sessions, patient will verbalize understanding of the decision making charts in one treatment session to increase healthy decision making skills.  Outcome: Completed/Met Date Met: 10/09/16 Treatment Session 1; Completed 1 out of 1: At approximately 8:40 am, LRT met with patient in consultation room. LRT educated and provided patient with decision making charts. Patient verbalized understanding. LRT encouraged patient to use the charts to help her make better decisions.   M , LRT/CTRS 05.16.18 2:09 pm  Problem: [facility] Participation in Recreation Therapeutic Interventions Goal: STG-Other Recreation Therapy Goal (Specify) STG: Time Management - Within 3 treatment sessions, patient will verbalize understanding of scheduling in one treatment sessions to increase time management skills.  Outcome: Completed/Met Date Met: 10/09/16 Treatment Session 1; Completed 1 out of 1: At approximately 8:40 am, LRT met with patient in consultation room. LRT educated and provided patient with schedules. Patient verbalized understanding of scheduling. LRT encouraged patient to use the schedules to help her manage her time better.   M , LRT/CTRS 05.16.18 2:11 pm   

## 2016-10-09 NOTE — Progress Notes (Signed)
Recreation Therapy Notes  INPATIENT RECREATION TR PLAN  Patient Details Name: Theresa [name] MRN: [id number] DOB: 1995-11-29 Today's Date: 10/09/2016  Rec Therapy Plan Is patient appropriate for Therapeutic Recreation?: Yes Treatment times per week: At least once a week TR Treatment/Interventions: 1:1 session, Group participation (Comment) (Appropriate participation in daily recreational therapy tx)  Discharge Criteria Pt will be discharged from therapy if:: Treatment goals are met, Discharged Treatment plan/goals/alternatives discussed and agreed upon by:: Patient/family  Discharge Summary Short term goals set: See Care Plan Short term goals met: Complete Progress toward goals comments: One-to-one attended Which groups?: Self-esteem, Other (Comment) (Self-expression) One-to-one attended: Time management, decision making Reason goals not met: N/A Therapeutic equipment acquired: None Reason patient discharged from therapy: Discharge from hospital Pt/family agrees with progress & goals achieved: Yes Date patient discharged from therapy: 10/09/16   Leonette Monarch, LRT/CTRS 10/09/2016, 2:35 PM

## 2016-10-09 NOTE — Progress Notes (Signed)
Patient ID: Theresa [name], female   DOB: May 10, 1996, 21 y.o.   MRN: [id number] Bright affect and mood, well groomed, maintains positive inner perspective, able to self-examined and self-reflected. "I don't have any hallucinations now, this happens only when I am sleep deprived; my brother [name] and his GF visited; I will be moving with my mom where I will have my own room..." Pleasant, hopeful, optimistic, allowed to vent; complied with bedtime medications.

## 2016-10-26 ENCOUNTER — Emergency Department: Payer: Self-pay

## 2016-10-26 ENCOUNTER — Encounter: Payer: Self-pay | Admitting: Emergency Medicine

## 2016-10-26 ENCOUNTER — Emergency Department
Admission: EM | Admit: 2016-10-26 | Discharge: 2016-10-26 | Disposition: A | Discharge status: Home / Self Care | Payer: Self-pay | Attending: Emergency Medicine | Admitting: Emergency Medicine

## 2016-10-26 DIAGNOSIS — N390 Urinary tract infection, site not specified: Secondary | ICD-10-CM | POA: Insufficient documentation

## 2016-10-26 DIAGNOSIS — Z79899 Other long term (current) drug therapy: Secondary | ICD-10-CM | POA: Insufficient documentation

## 2016-10-26 DIAGNOSIS — F17210 Nicotine dependence, cigarettes, uncomplicated: Secondary | ICD-10-CM | POA: Insufficient documentation

## 2016-10-26 DIAGNOSIS — Z7984 Long term (current) use of oral hypoglycemic drugs: Secondary | ICD-10-CM | POA: Insufficient documentation

## 2016-10-26 DIAGNOSIS — F209 Schizophrenia, unspecified: Secondary | ICD-10-CM | POA: Insufficient documentation

## 2016-10-26 LAB — CBC WITH DIFFERENTIAL/PLATELET
Basophils Absolute: 0.1 10*3/uL (ref 0–0.1)
Basophils Relative: 1 %
Eosinophils Absolute: 0.1 10*3/uL (ref 0–0.7)
Eosinophils Relative: 1 %
HCT: 41.1 % (ref 35.0–47.0)
Hemoglobin: 14.1 g/dL (ref 12.0–16.0)
Lymphocytes Relative: 33 %
Lymphs Abs: 4.3 10*3/uL — ABNORMAL HIGH (ref 1.0–3.6)
MCH: 30.9 pg (ref 26.0–34.0)
MCHC: 34.4 g/dL (ref 32.0–36.0)
MCV: 89.9 fL (ref 80.0–100.0)
Monocytes Absolute: 0.8 10*3/uL (ref 0.2–0.9)
Monocytes Relative: 6 %
Neutro Abs: 7.9 10*3/uL — ABNORMAL HIGH (ref 1.4–6.5)
Neutrophils Relative %: 59 %
Platelets: 340 10*3/uL (ref 150–440)
RBC: 4.57 MIL/uL (ref 3.80–5.20)
RDW: 13.4 % (ref 11.5–14.5)
WBC: 13.2 10*3/uL — ABNORMAL HIGH (ref 3.6–11.0)

## 2016-10-26 LAB — URINALYSIS, COMPLETE (UACMP) WITH MICROSCOPIC
Bilirubin Urine: NEGATIVE
Glucose, UA: NEGATIVE mg/dL
Ketones, ur: NEGATIVE mg/dL
Nitrite: NEGATIVE
Protein, ur: 30 mg/dL — AB
Specific Gravity, Urine: 1.015 (ref 1.005–1.030)
pH: 6 (ref 5.0–8.0)

## 2016-10-26 LAB — URINE DRUG SCREEN, QUALITATIVE (ARMC ONLY)
Amphetamines, Ur Screen: NOT DETECTED
Barbiturates, Ur Screen: NOT DETECTED
Benzodiazepine, Ur Scrn: POSITIVE — AB
Cannabinoid 50 Ng, Ur ~~LOC~~: POSITIVE — AB
Cocaine Metabolite,Ur ~~LOC~~: NOT DETECTED
MDMA (Ecstasy)Ur Screen: NOT DETECTED
Methadone Scn, Ur: NOT DETECTED
Opiate, Ur Screen: NOT DETECTED
Phencyclidine (PCP) Ur S: NOT DETECTED
Tricyclic, Ur Screen: NOT DETECTED

## 2016-10-26 LAB — COMPREHENSIVE METABOLIC PANEL
ALT: 15 U/L (ref 14–54)
AST: 18 U/L (ref 15–41)
Albumin: 4.8 g/dL (ref 3.5–5.0)
Alkaline Phosphatase: 86 U/L (ref 38–126)
Anion gap: 9 (ref 5–15)
BUN: 13 mg/dL (ref 6–20)
CO2: 29 mmol/L (ref 22–32)
Calcium: 9.5 mg/dL (ref 8.9–10.3)
Chloride: 102 mmol/L (ref 101–111)
Creatinine, Ser: 0.8 mg/dL (ref 0.44–1.00)
GFR calc Af Amer: >60 mL/min (ref >60)
GFR calc non Af Amer: >60 mL/min (ref >60)
Glucose, Bld: 102 mg/dL — ABNORMAL HIGH (ref 65–99)
Potassium: 3.5 mmol/L (ref 3.5–5.1)
Sodium: 140 mmol/L (ref 135–145)
Total Bilirubin: 0.7 mg/dL (ref 0.3–1.2)
Total Protein: 7.6 g/dL (ref 6.5–8.1)

## 2016-10-26 LAB — POCT PREGNANCY, URINE: Preg Test, Ur: NEGATIVE

## 2016-10-26 LAB — CK: Total CK: 152 U/L (ref 38–234)

## 2016-10-26 LAB — ETHANOL: Alcohol, Ethyl (B): <5 mg/dL (ref <5)

## 2016-10-26 LAB — ACETAMINOPHEN LEVEL: Acetaminophen (Tylenol), Serum: <10 ug/mL — ABNORMAL LOW (ref 10–30)

## 2016-10-26 LAB — SALICYLATE LEVEL: Salicylate Lvl: <7 mg/dL (ref 2.8–30.0)

## 2016-10-26 MED ORDER — LORAZEPAM 2 MG/ML IJ SOLN
1.0000 mg | Freq: Once | INTRAMUSCULAR | Status: AC
  Administered 2016-10-26: 1 mg via INTRAVENOUS
  Filled 2016-10-26: qty 1

## 2016-10-26 MED ORDER — CEPHALEXIN 500 MG PO CAPS: 500.0000 mg | ORAL_CAPSULE | Freq: Three times a day (TID) | ORAL | 0 refills | Status: AC

## 2016-10-26 MED ORDER — CEPHALEXIN 500 MG PO CAPS
500.0000 mg | ORAL_CAPSULE | Freq: Once | ORAL | Status: AC
  Administered 2016-10-26: 500 mg via ORAL
  Filled 2016-10-26: qty 1

## 2016-10-26 MED ORDER — SODIUM CHLORIDE 0.9 % IV BOLUS (SEPSIS)
1000.0000 mL | Freq: Once | INTRAVENOUS | Status: AC
  Administered 2016-10-26: 1000 mL via INTRAVENOUS

## 2016-10-26 MED ORDER — LORAZEPAM 2 MG PO TABS: 1.0000 mg | ORAL_TABLET | Freq: Two times a day (BID) | ORAL | 0 refills | Status: DC | PRN

## 2016-10-26 NOTE — ED Provider Notes (Addendum)
[facility] Emergency Department Provider Note  ____________________________________________   I have reviewed the triage vital signs and the nursing notes.   HISTORY  Chief Complaint Back Pain and Urinary Retention    HPI Theresa [name] is a 21 y.o. female who presents today complaining of multiple different things. Patient does have a history of schizophrenic disorder as well as bipolar disorder, she apparently was started on Abilify and taken off her Lamictal recently. Patient states that she got a shot of Abilify during her last admission here, and was taking Abilify medication at home. The patient states that since she's had it her whole body feels stiff and sore and trembling. This includes her back and her legs and such that her entire body. 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 She has not had any fevers or chills but she does have hesitancy when she urinates. She denies hallucinations or SI or HI. Mother is concerned that her body pain is secondary to Abilify.  Symptoms essentially started soon after she received her dose. She denies any abdominal pain, nausea vomiting, diarrhea, she denies vaginal discharge. She denies fever or chills, she has no headache. Nothing makes her generalized tremor and pain worse nothing makes it better. She does not have a headache. She states that these symptoms of diffuse tremor and pain seemed to come and go. She cannot think of anything that makes them better or worse.   Past Medical History:  Diagnosis Date  . Abnormal vaginal Pap smear   . Anxiety   . Bipolar disorder (HCC)   . Depression   . Migraine without aura   . Polycystic ovary syndrome   . Psychosis   . Schizophrenia (HCC)   . STD (sexually transmitted disease) 10-14-15   chlamydia     Patient Active Problem List   Diagnosis Date Noted  . Bipolar affective disorder, mixed, severe, with psychotic behavior (HCC)  10/04/2016  . PCOS (polycystic ovarian syndrome) 01/18/2016  . Cannabis use disorder, moderate, dependence (HCC) 12/06/2015  . Tobacco use disorder 12/05/2015    History reviewed. No pertinent surgical history.  Prior to Admission medications   Medication Sig Start Date End Date Taking? Authorizing Provider  LORazepam (ATIVAN) 2 MG tablet Take 1 tablet (2 mg total) by mouth at bedtime as needed for anxiety. 10/09/16  Yes Jimmy FootmanHernandez-Gonzalez, Andrea, MD  metFORMIN (GLUCOPHAGE-XR) 500 MG 24 hr tablet Take 2 tablets (1,000 mg total) by mouth daily with supper. 10/09/16  Yes Jimmy FootmanHernandez-Gonzalez, Andrea, MD    Allergies Patient has no known allergies.  Family History  Problem Relation Age of Onset  . Hypertension Mother   . Heart disease Mother   . Cancer Mother   . Hypertension Other   . Depression Father   . Drug abuse Father   . Bipolar disorder Father   . Heart disease Maternal Grandmother   . Pancreatitis Maternal Grandfather   . Cancer Paternal Grandmother   . Cancer Paternal Grandfather     Social History Social History  Substance Use Topics  . Smoking status: Current Every Day Smoker    Packs/day: 0.50    Types: Cigarettes  . Smokeless tobacco: Never Used  . Alcohol use 0.0 oz/week    Review of Systems Constitutional: No fever/chills Eyes: No visual changes. ENT: No sore throat. No stiff neck no neck pain Cardiovascular: Denies chest pain. Respiratory: Denies shortness of breath. Gastrointestinal:   no vomiting.  No diarrhea.  No constipation. Genitourinary:  See history of present illness Musculoskeletal: Negative lower extremity swelling Skin: Negative for rash. Neurological: Negative for severe headaches, focal weakness or numbness.   ____________________________________________   PHYSICAL EXAM:  VITAL SIGNS: ED Triage Vitals [10/26/16 2023]  Enc Vitals Group     BP (!) 161/95     Pulse Rate 96     Resp 18     Temp 98.2 F (36.8 C)     Temp Source  Oral     SpO2 99 %     Weight 190 lb (86.2 kg)     Height 5\' 3"  (1.6 m)     Head Circumference      Peak Flow      Pain Score 4     Pain Loc      Pain Edu?      Excl. in GC?     Constitutional: Alert and oriented. Patient has a diffuse mild tremor seems to come and go. Eyes: Conjunctivae are normal Head: Atraumatic HEENT: No congestion/rhinnorhea. Mucous membranes are moist.  Oropharynx non-erythematous Neck:   Nontender with no meningismus, no masses, no stridor Cardiovascular: Normal rate, regular rhythm. Grossly normal heart sounds.  Good peripheral circulation. Respiratory: Normal respiratory effort.  No retractions. Lungs CTAB. Abdominal: Soft and nontender. No distention. No guarding no rebound Back:  There is no focal tenderness or step off.  there is no midline tenderness there are no lesions noted. there is no CVA tenderness Musculoskeletal: No lower extremity tenderness, no upper extremity tenderness. No joint effusions, no DVT signs strong distal pulses no edema Neurologic:  Normal speech and language. No gross focal neurologic deficits are appreciated. Patient does not have a cogwheel rigidity but she has to be asked several times to relax her legs she limit very tensely, and there is some mild tremor noted bilaterally. Skin:  Skin is warm, dry and intact. No rash noted. Psychiatric: Mood and affect are somewhat flat. Speech and behavior are normal.  ____________________________________________   LABS (all labs ordered are listed, but only abnormal results are displayed)  Labs Reviewed  URINALYSIS, COMPLETE (UACMP) WITH MICROSCOPIC - Abnormal; Notable for the following:       Result Value   Color, Urine YELLOW (*)    APPearance TURBID (*)    Hgb urine dipstick SMALL (*)    Protein, ur 30 (*)    Leukocytes, UA LARGE (*)    Bacteria, UA MANY (*)    Squamous Epithelial / LPF TOO NUMEROUS TO COUNT (*)    All other components within normal limits  URINE CULTURE   COMPREHENSIVE METABOLIC PANEL  CBC WITH DIFFERENTIAL/PLATELET  URINE DRUG SCREEN, QUALITATIVE ([facility] ONLY)  CK  ETHANOL  ACETAMINOPHEN LEVEL  SALICYLATE LEVEL  POC URINE PREG, ED  POCT PREGNANCY, URINE   ____________________________________________  EKG  I personally interpreted any EKGs ordered by me or triage  ____________________________________________  RADIOLOGY  I reviewed any imaging ordered by me or triage that were performed during my shift and, if possible, patient and/or family made aware of any abnormal findings. ____________________________________________   PROCEDURES  Procedure(s) performed: None  Procedures  Critical Care performed: None  ____________________________________________   INITIAL IMPRESSION / ASSESSMENT AND PLAN / ED COURSE  Pertinent labs & imaging results that were available during my care of the patient were reviewed by me and considered in my medical decision making (see chart for details).  Patient has increased generalized tonicity possibly secondary to EPS associated with Abilify. She is also somewhat anxious.  She has not got any evidence of acute psychogenic decompensation fortunately. Family is concerned that she has been having these diffuse myalgias. We'll check a total CK we will give her IV fluid and we'll give her Ativan as it may help relax her, we will give her antibiotics for her urinary tract infection the last culture showed that everything but tetracycline would work for her UTI at that time. We will send a urine culture. We will send basic plan work. It is my hope that we can get the patient safely to outpatient follow-up. Should for not to be admitted. She does not meet criteria for IVC at this time.   ----------------------------------------- 11:23 PM on 10/26/2016 -----------------------------------------  After Ativan, patient has no limb tension or tremor and she feels much better. She has been on Ativan in the  past. Obviously I am reluctant to give her long-term Ativan that does seem to help her symptoms. Patient does not appear to be catatonic she has no SI no HI, there is no evidence of acute decompensated schizophrenia. The mother is concerned that this is not a good medication for her. It is difficult to tell these are extraparametal symptoms from her Abilify or just stress. In any event, she contracts for safety, mother feels she is safe for discharge. She has no SI or HI.  We are treating her urinary tract infection urine culture is pending white count is suitably elevated for urinary tract infection, no evidence of urosepsis or pyelonephritis, she is relaxed and feels much better. She would prefer not to be admitted. We did discuss admission for psychiatric evaluation with family and she would prefer to go home and she does not meet criteria to stay. Mother will keep her with her, they will follow up closely with Kathlynn Grate on Monday. Patient encouraged to obtain Medicaid which will help her obtain care as an outpatient. Return retractions and follow-up given and understood.     ____________________________________________   FINAL CLINICAL IMPRESSION(S) / ED DIAGNOSES  Final diagnoses:  None      This chart was dictated using voice recognition software.  Despite best efforts to proofread,  errors can occur which can change meaning.      Jeanmarie Plant, MD 10/26/16 2239    Jeanmarie Plant, MD 10/26/16 2326

## 2016-10-26 NOTE — ED Triage Notes (Addendum)
Pt discharged from behavior health on 10/09/16; since discharge pt c/o upper back pain that radiates down her spine; having difficulty voiding; denies dysuria; flat affect; has not seen a provider for symptoms she's been having-"I don't have a primary doctor"; mother with pt and doing most of the talking; says pt has not been feeling good since she was discharged; was given an IM injection of abilify before discharge and they are wondering if she's having a reaction to that; mother adds she thinks pt was doing better on lamictal; mother adds she wants her daughter put on birth control-understands that may not be addressed this visit to the ED;

## 2016-10-28 LAB — URINE CULTURE: Culture: >=100000 — AB

## 2017-01-29 ENCOUNTER — Other Ambulatory Visit: Payer: Self-pay | Admitting: Internal Medicine

## 2017-01-29 NOTE — Telephone Encounter (Signed)
Last o/v 01/18/16 1 no show and no future appointment has been scheduled refill or refuse please advise

## 2017-01-30 NOTE — Telephone Encounter (Signed)
OK to refill

## 2017-01-30 NOTE — Telephone Encounter (Signed)
Refill submitted. 

## 2017-05-12 ENCOUNTER — Encounter (HOSPITAL_COMMUNITY): Payer: Self-pay | Admitting: Emergency Medicine

## 2017-05-12 ENCOUNTER — Ambulatory Visit (HOSPITAL_COMMUNITY)
Admission: EM | Admit: 2017-05-12 | Discharge: 2017-05-12 | Disposition: A | Discharge status: Home / Self Care | Payer: BLUE CROSS/BLUE SHIELD | Attending: Urgent Care | Admitting: Urgent Care

## 2017-05-12 DIAGNOSIS — R059 Cough, unspecified: Secondary | ICD-10-CM

## 2017-05-12 DIAGNOSIS — R0789 Other chest pain: Secondary | ICD-10-CM

## 2017-05-12 DIAGNOSIS — Z720 Tobacco use: Secondary | ICD-10-CM

## 2017-05-12 DIAGNOSIS — R05 Cough: Secondary | ICD-10-CM | POA: Diagnosis not present

## 2017-05-12 DIAGNOSIS — J22 Unspecified acute lower respiratory infection: Secondary | ICD-10-CM

## 2017-05-12 DIAGNOSIS — R52 Pain, unspecified: Secondary | ICD-10-CM

## 2017-05-12 MED ORDER — PSEUDOEPHEDRINE HCL ER 120 MG PO TB12: 120.0000 mg | ORAL_TABLET | Freq: Two times a day (BID) | ORAL | 3 refills | Status: DC

## 2017-05-12 MED ORDER — BENZONATATE 100 MG PO CAPS: 100.0000 mg | ORAL_CAPSULE | Freq: Three times a day (TID) | ORAL | 0 refills | Status: DC | PRN

## 2017-05-12 MED ORDER — ALBUTEROL SULFATE HFA 108 (90 BASE) MCG/ACT IN AERS: 1.0000 | INHALATION_SPRAY | Freq: Four times a day (QID) | RESPIRATORY_TRACT | 0 refills | Status: DC | PRN

## 2017-05-12 NOTE — ED Triage Notes (Signed)
Pt here with cough and chills x several days

## 2017-05-12 NOTE — Discharge Instructions (Signed)
For sore throat try using a honey-based tea. Use 3 teaspoons of honey with juice squeezed from half lemon. Place shaved pieces of ginger into 1/2-1 cup of water and warm over stove top. Then mix the ingredients and repeat every 4 hours as needed. You may take 500mg Tylenol with ibuprofen 400-600mg every 6 hours for pain and inflammation.  °

## 2017-05-12 NOTE — ED Provider Notes (Signed)
  MRN: [id number] DOB: [birth year]  Subjective:   Theresa [name] is a 21 y.o. female presenting for 3 day history of productive hacking cough that elicits chest pain, shob. Has also had subjective fever, chills, body aches, nausea without vomiting. Denies sinus congestion, sinus pain, ear pain, ear drainage, abdominal pain, rashes. Has tried Tussin without relief. Also used Advil. Denies history of allergies, asthma. Smokes 1/2 ppd.   Theresa [name] has No Known Allergies.  Theresa [name]  has a past medical history of Abnormal vaginal Pap smear, Anxiety, Bipolar disorder (HCC), Depression, Migraine without aura, Polycystic ovary syndrome, Psychosis (HCC), Schizophrenia (HCC), and STD (sexually transmitted disease) (10-14-15). Also  has no past surgical history on file.  Objective:   Vitals: BP 135/68 (BP Location: Right Arm)   Pulse 97   Temp 98.6 F (37 C) (Oral)   Resp 18   SpO2 100%   Physical Exam  Constitutional: She is oriented to person, place, and time. She appears well-developed and well-nourished.  HENT:  TM's intact bilaterally, no effusions or erythema. Nasal turbinates pink and moist, nasal passages patent. Oropharynx with mild post-nasal drainage, mucous membranes moist.   Eyes: Right eye exhibits no discharge. Left eye exhibits no discharge.  Neck: Normal range of motion. Neck supple.  Cardiovascular: Normal rate, regular rhythm and intact distal pulses. Exam reveals no gallop and no friction rub.  No murmur heard. Pulmonary/Chest: No respiratory distress. She has wheezes (mild in mid-lower lung fields). She has no rales.  Lymphadenopathy:    She has no cervical adenopathy.  Neurological: She is alert and oriented to person, place, and time.  Skin: Skin is warm and dry.  Psychiatric: She has a normal mood and affect.    Assessment and Plan :   Cough  Lower respiratory infection  Body aches  Atypical chest pain  Tobacco use   Will manage supportively for viral type  illness. Schedule albuterol inhaler. Hydrate well and do not smoke while ill. Return-to-clinic precautions discussed, patient verbalized understanding.      Wallis BambergMani, Theresa [name], [place]PA-C 05/12/17 [id number]

## 2017-05-13 ENCOUNTER — Ambulatory Visit: Payer: Self-pay | Admitting: Physician Assistant

## 2017-09-19 ENCOUNTER — Encounter (HOSPITAL_COMMUNITY): Payer: Self-pay | Admitting: Emergency Medicine

## 2017-09-19 ENCOUNTER — Emergency Department (HOSPITAL_COMMUNITY)
Admission: EM | Admit: 2017-09-19 | Discharge: 2017-09-20 | Disposition: A | Discharge status: Home / Self Care | Payer: BLUE CROSS/BLUE SHIELD | Attending: Emergency Medicine | Admitting: Emergency Medicine

## 2017-09-19 ENCOUNTER — Other Ambulatory Visit: Payer: Self-pay

## 2017-09-19 DIAGNOSIS — F29 Unspecified psychosis not due to a substance or known physiological condition: Secondary | ICD-10-CM | POA: Insufficient documentation

## 2017-09-19 DIAGNOSIS — F17210 Nicotine dependence, cigarettes, uncomplicated: Secondary | ICD-10-CM | POA: Insufficient documentation

## 2017-09-19 DIAGNOSIS — F319 Bipolar disorder, unspecified: Secondary | ICD-10-CM

## 2017-09-19 DIAGNOSIS — F1220 Cannabis dependence, uncomplicated: Secondary | ICD-10-CM | POA: Diagnosis present

## 2017-09-19 DIAGNOSIS — F419 Anxiety disorder, unspecified: Secondary | ICD-10-CM | POA: Insufficient documentation

## 2017-09-19 DIAGNOSIS — F329 Major depressive disorder, single episode, unspecified: Secondary | ICD-10-CM | POA: Insufficient documentation

## 2017-09-19 DIAGNOSIS — F209 Schizophrenia, unspecified: Secondary | ICD-10-CM | POA: Insufficient documentation

## 2017-09-19 DIAGNOSIS — Z79899 Other long term (current) drug therapy: Secondary | ICD-10-CM | POA: Insufficient documentation

## 2017-09-19 LAB — BASIC METABOLIC PANEL
Anion gap: 18 — ABNORMAL HIGH (ref 5–15)
BUN: 16 mg/dL (ref 6–20)
CO2: 23 mmol/L (ref 22–32)
Calcium: 10.6 mg/dL — ABNORMAL HIGH (ref 8.9–10.3)
Chloride: 103 mmol/L (ref 101–111)
Creatinine, Ser: 0.99 mg/dL (ref 0.44–1.00)
GFR calc Af Amer: >60 mL/min (ref >60)
GFR calc non Af Amer: >60 mL/min (ref >60)
Glucose, Bld: 145 mg/dL — ABNORMAL HIGH (ref 65–99)
Potassium: 4 mmol/L (ref 3.5–5.1)
Sodium: 144 mmol/L (ref 135–145)

## 2017-09-19 LAB — CBC WITH DIFFERENTIAL/PLATELET
Basophils Absolute: 0 10*3/uL (ref 0.0–0.1)
Basophils Relative: 0 %
Eosinophils Absolute: 0 10*3/uL (ref 0.0–0.7)
Eosinophils Relative: 0 %
HCT: 45.3 % (ref 36.0–46.0)
Hemoglobin: 15.3 g/dL — ABNORMAL HIGH (ref 12.0–15.0)
Lymphocytes Relative: 28 %
Lymphs Abs: 4.9 10*3/uL — ABNORMAL HIGH (ref 0.7–4.0)
MCH: 31.3 pg (ref 26.0–34.0)
MCHC: 33.8 g/dL (ref 30.0–36.0)
MCV: 92.6 fL (ref 78.0–100.0)
Monocytes Absolute: 1.2 10*3/uL — ABNORMAL HIGH (ref 0.1–1.0)
Monocytes Relative: 7 %
Neutro Abs: 11.5 10*3/uL — ABNORMAL HIGH (ref 1.7–7.7)
Neutrophils Relative %: 65 %
Platelets: 346 10*3/uL (ref 150–400)
RBC: 4.89 MIL/uL (ref 3.87–5.11)
RDW: 12.2 % (ref 11.5–15.5)
WBC: 17.7 10*3/uL — ABNORMAL HIGH (ref 4.0–10.5)

## 2017-09-19 LAB — ETHANOL: Alcohol, Ethyl (B): <10 mg/dL (ref <10)

## 2017-09-19 NOTE — BH Assessment (Signed)
Attempted TTS consult, pt remains unresponsive.

## 2017-09-19 NOTE — ED Notes (Signed)
Patient brought in under IVC, COMBATIVE, DEPUTY AT BEDSIDE.

## 2017-09-19 NOTE — ED Notes (Addendum)
When asking patient questions pt was unresponsive but made eye contact with nurse. This nurse asked patient if she was in pain at this time, I also asked the patient if she needed to go to the bathroom. Pt was unresponsive verbally but kept eye contact. When leaving patient's room after getting vital signs pt laughed out loud, but never spoke any words. Patient has handcuffs on all four extremities. All four extremities are red. Pt currently laying down calm.

## 2017-09-19 NOTE — ED Notes (Signed)
Patient is awake and calm she is in 4 point restraints

## 2017-09-19 NOTE — ED Notes (Signed)
Pt will not allow O2 sat monitor to be placed on her finger

## 2017-09-19 NOTE — ED Provider Notes (Addendum)
[facility] EMERGENCY DEPARTMENT Provider Note   CSN: [id number] Arrival date & time: 09/19/17  1333     History   Chief Complaint Chief Complaint  Patient presents with  . V70.1    HPI Theresa [name] is a 22 y.o. female.  Level 5 caveat for psychosis.  Patient was involuntarily committed by her mother for evidence of psychosis.  Patient is also been taking methamphetamines and marijuana per the commitment papers.  Patient will not answer questions.  Past medical history includes schizophrenia, bipolar disorder, psychosis.  It is uncertain if she is taking her home medications.  Further history obtained from mother.  Patient had her first psychotic break approximately 2 years ago when she was diagnosed with "catatonic schizophrenia".  She has been prescribed Risperdal and Ativan recently, but is uncertain whether she is taking her medications.  Her mother thinks she has been using marijuana and methamphetamines.  With her first psychotic episode, she was hospitalized for approximately 60 days.  Mother thinks the best place for her is [facility].  She also states that Abilify does not agree with her daughter.     Past Medical History:  Diagnosis Date  . Abnormal vaginal Pap smear   . Anxiety   . Bipolar disorder (HCC)   . Depression   . Migraine without aura   . Polycystic ovary syndrome   . Psychosis (HCC)   . Schizophrenia (HCC)   . STD (sexually transmitted disease) 10-14-15   chlamydia     Patient Active Problem List   Diagnosis Date Noted  . Bipolar affective disorder, mixed, severe, with psychotic behavior (HCC) 10/04/2016  . PCOS (polycystic ovarian syndrome) 01/18/2016  . Cannabis use disorder, moderate, dependence (HCC) 12/06/2015  . Tobacco use disorder 12/05/2015    History reviewed. No pertinent surgical history.   OB History    Gravida  0   Para  0   Term  0   Preterm  0   AB  0   Living  0     SAB  0   TAB  0   Ectopic  0   Multiple  0   Live Births               Home Medications    Prior to Admission medications   Medication Sig Start Date End Date Taking? Authorizing Provider  albuterol (PROVENTIL HFA;VENTOLIN HFA) 108 (90 Base) MCG/ACT inhaler Inhale 1-2 puffs into the lungs every 6 (six) hours as needed for wheezing or shortness of breath. 05/12/17   Wallis Bamberg, PA-C  benzonatate (TESSALON) 100 MG capsule Take 1-2 capsules (100-200 mg total) by mouth 3 (three) times daily as needed for cough. 05/12/17   Wallis Bamberg, PA-C  LORazepam (ATIVAN) 2 MG tablet Take 0.5 tablets (1 mg total) by mouth 2 (two) times daily as needed for anxiety (anxiety). 10/26/16   Jeanmarie Plant, MD  metFORMIN (GLUCOPHAGE-XR) 500 MG 24 hr tablet Take 2 tablets (1,000 mg total) by mouth daily with supper. 10/09/16   Jimmy Footman, MD  pseudoephedrine (SUDAFED 12 HOUR) 120 MG 12 hr tablet Take 1 tablet (120 mg total) by mouth 2 (two) times daily. 05/12/17   Wallis Bamberg, PA-C  SPRINTEC 28 0.25-35 MG-MCG tablet TAKE 1 TABLET BY MOUTH DAILY. 01/30/17   Carlus Pavlov, MD    Family History Family History  Problem Relation Age of Onset  . Hypertension Mother   . Heart disease Mother   . Cancer Mother   .  Hypertension Other   . Depression Father   . Drug abuse Father   . Bipolar disorder Father   . Heart disease Maternal Grandmother   . Pancreatitis Maternal Grandfather   . Cancer Paternal Grandmother   . Cancer Paternal Grandfather     Social History Social History   Tobacco Use  . Smoking status: Current Every Day Smoker    Packs/day: 0.50    Types: Cigarettes  . Smokeless tobacco: Never Used  Substance Use Topics  . Alcohol use: Yes    Alcohol/week: 0.0 oz  . Drug use: Yes    Types: Marijuana    Comment: pt denies     Allergies   Patient has no known allergies.   Review of Systems Review of Systems  Unable to perform ROS: Psychiatric disorder     Physical Exam Updated Vital Signs BP  119/88 (BP Location: Left Arm)   Temp 99.6 F (37.6 C) (Oral)   Resp 18   Ht 5\' 3"  (1.6 m)   Wt 86.2 kg (190 lb)   LMP  (LMP Unknown)   BMI 33.66 kg/m   Physical Exam  Constitutional:  Nonverbal, nad  HENT:  Head: Normocephalic and atraumatic.  Eyes: Conjunctivae are normal.  Neck: Neck supple.  Cardiovascular: Normal rate and regular rhythm.  Pulmonary/Chest: Effort normal and breath sounds normal.  Abdominal: Soft. Bowel sounds are normal.  Musculoskeletal: Normal range of motion.  Neurological: She is alert.  Skin: Skin is warm and dry.  Psychiatric:  Flat affect  Nursing note and vitals reviewed.    ED Treatments / Results  Labs (all labs ordered are listed, but only abnormal results are displayed) Labs Reviewed  CBC WITH DIFFERENTIAL/PLATELET - Abnormal; Notable for the following components:      Result Value   WBC 17.7 (*)    Hemoglobin 15.3 (*)    Neutro Abs 11.5 (*)    Lymphs Abs 4.9 (*)    Monocytes Absolute 1.2 (*)    All other components within normal limits  BASIC METABOLIC PANEL  PREGNANCY, URINE  ETHANOL  RAPID URINE DRUG SCREEN, HOSP PERFORMED    EKG None  Radiology No results found.  Procedures Procedures (including critical care time)  Medications Ordered in ED Medications - No data to display   Initial Impression / Assessment and Plan / ED Course  I have reviewed the triage vital signs and the nursing notes.  Pertinent labs & imaging results that were available during my care of the patient were reviewed by me and considered in my medical decision making (see chart for details).     Patient presents with behavioral change consistent with psychosis.  She does not answer questions.  Will obtain behavioral health consult.  Likely admission.  Patient will still not talk.  We had to restrain her to get blood work.  Behavioral health will reevaluate in the morning.    CRITICAL CARE Performed by: Donnetta HutchingOOK,Janith Nielson Total critical care  time: 30 minutes Critical care time was exclusive of separately billable procedures and treating other patients. Critical care was necessary to treat or prevent imminent or life-threatening deterioration. Critical care was time spent personally by me on the following activities: development of treatment plan with patient and/or surrogate as well as nursing, discussions with consultants, evaluation of patient's response to treatment, examination of patient, obtaining history from patient or surrogate, ordering and performing treatments and interventions, ordering and review of laboratory studies, ordering and review of radiographic studies, pulse oximetry and re-evaluation  of patient's condition.  I spoke with mother Alric Quan at [phone]              Final Clinical Impressions(s) / ED Diagnoses   Final diagnoses:  Psychosis, unspecified psychosis type [facility])    ED Discharge Orders    None       Donnetta Hutching, MD 09/19/17 1610    Donnetta Hutching, MD 09/19/17 Ander Gaster    Donnetta Hutching, MD 09/19/17 Alvina Filbert    Donnetta Hutching, MD 09/19/17 2025

## 2017-09-19 NOTE — BH Assessment (Signed)
Pt refused to talk to TTS to complete assessment. TTS will attempt assessment again at a later time.

## 2017-09-19 NOTE — ED Notes (Signed)
Pt still not responding verbally. Call from [facility], will wait a couple more hours to see if patient is responsive to questions for TTS. Continuing to monitor patient.

## 2017-09-19 NOTE — ED Triage Notes (Signed)
Pt's mother took out IVC papers on her stating that she is using meth and marijuana. She will not talk at triage

## 2017-09-20 ENCOUNTER — Encounter (HOSPITAL_COMMUNITY): Payer: Self-pay | Admitting: Emergency Medicine

## 2017-09-20 ENCOUNTER — Emergency Department (HOSPITAL_COMMUNITY)
Admission: EM | Admit: 2017-09-20 | Discharge: 2017-09-23 | Disposition: A | Discharge status: Other Acute Inpatient Hospital | Payer: Self-pay | Attending: Emergency Medicine | Admitting: Emergency Medicine

## 2017-09-20 ENCOUNTER — Other Ambulatory Visit: Payer: Self-pay

## 2017-09-20 ENCOUNTER — Encounter (HOSPITAL_COMMUNITY): Payer: Self-pay | Admitting: Registered Nurse

## 2017-09-20 DIAGNOSIS — F209 Schizophrenia, unspecified: Secondary | ICD-10-CM | POA: Insufficient documentation

## 2017-09-20 DIAGNOSIS — F319 Bipolar disorder, unspecified: Secondary | ICD-10-CM

## 2017-09-20 DIAGNOSIS — F3131 Bipolar disorder, current episode depressed, mild: Secondary | ICD-10-CM

## 2017-09-20 DIAGNOSIS — Z813 Family history of other psychoactive substance abuse and dependence: Secondary | ICD-10-CM

## 2017-09-20 DIAGNOSIS — R410 Disorientation, unspecified: Secondary | ICD-10-CM | POA: Insufficient documentation

## 2017-09-20 DIAGNOSIS — Z046 Encounter for general psychiatric examination, requested by authority: Secondary | ICD-10-CM | POA: Insufficient documentation

## 2017-09-20 DIAGNOSIS — F1220 Cannabis dependence, uncomplicated: Secondary | ICD-10-CM

## 2017-09-20 DIAGNOSIS — F17210 Nicotine dependence, cigarettes, uncomplicated: Secondary | ICD-10-CM | POA: Insufficient documentation

## 2017-09-20 DIAGNOSIS — R462 Strange and inexplicable behavior: Secondary | ICD-10-CM

## 2017-09-20 DIAGNOSIS — Z818 Family history of other mental and behavioral disorders: Secondary | ICD-10-CM

## 2017-09-20 DIAGNOSIS — F1290 Cannabis use, unspecified, uncomplicated: Secondary | ICD-10-CM

## 2017-09-20 LAB — URINALYSIS, ROUTINE W REFLEX MICROSCOPIC
Bacteria, UA: NONE SEEN
Bilirubin Urine: NEGATIVE
Glucose, UA: NEGATIVE mg/dL
Ketones, ur: 20 mg/dL — AB
Leukocytes, UA: NEGATIVE
Nitrite: NEGATIVE
Protein, ur: 30 mg/dL — AB
Specific Gravity, Urine: 1.028 (ref 1.005–1.030)
pH: 6 (ref 5.0–8.0)

## 2017-09-20 LAB — RAPID URINE DRUG SCREEN, HOSP PERFORMED
Amphetamines: NOT DETECTED
Barbiturates: NOT DETECTED
Benzodiazepines: NOT DETECTED
Cocaine: NOT DETECTED
Opiates: NOT DETECTED
Tetrahydrocannabinol: POSITIVE — AB

## 2017-09-20 LAB — PREGNANCY, URINE: Preg Test, Ur: NEGATIVE

## 2017-09-20 MED ORDER — LORAZEPAM 2 MG/ML IJ SOLN
2.0000 mg | Freq: Once | INTRAMUSCULAR | Status: AC
  Administered 2017-09-20: 2 mg via INTRAMUSCULAR
  Filled 2017-09-20: qty 1

## 2017-09-20 MED ORDER — HALOPERIDOL LACTATE 5 MG/ML IJ SOLN
5.0000 mg | Freq: Once | INTRAMUSCULAR | Status: AC
  Administered 2017-09-20: 5 mg via INTRAMUSCULAR
  Filled 2017-09-20: qty 1

## 2017-09-20 MED ORDER — LORAZEPAM 1 MG PO TABS: 1.0000 mg | ORAL_TABLET | ORAL | Status: DC | PRN

## 2017-09-20 MED ORDER — HALOPERIDOL 5 MG PO TABS: 5.0000 mg | ORAL_TABLET | Freq: Every day | ORAL | Status: DC

## 2017-09-20 NOTE — ED Notes (Signed)
Pt combative and not following protocol of changing into scrubs. Medications given

## 2017-09-20 NOTE — BH Assessment (Signed)
Tele Assessment Note   Patient Name: Theresa [name] MRN: [id number] Referring Physician: Dr Lynelle Doctor [facility] TTS Department  Per EDP Report: Theresa [name] is an 22 y.o. female who was involuntarily committed by her mother for evidence of psychosis.  Patient is also been taking methamphetamines and marijuana per the commitment papers.  Patient will not answer questions.  Past medical history includes schizophrenia, bipolar disorder, psychosis.  It is uncertain if she is taking her home medications. Further history obtained from mother.  Patient had her first psychotic break approximately 2 years ago when she was diagnosed with "catatonic schizophrenia".  She has been prescribed Risperdal and Ativan recently, but is uncertain whether she is taking her medications.  Her mother thinks she has been using marijuana and methamphetamines.  With her first psychotic episode, she was hospitalized for approximately 60 days  Patient was seen by Assunta Found, NP.  Patient was alert and able to respond to questions appropriately when prompted and when she chose to speak.  Patient denied SI/HI.  She stated that she sometimes heard things and questioned what it was that she was hearing at times. Her hallucinations did not appearto be command in nature. Patient was laughing at times and crying on one occasion, but did not appear to be truly psychotic. She could selectively speak and answer question when she wanted to.  She was very guarded when asked questions about her drug use.  However, her UDS was positive for marijuana.  Since patient does not appear to be a danger to herself or others, she does not meet admission for inpatient psych treatment and can be discharged home to f/u with [facility] for counseling and medications.    Diagnosis: Brief Psychotic Disorder  Past Medical History:  Past Medical History:  Diagnosis Date  . Abnormal vaginal Pap  smear   . Anxiety   . Bipolar disorder (HCC)   . Depression   . Migraine without aura   . Polycystic ovary syndrome   . Psychosis (HCC)   . Schizophrenia (HCC)   . STD (sexually transmitted disease) 10-14-15   chlamydia     History reviewed. No pertinent surgical history.  Family History:  Family History  Problem Relation Age of Onset  . Hypertension Mother   . Heart disease Mother   . Cancer Mother   . Hypertension Other   . Depression Father   . Drug abuse Father   . Bipolar disorder Father   . Heart disease Maternal Grandmother   . Pancreatitis Maternal Grandfather   . Cancer Paternal Grandmother   . Cancer Paternal Grandfather     Social History:  reports that she has been smoking cigarettes.  She has been smoking about 0.50 packs per day. She has never used smokeless tobacco. She reports that she drinks alcohol. She reports that she has current or past drug history. Drug: Marijuana.  Additional Social History:  Alcohol / Drug Use Pain Medications: denies Prescriptions: denies Over the Counter: denies History of alcohol / drug use?: Yes Longest period of sobriety (when/how long): unknown if she ever had any clean time Substance #1 Name of Substance 1: unknown, patient will not answer, UDS positive for THC 1 - Age of First Use: unknown 1 - Amount (size/oz): unknown 1 - Frequency: unknown 1 - Duration: unknown 1 - Last Use / Amount: unknown  CIWA: CIWA-Ar BP: (!) 93/56 Pulse Rate: 67 COWS:    Allergies: No Known Allergies  Home Medications:  (Not in a hospital admission)  OB/GYN Status:  No LMP recorded (lmp unknown).  General Assessment Data Assessment unable to be completed: Yes Reason for not completing assessment: Pt is refusing to talk to anyone. TTS will attempt assessment again later.  [facility] of Assessment: [facility] T[facility] Assessment: In system Is this a Tele or Face-to-Face Assessment?: Tele Assessment Is this an Initial Assessment or a  Re-assessment for this encounter?: Initial Assessment Marital status: Single Maiden name: Melito Is patient pregnant?: No Pregnancy Status: No Living Arrangements: Parent Can pt return to current living arrangement?: Yes Admission Status: Involuntary Is patient capable of signing voluntary admission?: Yes Referral Source: Self/Family/Friend Insurance type: Biomedical scientist)  Medical Screening Exam [facility] Walk-in ONLY) Medical Exam completed: Yes  Crisis Care Plan Living Arrangements: Parent Legal Guardian: Other:(self) Name of Psychiatrist: Susa Day) Name of Therapist: Customer service manager)  Education Status Is patient currently in school?: No Is the patient employed, unemployed or receiving disability?: Unemployed  Risk to self with the past 6 months Suicidal Ideation: No Has patient been a risk to self within the past 6 months prior to admission? : No Suicidal Intent: No Has patient had any suicidal intent within the past 6 months prior to admission? : No Is patient at risk for suicide?: No Suicidal Plan?: No Has patient had any suicidal plan within the past 6 months prior to admission? : No Access to Means: No What has been your use of drugs/alcohol within the last 12 months?: (abusing THC) Previous Attempts/Gestures: (unable to assess) How many times?: (unable to assess) Other Self Harm Risks: (none reported) Triggers for Past Attempts: None known Intentional Self Injurious Behavior: None Family Suicide History: Unknown Recent stressful life event(s): Other (Comment)(patient will not identify) Persecutory voices/beliefs?: No Depression: Yes Depression Symptoms: Loss of interest in usual pleasures Substance abuse history and/or treatment for substance abuse?: No Suicide prevention information given to non-admitted patients: Not applicable  Risk to Others within the past 6 months Homicidal Ideation: No Does patient have any lifetime risk of violence toward  others beyond the six months prior to admission? : No Thoughts of Harm to Others: No Current Homicidal Intent: No Current Homicidal Plan: No Access to Homicidal Means: No Identified Victim: (none) History of harm to others?: No Assessment of Violence: None Noted Violent Behavior Description: (none) Does patient have access to weapons?: No Criminal Charges Pending?: No Does patient have a court date: No Is patient on probation?: No  Psychosis Hallucinations: None noted Delusions: None noted  Mental Status Report Appearance/Hygiene: Unremarkable Eye Contact: Fair Motor Activity: Unremarkable Speech: Other (Comment)(will not talk by choice) Level of Consciousness: Alert Mood: Depressed, Apathetic Affect: Blunted, Flat Anxiety Level: Moderate Thought Processes: Thought Blocking Judgement: Impaired Orientation: Person, Place, Time, Situation Obsessive Compulsive Thoughts/Behaviors: None  Cognitive Functioning Concentration: Decreased Memory: Recent Impaired, Remote Impaired Is patient IDD: No Is patient DD?: No Insight: Poor Impulse Control: Poor Appetite: Poor Have you had any weight changes? : No Change Sleep: Decreased Total Hours of Sleep: (unknown) Vegetative Symptoms: Unable to Assess  ADLScreening [facility] Assessment Services) Patient's cognitive ability adequate to safely complete daily activities?: Yes Patient able to express need for assistance with ADLs?: Yes Independently performs ADLs?: Yes (appropriate for developmental age)     Prior Outpatient Therapy Prior Outpatient Therapy: Yes Prior Therapy Dates: active client Prior Therapy Facilty/Provider(s): [facility] Reason for Treatment: bipolar Does patient have an ACCT team?: No Does patient have Intensive In-House Services?  : No Does  patient have [facility] services? : No Does patient have P4CC services?: No  ADL Screening (condition at time of admission) Patient's cognitive ability adequate to safely  complete daily activities?: Yes Is the patient deaf or have difficulty hearing?: No Does the patient have difficulty seeing, even when wearing glasses/contacts?: No Does the patient have difficulty concentrating, remembering, or making decisions?: No Patient able to express need for assistance with ADLs?: Yes Does the patient have difficulty dressing or bathing?: No Independently performs ADLs?: Yes (appropriate for developmental age) Does the patient have difficulty walking or climbing stairs?: No Weakness of Legs: None Weakness of Arms/Hands: None       Abuse/Neglect Assessment (Assessment to be complete while patient is alone) Abuse/Neglect Assessment Can Be Completed: Unable to assess, patient is non-responsive or altered mental status Values / Beliefs Cultural Requests During Hospitalization: None Spiritual Requests During Hospitalization: None Consults Spiritual Care Consult Needed: No Social Work Consult Needed: No Merchant navy officer (For Healthcare) Does Patient Have a Medical Advance Directive?: No Would patient like information on creating a medical advance directive?: No - Patient declined    Additional Information 1:1 In Past 12 Months?: No CIRT Risk: No Elopement Risk: No Does patient have medical clearance?: No     Disposition: Per Shuvon Rankin, NP, patient can be discharged home to follow-up with [facility], Informed Nurse Marcelino Duster of Disposition and she planned to talk to attending MD for order to discharge. Disposition Initial Assessment Completed for this Encounter: Yes Disposition of Patient: (discharge home/ follow up -[facility], per Assunta Found, NP) Patient refused recommended treatment: No  This service was provided via telemedicine using a 2-way, interactive audio and video technology.  Names of all persons participating in this telemedicine service and their role in this encounter. Name: Theresa [name] Role: Patient  Name: Dannielle Huh Peytyn Trine Role: TTS   Name:  Role:   Name:  Role:     Daphene Calamity 09/20/2017 11:28 AM

## 2017-09-20 NOTE — ED Notes (Signed)
Pt walking in hallway having to be redirected multiple times by nurse and [employer]. Pt now in bathroom changing into burgundy paper scrubs.

## 2017-09-20 NOTE — Discharge Instructions (Addendum)
Follow-up with the mental health providers as recommended

## 2017-09-20 NOTE — ED Notes (Signed)
Pt refused to talk to TTS.  

## 2017-09-20 NOTE — ED Notes (Signed)
Per sitter, pt crying and asking for something to drink.  Water given.  Pt drank 2 cups of water.    Pt stood up beside bed, pt cleaned self and changed into paper scrubs with encouragment from staff and law enforcement.  Bed sheets changed again.  Pt given pad and mesh panties.  Pt's clothing labeled and locked in locker.  Pt used bsc and gave urine sample.  Pt now laying down in bed.  Lights off and warm blankets on pt.

## 2017-09-20 NOTE — ED Provider Notes (Addendum)
Vital signs are stable.  Labs notable for leukocytosis.  May be related to stress demargination.  No clear signs of infection.  Urinalysis appears contaminated.  Not consistent with urinary tract infection.  Awaiting psychiatric assessment   Linwood Dibbles, MD 09/20/17 [phone]  Patient was assessed by behavioral health.  They feel she is stable for outpatient follow-up.  I discussed this plan with the patient and she is anxious to go home.  She is alert calm and cooperative   Linwood Dibbles, MD 09/20/17 1211

## 2017-09-20 NOTE — Consult Note (Signed)
Telepsych Consultation   Reason for Consult:  Psychosis Referring Physician:  Nat Christen, MD [facility] Location of Provider: [facility]  Patient Identification: Theresa [name] MRN:  [id number] Principal Diagnosis: Bipolar affective disorder [facility]) Diagnosis:   Patient Active Problem List   Diagnosis Date Noted  . Bipolar affective disorder ([place]) [F31.9] 09/20/2017  . Bipolar affective disorder, mixed, severe, with psychotic behavior ([place]) [F31.64] 10/04/2016  . PCOS (polycystic ovarian syndrome) [E28.2] 01/18/2016  . Cannabis use disorder, moderate, dependence ([place]) [F12.20] 12/06/2015  . Tobacco use disorder [F17.200] 12/05/2015    Total Time spent with patient: 45 minutes  Subjective:   Theresa [name] is a 22 y.o. female patient [facility] with complaints that patient responding to psychosis related to drug use.  Marland Kitchen  HPI:  Theresa [name], 22 y.o., female patient seen via telepsych by this provider; chart reviewed and consulted with Dr. Dwyane Dee on 09/20/17.  On evaluation Theresa [name] is sitting up in bed during this evaluation.  During the beginning of the evaluation patient would only shake her head yes or no to any questions that were asked patient shook her head no when asked if she was having thoughts of killing herself or killing anyone else; if she was hearing or seeing things that other people do not hear or see; or if she felt that someone had been watching her, try to harm her, trying to kill her.  Patient was also asked about her medications but she would not respond to whether she was currently taking her psychotropic medications and not.  Patient did state " would you think I get 2 shots one here in one here." (Pointing to her biceps bilaterally where she had been given injections).  Patient was asked if she knew of anyone who could pick her up she shook her head yes asked if it was anyone that we could call  but she stated that she did not know the number.  Asked patient if it was okay and we called her mother patient responded yes patient also stated that she lives with her mother.  During assessment patient is alert and oriented x4; calm/cooperative; with pleasant affect.  Patient seems selective to what she wants to answer whether it is by the shaking her head yes or no or out loud.  Patient does not appear to be responding to internal/external stimuli or delusional thoughts.  And patient denied with a shake of her head no suicidal/self-harm/homicidal ideations, psychosis, and paranoia.     Past Psychiatric History: Bipolar affect disorder mixed, sever with psychotic behavior, Cannabis use disorder  Risk to Self: Suicidal Ideation: No Suicidal Intent: No Is patient at risk for suicide?: No Suicidal Plan?: No Access to Means: No What has been your use of drugs/alcohol within the last 12 months?: (abusing THC) How many times?: (unable to assess) Other Self Harm Risks: (none reported) Triggers for Past Attempts: None known Intentional Self Injurious Behavior: None Risk to Others: Homicidal Ideation: No Thoughts of Harm to Others: No Current Homicidal Intent: No Current Homicidal Plan: No Access to Homicidal Means: No Identified Victim: (none) History of harm to others?: No Assessment of Violence: None Noted Violent Behavior Description: (none) Does patient have access to weapons?: No Criminal Charges Pending?: No Does patient have a court date: No Prior Inpatient Therapy:   Prior Outpatient Therapy: Prior Outpatient Therapy: Yes Prior Therapy Dates: active client Prior Therapy Facilty/Provider(s): [facility] Reason for Treatment:  bipolar Does patient have an ACCT team?: No Does patient have Intensive In-House Services?  : No Does patient have [facility] services? : No Does patient have P4CC services?: No  Past Medical History:  Past Medical History:  Diagnosis Date  . Abnormal vaginal  Pap smear   . Anxiety   . Bipolar disorder ([place])   . Depression   . Migraine without aura   . Polycystic ovary syndrome   . Psychosis ([place])   . Schizophrenia ([place])   . STD (sexually transmitted disease) 10-14-15   chlamydia    History reviewed. No pertinent surgical history. Family History:  Family History  Problem Relation Age of Onset  . Hypertension Mother   . Heart disease Mother   . Cancer Mother   . Hypertension Other   . Depression Father   . Drug abuse Father   . Bipolar disorder Father   . Heart disease Maternal Grandmother   . Pancreatitis Maternal Grandfather   . Cancer Paternal Grandmother   . Cancer Paternal Grandfather    Family Psychiatric  History: See above list Social History:  Social History   Substance and Sexual Activity  Alcohol Use Yes  . Alcohol/week: 0.0 oz     Social History   Substance and Sexual Activity  Drug Use Yes  . Types: Marijuana   Comment: pt denies    Social History   Socioeconomic History  . Marital status: Single    Spouse name: Not on file  . Number of children: Not on file  . Years of education: Not on file  . Highest education level: Not on file  Occupational History  . Not on file  Social Needs  . Financial resource strain: Not on file  . Food insecurity:    Worry: Not on file    Inability: Not on file  . Transportation needs:    Medical: Not on file    Non-medical: Not on file  Tobacco Use  . Smoking status: Current Every Day Smoker    Packs/day: 0.50    Types: Cigarettes  . Smokeless tobacco: Never Used  Substance and Sexual Activity  . Alcohol use: Yes    Alcohol/week: 0.0 oz  . Drug use: Yes    Types: Marijuana    Comment: pt denies  . Sexual activity: Yes    Partners: Male    Birth control/protection: None  Lifestyle  . Physical activity:    Days per week: Not on file    Minutes per session: Not on file  . Stress: Not on file  Relationships  . Social connections:    Talks on phone: Not on  file    Gets together: Not on file    Attends religious service: Not on file    Active member of club or organization: Not on file    Attends meetings of clubs or organizations: Not on file    Relationship status: Not on file  Other Topics Concern  . Not on file  Social History Narrative  . Not on file   Additional Social History:    Allergies:  No Known Allergies  Labs:  Results for orders placed or performed during the hospital encounter of 09/19/17 (from the past 48 hour(s))  CBC with Differential     Status: Abnormal   Collection Time: 09/19/17  7:06 PM  Result Value Ref Range   WBC 17.7 (H) 4.0 - 10.5 K/uL   RBC 4.89 3.87 - 5.11 MIL/uL   Hemoglobin 15.3 (H) 12.0 -  15.0 g/dL   HCT 45.3 36.0 - 46.0 %   MCV 92.6 78.0 - 100.0 fL   MCH 31.3 26.0 - 34.0 pg   MCHC 33.8 30.0 - 36.0 g/dL   RDW 12.2 11.5 - 15.5 %   Platelets 346 150 - 400 K/uL   Neutrophils Relative % 65 %   Neutro Abs 11.5 (H) 1.7 - 7.7 K/uL   Lymphocytes Relative 28 %   Lymphs Abs 4.9 (H) 0.7 - 4.0 K/uL   Monocytes Relative 7 %   Monocytes Absolute 1.2 (H) 0.1 - 1.0 K/uL   Eosinophils Relative 0 %   Eosinophils Absolute 0.0 0.0 - 0.7 K/uL   Basophils Relative 0 %   Basophils Absolute 0.0 0.0 - 0.1 K/uL    Comment: Performed at [facility], [address] [place], [place] [address]  Basic metabolic panel     Status: Abnormal   Collection Time: 09/19/17  7:06 PM  Result Value Ref Range   Sodium 144 135 - 145 mmol/L   Potassium 4.0 3.5 - 5.1 mmol/L   Chloride 103 101 - 111 mmol/L   CO2 23 22 - 32 mmol/L   Glucose, Bld 145 (H) 65 - 99 mg/dL   BUN 16 6 - 20 mg/dL   Creatinine, Ser 0.99 0.44 - 1.00 mg/dL   Calcium 10.6 (H) 8.9 - 10.3 mg/dL   GFR calc non Af Amer >60 >60 mL/min   GFR calc Af Amer >60 >60 mL/min    Comment: (NOTE) The eGFR has been calculated using the CKD EPI equation. This calculation has not been validated in all clinical situations. eGFR's persistently <60 mL/min signify possible  Chronic Kidney Disease.    Anion gap 18 (H) 5 - 15    Comment: Performed at [facility], [address], [place], [place] [address]  Ethanol     Status: None   Collection Time: 09/19/17  7:06 PM  Result Value Ref Range   Alcohol, Ethyl (B) <10 <10 mg/dL    Comment:        LOWEST DETECTABLE LIMIT FOR SERUM ALCOHOL IS 10 mg/dL FOR MEDICAL PURPOSES ONLY Performed at [facility], [address], [place], [place] [address]   Pregnancy, urine     Status: None   Collection Time: 09/20/17  5:00 AM  Result Value Ref Range   Preg Test, Ur NEGATIVE NEGATIVE    Comment:        THE SENSITIVITY OF THIS METHODOLOGY IS >20 mIU/mL. Performed at [facility], [address] [place], [place] [address]   Urine rapid drug screen (hosp performed)     Status: Abnormal   Collection Time: 09/20/17  5:00 AM  Result Value Ref Range   Opiates NONE DETECTED NONE DETECTED   Cocaine NONE DETECTED NONE DETECTED   Benzodiazepines NONE DETECTED NONE DETECTED   Amphetamines NONE DETECTED NONE DETECTED   Tetrahydrocannabinol POSITIVE (A) NONE DETECTED   Barbiturates NONE DETECTED NONE DETECTED    Comment: (NOTE) DRUG SCREEN FOR MEDICAL PURPOSES ONLY.  IF CONFIRMATION IS NEEDED FOR ANY PURPOSE, NOTIFY LAB WITHIN 5 DAYS. LOWEST DETECTABLE LIMITS FOR URINE DRUG SCREEN Drug Class                     Cutoff (ng/mL) Amphetamine and metabolites    1000 Barbiturate and metabolites    200 Benzodiazepine                 818 Tricyclics and metabolites  300 Opiates and metabolites        300 Cocaine and metabolites        300 THC                            50 Performed at [facility], [address] [place], [place] [address]   Urinalysis, Routine w reflex microscopic     Status: Abnormal   Collection Time: 09/20/17  6:00 AM  Result Value Ref Range   Color, Urine AMBER (A) YELLOW    Comment: BIOCHEMICALS MAY BE AFFECTED BY COLOR   APPearance TURBID (A) CLEAR   Specific Gravity, Urine 1.028 1.005 - 1.030    pH 6.0 5.0 - 8.0   Glucose, UA NEGATIVE NEGATIVE mg/dL   Hgb urine dipstick LARGE (A) NEGATIVE   Bilirubin Urine NEGATIVE NEGATIVE   Ketones, ur 20 (A) NEGATIVE mg/dL   Protein, ur 30 (A) NEGATIVE mg/dL   Nitrite NEGATIVE NEGATIVE   Leukocytes, UA NEGATIVE NEGATIVE   RBC / HPF 0-5 0 - 5 RBC/hpf   WBC, UA 21-50 0 - 5 WBC/hpf   Bacteria, UA NONE SEEN NONE SEEN   Amorphous Crystal PRESENT     Comment: Performed at [facility], [address] [place], [place] [address]    Medications:  Current Facility-Administered Medications  Medication Dose Route Frequency Provider Last Rate Last Dose  . LORazepam (ATIVAN) tablet 1 mg  1 mg Oral Q4H PRN Ripley Fraise, MD       Current Outpatient Medications  Medication Sig Dispense Refill  . albuterol (PROVENTIL HFA;VENTOLIN HFA) 108 (90 Base) MCG/ACT inhaler Inhale 1-2 puffs into the lungs every 6 (six) hours as needed for wheezing or shortness of breath. 1 Inhaler 0    Musculoskeletal: Strength & Muscle Tone: within normal limits Gait & Station: normal Patient leans: N/A  Psychiatric Specialty Exam: Physical Exam  ROS  Blood pressure (!) 93/56, pulse 67, temperature 99.6 F (37.6 C), temperature source Oral, resp. rate 17, height _0  (1.6 m), weight 86.2 kg (190 lb), SpO2 96 %.Body mass index is 33.66 kg/m.  General Appearance: Casual  Eye Contact:  Good  Speech:  Clear and coherent when she chooses to speak  Volume:  Normal  Mood:  Appropriate; smiling or laughing throughout assessment  Affect:  Appropriate and Congruent  Thought Process:  Coherent  Orientation:  Full (Time, Place, and Person)  Thought Content:  Denies hallucinations, delusions, and paranoia  Suicidal Thoughts:  No  Homicidal Thoughts:  No  Memory:  Unable to determine related to patient will only answer questions that she wants to  Judgement:  Fair  Insight:  Unable to determine at this time  Psychomotor Activity:  Normal  Concentration:  Concentration:  Fair and Attention Span: Fair  Recall:  Unable to determine related to patient not answering all questions  Fund of Knowledge:  Fair  Language:  Good  Akathisia:  No  Handed:  Right  AIMS (if indicated):     Assets:  Housing Social Support  ADL's:  Intact  Cognition:  WNL  Sleep:        Treatment Plan Summary: Plan Patient psychiatrically cleared.  Patient to continue following up with current outpatient psychiatric provider  Disposition: No evidence of imminent risk to self or others at present.   Patient does not meet criteria for psychiatric inpatient admission.  This service was provided via telemedicine using a 2-way, interactive audio and video  technology.  Names of all persons participating in this telemedicine service and their role in this encounter. Name: Earleen Newport, NP Role: Tele assessment  Name: Dr Dwyane Dee Role: Psychiatrist  Name: Theresa [name] Role: Patient  Name:  Role:     Earleen Newport, NP 09/20/2017 12:08 PM

## 2017-09-20 NOTE — ED Notes (Signed)
Rescinded IVC Papers faxed to Magistrate prior to D/C.

## 2017-09-20 NOTE — ED Provider Notes (Signed)
[facility] EMERGENCY DEPARTMENT Provider Note   CSN: [id number] Arrival date & time: 09/20/17  1755     History   Chief Complaint Chief Complaint  Patient presents with  . V70.1    HPI Theresa [name] is a 22 y.o. female.  Patient presents under commitment, with law enforcement for evaluation of "hallucinations and wandering."  IVC petition done by a mobile crisis clinician, Janeece Agee.  Patient discharged from this ED, 6 and half hours ago after an evaluation overnight for psychotic behavior.  She had been committed during that evaluation, as well.  She was felt to be stable and recommended to follow-up with her usual psychiatric provider, after being seen by psychiatry telemetry provider today.  Level 5 caveat-altered mental status  HPI  Past Medical History:  Diagnosis Date  . Abnormal vaginal Pap smear   . Anxiety   . Bipolar disorder (HCC)   . Depression   . Migraine without aura   . Polycystic ovary syndrome   . Psychosis (HCC)   . Schizophrenia (HCC)   . STD (sexually transmitted disease) 10-14-15   chlamydia     Patient Active Problem List   Diagnosis Date Noted  . Bipolar affective disorder (HCC) 09/20/2017  . Bipolar affective disorder, mixed, severe, with psychotic behavior (HCC) 10/04/2016  . PCOS (polycystic ovarian syndrome) 01/18/2016  . Cannabis use disorder, moderate, dependence (HCC) 12/06/2015  . Tobacco use disorder 12/05/2015    History reviewed. No pertinent surgical history.   OB History    Gravida  0   Para  0   Term  0   Preterm  0   AB  0   Living  0     SAB  0   TAB  0   Ectopic  0   Multiple  0   Live Births               Home Medications    Prior to Admission medications   Medication Sig Start Date End Date Taking? Authorizing Provider  albuterol (PROVENTIL HFA;VENTOLIN HFA) 108 (90 Base) MCG/ACT inhaler Inhale 1-2 puffs into the lungs every 6 (six) hours as needed for wheezing or shortness of breath.  05/12/17  Yes Wallis Bamberg, PA-C    Family History Family History  Problem Relation Age of Onset  . Hypertension Mother   . Heart disease Mother   . Cancer Mother   . Hypertension Other   . Depression Father   . Drug abuse Father   . Bipolar disorder Father   . Heart disease Maternal Grandmother   . Pancreatitis Maternal Grandfather   . Cancer Paternal Grandmother   . Cancer Paternal Grandfather     Social History Social History   Tobacco Use  . Smoking status: Current Every Day Smoker    Packs/day: 0.50    Types: Cigarettes  . Smokeless tobacco: Never Used  Substance Use Topics  . Alcohol use: Yes    Alcohol/week: 0.0 oz  . Drug use: Yes    Types: Marijuana    Comment: pt denies     Allergies   Patient has no known allergies.   Review of Systems Review of Systems  Unable to perform ROS: Mental status change     Physical Exam Updated Vital Signs BP 119/83 (BP Location: Right Arm)   Pulse 92   Temp 97.8 F (36.6 C) (Oral)   Resp 18   Ht  (1.6 m)   Wt 86.2 kg (190 lb)  LMP 09/20/2017   SpO2 96%   BMI 33.66 kg/m   Physical Exam  Constitutional: She appears well-developed and well-nourished. She appears distressed (Patient wandering the hallway despite Hydrographic surveyor being at her side.).  Patient dressed in T-shirt and shorts, with notable blood, on the perineal region on her clothing.  HENT:  Head: Normocephalic and atraumatic.  Right Ear: External ear normal.  Left Ear: External ear normal.  Eyes: Pupils are equal, round, and reactive to light. Conjunctivae and EOM are normal.  Neck: Normal range of motion and phonation normal. Neck supple.  Cardiovascular: Normal rate, regular rhythm and normal heart sounds.  Pulmonary/Chest: Effort normal and breath sounds normal. She exhibits no bony tenderness.  Abdominal: Soft. There is no tenderness.  Musculoskeletal: Normal range of motion. She exhibits no edema or deformity.  Neurological:  She is alert. A sensory deficit is present. No cranial nerve deficit. She exhibits normal muscle tone. Coordination normal.  Normal gait.  Skin: Skin is warm, dry and intact.  Psychiatric:  Guarded affect, does not answer questions, obeyed request to sit.  She appears distracted, constantly looking around, and suspicious of the Hydrographic surveyor at her side.  Nursing note and vitals reviewed.    ED Treatments / Results  Labs (all labs ordered are listed, but only abnormal results are displayed) Labs Reviewed  RAPID URINE DRUG SCREEN, HOSP PERFORMED  BASIC METABOLIC PANEL  CBC WITH DIFFERENTIAL/PLATELET  ETHANOL    EKG None  Radiology No results found.  Procedures Procedures (including critical care time)  Medications Ordered in ED Medications - No data to display   Initial Impression / Assessment and Plan / ED Course  I have reviewed the triage vital signs and the nursing notes.  Pertinent labs & imaging results that were available during my care of the patient were reviewed by me and considered in my medical decision making (see chart for details).  Clinical Course as of Sep 21 2127  Sat Sep 20, 2017  1945 Patient here under IVC petition for hallucinations and wandering.  She is uncooperative.  She is likely responding to internal stimuli, but it is difficult to assess that.  I will pull the petition, and initiate evaluation by psychiatry.  Screening labs ordered.   [EW]  1947 Patient will be sedated and restrained as needed   [EW]  2128 Patient is unable to be compliant with efforts of TTS to evaluate her   [EW]    Clinical Course User Index [EW] Mancel Bale, MD     Patient Vitals for the past 24 hrs:  BP Temp Temp src Pulse Resp SpO2 Height Weight  09/20/17 1811 - - - - - -  (1.6 m) 86.2 kg (190 lb)  09/20/17 1810 119/83 97.8 F (36.6 C) Oral 92 18 96 % - -  09/20/17 1809 - - - - - -  (1.6 m) 86.2 kg (190 lb)    9:28 PM Reevaluation  with update and discussion. After initial assessment and treatment, an updated evaluation reveals no change in clinical status. Mancel Bale   Nursing Notes Reviewed/ Care Coordinated Applicable Imaging Reviewed Interpretation of Laboratory Data incorporated into ED treatment  Care to oncoming provider team to evaluate after return of labs for medical clearance, and TTS for assistance with disposition.   Final Clinical Impressions(s) / ED Diagnoses   Final diagnoses:  Confusion  Bizarre behavior  Involuntary commitment    ED Discharge Orders    None  Mancel Bale, MD 09/20/17 2130

## 2017-09-20 NOTE — ED Notes (Signed)
Pt on menstrual period.  Blood noted on shorts and sheet on stretcher.  Attempted to change pt's clothes but pt uncooperative and nonverbal.  Pt did allow [place] to change her shirt but not her pants.  Sitter and deputies at bedside.  Pt not following directions at this time.  Deputies removed shackles and allowed pt to stand for a few minutes but   Pt not cooperative so deputies put shackles back on pt.  Sheet on stretcher was changed.  Warm blanket given.

## 2017-09-20 NOTE — ED Notes (Signed)
Pt given breakfast meal tray. 

## 2017-09-20 NOTE — ED Triage Notes (Signed)
Patient not communicating in Triage. IVC papers taken out by mobile crisis clinician.

## 2017-09-20 NOTE — ED Provider Notes (Signed)
Patient in the ED for psychosis.  Apparently she has a history of schizophrenia, and has been using drugs recently Medications have been ordered.  Her IVC has been completed.  She will be taken out of handcuffs and placed in scrubs   Zadie Rhine, MD 09/20/17 [phone]

## 2017-09-20 NOTE — ED Notes (Signed)
TTS consult in process. 

## 2017-09-20 NOTE — BH Assessment (Signed)
  [facility] ASSESSMENT   LPC attempted to completed [facility] Assessment with pt but pt refused to speak with [facility] despite multiple prompts from this writer and the Sherriff present in the room.  TTS will attempt to complete assessment at a later time.  Olanda Downie L. Enez Monahan, MS, LPC, [facility] Therapeutic Triage Specialist  [phone]

## 2017-09-20 NOTE — ED Notes (Signed)
Nurse and female tech in with patient getting her cleaned up and placed in paper scrubs.

## 2017-09-20 NOTE — ED Notes (Signed)
Patient mother came to pick up patient, brought personal clothing.  Patient refused to change clothes, becoming tearful stating " I just want to go home", patient refused vital signs. Patient walked out holding personal clothing with mother and Chief Financial Officer.

## 2017-09-20 NOTE — BH Assessment (Signed)
[facility] Assessment Progress Note    Collateral Information: Mother and Stepfather, Pricilla Larsson and Fonnie Birkenhead [phone]) Mother and stepfather state that patient was discharged today from the ED at [facility].  Patient returned home and acted very bizarre. She took a baby bag outside full of clothes and set it in the yard, she packed her brother's suitcase with his clothes and put the bag in her car, she pulled all the pictures off the wall and put them in her car, she went walking into the yard naked, she was irritable and angry, she was slamming doors, threw plates outside.  Family takes it took forty-five minutes for the sheriff to get her into the car to get her to the hospital.  Family state that they cannot manage her behavior and would like her hospitalized at [facility].

## 2017-09-20 NOTE — ED Notes (Signed)
Pt refused blood work  

## 2017-09-21 ENCOUNTER — Encounter (HOSPITAL_COMMUNITY): Payer: Self-pay | Admitting: Registered Nurse

## 2017-09-21 LAB — COMPREHENSIVE METABOLIC PANEL
ALT: 18 U/L (ref 14–54)
AST: 28 U/L (ref 15–41)
Albumin: 4.8 g/dL (ref 3.5–5.0)
Alkaline Phosphatase: 86 U/L (ref 38–126)
Anion gap: 13 (ref 5–15)
BUN: 17 mg/dL (ref 6–20)
CO2: 23 mmol/L (ref 22–32)
Calcium: 9.5 mg/dL (ref 8.9–10.3)
Chloride: 102 mmol/L (ref 101–111)
Creatinine, Ser: 0.89 mg/dL (ref 0.44–1.00)
GFR calc Af Amer: >60 mL/min (ref >60)
GFR calc non Af Amer: >60 mL/min (ref >60)
Glucose, Bld: 154 mg/dL — ABNORMAL HIGH (ref 65–99)
Potassium: 3.1 mmol/L — ABNORMAL LOW (ref 3.5–5.1)
Sodium: 138 mmol/L (ref 135–145)
Total Bilirubin: 1.3 mg/dL — ABNORMAL HIGH (ref 0.3–1.2)
Total Protein: 7.7 g/dL (ref 6.5–8.1)

## 2017-09-21 LAB — CBC
HCT: 42.9 % (ref 36.0–46.0)
Hemoglobin: 14.4 g/dL (ref 12.0–15.0)
MCH: 30.6 pg (ref 26.0–34.0)
MCHC: 33.6 g/dL (ref 30.0–36.0)
MCV: 91.3 fL (ref 78.0–100.0)
Platelets: 312 10*3/uL (ref 150–400)
RBC: 4.7 MIL/uL (ref 3.87–5.11)
RDW: 12.1 % (ref 11.5–15.5)
WBC: 7.2 10*3/uL (ref 4.0–10.5)

## 2017-09-21 LAB — ETHANOL: Alcohol, Ethyl (B): <10 mg/dL (ref <10)

## 2017-09-21 MED ORDER — STERILE WATER FOR INJECTION IJ SOLN
INTRAMUSCULAR | Status: AC
  Filled 2017-09-21: qty 10

## 2017-09-21 MED ORDER — OLANZAPINE 5 MG PO TBDP
5.0000 mg | ORAL_TABLET | Freq: Every day | ORAL | Status: DC
  Administered 2017-09-21: 5 mg via ORAL
  Filled 2017-09-21 (×3): qty 1

## 2017-09-21 MED ORDER — ZIPRASIDONE MESYLATE 20 MG IM SOLR
20.0000 mg | Freq: Once | INTRAMUSCULAR | Status: DC
Start: 2017-09-21 — End: 2017-09-23
  Filled 2017-09-21: qty 20

## 2017-09-21 MED ORDER — ALBUTEROL SULFATE HFA 108 (90 BASE) MCG/ACT IN AERS: 1.0000 | INHALATION_SPRAY | Freq: Four times a day (QID) | RESPIRATORY_TRACT | Status: DC | PRN

## 2017-09-21 NOTE — ED Notes (Signed)
Given lunch tray.

## 2017-09-21 NOTE — ED Notes (Signed)
Pt is agitated,  Throwing blankets in the floor and fussing at sitter and officer.  attempted to redirect pt,  Pt is uncooperative at this time.

## 2017-09-21 NOTE — ED Notes (Addendum)
Pt escorted to restroom by sitter

## 2017-09-21 NOTE — ED Notes (Signed)
Consumed 100% of lunch tray

## 2017-09-21 NOTE — ED Notes (Signed)
Pt still refuses to talk.  Stares at speaker when being spoken to and smiles, but does not speak.

## 2017-09-21 NOTE — Progress Notes (Signed)
Pt's mother called inquiring about placement options for her daughter. Stated I had left a message for her earlier and pt's mother stated she had not yet received the message and provided a better telephone number to reach her at (provided below). Pt's mother stated pt has had numerous episodes of catatonia and that she is very scared for her daughter. Pt's mother stated that she has lost her job and gone through all of her savings/retirement caring for pt. Pt's mother stated that (she believes) it was Dr. Demetrius Charity at [facility] that was finally able to help pt last time when she was in a catatonic state for 2 months; pt's mother is hoping pt can either return to [facility] or that pt can be put back on the same medication that Dr. Demetrius Charity put her on, as pt has refused to take her medication. Stated I was unsure of bed placement availability but that I would share this information with pt's NP. Pt's mother was thankful and stated we would contact her if we had any updates for her.  Pt's mother, Theresa [name]: [phone]
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
## 2017-09-21 NOTE — ED Notes (Signed)
Called [facility] in attempt to get telepsych re-done because pt is now speaking. Was informed they would call to try to re-telepsych after shift change. RN made aware.

## 2017-09-21 NOTE — ED Notes (Signed)
Pt nurse on prior shift got pt to take zyprexa tablet

## 2017-09-21 NOTE — BH Assessment (Signed)
Contacted [facility] in an attempt to complete pt's [facility] Assessment. [facility] staff, [facility], stated pt continues to refuse to talk to anyone and is currently asleep and recommended calling back at a later time. Agreed this would be best for pt and stated TTS will attempt [facility] Assessment again in several hours.

## 2017-09-21 NOTE — Consult Note (Signed)
  Tele Assessment   Lovett Calender, 22 y.o., female patient returned to [facility] after her discharge with complaints from parents that patient started acting very bizarre once she got home.  Reported that patient packed suitcases for her and her brother and put them in car, taking pictures off wall and went out side naked.  Patient seen via telepsych by this provider; chart reviewed and consulted with Dr. Lucianne Muss on 09/21/17.  On evaluation Theresa [name] will not respond.  RN and sitter at bedside both states that patient is not speaking but will shake her head yes or no and that patient is following directions.  Sitter states that patient was able to say her name this morning remembering who she was since she had sat with her the day before prior to patient discharge.  Sitter states that patient was calm yesterday until her parents came to pick her up and then the patient got upset and started to act out.   During evaluation Theresa [name] is alert sitting up in bed with shackles on lower ext bilateral.  Patient is calm; but she is not speaking and will not shake her head yes or no to answer questions.  Patient only looking into camera.  Patient has flat affect.  Unable to determine if patient is responding to internal/external stimuli or delusional thoughts related to patient not responding at all to conversation.  Nursing staff informs that patient is able to respond to commands and that patient did have a normal conversation with her mother when mother came to visit.    TTS will consult family for collateral information.    Recommendations:  Start Zyprexa Zydis 5 mg daily for depression, psychosis, and mood stabilization  Disposition:  Recommend psychiatric Inpatient admission when medically cleared.    Assunta Found, NP

## 2017-09-21 NOTE — ED Notes (Signed)
After multiple attempts pt will not take Zyprexa tablet

## 2017-09-21 NOTE — Progress Notes (Signed)
Left HIPAA-compliant message for pt's mother requesting she return my call at her earliest convenience.  [name]: [phone]

## 2017-09-22 LAB — RAPID URINE DRUG SCREEN, HOSP PERFORMED
Amphetamines: NOT DETECTED
Barbiturates: NOT DETECTED
Benzodiazepines: NOT DETECTED
Cocaine: NOT DETECTED
Opiates: NOT DETECTED
Tetrahydrocannabinol: POSITIVE — AB

## 2017-09-22 NOTE — ED Notes (Signed)
Pt. Stated she had to use the restroom, provided a bedside commode. Pt. Refused to get out of the bed to use it. This sitter removed the bedside commode from the room.

## 2017-09-22 NOTE — BH Assessment (Signed)
Pt reports she sleeps on and off and is eating fine. When clinician attempted to discuss odd behaviors by parents the pt became quiet an did not want to discuss. Pt states she feels she has been patient and been there for days and just wants out and/or get into another room and shower. Pt also stated she doesn't understand why she is there or what brought her in to the ED.

## 2017-09-22 NOTE — ED Notes (Signed)
Pt is starting to have  Bizarre behavior. Is now staring at staff and not saying anything. Statistician took pt to restroom

## 2017-09-22 NOTE — ED Notes (Signed)
Tech attempting to update vital signs

## 2017-09-22 NOTE — ED Notes (Signed)
Have given pt water as well as more crackers

## 2017-09-22 NOTE — ED Notes (Signed)
Pt is cooperative. Deputy has take off patient's hand cuffs. Will continue to monitor.

## 2017-09-22 NOTE — ED Notes (Signed)
Pt currently taking a shower. Sitter and sheriff standing nearby

## 2017-09-22 NOTE — ED Notes (Addendum)
[phone] Fonnie Birkenhead Pt's mother  [phone]   Pt's mother upset about patient not being able to leave. Notified mother that she would not be able to leave, the doctor would have to resend paper work. Told her she would be taken care of here. Mother stated, "[facility] is taking so long." This nurse said,  "Yes, this is a long process. The whole mental health system is messed up unfortunately and it takes a long time." Mother stated, "This is just inhumane. They shouldn't be taking this long" This nurse said, "I understand you're upset. When she gets to [facility], she will be able to have one on one care. Here, a Scientist, research (life sciences) is with her at all times, but this nurse cannot give her [facility] treatment while here due to all the other critical patients. She will be kept safe here."

## 2017-09-22 NOTE — ED Notes (Signed)
Have given patient crackers and juice bc patient has been refusing all other trays. Just learned from a previous nurse that she will only eat if she can open her own food. Pt is now eating crackers. Pt states" I just want to get out of here." Notified her that once there was placement for her she would be able to leave.

## 2017-09-22 NOTE — ED Notes (Addendum)
Pt. Unlocked bed and tried to move the bed and herself out of the room. Officer handcuffed left wrist to bed.

## 2017-09-22 NOTE — ED Notes (Signed)
Have paged [facility] to bring pt. A bag lunch. Pt cooperative and calm. Is talking in complete sentences

## 2017-09-22 NOTE — ED Notes (Signed)
Officer and tech reports that pt was shackled right arm and leg. Pt got up and walked , unlocked bed and kicked bed. Officer shackled left arm to bed

## 2017-09-22 NOTE — Progress Notes (Signed)
Pt meets inpatient criteria per Nira Conn, NP. Referral information has been sent to the following hospitals: [facility]  [facility]  [facility]  [facility]  [facility]  [facility]  [facility]   Disposition CSWs will continue to assist with placement needs.   Wells Guiles, LCSW, LCAS Disposition [name] [facility]/TTS [phone]

## 2017-09-22 NOTE — ED Notes (Signed)
PT took a shower

## 2017-09-22 NOTE — ED Notes (Signed)
Have given the phone to patient to speak with mother

## 2017-09-22 NOTE — BHH Counselor (Signed)
Received a call on the Crisis Line from someone stating they were pt's mother. I did not acknowledge or give any information as no release of information was apparent at that time. No time was given to research whether Release of Information permission was given to pt's mother by checking her record. Caller stated she was pt's mother and yelled "how long are you going to keep a girl in the ED chained to a bed." I advised she would have to talk to the ED regarding any concerns of that nature. She yelled "y'all are the ones who are keeping a girl with psychosis in the ED."  Caller yelled. "how long before y'all find her a bed somewhere and unchain her." At that point caller disconnected call abruptly.  Beryle Flock, MS, CRC, [facility] Triage Specialist [facility]

## 2017-09-22 NOTE — ED Notes (Signed)
pt. Ambulated to the bathroom with sheriff and this sitter.

## 2017-09-22 NOTE — ED Notes (Signed)
Sheriff removed right ankle shackle

## 2017-09-22 NOTE — ED Notes (Addendum)
TTS called for consult. Machine is in room

## 2017-09-22 NOTE — ED Notes (Signed)
Pt is now conversing with staff. Seems to be alert and oriented x4

## 2017-09-22 NOTE — ED Notes (Signed)
Pt no longer talking, but will shake head yes and no

## 2017-09-22 NOTE — ED Notes (Addendum)
Sheriff has taken all restraints off patient. Pt cooperative at this time

## 2017-09-23 ENCOUNTER — Other Ambulatory Visit: Payer: Self-pay

## 2017-09-23 ENCOUNTER — Inpatient Hospital Stay
Admission: AD | Admit: 2017-09-23 | Discharge: 2017-10-02 | DRG: 885 | Disposition: A | Discharge status: Home / Self Care | Payer: No Typology Code available for payment source | Attending: Psychiatry | Admitting: Psychiatry

## 2017-09-23 ENCOUNTER — Encounter (HOSPITAL_COMMUNITY): Payer: Self-pay | Admitting: Registered Nurse

## 2017-09-23 DIAGNOSIS — F203 Undifferentiated schizophrenia: Secondary | ICD-10-CM | POA: Diagnosis present

## 2017-09-23 DIAGNOSIS — Z79899 Other long term (current) drug therapy: Secondary | ICD-10-CM

## 2017-09-23 DIAGNOSIS — N390 Urinary tract infection, site not specified: Secondary | ICD-10-CM | POA: Diagnosis present

## 2017-09-23 DIAGNOSIS — Z9119 Patient's noncompliance with other medical treatment and regimen: Secondary | ICD-10-CM

## 2017-09-23 DIAGNOSIS — Z9114 Patient's other noncompliance with medication regimen: Secondary | ICD-10-CM

## 2017-09-23 DIAGNOSIS — Z6830 Body mass index (BMI) 30.0-30.9, adult: Secondary | ICD-10-CM | POA: Diagnosis not present

## 2017-09-23 DIAGNOSIS — Z818 Family history of other mental and behavioral disorders: Secondary | ICD-10-CM | POA: Diagnosis not present

## 2017-09-23 DIAGNOSIS — F17210 Nicotine dependence, cigarettes, uncomplicated: Secondary | ICD-10-CM | POA: Diagnosis present

## 2017-09-23 DIAGNOSIS — E669 Obesity, unspecified: Secondary | ICD-10-CM | POA: Diagnosis present

## 2017-09-23 DIAGNOSIS — F1220 Cannabis dependence, uncomplicated: Secondary | ICD-10-CM | POA: Diagnosis present

## 2017-09-23 DIAGNOSIS — G4700 Insomnia, unspecified: Secondary | ICD-10-CM | POA: Diagnosis present

## 2017-09-23 DIAGNOSIS — F4010 Social phobia, unspecified: Secondary | ICD-10-CM | POA: Diagnosis present

## 2017-09-23 DIAGNOSIS — F17200 Nicotine dependence, unspecified, uncomplicated: Secondary | ICD-10-CM | POA: Diagnosis present

## 2017-09-23 DIAGNOSIS — E282 Polycystic ovarian syndrome: Secondary | ICD-10-CM | POA: Diagnosis present

## 2017-09-23 DIAGNOSIS — F209 Schizophrenia, unspecified: Secondary | ICD-10-CM | POA: Diagnosis not present

## 2017-09-23 DIAGNOSIS — Z813 Family history of other psychoactive substance abuse and dependence: Secondary | ICD-10-CM | POA: Diagnosis not present

## 2017-09-23 MED ORDER — ALUM & MAG HYDROXIDE-SIMETH 200-200-20 MG/5ML PO SUSP: 30.0000 mL | ORAL | Status: DC | PRN

## 2017-09-23 MED ORDER — MAGNESIUM HYDROXIDE 400 MG/5ML PO SUSP: 30.0000 mL | Freq: Every day | ORAL | Status: DC | PRN

## 2017-09-23 MED ORDER — ACETAMINOPHEN 325 MG PO TABS
650.0000 mg | ORAL_TABLET | Freq: Four times a day (QID) | ORAL | Status: DC | PRN
  Administered 2017-10-01: 650 mg via ORAL
  Filled 2017-09-23: qty 2

## 2017-09-23 MED ORDER — LORAZEPAM 2 MG PO TABS
2.0000 mg | ORAL_TABLET | Freq: Four times a day (QID) | ORAL | Status: DC | PRN
  Administered 2017-09-24 – 2017-09-28 (×4): 2 mg via ORAL
  Filled 2017-09-23 (×4): qty 1

## 2017-09-23 MED ORDER — OLANZAPINE 5 MG PO TBDP: 10.0000 mg | ORAL_TABLET | Freq: Every day | ORAL | Status: DC

## 2017-09-23 MED ORDER — LORAZEPAM 2 MG/ML IJ SOLN: 2.0000 mg | Freq: Four times a day (QID) | INTRAMUSCULAR | Status: DC | PRN

## 2017-09-23 MED ORDER — ALBUTEROL SULFATE HFA 108 (90 BASE) MCG/ACT IN AERS
1.0000 | INHALATION_SPRAY | Freq: Four times a day (QID) | RESPIRATORY_TRACT | Status: DC | PRN
  Filled 2017-09-23: qty 6.7

## 2017-09-23 NOTE — ED Notes (Signed)
Made a round on patient. Pt sitting up at bedside. I asked pt how she was doing? Pt stared at me and would not speak to me

## 2017-09-23 NOTE — ED Notes (Signed)
Received report on pt, pt standing at foot of bed, singing, sitter at bedside, pt has right foot in shackle, unwilling to allow RN to look at shackle, pt was able to state that she is at [facility] emergency room, did allow RN to take vitals after several attempts of asking, [employer] at bedside also spoke with pt to allow staff to help her, pt agrees to vitals. Pt continues to stare at staff when asked questions,

## 2017-09-23 NOTE — ED Notes (Signed)
PT sitting calmly on bed talking to tech asking what time it was and what time breakfast would arrive, then the officer walked up and patient shut down and no longer would acknowledge tech.

## 2017-09-23 NOTE — Tx Team (Signed)
Initial Treatment Plan 09/23/2017 11:11 PM Lovett Calender [id number]    PATIENT STRESSORS: Financial difficulties Medication change or noncompliance Substance abuse   PATIENT STRENGTHS: Capable of independent living Communication skills Motivation for treatment/growth Physical Health   PATIENT IDENTIFIED PROBLEMS: Lack of communication    Incompliant with prescribed medicine    Depression/Anxeit     Substance miss use         DISCHARGE CRITERIA:  Adequate post-discharge living arrangements Improved stabilization in mood, thinking, and/or behavior Medical problems require only outpatient monitoring Motivation to continue treatment in a less acute level of care Need for constant or close observation no longer present Reduction of life-threatening or endangering symptoms to within safe limits  PRELIMINARY DISCHARGE PLAN: Attend 12-step recovery group Participate in family therapy Placement in alternative living arrangements Return to previous living arrangement  PATIENT/FAMILY INVOLVEMENT: This treatment plan has been presented to and reviewed with the patient, Theresa [name], The patient have been given the opportunity to ask questions and make suggestions.  Lelan Pons, RN 09/23/2017, 11:11 PM

## 2017-09-23 NOTE — ED Notes (Signed)
PT was standing in her room at bedside table eating crackers calmly, still cuffed to the bed when the officer walked up and told her to sit on the bed or he would make her sit PT just looked at the officer and he repeated himself then proceeded to put on gloves and put hands on the patient to make her sit.

## 2017-09-23 NOTE — BH Assessment (Signed)
Patient has been accepted to [facility].  Accepting physician is Dr. Toni Amend.  Attending Physician will be Dr. Toni Amend.  Patient has been assigned to room 305-A, by [facility] Charge Nurse [employer] [facility].  Call report to [phone].  Representative/Transfer Coordinator is Mayer Masker Patient pre-admitted by [facility] Patient Access Mertie Clause)    Lillia Abed, [facility], [facility]) made aware of acceptance.

## 2017-09-23 NOTE — ED Notes (Addendum)
Pt assisted to restroom with this sitter, Technical sales engineer, Copy. Pt washed up, changed underwear, pad, & scrub pants. Pt escorted back to room with sheriff & sitter.

## 2017-09-23 NOTE — ED Notes (Signed)
Pt re-assessed by [facility] 

## 2017-09-23 NOTE — ED Notes (Addendum)
Pt unlocked bed & moved it. This sitter locked the bed & removed the bedside table from the room.

## 2017-09-23 NOTE — ED Notes (Signed)
Offered zyprexa and pt refused. Just stares at me. Explained the importance of medication. Continues to refuse medication. EDP notifed

## 2017-09-23 NOTE — ED Notes (Signed)
Pt refused temp 

## 2017-09-23 NOTE — Consult Note (Signed)
Psychiatry: I received a request today from TTS to evaluate this patient's chart for possible transfer.  22 year old woman with a known and established history of schizophrenia who has been sitting in the emergency room at any pen for several days now exhibiting psychotic symptoms with the mother requesting transfer to [facility].  Patient's chart indicates that she is clearly psychotic and requires inpatient treatment.  I told TTS that we would be glad to take the patient in transfer.  Orders will be completed.

## 2017-09-23 NOTE — ED Notes (Signed)
Pt given breakfast tray. Pt ate pretty good this morning.

## 2017-09-23 NOTE — ED Notes (Signed)
Pt standing up clapping. Refused lunch. States I dont want it.

## 2017-09-23 NOTE — Progress Notes (Signed)
New admit from [facility], Patient is known to [place] from previous encounter, she is alert and oriented x 4, patient is trying hard not to speak to this writer on assessment questions she is not mute, shake her head for yes or no questions, she appear unwilling to speak, exhibiting psychotic tendencies , aware of her environment, educate patient on safety ,nutrition  And group therapy provided to her as part of treatment regimen, skin check and body search is complete no contraband found and skin is clean with tattoos at her back, no further issues noted, 15 minute rounding is in progress.denies any SI/HI and no signs of AVH noted at this time.

## 2017-09-23 NOTE — BH Assessment (Addendum)
Received phone call from Mother Macario Golds at [phone], via crisis line here at [facility]. Mother requesting update, asking when her daughter would be placed. Educated mother on the general process of pt placement, and mother states '' yes she gets catatonic and doesn't move and gets so sick. She's shackled to the bed and I'm worried about her. She has been there for four days and this is ridiculous '' mother reports daughter signed ROI and that it is in chart but unable to verify this at this time, so did not provide details of daughters care, informing mother that could only take collateral information. Mother did provide pertinent collateral information, stating that her daughter responds best to ativan when catatonic, and that she has been on Risperdal and ativan and done well on that combination, and ativan with Abilify maintenna. Mother requesting that patient return to [facility] IP if possible. Phone call made to refer to [facility] for potential placement with cameal, awaiting return call. Also informed NP of above information. ( patient will have telepsych due to continued acuity per notes in the chart and for further medication review.) discussed above with NP. Carney Bern in SW made aware to have ROI scanned into chart. Will con't to seek inpatient treatment at this time.

## 2017-09-23 NOTE — ED Notes (Signed)
PT asked tech if she could use the bathroom, tech asked officer to [employer] PT officer said he would like her to use the bed pan.

## 2017-09-23 NOTE — ED Notes (Signed)
Pt singing & clapping

## 2017-09-23 NOTE — Progress Notes (Signed)
Disposition [name] called and spoke to pt's ED Nurse, Darleen Crocker. And asked her to check for an ROI for [place] to discuss patient with her mother.  If there is an ROI, I asked that it be scanned and uploaded to pt's chart.  Alos asked to be notified if there is no ROI to speak to mom.  Timmothy Euler. Kaylyn Lim, MSW, LCSWA Disposition Clinical Social Work [phone] (cell) [phone] (office)

## 2017-09-23 NOTE — ED Notes (Signed)
PT closed room door and tech opened. Door is now open

## 2017-09-23 NOTE — ED Notes (Signed)
Pt refused lunch tray, sitter and [employer] remain at bedside,

## 2017-09-23 NOTE — ED Notes (Addendum)
Pt ambulated to BR accompanied by staff and deputy. Pt assisted with perineal care due to being on menstrual cycle. Changed mesh underwear, new pad and new scrubs. Pt cooperative, but slow to respond to request. Stares at nurse and cries. Pt ambulated back to room without incident. Deputy at bedside. Applied right leg shackle. Requested for the officer to leave shackle off leg and officer states no

## 2017-09-23 NOTE — ED Notes (Addendum)
Cuff placed on left leg by sheriff

## 2017-09-23 NOTE — ED Notes (Signed)
Pt calmly sitting on the side of the bed. Pt is staring at officer & staff & crying.

## 2017-09-23 NOTE — ED Provider Notes (Signed)
The patient has been accepted by Dr. Florentina Addison at [facility]   Eber Hong, MD 09/23/17 [phone]

## 2017-09-23 NOTE — ED Notes (Signed)
No release of information noted. Theresa [name] notified

## 2017-09-23 NOTE — ED Notes (Signed)
Pt was singing & clapping really loud. I asked the pt to calm down & not to be so loud. She looked & this sitter & said, "Is it hurting you?". I said no but there are sick people here. She stopped & just starred.

## 2017-09-23 NOTE — Consult Note (Signed)
Tele Assessment   Theresa [name], 22 y.o., female patient presented to [facility] related to Bizarre behavior.  Patient seen via telepsych by this provider; chart reviewed and consulted with Dr. Lucianne Muss on 09/23/17.  Patients initial presentation to [facility] was related to an IVC that was taking out by her family stating that the patient was using meth, marijuana and psychosis. During patient's initial visit patient was refusing to talk; but she would shake her head yes or not to answer question and would make some short sentences when she chose to speak.  Patient was psychiatrically cleared and discharged home.  Once home patient mother reported that patient was acting bizarre by packing suitcase for herself and brother and taking pictures off the wall.  Patient was then brought back to the ED.     09/23/17 On evaluation Theresa [name] reports she doesn't know why she was brought to the hospital states that she does not remember acting bizarre at home.  Patient denies suicidal/self harm/homicidal ideation, psychosis, and paranoia.  Patient was able to tell where she was and what city she was in; also date of birth and age.  Patient states that she has not spoken to anyone in her family since she has been in the hospital.  Patient was asked if she was sleeping okay at night she responded "Are you sleeping okay? When this provider responded yes patient shook her head yes.  Patient complemented that she was speaking out loud during this interview instead of shaking her head yes or no and patient smiled. During evaluation Theresa [name] is alert/oriented x 4; calm/cooperative; and mood congruent with affect.  At this time patient does not appear to be responding to internal/external stimuli or delusional thoughts.  Patient responded appropriately to all questions.  She was not looking around as thought she was responding to voices or visions.   Patient denied suicidal/self-harm/homicidal ideation, psychosis, and  paranoia.  Patient answered question appropriately.  Patient does have a history of Schizophrenia diagnosed at age of 59 and has history inpatient psychiatric treatment.  It is unsure if patient has been taking psychotropic medications since discharged.  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  It has been 4 days since patient's initial ED admit on 09/19/17 it is possible that patient has started to clear up from drug induced psychosis.  Attempted to contact patient's mother Theresa [name]) [phone] and [phone] but no answer.  Voice message left to return call.   Spoke to patient's nurse Zella Ball, RN who informs that patient has not been talking much that she is just looking at staff.  States that patient has said she needed to go to the bathroom and patient stated that she was not going to take her medication because was not prescribed any; and that she wanted to go home.  Also informs that patient is not looking around or talking to things not there; that it dose not appear that patient is responding to internal/external stimuli.   Zella Ball also reported that the incident last night may have been related to an officer that the patient did not get along with; but has not had any verbal or aggressive outburst since.    Recommendations:  Will continue to seek inpatient treatment until more collateral information has been collected from mother of patient.  Nursing informed to encourage patient to take her medications for stabilization and possible discharge if she is responding better.  Continue Zyprexa zydis 5 mg daily     Disposition: Recommend psychiatric Inpatient admission when medically cleared. Will continue to monitor the progress of patient; if better prior to finding a bed may be able to discharge with outpatient services.     Assunta Found, NP

## 2017-09-24 DIAGNOSIS — F203 Undifferentiated schizophrenia: Principal | ICD-10-CM

## 2017-09-24 LAB — HEMOGLOBIN A1C
Hgb A1c MFr Bld: 5.3 % (ref 4.8–5.6)
Mean Plasma Glucose: 105.41 mg/dL

## 2017-09-24 LAB — LIPID PANEL
Cholesterol: 108 mg/dL (ref 0–200)
HDL: 42 mg/dL (ref >40)
LDL Cholesterol: 58 mg/dL (ref 0–99)
Total CHOL/HDL Ratio: 2.6 RATIO
Triglycerides: 42 mg/dL (ref <150)
VLDL: 8 mg/dL (ref 0–40)

## 2017-09-24 LAB — TSH: TSH: 2.032 u[IU]/mL (ref 0.350–4.500)

## 2017-09-24 MED ORDER — LAMOTRIGINE 25 MG PO TABS
25.0000 mg | ORAL_TABLET | Freq: Every day | ORAL | Status: DC
  Administered 2017-09-27 – 2017-09-30 (×3): 25 mg via ORAL
  Filled 2017-09-24 (×5): qty 1

## 2017-09-24 MED ORDER — OLANZAPINE 5 MG PO TBDP
20.0000 mg | ORAL_TABLET | Freq: Every day | ORAL | Status: DC
  Administered 2017-09-24 – 2017-09-26 (×3): 20 mg via ORAL
  Filled 2017-09-24 (×4): qty 4

## 2017-09-24 NOTE — Progress Notes (Signed)
Recreation Therapy Notes  INPATIENT RECREATION THERAPY ASSESSMENT  Patient Details Name: FENIX RUPPE MRN: [id number] DOB: Oct 03, 1995 Today's Date: 09/24/2017 Patient would not respond to any verbal ques.      Information Obtained From:    Able to Participate in Assessment/Interview:    Patient Presentation:    Reason for Admission (Per Patient):    Patient Stressors:    Coping Skills:      Leisure Interests (2+):     Frequency of Recreation/Participation:    Awareness of Community Resources:     [employer]:     Current Use:    If no, Barriers?:    Expressed Interest in [employer] Information:    [place] of Residence:     Patient Main Form of Transportation:    Patient Strengths:     Patient Identified Areas of Improvement:     Patient Goal for Hospitalization:     Current SI (including self-harm):     Current HI:     Current AVH:    Staff Intervention Plan:    Consent to Intern Participation:    Errica Dutil 09/24/2017, 1:17 PM

## 2017-09-24 NOTE — BHH Group Notes (Addendum)
LCSW Group Therapy Note  09/24/2017 1:00 PM  Type of Therapy/Topic:  Group Therapy:  Emotion Regulation  Participation Level:  None   Description of Group:    The purpose of this group is to assist patients in learning to regulate negative emotions and experience positive emotions. Patients will be guided to discuss ways in which they have been vulnerable to their negative emotions. These vulnerabilities will be juxtaposed with experiences of positive emotions or situations, and patients will be challenged to use positive emotions to combat negative ones. Special emphasis will be placed on coping with negative emotions in conflict situations, and patients will process healthy conflict resolution skills.  Therapeutic Goals: 1. Patient will identify two positive emotions or experiences to reflect on in order to balance out negative emotions 2. Patient will label two or more emotions that they find the most difficult to experience 3. Patient will demonstrate positive conflict resolution skills through discussion and/or role plays  Summary of Patient Progress:  Theresa [name] did not speak at all during today's group.  When [name] asked for her name she stuck out her hand to show her ID badge.  [name] attempted to engage Theresa [name] in the group discussion, but she only stared at [name] blankly.  Theresa [name] did remain the entire time of the group.     Therapeutic Modalities:   Cognitive Behavioral Therapy Feelings Identification Dialectical Behavioral Therapy

## 2017-09-24 NOTE — Progress Notes (Signed)
Nursing note 7a-7p  Pt observed ambulating in hall with a steady gait. Pt has had minimal verbal interaction with peers and staff on unit this shift. Displayed a flat affect and mood-sullen. Minimal interaction with this Clinical research associate pt refuses to verbally interact, glaring eye contact then displays a fixed smile during assessment. Pt able to answer questions by shaking head up and down for "yes" and left to right for "no". Pt denies  pain  denies SI/HI, also denies any audio or visual hallucinations at this time. Pt able to perform ADL's and is no longer in scrubs at this time.  Pt able to attend groups without issue. Pt required redirection to have socks or shoes on while ambulating in the hall, educated on importance and fall risks. Pt continues to remain safe on the unit and is observed by rounding every 15 min. No signs or symptoms of pain or distress noted.  Pt unable to verbalize a goal for today. Pt able to contract for safety by shaking head and denies further need at this time. RN will continue to monitor.

## 2017-09-24 NOTE — Progress Notes (Signed)
Recreation Therapy Notes  Date: 09/24/2017  Time: 9:30 am  Location: Craft Room  Behavioral response:Disengaged  Intervention Topic: Values  Discussion/Intervention:  Group content today was focused on values. The group identified what values are and where they come from. Individuals expressed some values and how many they have. Patients described how they go about adding or removing values. The group described the importance of having values and how they go about using them in daily life. Patient participated in the intervention "My Values" where they were able to pick out values that were important to them and make a visual aide.  Clinical Observations/Feedback:  Patient came to group and did not contribute anything to group. Individual was disengaged during the entire group session. She would not leave when group was over, other staff had to encourage her to leave the craft room using many prompts.  Coco Sharpnack LRT/CTRS          Gracyn Allor 09/24/2017 12:06 PM
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
## 2017-09-24 NOTE — [facility] Suicide Risk Assessment (Signed)
[facility] INPATIENT:  Family/Significant Other Suicide Prevention Education  Suicide Prevention Education:  Education Completed; mother Theresa [name]), mother, [phone] and [phone] been identified by the patient as the family member/significant other with whom the patient will be residing, and identified as the person(s) who will aid the patient in the event of a mental health crisis (suicidal ideations/suicide attempt).  With written consent from the patient, the family member/significant other has been provided the following suicide prevention education, prior to the and/or following the discharge of the patient.  The suicide prevention education provided includes the following:  Suicide risk factors  Suicide prevention and interventions  National Suicide Hotline telephone number  [facility] assessment telephone number  [facility] Emergency Assistance 911  [facility] and/or Residential Mobile Crisis Unit telephone number  Request made of family/significant other to:  Remove weapons (e.g., guns, rifles, knives), all items previously/currently identified as safety concern.    Remove drugs/medications (over-the-counter, prescriptions, illicit drugs), all items previously/currently identified as a safety concern.  The family member/significant other verbalizes understanding of the suicide prevention education information provided.  The family member/significant other agrees to remove the items of safety concern listed above.  Mother reports that the patient was smoking some THC and 2 days before she went into psychosis she was crying and sweating a lot and could not sleep. Mother reports that the patient was hearing voices and was getting aggressive. She says that she loaded her car up and taking pictures down and reading the bible. Then she reports that she then went into catatonia. Patient was denied for disability. Patient quit her job in December 2018. No  guns/weapons in the home. All questions answered.     Theresa [name] 09/24/2017, 10:21 AM

## 2017-09-24 NOTE — H&P (Signed)
Psychiatric Admission Assessment Adult  Patient Identification: Theresa [name] MRN:  [id number] Date of Evaluation:  09/24/2017 Chief Complaint:  Bipolar affective disoder Principal Diagnosis: Schizophrenia (HCC) Diagnosis:   Patient Active Problem List   Diagnosis Date Noted  . Schizophrenia (HCC) [F20.9] 09/23/2017  . Bipolar affective disorder (HCC) [F31.9] 09/20/2017  . Bipolar affective disorder, mixed, severe, with psychotic behavior (HCC) [F31.64] 10/04/2016  . PCOS (polycystic ovarian syndrome) [E28.2] 01/18/2016  . Cannabis use disorder, moderate, dependence (HCC) [F12.20] 12/06/2015  . Tobacco use disorder [F17.200] 12/05/2015   History of Present Illness: 22 year old woman transferred [facility] in [place].  History obtained from patient to some degree also from the patient's mother.  Current bout of illness has been bad for at least several weeks.  Patient was at the emergency room in [facility] and was discharged with the opinion that she did not meet commitment criteria.  Rapidly came back to the hospital obviously very psychotic.  Patient is very limited in how much she will talk.  With coaching from her mother she was able to offer a couple of words to me.  Patient is demanding from her mother that she be discharged but is not able to think clearly articulate anything about her current condition.  Hygiene appears to be adequate a healthy appears to be adequate.  Not showing any signs of attempting to hurt her self or anyone else.  Social history: Patient lives with her mother.  Not able to work.  Has been turned down once for disability.  Medical history: Polycystic ovarian syndrome overweight but otherwise stable  Substance abuse: Cannabis use in the past not able to answer questions about it currently but the mother says she thinks she is been smoking pot. Associated Signs/Symptoms: Depression Symptoms:  anhedonia, impaired memory, (Hypo) Manic  Symptoms:  Impulsivity, Irritable Mood, Anxiety Symptoms:  Social Anxiety, Psychotic Symptoms:  Paranoia, PTSD Symptoms: Negative Total Time spent with patient: 1 hour  Past Psychiatric History: Patient has a history of multiple hospitalizations with a diagnosis of either schizophrenia or schizoaffective disorder.  Has shown improvement in the past with a variety of antipsychotics.  Mother thinks that Zyprexa worked better in the long run because it did not cause as much akathisia.  No history of suicide attempts or violence.  Has a long history of noncompliance with medicine because of poor insight.  Is the patient at risk to self? Yes.    Has the patient been a risk to self in the past 6 months? Yes.    Has the patient been a risk to self within the distant past? Yes.    Is the patient a risk to others? No.  Has the patient been a risk to others in the past 6 months? No.  Has the patient been a risk to others within the distant past? No.   Prior Inpatient Therapy:   Prior Outpatient Therapy:    Alcohol Screening: 1. How often do you have a drink containing alcohol?: 2 to 4 times a month 2. How many drinks containing alcohol do you have on a typical day when you are drinking?: 3 or 4 3. How often do you have six or more drinks on one occasion?: Less than monthly AUDIT-C Score: 4 4. How often during the last year have you found that you were not able to stop drinking once you had started?: Never 5. How often during the last year have you failed to do what was normally  expected from you becasue of drinking?: Never 6. How often during the last year have you needed a first drink in the morning to get yourself going after a heavy drinking session?: Never 7. How often during the last year have you had a feeling of guilt of remorse after drinking?: Never 8. How often during the last year have you been unable to remember what happened the night before because you had been drinking?: Never 9.  Have you or someone else been injured as a result of your drinking?: No 10. Has a relative or friend or a doctor or another health worker been concerned about your drinking or suggested you cut down?: No Alcohol Use Disorder Identification Test Final Score (AUDIT): 4 Intervention/Follow-up: AUDIT Score <7 follow-up not indicated Substance Abuse History in the last 12 months:  Yes.   Consequences of Substance Abuse: Medical Consequences:  Worsening psychotic disorder Previous Psychotropic Medications: Yes  Psychological Evaluations: Yes  Past Medical History:  Past Medical History:  Diagnosis Date  . Abnormal vaginal Pap smear   . Anxiety   . Bipolar disorder (HCC)   . Depression   . Migraine without aura   . Polycystic ovary syndrome   . Psychosis (HCC)   . Schizophrenia (HCC)   . STD (sexually transmitted disease) 10-14-15   chlamydia    History reviewed. No pertinent surgical history. Family History:  Family History  Problem Relation Age of Onset  . Hypertension Mother   . Heart disease Mother   . Cancer Mother   . Hypertension Other   . Depression Father   . Drug abuse Father   . Bipolar disorder Father   . Heart disease Maternal Grandmother   . Pancreatitis Maternal Grandfather   . Cancer Paternal Grandmother   . Cancer Paternal Grandfather    Family Psychiatric  History: Patient had a grandparent with a psychotic disorder Tobacco Screening: Have you used any form of tobacco in the last 30 days? (Cigarettes, Smokeless Tobacco, Cigars, and/or Pipes): No Social History:  Social History   Substance and Sexual Activity  Alcohol Use Yes  . Alcohol/week: 0.0 oz     Social History   Substance and Sexual Activity  Drug Use Yes  . Types: Marijuana   Comment: pt denies    Additional Social History:                           Allergies:  No Known Allergies Lab Results:  Results for orders placed or performed during the hospital encounter of 09/23/17 (from  the past 48 hour(s))  Hemoglobin A1c     Status: None   Collection Time: 09/24/17  7:01 AM  Result Value Ref Range   Hgb A1c MFr Bld 5.3 4.8 - 5.6 %    Comment: (NOTE) Pre diabetes:          5.7%-6.4% Diabetes:              >6.4% Glycemic control for   <7.0% adults with diabetes    Mean Plasma Glucose 105.41 mg/dL    Comment: Performed at [facility], [address], [place], [place] [address]  Lipid panel     Status: None   Collection Time: 09/24/17  7:01 AM  Result Value Ref Range   Cholesterol 108 0 - 200 mg/dL   Triglycerides 42 <604 mg/dL   HDL 42 >54 mg/dL   Total CHOL/HDL Ratio 2.6 RATIO   VLDL 8 0 -  40 mg/dL   LDL Cholesterol 58 0 - 99 mg/dL    Comment:        Total Cholesterol/HDL:CHD Risk Coronary Heart Disease Risk Table                     Men   Women  1/2 Average Risk   3.4   3.3  Average Risk       5.0   4.4  2 X Average Risk   9.6   7.1  3 X Average Risk  23.4   11.0        Use the calculated Patient Ratio above and the CHD Risk Table to determine the patient's CHD Risk.        ATP III CLASSIFICATION (LDL):  <100     mg/dL   Optimal  161-096  mg/dL   Near or Above                    Optimal  130-159  mg/dL   Borderline  045-409  mg/dL   High  >811     mg/dL   Very High Performed at [facility], [address] [place], [place] [address]   TSH     Status: None   Collection Time: 09/24/17  7:01 AM  Result Value Ref Range   TSH 2.032 0.350 - 4.500 uIU/mL    Comment: Performed by a 3rd Generation assay with a functional sensitivity of <=0.01 uIU/mL. Performed at [facility], [address] [place], [place] [address]     Blood Alcohol level:  Lab Results  Component Value Date   [facility] <10 09/21/2017   ETH <10 09/19/2017    Metabolic Disorder Labs:  Lab Results  Component Value Date   HGBA1C 5.3 09/24/2017   MPG 105.41 09/24/2017   MPG 111 10/05/2016   Lab Results  Component Value Date   PROLACTIN 4.0 10/14/2015    Lab Results  Component Value Date   CHOL 108 09/24/2017   TRIG 42 09/24/2017   HDL 42 09/24/2017   CHOLHDL 2.6 09/24/2017   VLDL 8 09/24/2017   LDLCALC 58 09/24/2017   LDLCALC 54 10/05/2016    Current Medications: Current Facility-Administered Medications  Medication Dose Route Frequency Provider Last Rate Last Dose  . acetaminophen (TYLENOL) tablet 650 mg  650 mg Oral Q6H PRN Treven Holtman T, MD      . albuterol (PROVENTIL HFA;VENTOLIN HFA) 108 (90 Base) MCG/ACT inhaler 1-2 puff  1-2 puff Inhalation Q6H PRN Sebert Stollings T, MD      . alum & mag hydroxide-simeth (MAALOX/MYLANTA) 200-200-20 MG/5ML suspension 30 mL  30 mL Oral Q4H PRN Braxxton Stoudt, Jackquline Denmark, MD      . Melene Muller ON 09/25/2017] lamoTRIgine (LAMICTAL) tablet 25 mg  25 mg Oral Daily Anyi Fels T, MD      . LORazepam (ATIVAN) tablet 2 mg  2 mg Oral Q6H PRN Kylo Gavin, Jackquline Denmark, MD       Or  . LORazepam (ATIVAN) injection 2 mg  2 mg Intramuscular Q6H PRN Gatha Mcnulty T, MD      . magnesium hydroxide (MILK OF MAGNESIA) suspension 30 mL  30 mL Oral Daily PRN Niylah Hassan T, MD      . OLANZapine zydis (ZYPREXA) disintegrating tablet 20 mg  20 mg Oral QHS Tyarra Nolton T, MD       PTA Medications: Medications Prior to Admission  Medication Sig Dispense Refill Last Dose  .  albuterol (PROVENTIL HFA;VENTOLIN HFA) 108 (90 Base) MCG/ACT inhaler Inhale 1-2 puffs into the lungs every 6 (six) hours as needed for wheezing or shortness of breath. 1 Inhaler 0 unknown    Musculoskeletal: Strength & Muscle Tone: within normal limits Gait & Station: normal Patient leans: N/A  Psychiatric Specialty Exam: Physical Exam  Nursing note and vitals reviewed. Constitutional: She appears well-developed and well-nourished.  HENT:  Head: Normocephalic and atraumatic.  Eyes: Pupils are equal, round, and reactive to light. Conjunctivae are normal.  Neck: Normal range of motion.  Cardiovascular: Regular rhythm and normal heart sounds.  Respiratory:  Effort normal. No respiratory distress.  GI: Soft.  Musculoskeletal: Normal range of motion.  Neurological: She is alert.  Skin: Skin is warm and dry.  Psychiatric: Her affect is blunt. Her speech is delayed. She is slowed. Thought content is paranoid. Cognition and memory are impaired.    Review of Systems  Unable to perform ROS: Psychiatric disorder    Blood pressure 125/68, pulse 62, temperature 98.2 F (36.8 C), temperature source Oral, resp. rate 18, height  (1.626 m), weight 81.6 kg (180 lb), last menstrual period 09/20/2017, SpO2 100 %.Body mass index is 30.9 kg/m.  General Appearance: Fairly Groomed  Eye Contact:  Minimal  Speech:  Blocked  Volume:  Decreased  Mood:  Irritable  Affect:  Congruent  Thought Process:  Disorganized  Orientation:  Negative  Thought Content:  Negative  Suicidal Thoughts:  No  Homicidal Thoughts:  No  Memory:  Immediate;   Fair Recent;   Fair Remote;   Fair  Judgement:  Impaired  Insight:  Shallow  Psychomotor Activity:  Decreased  Concentration:  Concentration: Poor  Recall:  Poor  Fund of Knowledge:  Poor  Language:  Poor  Akathisia:  No  Handed:  Right  AIMS (if indicated):     Assets:  Housing Physical Health Social Support  ADL's:  Intact  Cognition:  Impaired,  Mild  Sleep:  Number of Hours: 2    Treatment Plan Summary: Daily contact with patient to assess and evaluate symptoms and progress in treatment, Medication management and Plan Patient is being started back on olanzapine 20 mg at night also lamotrigine 25 mg a day which where the combination that was helpful when she was discharged 2 times ago.  Patient will be involved in groups.  15-minute checks.  Work on assessing improvement in symptoms regularly.  Ordered EKG and hemoglobin A1c lipid panel and TSH.  Observation Level/Precautions:  15 minute checks  Laboratory:  HbAIC  Psychotherapy:    Medications:    Consultations:    Discharge Concerns:    Estimated  LOS:  Other:     Physician Treatment Plan for Primary Diagnosis: Schizophrenia (HCC) Long Term Goal(s): Improvement in symptoms so as ready for discharge  Short Term Goals: Ability to demonstrate self-control will improve and Compliance with prescribed medications will improve  Physician Treatment Plan for Secondary Diagnosis: Principal Problem:   Schizophrenia (HCC) Active Problems:   Cannabis use disorder, moderate, dependence (HCC)   PCOS (polycystic ovarian syndrome)  Long Term Goal(s): Improvement in symptoms so as ready for discharge  Short Term Goals: Ability to identify and develop effective coping behaviors will improve, Ability to maintain clinical measurements within normal limits will improve and Compliance with prescribed medications will improve  I certify that inpatient services furnished can reasonably be expected to improve the patient's condition.    Mordecai Rasmussen, MD 5/1/20198:40 PM
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
## 2017-09-24 NOTE — Tx Team (Addendum)
Interdisciplinary Treatment and Diagnostic Plan Update  09/24/2017 Time of Session: 2:15pm Theresa [name] MRN: [id number]  Principal Diagnosis: <principal problem not specified>  Secondary Diagnoses: Active Problems:   Schizophrenia (HCC)   Current Medications:  Current Facility-Administered Medications  Medication Dose Route Frequency Provider Last Rate Last Dose  . acetaminophen (TYLENOL) tablet 650 mg  650 mg Oral Q6H PRN Clapacs, John T, MD      . albuterol (PROVENTIL HFA;VENTOLIN HFA) 108 (90 Base) MCG/ACT inhaler 1-2 puff  1-2 puff Inhalation Q6H PRN Clapacs, John T, MD      . alum & mag hydroxide-simeth (MAALOX/MYLANTA) 200-200-20 MG/5ML suspension 30 mL  30 mL Oral Q4H PRN Clapacs, John T, MD      . LORazepam (ATIVAN) tablet 2 mg  2 mg Oral Q6H PRN Clapacs, Jackquline Denmark, MD       Or  . LORazepam (ATIVAN) injection 2 mg  2 mg Intramuscular Q6H PRN Clapacs, John T, MD      . magnesium hydroxide (MILK OF MAGNESIA) suspension 30 mL  30 mL Oral Daily PRN Clapacs, John T, MD      . OLANZapine zydis (ZYPREXA) disintegrating tablet 10 mg  10 mg Oral QHS Clapacs, John T, MD       PTA Medications: Medications Prior to Admission  Medication Sig Dispense Refill Last Dose  . albuterol (PROVENTIL HFA;VENTOLIN HFA) 108 (90 Base) MCG/ACT inhaler Inhale 1-2 puffs into the lungs every 6 (six) hours as needed for wheezing or shortness of breath. 1 Inhaler 0 unknown    Patient Stressors: Financial difficulties Medication change or noncompliance Substance abuse  Patient Strengths: Capable of independent living Barrister's clerk for treatment/growth Physical Health  Treatment Modalities: Medication Management, Group therapy, Case management,  1 to 1 session with clinician, Psychoeducation, Recreational therapy.   Physician Treatment Plan for Primary Diagnosis: <principal problem not specified> Long Term Goal(s):     Short Term Goals:    Medication Management: Evaluate  patient's response, side effects, and tolerance of medication regimen.  Therapeutic Interventions: 1 to 1 sessions, Unit Group sessions and Medication administration.  Evaluation of Outcomes: Progressing  Physician Treatment Plan for Secondary Diagnosis: Active Problems:   Schizophrenia (HCC)  Long Term Goal(s):     Short Term Goals:       Medication Management: Evaluate patient's response, side effects, and tolerance of medication regimen.  Therapeutic Interventions: 1 to 1 sessions, Unit Group sessions and Medication administration.  Evaluation of Outcomes: Progressing   RN Treatment Plan for Primary Diagnosis: <principal problem not specified> Long Term Goal(s): Knowledge of disease and therapeutic regimen to maintain health will improve  Short Term Goals: Ability to verbalize feelings will improve, Ability to identify and develop effective coping behaviors will improve and Compliance with prescribed medications will improve  Medication Management: RN will administer medications as ordered by provider, will assess and evaluate patient's response and provide education to patient for prescribed medication. RN will report any adverse and/or side effects to prescribing provider.  Therapeutic Interventions: 1 on 1 counseling sessions, Psychoeducation, Medication administration, Evaluate responses to treatment, Monitor vital signs and CBGs as ordered, Perform/monitor CIWA, COWS, AIMS and Fall Risk screenings as ordered, Perform wound care treatments as ordered.  Evaluation of Outcomes: Progressing   LCSW Treatment Plan for Primary Diagnosis: <principal problem not specified> Long Term Goal(s): Safe transition to appropriate next level of care at discharge, Engage patient in therapeutic group addressing interpersonal concerns.  Short Term Goals: Engage patient in aftercare planning  with referrals and resources, Increase social support, Increase ability to appropriately verbalize  feelings, Facilitate patient progression through stages of change regarding substance use diagnoses and concerns, Identify triggers associated with mental health/substance abuse issues and Increase skills for wellness and recovery  Therapeutic Interventions: Assess for all discharge needs, 1 to 1 time with Social worker, Explore available resources and support systems, Assess for adequacy in community support network, Educate family and significant other(s) on suicide prevention, Complete Psychosocial Assessment, Interpersonal group therapy.  Evaluation of Outcomes: Progressing   Progress in Treatment: Attending groups: Yes. Participating in groups: No. Taking medication as prescribed: Yes. Toleration medication: Yes. Family/Significant other contact made: Yes, individual(s) contacted:  [name]), mother, [phone] Patient understands diagnosis: Yes. Discussing patient identified problems/goals with staff: Yes. Medical problems stabilized or resolved: Yes. Denies suicidal/homicidal ideation: Yes. Issues/concerns per patient self-inventory: No. Other:   New problem(s) identified: No, Describe:  None  New Short Term/Long Term Goal(s): N/A  Discharge Plan or Barriers: To return home with mother and follow up with outpatient services at [facility] in [place], [place].  Reason for Continuation of Hospitalization: Depression Medication stabilization  Estimated Length of Stay: 7 days  Recreational Therapy: Patient Stressors: N/A  Patient Goal: Patient will engage in groups without prompting or encouragement from LRT x3 group sessions within 5 recreation therapy group sessions  Attendees: Patient:  09/24/2017 2:46 PM  Physician: Mordecai Rasmussen, MD 09/24/2017 2:46 PM  Nursing: Hulan Amato, RN 09/24/2017 2:46 PM  RN Care Manager: 09/24/2017 2:46 PM  Social Worker: Johny Shears, LCSWA 09/24/2017 2:46 PM  Recreational Therapist: Danella Deis. Dreama Saa, LRT 09/24/2017 2:46 PM  Other: Huey Romans, LCSW 09/24/2017 2:46 PM  Other:  09/24/2017 2:46 PM  Other: 09/24/2017 2:46 PM    Scribe for Treatment Team: Johny Shears, LCSW 09/24/2017 2:46 PM

## 2017-09-24 NOTE — BHH Counselor (Signed)
[name] called patients mother Theresa [name] [phone] and [phone]) to obtain collateral information. [name] left a voicemail for the patients mother to call back.    Johny Shears, MSW, [place], LCASA 09/24/2017 10:01 AM

## 2017-09-25 LAB — LIPID PANEL
Cholesterol: 102 mg/dL (ref 0–200)
HDL: 41 mg/dL (ref >40)
LDL Cholesterol: 52 mg/dL (ref 0–99)
Total CHOL/HDL Ratio: 2.5 RATIO
Triglycerides: 43 mg/dL (ref <150)
VLDL: 9 mg/dL (ref 0–40)

## 2017-09-25 LAB — HEMOGLOBIN A1C
Hgb A1c MFr Bld: 5.2 % (ref 4.8–5.6)
Mean Plasma Glucose: 102.54 mg/dL

## 2017-09-25 LAB — TSH: TSH: 2.26 u[IU]/mL (ref 0.350–4.500)

## 2017-09-25 NOTE — Progress Notes (Signed)
Patient ID: Theresa [name], female   DOB: [birth year], 22 y.o.   MRN: [id number] Catatonic, delayed cognitive response, thought blocking, childlike, glaring, starring, tranced; elective mutism. Time consuming efforts during medication administration. Mother, during visitation, optimistic "once the medication is on board and effective, you will see a different person..." K+=3.1 on 09/21/17.

## 2017-09-25 NOTE — BHH Group Notes (Signed)
  09/25/2017  Time: 1PM  Type of Therapy/Topic:  Group Therapy:  Balance in Life  Participation Level:  Minimal  Description of Group:   This group will address the concept of balance and how it feels and looks when one is unbalanced. Patients will be encouraged to process areas in their lives that are out of balance and identify reasons for remaining unbalanced. Facilitators will guide patients in utilizing problem-solving interventions to address and correct the stressor making their life unbalanced. Understanding and applying boundaries will be explored and addressed for obtaining and maintaining a balanced life. Patients will be encouraged to explore ways to assertively make their unbalanced needs known to significant others in their lives, using other group members and facilitator for support and feedback.  Therapeutic Goals: 1. Patient will identify two or more emotions or situations they have that consume much of in their lives. 2. Patient will identify signs/triggers that life has become out of balance:  3. Patient will identify two ways to set boundaries in order to achieve balance in their lives:  4. Patient will demonstrate ability to communicate their needs through discussion and/or role plays  Summary of Patient Progress: Pt participated in group, but contributed a minimal amount to group discussion. When prompted by [name], pt stated she would like to focus more on her family and focus less on depression. Pt declined to elaborate on her answers.   Therapeutic Modalities:   Cognitive Behavioral Therapy Solution-Focused Therapy Assertiveness Training  Heidi Dach, MSW, LCSW Clinical Social Worker 09/25/2017 1:49 PM

## 2017-09-25 NOTE — Progress Notes (Signed)
Recreation Therapy Notes  Date: 09/25/2017  Time: 9:30 am   Location: Craft Room   Behavioral response: N/A   Intervention Topic: Creative Expressions  Discussion/Intervention: Patient did not attend group.   Clinical Observations/Feedback:  Patient did not attend group.   Caty Tessler LRT/CTRS        Sharisa Toves 09/25/2017 10:16 AM 

## 2017-09-25 NOTE — Plan of Care (Signed)
Patient slept for Estimated Hours of 6.45; Precautionary checks every 15 minutes for safety maintained, room free of safety hazards, patient sustains no injury or falls during this shift.  Problem: Education: Goal: Knowledge of [facility] Education information/materials will improve Outcome: Progressing Goal: Emotional status will improve Outcome: Progressing Goal: Mental status will improve Outcome: Progressing Goal: Verbalization of understanding the information provided will improve Outcome: Progressing   Problem: Activity: Goal: Interest or engagement in activities will improve Outcome: Progressing Goal: Sleeping patterns will improve Outcome: Progressing   Problem: Coping: Goal: Ability to verbalize frustrations and anger appropriately will improve Outcome: Progressing Goal: Ability to demonstrate self-control will improve Outcome: Progressing   Problem: Health Behavior/Discharge Planning: Goal: Identification of resources available to assist in meeting health care needs will improve Outcome: Progressing Goal: Compliance with treatment plan for underlying cause of condition will improve Outcome: Progressing   Problem: Physical Regulation: Goal: Ability to maintain clinical measurements within normal limits will improve Outcome: Progressing   Problem: Safety: Goal: Periods of time without injury will increase Outcome: Progressing

## 2017-09-25 NOTE — Progress Notes (Signed)
Patient observed a nurse leaving the unit and attempted to leave the unit by going out of exit door. Patient opened  the exit door and got into the [facility] area. MHT followed patient into the [facility] area, holding the door open so that the nurse in the [facility] could not open the door exiting the unit. As the nurse in the [facility] pulled on the door, it opened at the time that the door to the unit was open. It was at this time that the escorted patient back into the unit. No injuries sustained. MD made aware of the situation, no new orders obtained. On-coming staff will be made aware of near miss occurrence. Will continue to monitor patient.

## 2017-09-25 NOTE — Progress Notes (Signed)
[facility] MD Progress Note  09/25/2017 7:54 PM Theresa [name]  MRN:  [id number] Subjective: Follow-up 22 year old woman with a history of recurrent psychotic disorder.  Patient is neatly dressed but does not communicate or interact appropriately.  We attempted to sit down and talk with her today and the patient stared at me and made strange faces for about 20 minutes.  Occasionally would repeat what I said back to me.  Did not answer any questions lucidly.  It looks like she has been selectively taking some of her medicine specifically not the lamotrigine.  Later in the afternoon staff found that the patient was aggressively trying to run out of the unit and had to be stopped from getting through the security doors.  Patient has no insight but appears physically stable Principal Problem: Schizophrenia (HCC) Diagnosis:   Patient Active Problem List   Diagnosis Date Noted  . Schizophrenia (HCC) [F20.9] 09/23/2017  . Bipolar affective disorder (HCC) [F31.9] 09/20/2017  . Bipolar affective disorder, mixed, severe, with psychotic behavior (HCC) [F31.64] 10/04/2016  . PCOS (polycystic ovarian syndrome) [E28.2] 01/18/2016  . Cannabis use disorder, moderate, dependence (HCC) [F12.20] 12/06/2015  . Tobacco use disorder [F17.200] 12/05/2015   Total Time spent with patient: 30 minutes  Past Psychiatric History: History of recurrent episodes of psychosis with noncompliance  Past Medical History:  Past Medical History:  Diagnosis Date  . Abnormal vaginal Pap smear   . Anxiety   . Bipolar disorder (HCC)   . Depression   . Migraine without aura   . Polycystic ovary syndrome   . Psychosis (HCC)   . Schizophrenia (HCC)   . STD (sexually transmitted disease) 10-14-15   chlamydia    History reviewed. No pertinent surgical history. Family History:  Family History  Problem Relation Age of Onset  . Hypertension Mother   . Heart disease Mother   . Cancer Mother   . Hypertension Other   . Depression  Father   . Drug abuse Father   . Bipolar disorder Father   . Heart disease Maternal Grandmother   . Pancreatitis Maternal Grandfather   . Cancer Paternal Grandmother   . Cancer Paternal Grandfather    Family Psychiatric  History: None Social History:  Social History   Substance and Sexual Activity  Alcohol Use Yes  . Alcohol/week: 0.0 oz     Social History   Substance and Sexual Activity  Drug Use Yes  . Types: Marijuana   Comment: pt denies    Social History   Socioeconomic History  . Marital status: Single    Spouse name: Not on file  . Number of children: Not on file  . Years of education: Not on file  . Highest education level: Not on file  Occupational History  . Not on file  Social Needs  . Financial resource strain: Not on file  . Food insecurity:    Worry: Not on file    Inability: Not on file  . Transportation needs:    Medical: Not on file    Non-medical: Not on file  Tobacco Use  . Smoking status: Current Every Day Smoker    Packs/day: 0.50    Types: Cigarettes  . Smokeless tobacco: Never Used  Substance and Sexual Activity  . Alcohol use: Yes    Alcohol/week: 0.0 oz  . Drug use: Yes    Types: Marijuana    Comment: pt denies  . Sexual activity: Yes    Partners: Male    Birth control/protection:  None  Lifestyle  . Physical activity:    Days per week: Not on file    Minutes per session: Not on file  . Stress: Not on file  Relationships  . Social connections:    Talks on phone: Not on file    Gets together: Not on file    Attends religious service: Not on file    Active member of club or organization: Not on file    Attends meetings of clubs or organizations: Not on file    Relationship status: Not on file  Other Topics Concern  . Not on file  Social History Narrative  . Not on file   Additional Social History:                         Sleep: Fair  Appetite:  Fair  Current Medications: Current Facility-Administered  Medications  Medication Dose Route Frequency Provider Last Rate Last Dose  . acetaminophen (TYLENOL) tablet 650 mg  650 mg Oral Q6H PRN Clapacs, John T, MD      . albuterol (PROVENTIL HFA;VENTOLIN HFA) 108 (90 Base) MCG/ACT inhaler 1-2 puff  1-2 puff Inhalation Q6H PRN Clapacs, John T, MD      . alum & mag hydroxide-simeth (MAALOX/MYLANTA) 200-200-20 MG/5ML suspension 30 mL  30 mL Oral Q4H PRN Clapacs, John T, MD      . lamoTRIgine (LAMICTAL) tablet 25 mg  25 mg Oral Daily Clapacs, John T, MD      . LORazepam (ATIVAN) tablet 2 mg  2 mg Oral Q6H PRN Clapacs, Jackquline Denmark, MD   2 mg at 09/24/17 2121   Or  . LORazepam (ATIVAN) injection 2 mg  2 mg Intramuscular Q6H PRN Clapacs, John T, MD      . magnesium hydroxide (MILK OF MAGNESIA) suspension 30 mL  30 mL Oral Daily PRN Clapacs, John T, MD      . OLANZapine zydis (ZYPREXA) disintegrating tablet 20 mg  20 mg Oral QHS Clapacs, Jackquline Denmark, MD   20 mg at 09/24/17 2121    Lab Results:  Results for orders placed or performed during the hospital encounter of 09/23/17 (from the past 48 hour(s))  Hemoglobin A1c     Status: None   Collection Time: 09/24/17  7:01 AM  Result Value Ref Range   Hgb A1c MFr Bld 5.3 4.8 - 5.6 %    Comment: (NOTE) Pre diabetes:          5.7%-6.4% Diabetes:              >6.4% Glycemic control for   <7.0% adults with diabetes    Mean Plasma Glucose 105.41 mg/dL    Comment: Performed at [facility], [address] [place], [place] [address]  Lipid panel     Status: None   Collection Time: 09/24/17  7:01 AM  Result Value Ref Range   Cholesterol 108 0 - 200 mg/dL   Triglycerides 42 <604 mg/dL   HDL 42 >54 mg/dL   Total CHOL/HDL Ratio 2.6 RATIO   VLDL 8 0 - 40 mg/dL   LDL Cholesterol 58 0 - 99 mg/dL    Comment:        Total Cholesterol/HDL:CHD Risk Coronary Heart Disease Risk Table                     Men   Women  1/2 Average Risk   3.4   3.3  Average Risk  5.0   4.4  2 X Average Risk   9.6   7.1  3 X Average  Risk  23.4   11.0        Use the calculated Patient Ratio above and the CHD Risk Table to determine the patient's CHD Risk.        ATP III CLASSIFICATION (LDL):  <100     mg/dL   Optimal  983-382  mg/dL   Near or Above                    Optimal  130-159  mg/dL   Borderline  505-397  mg/dL   High  >673     mg/dL   Very High Performed at [facility], [address] [place], [place] [address]   TSH     Status: None   Collection Time: 09/24/17  7:01 AM  Result Value Ref Range   TSH 2.032 0.350 - 4.500 uIU/mL    Comment: Performed by a 3rd Generation assay with a functional sensitivity of <=0.01 uIU/mL. Performed at [facility], [address] [place], [place] [address]   Hemoglobin A1c     Status: None   Collection Time: 09/25/17  6:37 AM  Result Value Ref Range   Hgb A1c MFr Bld 5.2 4.8 - 5.6 %    Comment: (NOTE) Pre diabetes:          5.7%-6.4% Diabetes:              >6.4% Glycemic control for   <7.0% adults with diabetes    Mean Plasma Glucose 102.54 mg/dL    Comment: Performed at [facility], [address], [place], [place] [address]  TSH     Status: None   Collection Time: 09/25/17  6:37 AM  Result Value Ref Range   TSH 2.260 0.350 - 4.500 uIU/mL    Comment: Performed by a 3rd Generation assay with a functional sensitivity of <=0.01 uIU/mL. Performed at [facility], [address] [place], [place] [address]   Lipid panel     Status: None   Collection Time: 09/25/17  6:37 AM  Result Value Ref Range   Cholesterol 102 0 - 200 mg/dL   Triglycerides 43 <268 mg/dL   HDL 41 >34 mg/dL   Total CHOL/HDL Ratio 2.5 RATIO   VLDL 9 0 - 40 mg/dL   LDL Cholesterol 52 0 - 99 mg/dL    Comment:        Total Cholesterol/HDL:CHD Risk Coronary Heart Disease Risk Table                     Men   Women  1/2 Average Risk   3.4   3.3  Average Risk       5.0   4.4  2 X Average Risk   9.6   7.1  3 X Average Risk  23.4   11.0        Use the  calculated Patient Ratio above and the CHD Risk Table to determine the patient's CHD Risk.        ATP III CLASSIFICATION (LDL):  <100     mg/dL   Optimal  196-222  mg/dL   Near or Above                    Optimal  130-159  mg/dL   Borderline  979-892  mg/dL   High  >119  mg/dL   Very High Performed at [facility], [address] [place], [place] [address]     Blood Alcohol level:  Lab Results  Component Value Date   [facility] <10 09/21/2017   ETH <10 09/19/2017    Metabolic Disorder Labs: Lab Results  Component Value Date   HGBA1C 5.2 09/25/2017   MPG 102.54 09/25/2017   MPG 105.41 09/24/2017   Lab Results  Component Value Date   PROLACTIN 4.0 10/14/2015   Lab Results  Component Value Date   CHOL 102 09/25/2017   TRIG 43 09/25/2017   HDL 41 09/25/2017   CHOLHDL 2.5 09/25/2017   VLDL 9 09/25/2017   LDLCALC 52 09/25/2017   LDLCALC 58 09/24/2017    Physical Findings: AIMS: Facial and Oral Movements Muscles of Facial Expression: None, normal Lips and Perioral Area: None, normal Jaw: None, normal Tongue: None, normal,Extremity Movements Upper (arms, wrists, hands, fingers): None, normal Lower (legs, knees, ankles, toes): None, normal, Trunk Movements Neck, shoulders, hips: None, normal, Overall Severity Severity of abnormal movements (highest score from questions above): None, normal Incapacitation due to abnormal movements: None, normal Patient's awareness of abnormal movements (rate only patient's report): No Awareness, Dental Status Current problems with teeth and/or dentures?: No Does patient usually wear dentures?: No  CIWA:  CIWA-Ar Total: 0 COWS:  COWS Total Score: 2  Musculoskeletal: Strength & Muscle Tone: within normal limits Gait & Station: normal Patient leans: N/A  Psychiatric Specialty Exam: Physical Exam  Nursing note and vitals reviewed. Constitutional: She appears well-developed and well-nourished.  HENT:  Head: Normocephalic  and atraumatic.  Eyes: Pupils are equal, round, and reactive to light. Conjunctivae are normal.  Neck: Normal range of motion.  Cardiovascular: Regular rhythm and normal heart sounds.  Respiratory: Effort normal. No respiratory distress.  GI: Soft.  Musculoskeletal: Normal range of motion.  Neurological: She is alert.  Skin: Skin is warm and dry.  Psychiatric: Her affect is blunt and inappropriate. She is agitated. Thought content is paranoid. Cognition and memory are impaired. She expresses impulsivity. She is noncommunicative.    Review of Systems  Unable to perform ROS: Psychiatric disorder    Blood pressure 100/68, pulse 92, temperature 97.9 F (36.6 C), temperature source Oral, resp. rate 18, height  (1.626 m), weight 81.6 kg (180 lb), last menstrual period 09/20/2017, SpO2 99 %.Body mass index is 30.9 kg/m.  General Appearance: Fairly Groomed  Eye Contact:  Fair  Speech:  Negative  Volume:  Decreased  Mood:  Negative  Affect:  Inappropriate  Thought Process:  NA  Orientation:  Negative  Thought Content:  Negative  Suicidal Thoughts:  No  Homicidal Thoughts:  No  Memory:  Negative  Judgement:  Negative  Insight:  Negative  Psychomotor Activity:  Restlessness  Concentration:  Concentration: Negative  Recall:  Negative  Fund of Knowledge:  Negative  Language:  Negative  Akathisia:  Negative  Handed:  Right  AIMS (if indicated):     Assets:  Housing Physical Health  ADL's:  Intact  Cognition:  Impaired,  Mild  Sleep:  Number of Hours: 6.45     Treatment Plan Summary: Daily contact with patient to assess and evaluate symptoms and progress in treatment, Medication management and Plan Although the patient was not talking back with me I did explain to her the diagnosis and the treatment plan and the rationale for medicine.  Plan for now is to continue the olanzapine at full dose.  If she does not take  the lamotrigine that is not nearly as important.  Spoke with  staff and they are going to keep a close eye on her attempts to escape the unit.  Keep trying to engage her in groups activities and assessment.  Mordecai Rasmussen, MD 09/25/2017, 7:54 PM

## 2017-09-25 NOTE — [facility] Group Notes (Signed)
LCSW Group Therapy Note 09/25/2017 9:00 AM  Type of Therapy and Topic:  Group Therapy:  Setting Goals  Participation Level:  Did Not Attend  Description of Group: In this process group, patients discussed using strengths to work toward goals and address challenges.  Patients identified two positive things about themselves and one goal they were working on.  Patients were given the opportunity to share openly and support each other's plan for self-empowerment.  The group discussed the value of gratitude and were encouraged to have a daily reflection of positive characteristics or circumstances.  Patients were encouraged to identify a plan to utilize their strengths to work on current challenges and goals.  Therapeutic Goals 1. Patient will verbalize personal strengths/positive qualities and relate how these can assist with achieving desired personal goals 2. Patients will verbalize affirmation of peers plans for personal change and goal setting 3. Patients will explore the value of gratitude and positive focus as related to successful achievement of goals 4. Patients will verbalize a plan for regular reinforcement of personal positive qualities and circumstances.  Summary of Patient Progress:  October was invited to today's group, but chose not to attend.     Therapeutic Modalities Cognitive Behavioral Therapy Motivational Interviewing    Alease Frame, [place] 09/25/2017 1:54 PM

## 2017-09-25 NOTE — Plan of Care (Signed)
Pt observed ambulating in hall with a steady gait. Pt has had minimal verbal interaction with peers and staff on unit this shift. Patient refused to take her morning medication verbally stating, "I don't want to take that Lamictal." Patient became argumentative and demanding to staff in regards to opening the door to the laundry room to retrieve her clothes. "Open the door" in a very demanding tone. Patient encouraged to speak to others kindly. Patient then stated, "Will you please open the door". As this Clinical research associate opened the laundry room door, patient also stated, "As if you didn't already know." This statement was made in a very condescending tone. Patient then checked her clothes in the dryer and were still damp, so she closed the door to the dryer and stood there after she restarted it. This Clinical research associate informed patient that she could not stand in the room by herself that we would have to come back to check the dryer, patient refused to go by saying, "No, I'm waiting here." This Clinical research associate again informed the patient that she could not stand in the laundry room to wait for her clothes, to wait outside of the room, Patient walked past this Clinical research associate and stated, "This is some bull shit man." Patient observed pacing the unit, stopping frequently by the nurses station glaring in at staff then when eye contact has been made pt forms a fixed smile tilting her head to the side, still glaring at staff. Does not attend group therapy sessions. Milieu remains safe with q 15 minute safety checks.

## 2017-09-26 NOTE — Plan of Care (Signed)
Pt conts to wander halls and stare at individuals threw window. Pt is isolative to self. Pt is medication compliant this shift. Pt is redirectable. Pt remains on Q 15 mins safety rounds. Will cont to monitor pt.  Problem: Education: Goal: Knowledge of [facility] Education information/materials will improve Outcome: Progressing Goal: Emotional status will improve Outcome: Progressing Goal: Mental status will improve Outcome: Progressing Goal: Verbalization of understanding the information provided will improve Outcome: Progressing   Problem: Activity: Goal: Interest or engagement in activities will improve Outcome: Progressing Goal: Sleeping patterns will improve Outcome: Progressing   Problem: Coping: Goal: Ability to verbalize frustrations and anger appropriately will improve Outcome: Progressing Goal: Ability to demonstrate self-control will improve Outcome: Progressing   Problem: Health Behavior/Discharge Planning: Goal: Identification of resources available to assist in meeting health care needs will improve Outcome: Progressing Goal: Compliance with treatment plan for underlying cause of condition will improve Outcome: Progressing   Problem: Physical Regulation: Goal: Ability to maintain clinical measurements within normal limits will improve Outcome: Progressing   Problem: Safety: Goal: Periods of time without injury will increase Outcome: Progressing

## 2017-09-26 NOTE — Progress Notes (Signed)
Recreation Therapy Notes  Date: 09/26/2017  Time: 9:30 am   Location: Craft Room   Behavioral response: N/A   Intervention Topic: Leisure  Discussion/Intervention: Patient did not attend group.   Clinical Observations/Feedback:  Patient did not attend group.   Theresa [name] LRT/CTRS         Theresa [name] 09/26/2017 10:25 AM 

## 2017-09-26 NOTE — Plan of Care (Signed)
Patient slept for Estimated Hours of 6; Precautionary checks every 15 minutes for safety maintained, room free of safety hazards, patient sustains no injury or falls during this shift.  Problem: Education: Goal: Knowledge of [facility] Education information/materials will improve Outcome: Progressing Goal: Emotional status will improve Outcome: Progressing Goal: Mental status will improve Outcome: Progressing Goal: Verbalization of understanding the information provided will improve Outcome: Progressing   Problem: Activity: Goal: Interest or engagement in activities will improve Outcome: Progressing Goal: Sleeping patterns will improve Outcome: Progressing   Problem: Coping: Goal: Ability to verbalize frustrations and anger appropriately will improve Outcome: Progressing Goal: Ability to demonstrate self-control will improve Outcome: Progressing   Problem: Health Behavior/Discharge Planning: Goal: Identification of resources available to assist in meeting health care needs will improve Outcome: Progressing Goal: Compliance with treatment plan for underlying cause of condition will improve Outcome: Progressing   Problem: Physical Regulation: Goal: Ability to maintain clinical measurements within normal limits will improve Outcome: Progressing   Problem: Safety: Goal: Periods of time without injury will increase Outcome: Progressing

## 2017-09-26 NOTE — Progress Notes (Signed)
   09/26/17 1500  Clinical Encounter Type  Visited With Patient  Visit Type Initial   Patient participated in chaplain led group, focus on communication and meditation.

## 2017-09-26 NOTE — Progress Notes (Signed)
[facility] MD Progress Note  09/26/2017 4:06 PM Theresa [name]  MRN:  [id number] Subjective: Follow-up for 22 year old woman with a history of recurrent psychosis.  Patient is taking care of her basic ADLs but remains extremely withdrawn and paranoid.  In conversation she largely stays silent and makes a lot of staring faces and responds with nonsensical statements intermittently.  Constantly asking when she can go.  Insight is poor.  Yesterday tried to elope from the unit a couple of times.  Does appear to have taken her medicine.  At least she spoke to me a couple times today.  Denies suicidal ideation. Principal Problem: Schizophrenia (HCC) Diagnosis:   Patient Active Problem List   Diagnosis Date Noted  . Schizophrenia (HCC) [F20.9] 09/23/2017  . Bipolar affective disorder (HCC) [F31.9] 09/20/2017  . Bipolar affective disorder, mixed, severe, with psychotic behavior (HCC) [F31.64] 10/04/2016  . PCOS (polycystic ovarian syndrome) [E28.2] 01/18/2016  . Cannabis use disorder, moderate, dependence (HCC) [F12.20] 12/06/2015  . Tobacco use disorder [F17.200] 12/05/2015   Total Time spent with patient: 30 minutes  Past Psychiatric History: History of recurrent episodes of psychosis  Past Medical History:  Past Medical History:  Diagnosis Date  . Abnormal vaginal Pap smear   . Anxiety   . Bipolar disorder (HCC)   . Depression   . Migraine without aura   . Polycystic ovary syndrome   . Psychosis (HCC)   . Schizophrenia (HCC)   . STD (sexually transmitted disease) 10-14-15   chlamydia    History reviewed. No pertinent surgical history. Family History:  Family History  Problem Relation Age of Onset  . Hypertension Mother   . Heart disease Mother   . Cancer Mother   . Hypertension Other   . Depression Father   . Drug abuse Father   . Bipolar disorder Father   . Heart disease Maternal Grandmother   . Pancreatitis Maternal Grandfather   . Cancer Paternal Grandmother   . Cancer Paternal  Grandfather    Family Psychiatric  History: Unknown Social History:  Social History   Substance and Sexual Activity  Alcohol Use Yes  . Alcohol/week: 0.0 oz     Social History   Substance and Sexual Activity  Drug Use Yes  . Types: Marijuana   Comment: pt denies    Social History   Socioeconomic History  . Marital status: Single    Spouse name: Not on file  . Number of children: Not on file  . Years of education: Not on file  . Highest education level: Not on file  Occupational History  . Not on file  Social Needs  . Financial resource strain: Not on file  . Food insecurity:    Worry: Not on file    Inability: Not on file  . Transportation needs:    Medical: Not on file    Non-medical: Not on file  Tobacco Use  . Smoking status: Current Every Day Smoker    Packs/day: 0.50    Types: Cigarettes  . Smokeless tobacco: Never Used  Substance and Sexual Activity  . Alcohol use: Yes    Alcohol/week: 0.0 oz  . Drug use: Yes    Types: Marijuana    Comment: pt denies  . Sexual activity: Yes    Partners: Male    Birth control/protection: None  Lifestyle  . Physical activity:    Days per week: Not on file    Minutes per session: Not on file  . Stress: Not on file  Relationships  . Social connections:    Talks on phone: Not on file    Gets together: Not on file    Attends religious service: Not on file    Active member of club or organization: Not on file    Attends meetings of clubs or organizations: Not on file    Relationship status: Not on file  Other Topics Concern  . Not on file  Social History Narrative  . Not on file   Additional Social History:                         Sleep: Fair  Appetite:  Fair  Current Medications: Current Facility-Administered Medications  Medication Dose Route Frequency Provider Last Rate Last Dose  . acetaminophen (TYLENOL) tablet 650 mg  650 mg Oral Q6H PRN Loranda Mastel T, MD      . albuterol (PROVENTIL  HFA;VENTOLIN HFA) 108 (90 Base) MCG/ACT inhaler 1-2 puff  1-2 puff Inhalation Q6H PRN Anetria Harwick T, MD      . alum & mag hydroxide-simeth (MAALOX/MYLANTA) 200-200-20 MG/5ML suspension 30 mL  30 mL Oral Q4H PRN Avish Torry T, MD      . lamoTRIgine (LAMICTAL) tablet 25 mg  25 mg Oral Daily Tawonna Esquer T, MD   25 mg at 09/26/17 1155  . LORazepam (ATIVAN) tablet 2 mg  2 mg Oral Q6H PRN Dayquan Buys, Jackquline Denmark, MD   2 mg at 09/25/17 2107   Or  . LORazepam (ATIVAN) injection 2 mg  2 mg Intramuscular Q6H PRN Tammey Deeg T, MD      . magnesium hydroxide (MILK OF MAGNESIA) suspension 30 mL  30 mL Oral Daily PRN Ovila Lepage T, MD      . OLANZapine zydis (ZYPREXA) disintegrating tablet 20 mg  20 mg Oral QHS Cailen Mihalik, Jackquline Denmark, MD   20 mg at 09/25/17 2106    Lab Results:  Results for orders placed or performed during the hospital encounter of 09/23/17 (from the past 48 hour(s))  Hemoglobin A1c     Status: None   Collection Time: 09/25/17  6:37 AM  Result Value Ref Range   Hgb A1c MFr Bld 5.2 4.8 - 5.6 %    Comment: (NOTE) Pre diabetes:          5.7%-6.4% Diabetes:              >6.4% Glycemic control for   <7.0% adults with diabetes    Mean Plasma Glucose 102.54 mg/dL    Comment: Performed at [facility], [address], [place], [place] [address]  TSH     Status: None   Collection Time: 09/25/17  6:37 AM  Result Value Ref Range   TSH 2.260 0.350 - 4.500 uIU/mL    Comment: Performed by a 3rd Generation assay with a functional sensitivity of <=0.01 uIU/mL. Performed at [facility], [address] [place], [place] [address]   Lipid panel     Status: None   Collection Time: 09/25/17  6:37 AM  Result Value Ref Range   Cholesterol 102 0 - 200 mg/dL   Triglycerides 43 <621 mg/dL   HDL 41 >30 mg/dL   Total CHOL/HDL Ratio 2.5 RATIO   VLDL 9 0 - 40 mg/dL   LDL Cholesterol 52 0 - 99 mg/dL    Comment:        Total Cholesterol/HDL:CHD Risk Coronary Heart Disease Risk Table  Men   Women  1/2 Average Risk   3.4   3.3  Average Risk       5.0   4.4  2 X Average Risk   9.6   7.1  3 X Average Risk  23.4   11.0        Use the calculated Patient Ratio above and the CHD Risk Table to determine the patient's CHD Risk.        ATP III CLASSIFICATION (LDL):  <100     mg/dL   Optimal  109-604  mg/dL   Near or Above                    Optimal  130-159  mg/dL   Borderline  540-981  mg/dL   High  >191     mg/dL   Very High Performed at [facility], [address] [place], [place] [address]     Blood Alcohol level:  Lab Results  Component Value Date   [facility] <10 09/21/2017   ETH <10 09/19/2017    Metabolic Disorder Labs: Lab Results  Component Value Date   HGBA1C 5.2 09/25/2017   MPG 102.54 09/25/2017   MPG 105.41 09/24/2017   Lab Results  Component Value Date   PROLACTIN 4.0 10/14/2015   Lab Results  Component Value Date   CHOL 102 09/25/2017   TRIG 43 09/25/2017   HDL 41 09/25/2017   CHOLHDL 2.5 09/25/2017   VLDL 9 09/25/2017   LDLCALC 52 09/25/2017   LDLCALC 58 09/24/2017    Physical Findings: AIMS: Facial and Oral Movements Muscles of Facial Expression: None, normal Lips and Perioral Area: None, normal Jaw: None, normal Tongue: None, normal,Extremity Movements Upper (arms, wrists, hands, fingers): None, normal Lower (legs, knees, ankles, toes): None, normal, Trunk Movements Neck, shoulders, hips: None, normal, Overall Severity Severity of abnormal movements (highest score from questions above): None, normal Incapacitation due to abnormal movements: None, normal Patient's awareness of abnormal movements (rate only patient's report): No Awareness, Dental Status Current problems with teeth and/or dentures?: No Does patient usually wear dentures?: No  CIWA:  CIWA-Ar Total: 0 COWS:  COWS Total Score: 2  Musculoskeletal: Strength & Muscle Tone: within normal limits Gait & Station: normal Patient leans:  N/A  Psychiatric Specialty Exam: Physical Exam  Nursing note and vitals reviewed. Constitutional: She appears well-developed and well-nourished.  HENT:  Head: Normocephalic and atraumatic.  Eyes: Pupils are equal, round, and reactive to light. Conjunctivae are normal.  Neck: Normal range of motion.  Cardiovascular: Regular rhythm and normal heart sounds.  Respiratory: Effort normal. No respiratory distress.  GI: Soft.  Musculoskeletal: Normal range of motion.  Neurological: She is alert.  Skin: Skin is warm and dry.  Psychiatric: Her affect is blunt and inappropriate. Her speech is not tangential. She is withdrawn. Thought content is paranoid. Cognition and memory are impaired. She expresses impulsivity and inappropriate judgment. She expresses no homicidal and no suicidal ideation. She is noncommunicative.    Review of Systems  Unable to perform ROS: Psychiatric disorder    Blood pressure 120/67, pulse 98, temperature (!) 97.4 F (36.3 C), temperature source Oral, resp. rate 18, height  (1.626 m), weight 81.6 kg (180 lb), last menstrual period 09/20/2017, SpO2 100 %.Body mass index is 30.9 kg/m.  General Appearance: Fairly Groomed  Eye Contact:  Fair  Speech:  Blocked  Volume:  Decreased  Mood:  Negative  Affect:  Inappropriate  Thought Process:  Disorganized and  Irrelevant  Orientation:  Full (Time, Place, and Person)  Thought Content:  Illogical and Paranoid Ideation  Suicidal Thoughts:  No  Homicidal Thoughts:  No  Memory:  Negative  Judgement:  Negative  Insight:  Negative  Psychomotor Activity:  Normal  Concentration:  Concentration: Poor  Recall:  Poor  Fund of Knowledge:  Negative  Language:  Negative  Akathisia:  Negative  Handed:  Right  AIMS (if indicated):     Assets:  Physical Health  ADL's:  Impaired  Cognition:  Impaired,  Mild  Sleep:  Number of Hours: 6     Treatment Plan Summary: Daily contact with patient to assess and evaluate symptoms  and progress in treatment, Medication management and Plan Patient is slightly improved in terms of actually speaking to me but she still has no insight and is hostile and irritable.  We did get a chance to talk briefly about drug use and she admitted to having been using methamphetamine although she minimized it and said she had not used it frequently.  Patient appears to be taking medicine.  Continue antipsychotics as well as individual and group monitoring and therapy.  Nursing aware of her tendency to try to elope and keeping a closer eye on her.  Mordecai Rasmussen, MD 09/26/2017, 4:06 PM

## 2017-09-26 NOTE — Progress Notes (Signed)
Patient ID: AZARIA STEGMAN, female   DOB: [birth year], 22 y.o.   MRN: [id number] Initially observed in her room sitting up in bed crying un consolably, "I just want to go home ..." Irritable more as I tried to provide re-assurance and comforting words, "that's bulls shit, they will never let me out of here .... " Patient was left alone.  Patient was later observed resting in bed peacefully when her mother called to check on her; she woke up for snacks with a different demeanor, pleasant, floridly psychotic, she was observed with neatly folded bed spread, towels and wash clothes; "I am taking it to the shop..." Redirected back to room; her room was exceptionally neat.  Medication time was a challenge as she just stood there and starring at the medications inside the cup and then looking at me with one eye half closed; it seemed for ever and finally did take the medications.

## 2017-09-26 NOTE — [facility] Group Notes (Signed)

## 2017-09-27 DIAGNOSIS — F209 Schizophrenia, unspecified: Secondary | ICD-10-CM

## 2017-09-27 MED ORDER — TRAZODONE HCL 50 MG PO TABS
50.0000 mg | ORAL_TABLET | Freq: Every day | ORAL | Status: DC
  Administered 2017-09-28: 50 mg via ORAL
  Filled 2017-09-27: qty 1

## 2017-09-27 NOTE — Plan of Care (Signed)
Patient is alert and oriented, denies SI, HI and AVH. 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 During assessment this morning patient just stared intensity and then began to laugh. Patient is very anxious and paces up and down the hall way. Patient is an elopement risk as well and instructions was given to patient to stay behind green line as well as staff notified. Patient appetite is good,  and denies pain. Patient hygiene improved, patient showered today. Nurse will continue to monitor. Problem: Education: Goal: Knowledge of [facility] Education information/materials will improve Outcome: Progressing Goal: Emotional status will improve Outcome: Not Progressing Goal: Mental status will improve Outcome: Not Progressing Goal: Verbalization of understanding the information provided will improve Outcome: Progressing   Problem: Activity: Goal: Interest or engagement in activities will improve Outcome: Not Progressing Goal: Sleeping patterns will improve Outcome: Not Progressing   Problem: Coping: Goal: Ability to verbalize frustrations and anger appropriately will improve Outcome: Progressing Goal: Ability to demonstrate self-control will improve Outcome: Progressing

## 2017-09-27 NOTE — Progress Notes (Signed)
Patient interacting in the courtyard with peers; playing basketball. Patient attended group with Child psychotherapist. Patient continues to stare and pace hall way. Nurse will continue to monitor. Safety maintained.

## 2017-09-27 NOTE — [facility] Group Notes (Signed)
LCSW Group Therapy Note  09/27/2017 1:15pm  Type of Therapy and Topic: Group Therapy: Holding on to Grudges   Participation Level: Active   Description of Group:  In this group patients will be asked to explore and define a grudge. Patients will be guided to discuss their thoughts, feelings, and reasons as to why people have grudges. Patients will process the impact grudges have on daily life and identify thoughts and feelings related to holding grudges. Facilitator will challenge patients to identify ways to let go of grudges and the benefits this provides. Patients will be confronted to address why one struggles letting go of grudges. Lastly, patients will identify feelings and thoughts related to what life would look like without grudges. This group will be process-oriented, with patients participating in exploration of their own experiences, giving and receiving support, and processing challenge from other group members.  Therapeutic Goals:  1. Patient will identify specific grudges related to their personal life.  2. Patient will identify feelings, thoughts, and beliefs around grudges.  3. Patient will identify how one releases grudges appropriately.  4. Patient will identify situations where they could have let go of the grudge, but instead chose to hold on.   Summary of Patient Progress: The patient reported she feels "good" today. Patient discussed her thoughts, feelings, and reasons as to why she has grudges. Patient was able to process the impact grudges have on daily life and identify thoughts and feelings related to holding grudges. The patient identified feelings and thoughts related to what life would look like without grudges. Patient fully participated in group discussion and was able to explore her own experiences, giving and receiving support, and processing challenge from other group members.   Therapeutic Modalities:  Cognitive Behavioral Therapy  Solution Focused Therapy   Motivational Interviewing  Brief Therapy   Theresa Dunwoody  CUEBAS-COLON, LCSW 09/27/2017 12:50 PM
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
## 2017-09-27 NOTE — Progress Notes (Signed)
[facility] MD Progress Note  09/27/2017 11:16 AM Theresa [name]  MRN:  [id number] Subjective:  " Im doing fine, when can I go home?" Pt in dayroom eating snacks. Denies AVH, denies SI/HI, unable to state reason of her hospitalization, poor insight. Pt reportedly  Had frequent awakenings at night.  Pt reported;y trieds to elope, conts to wander halls and stare at individuals through window, pt  isolative to self, laughs inappropriately.   .  Principal Problem: Schizophrenia (HCC) Diagnosis:   Patient Active Problem List   Diagnosis Date Noted  . Schizophrenia (HCC) [F20.9] 09/23/2017  . Bipolar affective disorder (HCC) [F31.9] 09/20/2017  . Bipolar affective disorder, mixed, severe, with psychotic behavior (HCC) [F31.64] 10/04/2016  . PCOS (polycystic ovarian syndrome) [E28.2] 01/18/2016  . Cannabis use disorder, moderate, dependence (HCC) [F12.20] 12/06/2015  . Tobacco use disorder [F17.200] 12/05/2015   Total Time spent with patient: 30 minutes  Past Psychiatric History: no ne winfo   Past Medical History:  Past Medical History:  Diagnosis Date  . Abnormal vaginal Pap smear   . Anxiety   . Bipolar disorder (HCC)   . Depression   . Migraine without aura   . Polycystic ovary syndrome   . Psychosis (HCC)   . Schizophrenia (HCC)   . STD (sexually transmitted disease) 10-14-15   chlamydia    History reviewed. No pertinent surgical history. Family History:  Family History  Problem Relation Age of Onset  . Hypertension Mother   . Heart disease Mother   . Cancer Mother   . Hypertension Other   . Depression Father   . Drug abuse Father   . Bipolar disorder Father   . Heart disease Maternal Grandmother   . Pancreatitis Maternal Grandfather   . Cancer Paternal Grandmother   . Cancer Paternal Grandfather    Family Psychiatric  History: no new info Social History:  Social History   Substance and Sexual Activity  Alcohol Use Yes  . Alcohol/week: 0.0 oz     Social History    Substance and Sexual Activity  Drug Use Yes  . Types: Marijuana   Comment: pt denies    Social History   Socioeconomic History  . Marital status: Single    Spouse name: Not on file  . Number of children: Not on file  . Years of education: Not on file  . Highest education level: Not on file  Occupational History  . Not on file  Social Needs  . Financial resource strain: Not on file  . Food insecurity:    Worry: Not on file    Inability: Not on file  . Transportation needs:    Medical: Not on file    Non-medical: Not on file  Tobacco Use  . Smoking status: Current Every Day Smoker    Packs/day: 0.50    Types: Cigarettes  . Smokeless tobacco: Never Used  Substance and Sexual Activity  . Alcohol use: Yes    Alcohol/week: 0.0 oz  . Drug use: Yes    Types: Marijuana    Comment: pt denies  . Sexual activity: Yes    Partners: Male    Birth control/protection: None  Lifestyle  . Physical activity:    Days per week: Not on file    Minutes per session: Not on file  . Stress: Not on file  Relationships  . Social connections:    Talks on phone: Not on file    Gets together: Not on file    Attends religious  service: Not on file    Active member of club or organization: Not on file    Attends meetings of clubs or organizations: Not on file    Relationship status: Not on file  Other Topics Concern  . Not on file  Social History Narrative  . Not on file   Additional Social History:                         Sleep: Poor  Appetite:  Fair  Current Medications: Current Facility-Administered Medications  Medication Dose Route Frequency Provider Last Rate Last Dose  . acetaminophen (TYLENOL) tablet 650 mg  650 mg Oral Q6H PRN Clapacs, John T, MD      . albuterol (PROVENTIL HFA;VENTOLIN HFA) 108 (90 Base) MCG/ACT inhaler 1-2 puff  1-2 puff Inhalation Q6H PRN Clapacs, John T, MD      . alum & mag hydroxide-simeth (MAALOX/MYLANTA) 200-200-20 MG/5ML suspension 30  mL  30 mL Oral Q4H PRN Clapacs, John T, MD      . lamoTRIgine (LAMICTAL) tablet 25 mg  25 mg Oral Daily Clapacs, Jackquline Denmark, MD   25 mg at 09/27/17 [phone]  . LORazepam (ATIVAN) tablet 2 mg  2 mg Oral Q6H PRN Clapacs, Jackquline Denmark, MD   2 mg at 09/26/17 2153   Or  . LORazepam (ATIVAN) injection 2 mg  2 mg Intramuscular Q6H PRN Clapacs, John T, MD      . magnesium hydroxide (MILK OF MAGNESIA) suspension 30 mL  30 mL Oral Daily PRN Clapacs, John T, MD      . OLANZapine zydis (ZYPREXA) disintegrating tablet 20 mg  20 mg Oral QHS Clapacs, John T, MD   20 mg at 09/26/17 2153    Lab Results: No results found for this or any previous visit (from the past 48 hour(s)).  Blood Alcohol level:  Lab Results  Component Value Date   ETH <10 09/21/2017   ETH <10 09/19/2017    Metabolic Disorder Labs: Lab Results  Component Value Date   HGBA1C 5.2 09/25/2017   MPG 102.54 09/25/2017   MPG 105.41 09/24/2017   Lab Results  Component Value Date   PROLACTIN 4.0 10/14/2015   Lab Results  Component Value Date   CHOL 102 09/25/2017   TRIG 43 09/25/2017   HDL 41 09/25/2017   CHOLHDL 2.5 09/25/2017   VLDL 9 09/25/2017   LDLCALC 52 09/25/2017   LDLCALC 58 09/24/2017    Physical Findings: AIMS: Facial and Oral Movements Muscles of Facial Expression: None, normal Lips and Perioral Area: None, normal Jaw: None, normal Tongue: None, normal,Extremity Movements Upper (arms, wrists, hands, fingers): None, normal Lower (legs, knees, ankles, toes): None, normal, Trunk Movements Neck, shoulders, hips: None, normal, Overall Severity Severity of abnormal movements (highest score from questions above): None, normal Incapacitation due to abnormal movements: None, normal Patient's awareness of abnormal movements (rate only patient's report): No Awareness, Dental Status Current problems with teeth and/or dentures?: No Does patient usually wear dentures?: No  CIWA:  CIWA-Ar Total: 0 COWS:  COWS Total Score:  2  Musculoskeletal: Strength & Muscle Tone: within normal limits Gait & Station: normal Patient leans:   Psychiatric Specialty Exam: Physical Exam  Nursing note and vitals reviewed.   ROS  Blood pressure 120/67, pulse 98, temperature (!) 97.4 F (36.3 C), temperature source Oral, resp. rate 18, height  (1.626 m), weight 81.6 kg (180 lb), last menstrual period 09/20/2017, SpO2 100 %.Body mass index  is 30.9 kg/m.  General Appearance: Fairly Groomed  Eye Contact:  stare at times  Speech:  Blocked  Volume:  Decreased  Mood:  Im fine"  Affect:  Inappropriate, labile  Thought Process:  Disorganized and Irrelevant  Orientation:  Full (Time, Place, and Person)  Thought Content:  Illogical and Paranoid Ideation, preoccupied with discharge  Suicidal Thoughts:  No  Homicidal Thoughts:  No  Memory:  Negative  Judgement:  Negative  Insight:  Negative  Psychomotor Activity:  Normal  Concentration:  Concentration: Poor  Recall:  Poor  Fund of Knowledge:  Negative  Language:  Negative  Akathisia:  Negative  Handed:  Right  AIMS (if indicated):     Assets:  Physical Health  ADL's:  Impaired  Cognition:  poor concentration  Sleep:  Number of Hours: 7.3          Treatment Plan Summary: Daily contact with patient to assess and evaluate symptoms and progress in treatment and Medication management. Pt continue to be psychotic, high risk for elopement, poor insight. Cont zyprexa and lamictal. Add Trazodone for sleep. EKG pending.  Nursing aware of elopement risk.    Beverly Sessions, MD 09/27/2017, 11:16 AM

## 2017-09-28 ENCOUNTER — Encounter: Payer: Self-pay | Admitting: Psychiatry

## 2017-09-28 DIAGNOSIS — N390 Urinary tract infection, site not specified: Secondary | ICD-10-CM | POA: Diagnosis present

## 2017-09-28 MED ORDER — FOSFOMYCIN TROMETHAMINE 3 G PO PACK
3.0000 g | PACK | Freq: Once | ORAL | Status: AC
  Administered 2017-09-28: 3 g via ORAL
  Filled 2017-09-28: qty 3

## 2017-09-28 MED ORDER — OLANZAPINE 5 MG PO TBDP
10.0000 mg | ORAL_TABLET | Freq: Two times a day (BID) | ORAL | Status: DC
  Administered 2017-09-28 – 2017-09-29 (×2): 10 mg via ORAL
  Filled 2017-09-28 (×2): qty 2

## 2017-09-28 MED ORDER — NICOTINE 21 MG/24HR TD PT24: 21.0000 mg | MEDICATED_PATCH | Freq: Every day | TRANSDERMAL | Status: DC

## 2017-09-28 NOTE — Progress Notes (Signed)
Theresa [name]  09/28/2017 11:49 AM Theresa [name]  MRN:  [id number] Subjective:    Pt refused hs zyprexa and am lamictal. Pt in dayroom, continue to be disorganized, staring  and smiling inappropriately, unable to give reason for refusing medicine.  .  Principal Problem: Schizophrenia (HCC) Diagnosis:   Patient Active Problem List   Diagnosis Date Noted  . Schizophrenia (HCC) [F20.9] 09/23/2017  . Bipolar affective disorder (HCC) [F31.9] 09/20/2017  . Bipolar affective disorder, mixed, severe, with psychotic behavior (HCC) [F31.64] 10/04/2016  . PCOS (polycystic ovarian syndrome) [E28.2] 01/18/2016  . Cannabis use disorder, moderate, dependence (HCC) [F12.20] 12/06/2015  . Tobacco use disorder [F17.200] 12/05/2015   Total Time spent with patient: 30 minutes  Past Psychiatric History: no ne winfo   Past Medical History:  Past Medical History:  Diagnosis Date  . Abnormal vaginal Pap smear   . Anxiety   . Bipolar disorder (HCC)   . Depression   . Migraine without aura   . Polycystic ovary syndrome   . Psychosis (HCC)   . Schizophrenia (HCC)   . STD (sexually transmitted disease) 10-14-15   chlamydia    History reviewed. No pertinent surgical history. Family History:  Family History  Problem Relation Age of Onset  . Hypertension Mother   . Heart disease Mother   . Cancer Mother   . Hypertension Other   . Depression Father   . Drug abuse Father   . Bipolar disorder Father   . Heart disease Maternal Grandmother   . Pancreatitis Maternal Grandfather   . Cancer Paternal Grandmother   . Cancer Paternal Grandfather    Family Psychiatric  History: no new info Social History:  Social History   Substance and Sexual Activity  Alcohol Use Yes  . Alcohol/week: 0.0 oz     Social History   Substance and Sexual Activity  Drug Use Yes  . Types: Marijuana   Comment: pt denies    Social History   Socioeconomic History  . Marital status: Single    Spouse name:  Not on file  . Number of children: Not on file  . Years of education: Not on file  . Highest education level: Not on file  Occupational History  . Not on file  Social Needs  . Financial resource strain: Not on file  . Food insecurity:    Worry: Not on file    Inability: Not on file  . Transportation needs:    Medical: Not on file    Non-medical: Not on file  Tobacco Use  . Smoking status: Current Every Day Smoker    Packs/day: 0.50    Types: Cigarettes  . Smokeless tobacco: Never Used  Substance and Sexual Activity  . Alcohol use: Yes    Alcohol/week: 0.0 oz  . Drug use: Yes    Types: Marijuana    Comment: pt denies  . Sexual activity: Yes    Partners: Male    Birth control/protection: None  Lifestyle  . Physical activity:    Days per week: Not on file    Minutes per session: Not on file  . Stress: Not on file  Relationships  . Social connections:    Talks on phone: Not on file    Gets together: Not on file    Attends religious service: Not on file    Active member of club or organization: Not on file    Attends meetings of clubs or organizations: Not on file  Relationship status: Not on file  Other Topics Concern  . Not on file  Social History Narrative  . Not on file   Additional Social History:                         Sleep: Poor  Appetite:  Fair  Current Medications: Current Facility-Administered Medications  Medication Dose Route Frequency Provider Last Rate Last Dose  . acetaminophen (TYLENOL) tablet 650 mg  650 mg Oral Q6H PRN Clapacs, John T, MD      . albuterol (PROVENTIL HFA;VENTOLIN HFA) 108 (90 Base) MCG/ACT inhaler 1-2 puff  1-2 puff Inhalation Q6H PRN Clapacs, John T, MD      . alum & mag hydroxide-simeth (MAALOX/MYLANTA) 200-200-20 MG/5ML suspension 30 mL  30 mL Oral Q4H PRN Clapacs, John T, MD      . lamoTRIgine (LAMICTAL) tablet 25 mg  25 mg Oral Daily Clapacs, Jackquline Denmark, MD   25 mg at 09/27/17 [phone]  . LORazepam (ATIVAN) tablet 2  mg  2 mg Oral Q6H PRN Clapacs, Jackquline Denmark, MD   2 mg at 09/26/17 2153   Or  . LORazepam (ATIVAN) injection 2 mg  2 mg Intramuscular Q6H PRN Clapacs, John T, MD      . magnesium hydroxide (MILK OF MAGNESIA) suspension 30 mL  30 mL Oral Daily PRN Clapacs, John T, MD      . OLANZapine zydis (ZYPREXA) disintegrating tablet 10 mg  10 mg Oral BID Beverly Sessions, MD      . traZODone (DESYREL) tablet 50 mg  50 mg Oral QHS Beverly Sessions, MD        Lab Results: No results found for this or any previous visit (from the past 48 hour(s)).  Blood Alcohol level:  Lab Results  Component Value Date   ETH <10 09/21/2017   ETH <10 09/19/2017    Metabolic Disorder Labs: Lab Results  Component Value Date   HGBA1C 5.2 09/25/2017   MPG 102.54 09/25/2017   MPG 105.41 09/24/2017   Lab Results  Component Value Date   PROLACTIN 4.0 10/14/2015   Lab Results  Component Value Date   CHOL 102 09/25/2017   TRIG 43 09/25/2017   HDL 41 09/25/2017   CHOLHDL 2.5 09/25/2017   VLDL 9 09/25/2017   LDLCALC 52 09/25/2017   LDLCALC 58 09/24/2017    Physical Findings: AIMS: Facial and Oral Movements Muscles of Facial Expression: None, normal Lips and Perioral Area: None, normal Jaw: None, normal Tongue: None, normal,Extremity Movements Upper (arms, wrists, hands, fingers): None, normal Lower (legs, knees, ankles, toes): None, normal, Trunk Movements Neck, shoulders, hips: None, normal, Overall Severity Severity of abnormal movements (highest score from questions above): None, normal Incapacitation due to abnormal movements: None, normal Patient's awareness of abnormal movements (rate only patient's report): No Awareness, Dental Status Current problems with teeth and/or dentures?: No Does patient usually wear dentures?: No  CIWA:  CIWA-Ar Total: 0 COWS:  COWS Total Score: 2  Musculoskeletal: Strength & Muscle Tone: within normal limits Gait & Station: normal Patient leans:   Psychiatric Specialty  Exam: Physical Exam  Nursing [name] and vitals reviewed.   ROS  Blood pressure 110/81, pulse 83, temperature 98 F (36.7 C), temperature source Oral, resp. rate 16, height  (1.626 m), weight 81.6 kg (180 lb), last menstrual period 09/20/2017, SpO2 100 %.Body mass index is 30.9 kg/m.  General Appearance: Fairly Groomed  Eye Contact:  stare at times  Speech:  Blocked  Volume:  Decreased  Mood:  ok  Affect:  Inappropriate,  Smiling to self  Thought Process:  Disorganized and Irrelevant  Orientation:  Full (Time, Place, and Person)  Thought Content:  Illogical and Paranoid Ideation, guarded  Suicidal Thoughts:  No  Homicidal Thoughts:  No  Memory:  Negative  Judgement:  Negative  Insight:  Negative  Psychomotor Activity:  Normal  Concentration:  Concentration: Poor  Recall:  Poor  Fund of Knowledge:  Negative  Language:  Negative  Akathisia:  Negative  Handed:  Right  AIMS (if indicated):     Assets:  Physical Health  ADL's:  Impaired  Cognition:  poor concentration  Sleep:  Number of Hours: 7.3          Treatment Plan Summary: Daily contact with patient to assess and evaluate symptoms and progress in treatment and Medication management. Pt continue to be psychotic, disorganized,  high risk for elopement, poor insight, refusing meds. Split hs  zyprexa as bid to see if that helps with compliance. cont lamictal. May need FMP.  Nursing aware of elopement risk.    Beverly Sessions, MD 09/28/2017, 11:49 AMPatient ID: Hart Robinsons, female   DOB: 06/27/1995, 21 y.o.   MRN: [id number]

## 2017-09-28 NOTE — [facility] Group Notes (Signed)
LCSW Group Therapy Note 09/28/2017 1:15pm  Type of Therapy and Topic: Group Therapy: Feelings Around Returning Home & Establishing a Supportive Framework and Supporting Oneself When Supports Not Available  Participation Level: Active  Description of Group:  Patients first processed thoughts and feelings about upcoming discharge. These included fears of upcoming changes, lack of change, new living environments, judgements and expectations from others and overall stigma of mental health issues. The group then discussed the definition of a supportive framework, what that looks and feels like, and how do to discern it from an unhealthy non-supportive network. The group identified different types of supports as well as what to do when your family/friends are less than helpful or unavailable  Therapeutic Goals  1. Patient will identify one healthy supportive network that they can use at discharge. 2. Patient will identify one factor of a supportive framework and how to tell it from an unhealthy network. 3. Patient able to identify one coping skill to use when they do not have positive supports from others. 4. Patient will demonstrate ability to communicate their needs through discussion and/or role plays.  Summary of Patient Progress:  The patient scored her mood at a 10 (10 best.) Pt engaged during group session. As patients processed their anxiety about discharge and described healthy supports patient shared she is ready to go home. Pt stated "I really don't have a support system but I am done being here." Patients identified at least one self-care tool they were willing to use after discharge.   Therapeutic Modalities Cognitive Behavioral Therapy Motivational Interviewing   Milisa Kimbell  CUEBAS-COLON, LCSW 09/28/2017 12:57 PM

## 2017-09-28 NOTE — Progress Notes (Signed)
Patient is alert and oriented x 4, denies  SI/HI/AVH , no distress noted, affect is flat but brightens upon approach,.Patient's thoughts are disorganized,speech is tange non pressured and she has episodes of delayed responses and selective mutism during a conversation.  Patient was given support and encouragement, medication was given ,attended evening wrap up group will continue to monitor.

## 2017-09-28 NOTE — Progress Notes (Signed)
Patient received medication education on Zyprexa. Patient refused to take medication. Nurse informed patient medication takes at least two weeks to get into the body system. Patient states, "No Im not taking it." Nurse will continue to monitor.

## 2017-09-28 NOTE — Plan of Care (Signed)
Patient is alert and oriented; continues to have select mutism. Patient denies SI, HI and AVH. Patient refused medication this morning without given reason to nurse instead patient only smiled at nurse and did not answer any more questions. Patient paces the unit, and has selected staff the patient will speak with. Patient continues to be guarded and suspicious. Nurse will continue to monitor patient. Problem: Education: Goal: Knowledge of [facility] Education information/materials will improve Outcome: Not Progressing Goal: Emotional status will improve Outcome: Not Progressing Goal: Mental status will improve Outcome: Not Progressing Goal: Verbalization of understanding the information provided will improve Outcome: Not Progressing   Problem: Activity: Goal: Interest or engagement in activities will improve Outcome: Not Progressing Goal: Sleeping patterns will improve Outcome: Progressing   Problem: Coping: Goal: Ability to verbalize frustrations and anger appropriately will improve Outcome: Not Progressing Goal: Ability to demonstrate self-control will improve Outcome: Not Progressing

## 2017-09-29 ENCOUNTER — Other Ambulatory Visit: Payer: Self-pay

## 2017-09-29 MED ORDER — TRAZODONE HCL 100 MG PO TABS
100.0000 mg | ORAL_TABLET | Freq: Every day | ORAL | Status: DC
  Administered 2017-09-29 – 2017-10-01 (×3): 100 mg via ORAL
  Filled 2017-09-29 (×3): qty 1

## 2017-09-29 MED ORDER — OLANZAPINE 5 MG PO TBDP
15.0000 mg | ORAL_TABLET | Freq: Two times a day (BID) | ORAL | Status: DC
  Administered 2017-09-29 – 2017-09-30 (×3): 15 mg via ORAL
  Filled 2017-09-29 (×3): qty 3
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
## 2017-09-29 NOTE — Tx Team (Signed)
Interdisciplinary Treatment and Diagnostic Plan Update  09/29/2017 Time of Session: 10:30am Theresa [name] MRN: [id number]  Principal Diagnosis: Undifferentiated schizophrenia [facility])  Secondary Diagnoses: Principal Problem:   Undifferentiated schizophrenia (HCC) Active Problems:   Tobacco use disorder   Cannabis use disorder, moderate, dependence (HCC)   PCOS (polycystic ovarian syndrome)   UTI (urinary tract infection)   Current Medications:  Current Facility-Administered Medications  Medication Dose Route Frequency Provider Last Rate Last Dose  . acetaminophen (TYLENOL) tablet 650 mg  650 mg Oral Q6H PRN Clapacs, John T, MD      . alum & mag hydroxide-simeth (MAALOX/MYLANTA) 200-200-20 MG/5ML suspension 30 mL  30 mL Oral Q4H PRN Clapacs, John T, MD      . lamoTRIgine (LAMICTAL) tablet 25 mg  25 mg Oral Daily Clapacs, Jackquline Denmark, MD   25 mg at 09/29/17 0801  . LORazepam (ATIVAN) tablet 2 mg  2 mg Oral Q6H PRN Clapacs, Jackquline Denmark, MD   2 mg at 09/28/17 2151  . magnesium hydroxide (MILK OF MAGNESIA) suspension 30 mL  30 mL Oral Daily PRN Clapacs, John T, MD      . OLANZapine zydis (ZYPREXA) disintegrating tablet 10 mg  10 mg Oral BID Beverly Sessions, MD   10 mg at 09/29/17 0800  . traZODone (DESYREL) tablet 50 mg  50 mg Oral QHS Beverly Sessions, MD   50 mg at 09/28/17 2151   PTA Medications: Medications Prior to Admission  Medication Sig Dispense Refill Last Dose  . albuterol (PROVENTIL HFA;VENTOLIN HFA) 108 (90 Base) MCG/ACT inhaler Inhale 1-2 puffs into the lungs every 6 (six) hours as needed for wheezing or shortness of breath. 1 Inhaler 0 unknown    Patient Stressors: Financial difficulties Medication change or noncompliance Substance abuse  Patient Strengths: Capable of independent living Barrister's clerk for treatment/growth Physical Health  Treatment Modalities: Medication Management, Group therapy, Case management,  1 to 1 session with clinician,  Psychoeducation, Recreational therapy.   Physician Treatment Plan for Primary Diagnosis: Undifferentiated schizophrenia (HCC) Long Term Goal(s): Improvement in symptoms so as ready for discharge Improvement in symptoms so as ready for discharge   Short Term Goals: Ability to demonstrate self-control will improve Compliance with prescribed medications will improve Ability to identify and develop effective coping behaviors will improve Ability to maintain clinical measurements within normal limits will improve Compliance with prescribed medications will improve  Medication Management: Evaluate patient's response, side effects, and tolerance of medication regimen.  Therapeutic Interventions: 1 to 1 sessions, Unit Group sessions and Medication administration.  Evaluation of Outcomes: Progressing  Physician Treatment Plan for Secondary Diagnosis: Principal Problem:   Undifferentiated schizophrenia (HCC) Active Problems:   Tobacco use disorder   Cannabis use disorder, moderate, dependence (HCC)   PCOS (polycystic ovarian syndrome)   UTI (urinary tract infection)  Long Term Goal(s): Improvement in symptoms so as ready for discharge Improvement in symptoms so as ready for discharge   Short Term Goals: Ability to demonstrate self-control will improve Compliance with prescribed medications will improve Ability to identify and develop effective coping behaviors will improve Ability to maintain clinical measurements within normal limits will improve Compliance with prescribed medications will improve     Medication Management: Evaluate patient's response, side effects, and tolerance of medication regimen.  Therapeutic Interventions: 1 to 1 sessions, Unit Group sessions and Medication administration.  Evaluation of Outcomes: Progressing   RN Treatment Plan for Primary Diagnosis: Undifferentiated schizophrenia (HCC) Long Term Goal(s): Knowledge of disease and therapeutic regimen to  maintain health will improve  Short Term Goals: Ability to verbalize feelings will improve, Ability to identify and develop effective coping behaviors will improve and Compliance with prescribed medications will improve  Medication Management: RN will administer medications as ordered by provider, will assess and evaluate patient's response and provide education to patient for prescribed medication. RN will report any adverse and/or side effects to prescribing provider.  Therapeutic Interventions: 1 on 1 counseling sessions, Psychoeducation, Medication administration, Evaluate responses to treatment, Monitor vital signs and CBGs as ordered, Perform/monitor CIWA, COWS, AIMS and Fall Risk screenings as ordered, Perform wound care treatments as ordered.  Evaluation of Outcomes: Progressing   LCSW Treatment Plan for Primary Diagnosis: Undifferentiated schizophrenia (HCC) Long Term Goal(s): Safe transition to appropriate next level of care at discharge, Engage patient in therapeutic group addressing interpersonal concerns.  Short Term Goals: Engage patient in aftercare planning with referrals and resources, Increase social support, Increase ability to appropriately verbalize feelings, Facilitate patient progression through stages of change regarding substance use diagnoses and concerns, Identify triggers associated with mental health/substance abuse issues and Increase skills for wellness and recovery  Therapeutic Interventions: Assess for all discharge needs, 1 to 1 time with Social worker, Explore available resources and support systems, Assess for adequacy in community support network, Educate family and significant other(s) on suicide prevention, Complete Psychosocial Assessment, Interpersonal group therapy.  Evaluation of Outcomes: Progressing   Progress in Treatment: Attending groups: Yes. Participating in groups: Yes. Taking medication as prescribed: Yes. Toleration medication:  Yes. Family/Significant other contact made: Yes, individual(s) contacted:  [name]), mother, [phone] Patient understands diagnosis: Yes. Discussing patient identified problems/goals with staff: Yes. Medical problems stabilized or resolved: Yes. Denies suicidal/homicidal ideation: Yes. Issues/concerns per patient self-inventory: No. Other:   New problem(s) identified: No, Describe:  None  New Short Term/Long Term Goal(s): N/A  Discharge Plan or Barriers: To return home with mother and follow up with outpatient services at [facility] in [place], [place].  Reason for Continuation of Hospitalization: Depression Medication stabilization  Estimated Length of Stay: 7 days  Recreational Therapy: Patient Stressors: N/A  Patient Goal: Patient will engage in groups without prompting or encouragement from LRT x3 group sessions within 5 recreation therapy group sessions  Attendees: Patient:  09/29/2017 10:56 AM  Physician: Dr. Jennet Maduro MD 09/29/2017 10:56 AM  Nursing: Leonia Reader RN 09/29/2017 10:56 AM  RN Care Manager: 09/29/2017 10:56 AM  Social Worker: Johny Shears, LCSWA 09/29/2017 10:56 AM  Recreational Therapist: Danella Deis. Dreama Saa, LRT 09/29/2017 10:56 AM  Other: Heidi Dach LCSW 09/29/2017 10:56 AM  Other: Jake Shark, LCSW 09/29/2017 10:56 AM  Other: 09/29/2017 10:56 AM    Scribe for Treatment Team: Johny Shears, LCSW 09/29/2017 10:56 AM

## 2017-09-29 NOTE — Plan of Care (Signed)
Patients affect was bright this morning.Afternoon started crying asking writer "when do I get out from here."Isolated in the room.Attended groups.Compliant with medications.Support and encouragement given.

## 2017-09-29 NOTE — Progress Notes (Addendum)
[facility] MD Progress Note  09/29/2017 9:50 AM Theresa [name]  MRN:  [id number]  Subjective:   Theresa [name] reports feeling better. She refused medications over the weekend but took them last night and this morning. She "likes" Lamictal but believes that it gives her "puffy face". She recognizes me from previous admission and is very friendly. She denies any symptoms of depression, anxiety ort psychosis but presents with disorganized thinking and giddy/silly affect. She has no idea why she is in the hospital.  Spoke with the mother who visited las night and noticed slight improvement. Patient was catatonic before admission. She would not eat, drink or urinate. She was sleepless. She had menstrual period for 18 days prior to admission, refused to use tampons and was bleeding "all over the place". Mother applied for disability. The patient has not been compliant with medications in part due to high cost.  Principal Problem: Undifferentiated schizophrenia (HCC) Diagnosis:   Patient Active Problem List   Diagnosis Date Noted  . Undifferentiated schizophrenia (HCC) [F20.3] 09/23/2017    Priority: High  . UTI (urinary tract infection) [N39.0] 09/28/2017  . Bipolar affective disorder (HCC) [F31.9] 09/20/2017  . Bipolar affective disorder, mixed, severe, with psychotic behavior (HCC) [F31.64] 10/04/2016  . PCOS (polycystic ovarian syndrome) [E28.2] 01/18/2016  . Cannabis use disorder, moderate, dependence (HCC) [F12.20] 12/06/2015  . Tobacco use disorder [F17.200] 12/05/2015   Total Time spent with patient: 30 minutes  Past Psychiatric History: psychosis.  Past Medical History:  Past Medical History:  Diagnosis Date  . Abnormal vaginal Pap smear   . Anxiety   . Bipolar disorder (HCC)   . Depression   . Migraine without aura   . Polycystic ovary syndrome   . Psychosis (HCC)   . Schizophrenia (HCC)   . STD (sexually transmitted disease) 10-14-15   chlamydia    History reviewed. No pertinent  surgical history. Family History:  Family History  Problem Relation Age of Onset  . Hypertension Mother   . Heart disease Mother   . Cancer Mother   . Hypertension Other   . Depression Father   . Drug abuse Father   . Bipolar disorder Father   . Heart disease Maternal Grandmother   . Pancreatitis Maternal Grandfather   . Cancer Paternal Grandmother   . Cancer Paternal Grandfather    Family Psychiatric  History: multiple family members with mental illness Social History:  Social History   Substance and Sexual Activity  Alcohol Use Yes  . Alcohol/week: 0.0 oz     Social History   Substance and Sexual Activity  Drug Use Yes  . Types: Marijuana   Comment: pt denies    Social History   Socioeconomic History  . Marital status: Single    Spouse name: Not on file  . Number of children: Not on file  . Years of education: Not on file  . Highest education level: Not on file  Occupational History  . Not on file  Social Needs  . Financial resource strain: Not on file  . Food insecurity:    Worry: Not on file    Inability: Not on file  . Transportation needs:    Medical: Not on file    Non-medical: Not on file  Tobacco Use  . Smoking status: Current Every Day Smoker    Packs/day: 0.50    Types: Cigarettes  . Smokeless tobacco: Never Used  Substance and Sexual Activity  . Alcohol use: Yes    Alcohol/week: 0.0 oz  .  Drug use: Yes    Types: Marijuana    Comment: pt denies  . Sexual activity: Yes    Partners: Male    Birth control/protection: None  Lifestyle  . Physical activity:    Days per week: Not on file    Minutes per session: Not on file  . Stress: Not on file  Relationships  . Social connections:    Talks on phone: Not on file    Gets together: Not on file    Attends religious service: Not on file    Active member of club or organization: Not on file    Attends meetings of clubs or organizations: Not on file    Relationship status: Not on file  Other  Topics Concern  . Not on file  Social History Narrative  . Not on file   Additional Social History:                         Sleep: Good  Appetite:  Fair  Current Medications: Current Facility-Administered Medications  Medication Dose Route Frequency Provider Last Rate Last Dose  . acetaminophen (TYLENOL) tablet 650 mg  650 mg Oral Q6H PRN Clapacs, John T, MD      . alum & mag hydroxide-simeth (MAALOX/MYLANTA) 200-200-20 MG/5ML suspension 30 mL  30 mL Oral Q4H PRN Clapacs, John T, MD      . lamoTRIgine (LAMICTAL) tablet 25 mg  25 mg Oral Daily Clapacs, Jackquline Denmark, MD   25 mg at 09/29/17 0801  . LORazepam (ATIVAN) tablet 2 mg  2 mg Oral Q6H PRN Clapacs, Jackquline Denmark, MD   2 mg at 09/28/17 2151  . magnesium hydroxide (MILK OF MAGNESIA) suspension 30 mL  30 mL Oral Daily PRN Clapacs, John T, MD      . OLANZapine zydis (ZYPREXA) disintegrating tablet 10 mg  10 mg Oral BID Beverly Sessions, MD   10 mg at 09/29/17 0800  . traZODone (DESYREL) tablet 50 mg  50 mg Oral QHS Beverly Sessions, MD   50 mg at 09/28/17 2151    Lab Results: No results found for this or any previous visit (from the past 48 hour(s)).  Blood Alcohol level:  Lab Results  Component Value Date   ETH <10 09/21/2017   ETH <10 09/19/2017    Metabolic Disorder Labs: Lab Results  Component Value Date   HGBA1C 5.2 09/25/2017   MPG 102.54 09/25/2017   MPG 105.41 09/24/2017   Lab Results  Component Value Date   PROLACTIN 4.0 10/14/2015   Lab Results  Component Value Date   CHOL 102 09/25/2017   TRIG 43 09/25/2017   HDL 41 09/25/2017   CHOLHDL 2.5 09/25/2017   VLDL 9 09/25/2017   LDLCALC 52 09/25/2017   LDLCALC 58 09/24/2017    Physical Findings: AIMS: Facial and Oral Movements Muscles of Facial Expression: None, normal Lips and Perioral Area: None, normal Jaw: None, normal Tongue: None, normal,Extremity Movements Upper (arms, wrists, hands, fingers): None, normal Lower (legs, knees, ankles, toes):  None, normal, Trunk Movements Neck, shoulders, hips: None, normal, Overall Severity Severity of abnormal movements (highest score from questions above): None, normal Incapacitation due to abnormal movements: None, normal Patient's awareness of abnormal movements (rate only patient's report): No Awareness, Dental Status Current problems with teeth and/or dentures?: No Does patient usually wear dentures?: No  CIWA:  CIWA-Ar Total: 0 COWS:  COWS Total Score: 2  Musculoskeletal: Strength & Muscle Tone: within normal limits Gait &  Station: normal Patient leans: N/A  Psychiatric Specialty Exam: Physical Exam  Nursing note and vitals reviewed. Psychiatric: Her speech is normal. Her affect is inappropriate. She is withdrawn. Thought content is delusional. Cognition and memory are impaired. She expresses inappropriate judgment. She exhibits abnormal recent memory and abnormal remote memory.    Review of Systems  Neurological: Negative.   Psychiatric/Behavioral: Negative.   All other systems reviewed and are negative.   Blood pressure 120/77, pulse (!) 106, temperature 97.6 F (36.4 C), temperature source Oral, resp. rate 17, height  (1.626 m), weight 81.6 kg (180 lb), last menstrual period 09/20/2017, SpO2 100 %.Body mass index is 30.9 kg/m.  General Appearance: Casual  Eye Contact:  Good  Speech:  Clear and Coherent  Volume:  Normal  Mood:  Euphoric  Affect:  Congruent  Thought Process:  Disorganized, Irrelevant and Descriptions of Associations: Loose  Orientation:  Other:  person and place only, thinks it is April 1  Thought Content:  Illogical  Suicidal Thoughts:  No  Homicidal Thoughts:  No  Memory:  Immediate;   Poor Recent;   Poor Remote;   Poor  Judgement:  Poor  Insight:  Lacking  Psychomotor Activity:  Normal  Concentration:  Concentration: Poor and Attention Span: Poor  Recall:  Poor  Fund of Knowledge:  Fair  Language:  Fair  Akathisia:  No  Handed:  Right   AIMS (if indicated):     Assets:  Communication Skills Desire for Improvement Housing Physical Health Resilience Social Support  ADL's:  Intact  Cognition:  Impaired,  Mild  Sleep:  Number of Hours: 8.5     Treatment Plan Summary: Daily contact with patient to assess and evaluate symptoms and progress in treatment and Medication management   Theresa [name] is a 22 year old female with a history of psychosis admitted for another psychotic break in the context of treatment noncomplinace.  #Psychosis -continue Zyprexa 10 mg BID -continue Lamictal 25 mg daily  #Insomnia -Trazodone 50 mg nightly   #UTI -single dose of Fosfomycin given  #Cannabis abuse -patient minimizes problems and declines treatment  #Smoking cessation -refuses nicotine patch   #Labs, metabolic syndrome monitoring -Lipid panel, HgbA1C and TSH are all normal -pregnancy test is negative -EKG, pending  #Admission status -IVC  #Disposition -discharge to home with family -follow up with   Kristine Linea, MD 09/29/2017, 9:50 AM

## 2017-09-29 NOTE — [facility] Group Notes (Signed)
[facility] Group Notes:  (Nursing/MHT/Case Management/Adjunct)  Date:  09/29/2017  Time:  9:00 PM  Type of Therapy:  Group Therapy  Participation Level:  Active  Participation Quality:  Standing in the hall.  Affect:  Appropriate  Cognitive:  Appropriate  Insight:  Appropriate  Engagement in Group:  Engaged  Modes of Intervention:  Support  Summary of Progress/Problems:  Mayra Neer 09/29/2017, 9:00 PM

## 2017-09-29 NOTE — Progress Notes (Signed)
Patient is alert and oriented x 4, denies  SI/HI/AVH , no distress noted, affect is bright upon approach,.Patient's thoughts are less disorganized, speech is coherent, with  delayed response noted during conservation. .  Patient was given support and encouragement, patient complaint with medication, attended evening wrap up group will continue to monitor.

## 2017-09-29 NOTE — Progress Notes (Signed)
Recreation Therapy Notes  Date: 09/29/2017  Time: 9:30 am   Location: Craft Room   Behavioral response: N/A   Intervention Topic: Problem Solving  Discussion/Intervention: Patient did not attend group.   Clinical Observations/Feedback:  Patient did not attend group.   Jodee Wagenaar LRT/CTRS         Bertine Schlottman 09/29/2017 10:22 AM 

## 2017-09-30 MED ORDER — OLANZAPINE 5 MG PO TBDP: 30.0000 mg | ORAL_TABLET | Freq: Every day | ORAL | Status: DC

## 2017-09-30 MED ORDER — LAMOTRIGINE 25 MG PO TABS: 25.0000 mg | ORAL_TABLET | Freq: Every day | ORAL | Status: DC

## 2017-09-30 MED ORDER — LORAZEPAM 0.5 MG PO TABS: 0.5000 mg | ORAL_TABLET | Freq: Four times a day (QID) | ORAL | Status: DC | PRN

## 2017-09-30 NOTE — [facility] Group Notes (Signed)
[name] Group Therapy Note  09/30/2017  Time:  0900  Type of Therapy and Topic: Group Therapy: Goals Group: SMART Goals    Participation Level:  Active    Description of Group:   The purpose of a daily goals group is to assist and guide patients in setting recovery/wellness-related goals. The objective is to set goals as they relate to the crisis in which they were admitted. Patients will be using SMART goal modalities to set measurable goals. Characteristics of realistic goals will be discussed and patients will be assisted in setting and processing how one will reach their goal. Facilitator will also assist patients in applying interventions and coping skills learned in psycho-education groups to the SMART goal and process how one will achieve defined goal.    Therapeutic Goals:  -Patients will develop and document one goal related to or their crisis in which brought them into treatment.  -Patients will be guided by LCSW using SMART goal setting modality in how to set a measurable, attainable, realistic and time sensitive goal.  -Patients will process barriers in reaching goal.  -Patients will process interventions in how to overcome and successful in reaching goal.    Patient's Goal:  Pt continues to work towards their tx goals but has not yet reached them. Pt was able to appropriately participate in group discussion, and was able to offer support/validation to other group members. Pt reported her goal for the day is, "to be discharged by talking to the doctor at least one time by the end of today."   Therapeutic Modalities:  Motivational Interviewing  Cognitive Behavioral Therapy  Crisis Intervention Model  SMART goals setting  Heidi Dach, MSW, LCSW Clinical Social Worker 09/30/2017 9:56 AM

## 2017-09-30 NOTE — Progress Notes (Addendum)
[facility] MD Progress Note  09/30/2017 1:09 PM Theresa [name]  MRN:  [id number]  Subjective:    Theresa [name] is very disorganized, paranoid and labile. She is troubled by excessive stimulation on the unit and very, very frightened. I had to hold her hand and constantly reassure her during EKG. She was actually asking for her mother. She ate lunch today but then started crying for no apparent reason. She goes to groups and participates quite well when better. She takes medications and reports no side effects.   The mother believes that the patient should be on Ativan for ":catatonia". Outpatient provider refuses to prescribe.  Principal Problem: Undifferentiated schizophrenia (HCC) Diagnosis:   Patient Active Problem List   Diagnosis Date Noted  . Undifferentiated schizophrenia (HCC) [F20.3] 09/23/2017    Priority: High  . UTI (urinary tract infection) [N39.0] 09/28/2017  . Bipolar affective disorder (HCC) [F31.9] 09/20/2017  . Bipolar affective disorder, mixed, severe, with psychotic behavior (HCC) [F31.64] 10/04/2016  . PCOS (polycystic ovarian syndrome) [E28.2] 01/18/2016  . Cannabis use disorder, moderate, dependence (HCC) [F12.20] 12/06/2015  . Tobacco use disorder [F17.200] 12/05/2015   Total Time spent with patient: 20 minutes  Past Psychiatric History: schizophrenia  Past Medical History:  Past Medical History:  Diagnosis Date  . Abnormal vaginal Pap smear   . Anxiety   . Bipolar disorder (HCC)   . Depression   . Migraine without aura   . Polycystic ovary syndrome   . Psychosis (HCC)   . Schizophrenia (HCC)   . STD (sexually transmitted disease) 10-14-15   chlamydia    History reviewed. No pertinent surgical history. Family History:  Family History  Problem Relation Age of Onset  . Hypertension Mother   . Heart disease Mother   . Cancer Mother   . Hypertension Other   . Depression Father   . Drug abuse Father   . Bipolar disorder Father   . Heart disease Maternal  Grandmother   . Pancreatitis Maternal Grandfather   . Cancer Paternal Grandmother   . Cancer Paternal Grandfather    Family Psychiatric  History: multiple family members with mental illness Social History:  Social History   Substance and Sexual Activity  Alcohol Use Yes  . Alcohol/week: 0.0 oz     Social History   Substance and Sexual Activity  Drug Use Yes  . Types: Marijuana   Comment: pt denies    Social History   Socioeconomic History  . Marital status: Single    Spouse name: Not on file  . Number of children: Not on file  . Years of education: Not on file  . Highest education level: Not on file  Occupational History  . Not on file  Social Needs  . Financial resource strain: Not on file  . Food insecurity:    Worry: Not on file    Inability: Not on file  . Transportation needs:    Medical: Not on file    Non-medical: Not on file  Tobacco Use  . Smoking status: Current Every Day Smoker    Packs/day: 0.50    Types: Cigarettes  . Smokeless tobacco: Never Used  Substance and Sexual Activity  . Alcohol use: Yes    Alcohol/week: 0.0 oz  . Drug use: Yes    Types: Marijuana    Comment: pt denies  . Sexual activity: Yes    Partners: Male    Birth control/protection: None  Lifestyle  . Physical activity:    Days per week:  Not on file    Minutes per session: Not on file  . Stress: Not on file  Relationships  . Social connections:    Talks on phone: Not on file    Gets together: Not on file    Attends religious service: Not on file    Active member of club or organization: Not on file    Attends meetings of clubs or organizations: Not on file    Relationship status: Not on file  Other Topics Concern  . Not on file  Social History Narrative  . Not on file   Additional Social History:                         Sleep: Fair  Appetite:  Fair  Current Medications: Current Facility-Administered Medications  Medication Dose Route Frequency  Provider Last Rate Last Dose  . acetaminophen (TYLENOL) tablet 650 mg  650 mg Oral Q6H PRN Clapacs, John T, MD      . alum & mag hydroxide-simeth (MAALOX/MYLANTA) 200-200-20 MG/5ML suspension 30 mL  30 mL Oral Q4H PRN Clapacs, John T, MD      . lamoTRIgine (LAMICTAL) tablet 25 mg  25 mg Oral Daily Clapacs, Jackquline Denmark, MD   25 mg at 09/30/17 0840  . LORazepam (ATIVAN) tablet 0.5 mg  0.5 mg Oral Q6H PRN Sherisse Fullilove B, MD      . magnesium hydroxide (MILK OF MAGNESIA) suspension 30 mL  30 mL Oral Daily PRN Clapacs, John T, MD      . OLANZapine zydis (ZYPREXA) disintegrating tablet 15 mg  15 mg Oral BID Dermot Gremillion B, MD   15 mg at 09/30/17 0839  . traZODone (DESYREL) tablet 100 mg  100 mg Oral QHS Knute Mazzuca B, MD   100 mg at 09/29/17 2130    Lab Results: No results found for this or any previous visit (from the past 48 hour(s)).  Blood Alcohol level:  Lab Results  Component Value Date   ETH <10 09/21/2017   ETH <10 09/19/2017    Metabolic Disorder Labs: Lab Results  Component Value Date   HGBA1C 5.2 09/25/2017   MPG 102.54 09/25/2017   MPG 105.41 09/24/2017   Lab Results  Component Value Date   PROLACTIN 4.0 10/14/2015   Lab Results  Component Value Date   CHOL 102 09/25/2017   TRIG 43 09/25/2017   HDL 41 09/25/2017   CHOLHDL 2.5 09/25/2017   VLDL 9 09/25/2017   LDLCALC 52 09/25/2017   LDLCALC 58 09/24/2017    Physical Findings: AIMS: Facial and Oral Movements Muscles of Facial Expression: None, normal Lips and Perioral Area: None, normal Jaw: None, normal Tongue: None, normal,Extremity Movements Upper (arms, wrists, hands, fingers): None, normal Lower (legs, knees, ankles, toes): None, normal, Trunk Movements Neck, shoulders, hips: None, normal, Overall Severity Severity of abnormal movements (highest score from questions above): None, normal Incapacitation due to abnormal movements: None, normal Patient's awareness of abnormal movements (rate  only patient's report): No Awareness, Dental Status Current problems with teeth and/or dentures?: No Does patient usually wear dentures?: No  CIWA:  CIWA-Ar Total: 0 COWS:  COWS Total Score: 2  Musculoskeletal: Strength & Muscle Tone: within normal limits Gait & Station: normal Patient leans: N/A  Psychiatric Specialty Exam: Physical Exam  Nursing note and vitals reviewed. Psychiatric: Her speech is normal. Her mood appears anxious. Her affect is labile and inappropriate. She is withdrawn. Thought content is paranoid and delusional. Cognition  and memory are impaired. She expresses impulsivity.    Review of Systems  Neurological: Negative.   Psychiatric/Behavioral: Positive for hallucinations.  All other systems reviewed and are negative.   Blood pressure 120/77, pulse (!) 106, temperature 97.6 F (36.4 C), temperature source Oral, resp. rate 17, height  (1.626 m), weight 81.6 kg (180 lb), last menstrual period 09/20/2017, SpO2 100 %.Body mass index is 30.9 kg/m.  General Appearance: Casual  Eye Contact:  Good  Speech:  Clear and Coherent  Volume:  Normal  Mood:  Anxious and Depressed  Affect:  Labile and Tearful  Thought Process:  Disorganized, Irrelevant and Descriptions of Associations: Loose  Orientation:  Full (Time, Place, and Person)  Thought Content:  Delusions and Paranoid Ideation  Suicidal Thoughts:  No  Homicidal Thoughts:  No  Memory:  Immediate;   Fair Recent;   Fair Remote;   Fair  Judgement:  Poor  Insight:  Lacking  Psychomotor Activity:  Normal  Concentration:  Concentration: Fair and Attention Span: Fair  Recall:  [employer] of Knowledge:  Fair  Language:  Fair  Akathisia:  No  Handed:  Right  AIMS (if indicated):     Assets:  Communication Skills Desire for Improvement Housing Physical Health Resilience Social Support  ADL's:  Intact  Cognition:  WNL  Sleep:  Number of Hours: 7.3     Treatment Plan Summary: Daily contact with  patient to assess and evaluate symptoms and progress in treatment and Medication management   Theresa [name] is a 22 year old female with a history of psychosis admitted for another psychotic break in the context of treatment noncomplinace.  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 problem is the past and was reportedly present prior to admission -no symptoms of catatonia -low dose Ativan available PRN -will not prescribe for home  #Psychosis, not improved -increase Zyprexa to 15 mg BID -continue Lamictal 25 mg daily  #Insomnia, improved on current medications -increase Trazodone to 100 mg nightly   #UTI -single dose of Fosfomycin given  #Cannabis abuse -patient minimizes problems and declines treatment  #Smoking cessation -refuses nicotine patch   #Labs, metabolic syndrome monitoring -Lipid panel, HgbA1C and TSH are all normal -pregnancy test is negative -EKG reviewed, QTc 454  #Admission status -IVC  #Disposition -discharge to home with family -follow up with Faith and Boyd Kerbs, MD 09/30/2017, 1:09 PM

## 2017-09-30 NOTE — Plan of Care (Signed)
Patient slept for Estimated Hours of 7.30; Precautionary checks every 15 minutes for safety maintained, room free of safety hazards, patient sustains no injury or falls during this shift.  Problem: Education: Goal: Knowledge of [facility] Education information/materials will improve Outcome: Progressing Goal: Emotional status will improve Outcome: Progressing Goal: Mental status will improve Outcome: Progressing Goal: Verbalization of understanding the information provided will improve Outcome: Progressing   Problem: Activity: Goal: Interest or engagement in activities will improve Outcome: Progressing Goal: Sleeping patterns will improve Outcome: Progressing   Problem: Coping: Goal: Ability to verbalize frustrations and anger appropriately will improve Outcome: Progressing Goal: Ability to demonstrate self-control will improve Outcome: Progressing   Problem: Health Behavior/Discharge Planning: Goal: Identification of resources available to assist in meeting health care needs will improve Outcome: Progressing Goal: Compliance with treatment plan for underlying cause of condition will improve Outcome: Progressing   Problem: Physical Regulation: Goal: Ability to maintain clinical measurements within normal limits will improve Outcome: Progressing   Problem: Safety: Goal: Periods of time without injury will increase Outcome: Progressing

## 2017-09-30 NOTE — Progress Notes (Signed)
Recreation Therapy Notes  Date: 09/30/2017  Time: 9:30 am  Location: Craft Room  Behavioral response: Appropriate  Intervention Topic: Happiness  Discussion/Intervention:  Group content today was focused on Happiness. The group defined happiness and stated reasons they are and are not happy at times. Participants identified reasons they are normally happy and why. Individuals expressed how not being happy affects themselves and others. Patients stated reasons why happiness is important to them. The group described how they feel when they are happy. Individuals participated in the intervention "What is happiness" where they defined what happiness means to them.  Clinical Observations/Feedback:  Patient came to group and described happiness as being around family and friends. She stated going places also makes her happy. Individual cried during group and smile while stating she was fine when group facilitator asked what was wrong. Patient was social with peers and staff during group.  Toneka Fullen LRT/CTRS         Tatym Schermer 09/30/2017 1:22 PM

## 2017-09-30 NOTE — Plan of Care (Signed)
Patient  aware of [facility]  Education  information received   Emotion and mental status improved . Attending unit activities  . Voice no concerns around sleep. No anger outburst able to maintain self control. Voice of no safety concerns  Problem: Safety: Goal: Periods of time without injury will increase Outcome: Progressing   Problem: Physical Regulation: Goal: Ability to maintain clinical measurements within normal limits will improve Outcome: Progressing   Problem: Health Behavior/Discharge Planning: Goal: Identification of resources available to assist in meeting health care needs will improve Outcome: Progressing Goal: Compliance with treatment plan for underlying cause of condition will improve Outcome: Progressing   Problem: Coping: Goal: Ability to verbalize frustrations and anger appropriately will improve Outcome: Progressing Goal: Ability to demonstrate self-control will improve Outcome: Progressing   Problem: Activity: Goal: Interest or engagement in activities will improve Outcome: Progressing Goal: Sleeping patterns will improve Outcome: Progressing   Problem: Education: Goal: Knowledge of [place] [facility] Education information/materials will improve Outcome: Progressing Goal: Emotional status will improve Outcome: Progressing Goal: Mental status will improve Outcome: Progressing Goal: Verbalization of understanding the information provided will improve Outcome: Progressing

## 2017-09-30 NOTE — [facility] Group Notes (Signed)

## 2017-09-30 NOTE — Progress Notes (Signed)
D: Patient stated slept good last night .Stated appetite is good and energy level  Is normal. Stated concentration is good . Stated on Depression scale 0 , hopeless 0 and anxiety 0.( low 0-10 high) Denies suicidal  homicidal ideations  .  No auditory hallucinations  No pain concerns . Appropriate ADL'S. Interacting with peers and staff.  Voice of wanting to go home  No period  Of  Tearfulness  A: Encourage patient participation with unit programming . Instruction  Given on  Medication , verbalize understanding. R: Voice no other concerns. Staff continue to monitor

## 2017-09-30 NOTE — Progress Notes (Signed)
Patient ID: Theresa [name], female   DOB: 16-Jun-1995, 22 y.o.   MRN: [id number] Visible in the milieu, pleasant on approach, mood and affect still flat, starring but not catatonic; spontaneously and instantaneously response to, during conversation; no thoughts blocking or delayed in response  expressing feelings with mixed mood & crying, "I want to go home..." Asked relevant questions about her medications and complied.

## 2017-10-01 MED ORDER — OLANZAPINE 15 MG PO TABS: 30.0000 mg | ORAL_TABLET | Freq: Every day | ORAL | 1 refills | Status: DC

## 2017-10-01 MED ORDER — TRAZODONE HCL 100 MG PO TABS: 100.0000 mg | ORAL_TABLET | Freq: Every day | ORAL | 1 refills | Status: DC

## 2017-10-01 MED ORDER — OLANZAPINE 10 MG PO TABS
30.0000 mg | ORAL_TABLET | Freq: Every day | ORAL | Status: DC
  Administered 2017-10-01: 30 mg via ORAL
  Filled 2017-10-01: qty 3

## 2017-10-01 MED ORDER — LAMOTRIGINE 25 MG PO TABS: 50.0000 mg | ORAL_TABLET | Freq: Every day | ORAL | 1 refills | Status: DC

## 2017-10-01 MED ORDER — LAMOTRIGINE 25 MG PO TABS
50.0000 mg | ORAL_TABLET | Freq: Every day | ORAL | Status: DC
  Administered 2017-10-01: 50 mg via ORAL
  Filled 2017-10-01: qty 2

## 2017-10-01 MED ORDER — ALBUTEROL SULFATE HFA 108 (90 BASE) MCG/ACT IN AERS: 1.0000 | INHALATION_SPRAY | Freq: Four times a day (QID) | RESPIRATORY_TRACT | 0 refills | Status: DC | PRN

## 2017-10-01 NOTE — Progress Notes (Signed)
[facility] MD Progress Note  10/01/2017 1:38 PM Theresa [name]  MRN:  [id number]  Subjective:   Theresa [name] is much better today. She was able to have a normal conversation today. She "likes" her medicines. No side effects. No mood swings or crying. Visit with the mother went well last night. Denies paranoia, anxiety or depression. Good program participation.  Spoke with the mother. She noticed much improvement. Family meeting tomorrow.  Principal Problem: Undifferentiated schizophrenia (HCC) Diagnosis:   Patient Active Problem List   Diagnosis Date Noted  . Undifferentiated schizophrenia (HCC) [F20.3] 09/23/2017    Priority: High  . UTI (urinary tract infection) [N39.0] 09/28/2017  . Bipolar affective disorder (HCC) [F31.9] 09/20/2017  . Bipolar affective disorder, mixed, severe, with psychotic behavior (HCC) [F31.64] 10/04/2016  . PCOS (polycystic ovarian syndrome) [E28.2] 01/18/2016  . Cannabis use disorder, moderate, dependence (HCC) [F12.20] 12/06/2015  . Tobacco use disorder [F17.200] 12/05/2015   Total Time spent with patient: 20 minutes  Past Psychiatric History: psychosis  Past Medical History:  Past Medical History:  Diagnosis Date  . Abnormal vaginal Pap smear   . Anxiety   . Bipolar disorder (HCC)   . Depression   . Migraine without aura   . Polycystic ovary syndrome   . Psychosis (HCC)   . Schizophrenia (HCC)   . STD (sexually transmitted disease) 10-14-15   chlamydia    History reviewed. No pertinent surgical history. Family History:  Family History  Problem Relation Age of Onset  . Hypertension Mother   . Heart disease Mother   . Cancer Mother   . Hypertension Other   . Depression Father   . Drug abuse Father   . Bipolar disorder Father   . Heart disease Maternal Grandmother   . Pancreatitis Maternal Grandfather   . Cancer Paternal Grandmother   . Cancer Paternal Grandfather    Family Psychiatric  History: multiple family members with mental illness,  bipolar Social History:  Social History   Substance and Sexual Activity  Alcohol Use Yes  . Alcohol/week: 0.0 oz     Social History   Substance and Sexual Activity  Drug Use Yes  . Types: Marijuana   Comment: pt denies    Social History   Socioeconomic History  . Marital status: Single    Spouse name: Not on file  . Number of children: Not on file  . Years of education: Not on file  . Highest education level: Not on file  Occupational History  . Not on file  Social Needs  . Financial resource strain: Not on file  . Food insecurity:    Worry: Not on file    Inability: Not on file  . Transportation needs:    Medical: Not on file    Non-medical: Not on file  Tobacco Use  . Smoking status: Current Every Day Smoker    Packs/day: 0.50    Types: Cigarettes  . Smokeless tobacco: Never Used  Substance and Sexual Activity  . Alcohol use: Yes    Alcohol/week: 0.0 oz  . Drug use: Yes    Types: Marijuana    Comment: pt denies  . Sexual activity: Yes    Partners: Male    Birth control/protection: None  Lifestyle  . Physical activity:    Days per week: Not on file    Minutes per session: Not on file  . Stress: Not on file  Relationships  . Social connections:    Talks on phone: Not on file  Gets together: Not on file    Attends religious service: Not on file    Active member of club or organization: Not on file    Attends meetings of clubs or organizations: Not on file    Relationship status: Not on file  Other Topics Concern  . Not on file  Social History Narrative  . Not on file   Additional Social History:                         Sleep: Fair  Appetite:  Fair  Current Medications: Current Facility-Administered Medications  Medication Dose Route Frequency Provider Last Rate Last Dose  . acetaminophen (TYLENOL) tablet 650 mg  650 mg Oral Q6H PRN Clapacs, John T, MD      . alum & mag hydroxide-simeth (MAALOX/MYLANTA) 200-200-20 MG/5ML suspension  30 mL  30 mL Oral Q4H PRN Clapacs, John T, MD      . lamoTRIgine (LAMICTAL) tablet 50 mg  50 mg Oral QHS Keiana Tavella B, MD      . LORazepam (ATIVAN) tablet 0.5 mg  0.5 mg Oral Q6H PRN Aretta Stetzel B, MD      . magnesium hydroxide (MILK OF MAGNESIA) suspension 30 mL  30 mL Oral Daily PRN Clapacs, John T, MD      . OLANZapine (ZYPREXA) tablet 30 mg  30 mg Oral QHS Sean Macwilliams B, MD      . traZODone (DESYREL) tablet 100 mg  100 mg Oral QHS Shabazz Mckey B, MD   100 mg at 09/30/17 2115    Lab Results: No results found for this or any previous visit (from the past 48 hour(s)).  Blood Alcohol level:  Lab Results  Component Value Date   ETH <10 09/21/2017   ETH <10 09/19/2017    Metabolic Disorder Labs: Lab Results  Component Value Date   HGBA1C 5.2 09/25/2017   MPG 102.54 09/25/2017   MPG 105.41 09/24/2017   Lab Results  Component Value Date   PROLACTIN 4.0 10/14/2015   Lab Results  Component Value Date   CHOL 102 09/25/2017   TRIG 43 09/25/2017   HDL 41 09/25/2017   CHOLHDL 2.5 09/25/2017   VLDL 9 09/25/2017   LDLCALC 52 09/25/2017   LDLCALC 58 09/24/2017    Physical Findings: AIMS: Facial and Oral Movements Muscles of Facial Expression: None, normal Lips and Perioral Area: None, normal Jaw: None, normal Tongue: None, normal,Extremity Movements Upper (arms, wrists, hands, fingers): None, normal Lower (legs, knees, ankles, toes): None, normal, Trunk Movements Neck, shoulders, hips: None, normal, Overall Severity Severity of abnormal movements (highest score from questions above): None, normal Incapacitation due to abnormal movements: None, normal Patient's awareness of abnormal movements (rate only patient's report): No Awareness, Dental Status Current problems with teeth and/or dentures?: No Does patient usually wear dentures?: No  CIWA:  CIWA-Ar Total: 0 COWS:  COWS Total Score: 2  Musculoskeletal: Strength & Muscle Tone: within normal  limits Gait & Station: normal Patient leans: N/A  Psychiatric Specialty Exam: Physical Exam  Nursing note and vitals reviewed. Psychiatric: Her speech is normal and behavior is normal. Judgment and thought content normal. Her affect is blunt. Cognition and memory are normal.    Review of Systems  Neurological: Negative.   Psychiatric/Behavioral: Negative.   All other systems reviewed and are negative.   Blood pressure 136/73, pulse 79, temperature 98.4 F (36.9 C), temperature source Oral, resp. rate 18, height  (1.626 m), weight  81.6 kg (180 lb), last menstrual period 09/20/2017, SpO2 100 %.Body mass index is 30.9 kg/m.  General Appearance: Casual  Eye Contact:  Good  Speech:  Clear and Coherent  Volume:  Normal  Mood:  Euthymic  Affect:  Flat  Thought Process:  Goal Directed and Descriptions of Associations: Intact  Orientation:  Full (Time, Place, and Person)  Thought Content:  WDL  Suicidal Thoughts:  No  Homicidal Thoughts:  No  Memory:  Immediate;   Fair Recent;   Fair Remote;   Fair  Judgement:  Impaired  Insight:  Shallow  Psychomotor Activity:  Normal  Concentration:  Concentration: Fair and Attention Span: Fair  Recall:  [employer] of Knowledge:  Fair  Language:  Fair  Akathisia:  No  Handed:  Right  AIMS (if indicated):     Assets:  Communication Skills Desire for Improvement Housing Physical Health Resilience Social Support Transportation  ADL's:  Intact  Cognition:  WNL  Sleep:  Number of Hours: 6.3     Treatment Plan Summary: Daily contact with patient to assess and evaluate symptoms and progress in treatment and Medication management   Theresa [name] is a 22 year old female with a history of psychosis admitted for another psychotic break in the context of treatment noncomplinace.  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 problem is the past and was reportedly present prior to admission -no symptoms of catatonia -discontinue Ativan   #Psychosis,  improved -switch Zyprexa to nighttime -increase lamictal to 50 mg nightly  #Insomnia, improved on current medications -increase Trazodone to 100 mg nightly   #UTI -single dose of Fosfomycin given  #Cannabis abuse -patient minimizes problems and declines treatment  #Smoking cessation -refusesnicotine patch   #Labs, metabolic syndrome monitoring -Lipid panel, HgbA1C and TSHare all normal -pregnancy test is negative -EKG reviewed, QTc 454  #Admission status -IVC  #Disposition -discharge to home with family -follow up with Faith and Boyd Kerbs, MD 10/01/2017, 1:38 PM

## 2017-10-01 NOTE — Plan of Care (Signed)
Patient  aware of [facility]  Education  information received   Emotion and mental status improved . Attending unit activities  . Voice no concerns around sleep. No anger outburst able to maintain self control. Voice of no safety concerns    Problem: Education: Goal: Knowledge of [place] [facility] Education information/materials will improve Outcome: Progressing Goal: Emotional status will improve Outcome: Progressing Goal: Mental status will improve Outcome: Progressing Goal: Verbalization of understanding the information provided will improve Outcome: Progressing   Problem: Activity: Goal: Interest or engagement in activities will improve Outcome: Progressing Goal: Sleeping patterns will improve Outcome: Progressing   Problem: Coping: Goal: Ability to verbalize frustrations and anger appropriately will improve Outcome: Progressing Goal: Ability to demonstrate self-control will improve Outcome: Progressing   Problem: Health Behavior/Discharge Planning: Goal: Identification of resources available to assist in meeting health care needs will improve Outcome: Progressing Goal: Compliance with treatment plan for underlying cause of condition will improve Outcome: Progressing   Problem: Physical Regulation: Goal: Ability to maintain clinical measurements within normal limits will improve Outcome: Progressing   Problem: Safety: Goal: Periods of time without injury will increase Outcome: Progressing

## 2017-10-01 NOTE — [facility] Group Notes (Signed)
LCSW Group Therapy Note  10/01/2017 1:00pm  Type of Therapy/Topic:  Group Therapy:  Emotion Regulation  Participation Level:  Minimal   Description of Group:   The purpose of this group is to assist patients in learning to regulate negative emotions and experience positive emotions. Patients will be guided to discuss ways in which they have been vulnerable to their negative emotions. These vulnerabilities will be juxtaposed with experiences of positive emotions or situations, and patients will be challenged to use positive emotions to combat negative ones. Special emphasis will be placed on coping with negative emotions in conflict situations, and patients will process healthy conflict resolution skills.  Therapeutic Goals: 1. Patient will identify two positive emotions or experiences to reflect on in order to balance out negative emotions 2. Patient will label two or more emotions that they find the most difficult to experience 3. Patient will demonstrate positive conflict resolution skills through discussion and/or role plays  Summary of Patient Progress:    Attentive but minimal participation   Therapeutic Modalities:   Cognitive Behavioral Therapy Feelings Identification Dialectical Behavioral Therapy   Glennon Mac, LCSW 10/01/2017 2:12 PM

## 2017-10-01 NOTE — Progress Notes (Signed)
Patient denied any acute pain as well as SI/HI/AVH. No catatonic behavior noted this shift. Patient was pleasant with no crying spell. Parent visited and they were given update on her per their request.  Will continue to monitor every 15 minutes.

## 2017-10-01 NOTE — Progress Notes (Signed)
D: Patient stated slept good last night .Stated appetite is good and energy level  Is normal. Stated concentration is good . Stated on Depression scale , o hopeless 0 and anxiety 0 .( low 0-10 high) Denies suicidal  homicidal ideations  .  No auditory hallucinations  No pain concerns . Appropriate ADL'S. Interacting with peers and staff.  Voice of feeling like  She had a panic attack  Encourage patient to breath and  Work on using her coping skills . Patient response appropriate  A: Encourage patient participation with unit programming . Instruction  Given on  Medication , verbalize understanding. R: Voice no other concerns. Staff continue to monitor

## 2017-10-01 NOTE — Progress Notes (Signed)
Patient slept for 6 hours and 30 minutes. No issues.

## 2017-10-01 NOTE — Progress Notes (Signed)
Recreation Therapy Notes   Date: 10/01/2017  Time: 9:30 am  Location: Craft Room  Behavioral response: Appropriate   Intervention Topic: Stress  Discussion/Intervention:  Group content on today was focused on stress. The group defined stress and ways to cope with stress. Participants expressed how they know when they are stresses out and certain triggers that stress them out. Individuals described the different ways they have to cope with stress. The group stated reasons why it is important to cope with stress. Patient explained what good stress is and some examples. The group participated in the intervention "Managing Stress". Individuals were able to identify new ways to cope with stress. Clinical Observations/Feedback:  Patient came to group stated others know that she is stressed out  By her facial expression. She described listen to music as a coping skill she uses. Individual participated in the intervention during group. Wilmetta Speiser LRT/CTRS         Trinia Georgi 10/01/2017 1:10 PM

## 2017-10-02 MED ORDER — NORGESTIM-ETH ESTRAD TRIPHASIC 0.18/0.215/0.25 MG-25 MCG PO TABS: 1.0000 | ORAL_TABLET | Freq: Every day | ORAL | 11 refills | Status: DC

## 2017-10-02 NOTE — Discharge Summary (Addendum)
Physician Discharge Summary Note  Patient:  Theresa [name] is an 22 y.o., female MRN:  [id number] DOB:  May 03, 1996 Patient phone:  [phone] (home)  Patient address:   [address] [place] [address],  Total Time spent with patient: 20 minutes plus 30 min on care coordination, documentation and family meeting.  Date of Admission:  09/23/2017 Date of Discharge: 10/02/2017  Reason for Admission:  Psychotic break.  History of Present Illness: 22 year old woman transferred from 1 of the sister hospitals in [place].  History obtained from patient to some degree also from the patient's mother.  Current bout of illness has been bad for at least several weeks.  Patient was at the emergency room in [facility] and was discharged with the opinion that she did not meet commitment criteria.  Rapidly came back to the hospital obviously very psychotic.  Patient is very limited in how much she will talk.  With coaching from her mother she was able to offer a couple of words to me.  Patient is demanding from her mother that she be discharged but is not able to think clearly articulate anything about her current condition.  Hygiene appears to be adequate a healthy appears to be adequate.  Not showing any signs of attempting to hurt her self or anyone else.  Social history: Patient lives with her mother.  Not able to work.  Has been turned down once for disability.  Medical history: Polycystic ovarian syndrome overweight but otherwise stable  Substance abuse: Cannabis use in the past not able to answer questions about it currently but the mother says she thinks she is been smoking pot.  Associated Signs/Symptoms: Depression Symptoms:  anhedonia, impaired memory, (Hypo) Manic Symptoms:  Impulsivity, Irritable Mood, Anxiety Symptoms:  Social Anxiety, Psychotic Symptoms:  Paranoia, PTSD Symptoms: Negative  Past Psychiatric History: Patient has a history of multiple hospitalizations with a  diagnosis of either schizophrenia or schizoaffective disorder.  Has shown improvement in the past with a variety of antipsychotics.  Mother thinks that Zyprexa worked better in the long run because it did not cause as much akathisia.  No history of suicide attempts or violence.  Has a long history of noncompliance with medicine because of poor insight.  Family Psychiatric  History: Patient had a grandparent with a psychotic disorder  Social History: She lives with her parents. She is no longer employed. She applied for disability.  Principal Problem: Undifferentiated schizophrenia [facility]) Discharge Diagnoses: Patient Active Problem List   Diagnosis Date Noted  . Undifferentiated schizophrenia (HCC) [F20.3] 09/23/2017    Priority: High  . UTI (urinary tract infection) [N39.0] 09/28/2017  . Bipolar affective disorder (HCC) [F31.9] 09/20/2017  . Bipolar affective disorder, mixed, severe, with psychotic behavior (HCC) [F31.64] 10/04/2016  . PCOS (polycystic ovarian syndrome) [E28.2] 01/18/2016  . Cannabis use disorder, moderate, dependence (HCC) [F12.20] 12/06/2015  . Tobacco use disorder [F17.200] 12/05/2015    Past Medical History:  Past Medical History:  Diagnosis Date  . Abnormal vaginal Pap smear   . Anxiety   . Bipolar disorder (HCC)   . Depression   . Migraine without aura   . Polycystic ovary syndrome   . Psychosis (HCC)   . Schizophrenia (HCC)   . STD (sexually transmitted disease) 10-14-15   chlamydia    History reviewed. No pertinent surgical history. Family History:  Family History  Problem Relation Age of Onset  . Hypertension Mother   . Heart disease Mother   . Cancer Mother   .  Hypertension Other   . Depression Father   . Drug abuse Father   . Bipolar disorder Father   . Heart disease Maternal Grandmother   . Pancreatitis Maternal Grandfather   . Cancer Paternal Grandmother   . Cancer Paternal Grandfather    Social History:  Social History   Substance and  Sexual Activity  Alcohol Use Yes  . Alcohol/week: 0.0 oz     Social History   Substance and Sexual Activity  Drug Use Yes  . Types: Marijuana   Comment: pt denies    Social History   Socioeconomic History  . Marital status: Single    Spouse name: Not on file  . Number of children: Not on file  . Years of education: Not on file  . Highest education level: Not on file  Occupational History  . Not on file  Social Needs  . Financial resource strain: Not on file  . Food insecurity:    Worry: Not on file    Inability: Not on file  . Transportation needs:    Medical: Not on file    Non-medical: Not on file  Tobacco Use  . Smoking status: Current Every Day Smoker    Packs/day: 0.50    Types: Cigarettes  . Smokeless tobacco: Never Used  Substance and Sexual Activity  . Alcohol use: Yes    Alcohol/week: 0.0 oz  . Drug use: Yes    Types: Marijuana    Comment: pt denies  . Sexual activity: Yes    Partners: Male    Birth control/protection: None  Lifestyle  . Physical activity:    Days per week: Not on file    Minutes per session: Not on file  . Stress: Not on file  Relationships  . Social connections:    Talks on phone: Not on file    Gets together: Not on file    Attends religious service: Not on file    Active member of club or organization: Not on file    Attends meetings of clubs or organizations: Not on file    Relationship status: Not on file  Other Topics Concern  . Not on file  Social History Narrative  . Not on file    Hospital Course:    Theresa [name] is a 22 year old female with a history of psychosis admitted for another psychotic break in the context of treatment noncomplinace. By the time of discharge, all her symptoms have resolved. She tolerated medications well. There was a family meeting at discharge.  #Catatonia, was a problem is the past and was reportedly present prior to admission -no symptoms of catatonia  #Psychosis, resolved -continue  Zyprexa 30 mg nightly  -continue Lamictal titration, currently 50 mg nightly for 2 weeks  #Insomnia, improved on current medications -continue Trazodone 100 mg nightly PRN  #UTI -single dose of Fosfomycin given  #Cannabis abuse -patient minimizes problems and declines treatment  #Smoking cessation -refusesnicotine patch   #Labs, metabolic syndrome monitoring -Lipid panel, HgbA1C and TSHare all normal -pregnancy test is negative -EKG reviewed, QTc 454  #Admission status -IVC  #Disposition -discharge to home with family -[facility]    Physical Findings: AIMS: Facial and Oral Movements Muscles of Facial Expression: None, normal Lips and Perioral Area: None, normal Jaw: None, normal Tongue: None, normal,Extremity Movements Upper (arms, wrists, hands, fingers): None, normal Lower (legs, knees, ankles, toes): None, normal, Trunk Movements Neck, shoulders, hips: None, normal, Overall Severity Severity of abnormal movements (highest score from questions above):  None, normal Incapacitation due to abnormal movements: None, normal Patient's awareness of abnormal movements (rate only patient's report): No Awareness, Dental Status Current problems with teeth and/or dentures?: No Does patient usually wear dentures?: No  CIWA:  CIWA-Ar Total: 0 COWS:  COWS Total Score: 2  Musculoskeletal: Strength & Muscle Tone: within normal limits Gait & Station: normal Patient leans: N/A  Psychiatric Specialty Exam: Physical Exam  Nursing note and vitals reviewed. Psychiatric: She has a normal mood and affect. Her speech is normal and behavior is normal. Thought content normal. Cognition and memory are normal. She expresses impulsivity.    Review of Systems  Neurological: Negative.   Psychiatric/Behavioral: Negative.   All other systems reviewed and are negative.   Blood pressure 124/71, pulse 90, temperature 98.3 F (36.8 C), resp. rate 18, height  (1.626  m), weight 81.6 kg (180 lb), last menstrual period 09/20/2017, SpO2 100 %.Body mass index is 30.9 kg/m.  General Appearance: Casual  Eye Contact:  Good  Speech:  Clear and Coherent  Volume:  Normal  Mood:  Euthymic  Affect:  Appropriate  Thought Process:  Goal Directed and Descriptions of Associations: Intact  Orientation:  Full (Time, Place, and Person)  Thought Content:  WDL  Suicidal Thoughts:  No  Homicidal Thoughts:  No  Memory:  Immediate;   Fair Recent;   Fair Remote;   Fair  Judgement:  Impaired  Insight:  Shallow  Psychomotor Activity:  Normal  Concentration:  Concentration: Fair and Attention Span: Fair  Recall:  [employer] of Knowledge:  Fair  Language:  Fair  Akathisia:  No  Handed:  Right  AIMS (if indicated):     Assets:  Communication Skills Desire for Improvement Housing Physical Health Resilience Social Support  ADL's:  Intact  Cognition:  WNL  Sleep:  Number of Hours: 7.45     Have you used any form of tobacco in the last 30 days? (Cigarettes, Smokeless Tobacco, Cigars, and/or Pipes): No  Has this patient used any form of tobacco in the last 30 days? (Cigarettes, Smokeless Tobacco, Cigars, and/or Pipes) Yes, Yes, A prescription for an FDA-approved tobacco cessation medication was offered at discharge and the patient refused  Blood Alcohol level:  Lab Results  Component Value Date   [facility] <10 09/21/2017   ETH <10 09/19/2017    Metabolic Disorder Labs:  Lab Results  Component Value Date   HGBA1C 5.2 09/25/2017   MPG 102.54 09/25/2017   MPG 105.41 09/24/2017   Lab Results  Component Value Date   PROLACTIN 4.0 10/14/2015   Lab Results  Component Value Date   CHOL 102 09/25/2017   TRIG 43 09/25/2017   HDL 41 09/25/2017   CHOLHDL 2.5 09/25/2017   VLDL 9 09/25/2017   LDLCALC 52 09/25/2017   LDLCALC 58 09/24/2017    See Psychiatric Specialty Exam and Suicide Risk Assessment completed by Attending Physician prior to discharge.  Discharge  destination:  Home  Is patient on multiple antipsychotic therapies at discharge:  No   Has Patient had three or more failed trials of antipsychotic monotherapy by history:  No  Recommended Plan for Multiple Antipsychotic Therapies: NA  Discharge Instructions    Diet - low sodium heart healthy   Complete by:  As directed    Increase activity slowly   Complete by:  As directed      Allergies as of 10/02/2017   No Known Allergies     Medication List    TAKE  these medications     Indication  albuterol 108 (90 Base) MCG/ACT inhaler Commonly known as:  PROVENTIL HFA;VENTOLIN HFA Inhale 1-2 puffs into the lungs every 6 (six) hours as needed for wheezing or shortness of breath.  Indication:  Disease Involving Spasms of the Bronchus   lamoTRIgine 25 MG tablet Commonly known as:  LAMICTAL Take 2 tablets (50 mg total) by mouth at bedtime. Take 2 pills at bedtime for 2 weeks, 4 pills for 2 weeks, 8 pills or 200 mg after  Indication:  Manic-Depression   Norgestimate-Ethinyl Estradiol Triphasic 0.18/0.215/0.25 MG-25 MCG tab Commonly known as:  ORTHO TRI-CYCLEN LO Take 1 tablet by mouth daily.  Indication:  Birth Control Treatment, PCO   OLANZapine 15 MG tablet Commonly known as:  ZYPREXA Take 2 tablets (30 mg total) by mouth at bedtime.  Indication:  Depressive Phase of Manic-Depression   traZODone 100 MG tablet Commonly known as:  DESYREL Take 1 tablet (100 mg total) by mouth at bedtime.  Indication:  Trouble Sleeping      Follow-up Information    [facility]. Go on 10/08/2017.   Why:  Please follow up with your provider on Wednesday Oct 08, 2017 at 8am. Please bring your Picture ID, Social Security Card, [employer] Card if applicable and proof of income. Thank you.  Contact information: [address] [place] [address] [phone]           Follow-up recommendations:  Activity:  as tolerated Diet:  low sodium heart healthy Other:  keep follow up  appointments  Comments:    Signed: Kristine Linea, MD 10/02/2017, 9:44 AM

## 2017-10-02 NOTE — [facility] Group Notes (Signed)
[facility] Group Notes:  (Nursing/MHT/Case Management/Adjunct)  Date:  10/02/2017  Time:  12:15 AM  Type of Therapy:  Group Therapy  Participation Level:  Active  Participation Quality:  Appropriate  Affect:  Appropriate  Cognitive:  Appropriate  Insight:  Good  Engagement in Group:  Engaged  Modes of Intervention:  Support  Summary of Progress/Problems:  Mayra Neer 10/02/2017, 12:15 AM

## 2017-10-02 NOTE — Plan of Care (Signed)
Patient is responding well to treatment and socializing more with peers and ready to be discharged. Patient is happy with her progress and treatment denies any SI/HI and no signes AVH 15 minute safety rounding in progress no distress noted. Problem: Education: Goal: Knowledge of [place] [facility] Education information/materials will improve Outcome: Progressing Goal: Emotional status will improve Outcome: Progressing Goal: Mental status will improve Outcome: Progressing Goal: Verbalization of understanding the information provided will improve Outcome: Progressing   Problem: Activity: Goal: Interest or engagement in activities will improve Outcome: Progressing Goal: Sleeping patterns will improve Outcome: Progressing   Problem: Coping: Goal: Ability to verbalize frustrations and anger appropriately will improve Outcome: Progressing Goal: Ability to demonstrate self-control will improve Outcome: Progressing   Problem: Health Behavior/Discharge Planning: Goal: Identification of resources available to assist in meeting health care needs will improve Outcome: Progressing Goal: Compliance with treatment plan for underlying cause of condition will improve Outcome: Progressing   Problem: Physical Regulation: Goal: Ability to maintain clinical measurements within normal limits will improve Outcome: Progressing   Problem: Safety: Goal: Periods of time without injury will increase Outcome: Progressing

## 2017-10-02 NOTE — [facility] Group Notes (Signed)
LCSW Group Therapy Note 10/02/2017 9:00 AM  Type of Therapy and Topic:  Group Therapy:  Setting Goals  Participation Level:  Active  Description of Group: In this process group, patients discussed using strengths to work toward goals and address challenges.  Patients identified two positive things about themselves and one goal they were working on.  Patients were given the opportunity to share openly and support each other's plan for self-empowerment.  The group discussed the value of gratitude and were encouraged to have a daily reflection of positive characteristics or circumstances.  Patients were encouraged to identify a plan to utilize their strengths to work on current challenges and goals.  Therapeutic Goals 1. Patient will verbalize personal strengths/positive qualities and relate how these can assist with achieving desired personal goals 2. Patients will verbalize affirmation of peers plans for personal change and goal setting 3. Patients will explore the value of gratitude and positive focus as related to successful achievement of goals 4. Patients will verbalize a plan for regular reinforcement of personal positive qualities and circumstances.  Summary of Patient Progress:  Theresa [name] was able to actively participate in today's group discussion on setting goals using the SMART Model.  Theresa [name] appeared to have a good understanding of how she can develop her own goals using the SMART Model.  Theresa [name] shared that her goal for today is "to discharge today without any problems.  I will work on some goals when I get home, but I'm not sure what those will be at this time."     Therapeutic Modalities Cognitive Behavioral Therapy Motivational Interviewing    Alease Frame, LCSW 10/02/2017 10:53 AM

## 2017-10-02 NOTE — Progress Notes (Signed)
  [facility] Adult Case Management Discharge Plan :  Will you be returning to the same living situation after discharge:  Yes,  Home with mother At discharge, do you have transportation home?: Yes,  Mother coming at Discharge Do you have the ability to pay for your medications: Yes,  Referred to a provider who can assist  Release of information consent forms completed and in the chart;  Patient's signature needed at discharge.  Patient to Follow up at: Follow-up Information    [facility]. Go on 10/08/2017.   Why:  Please follow up with your provider on Wednesday Oct 08, 2017 at 8am. Please bring your Picture ID, Social Security Card, [employer] Card if applicable and proof of income. Thank you.  Contact information: [address] [place] [address] [phone]           Next level of care provider has access to [facility] Link:no  Safety Planning and Suicide Prevention discussed: Yes,  [name]), mother, [phone]  Have you used any form of tobacco in the last 30 days? (Cigarettes, Smokeless Tobacco, Cigars, and/or Pipes): No  Has patient been referred to the Quitline?: N/A patient is not a smoker  Patient has been referred for addiction treatment: Yes  Johny Shears, LCSW 10/02/2017, 8:40 AM

## 2017-10-02 NOTE — Progress Notes (Signed)
Recreation Therapy Notes  INPATIENT RECREATION TR PLAN  Patient Details Name: Theresa [name] MRN: [id number] DOB: January 22, 1996 Today's Date: 10/02/2017  Rec Therapy Plan Is patient appropriate for Therapeutic Recreation?: Yes Treatment times per week: at least 3 Estimated Length of Stay: 5-7 days TR Treatment/Interventions: Group participation (Comment)  Discharge Criteria Pt will be discharged from therapy if:: Discharged Treatment plan/goals/alternatives discussed and agreed upon by:: Patient/family  Discharge Summary Short term goals set: Patient will engage in groups without prompting or encouragement from LRT x3 group sessions within 5 recreation therapy group sessions Short term goals met: Adequate for discharge Progress toward goals comments: Groups attended Which groups?: Stress management, Other (Comment)(Happiness, Values) Reason goals not met: N/A Therapeutic equipment acquired: N/A Reason patient discharged from therapy: Discharge from hospital Pt/family agrees with progress & goals achieved: Yes Date patient discharged from therapy: 10/02/17   Grason Brailsford 10/02/2017, 3:12 PM

## 2017-10-02 NOTE — [facility] Suicide Risk Assessment (Signed)
[facility] Discharge Suicide Risk Assessment   Principal Problem: Undifferentiated schizophrenia [facility]) Discharge Diagnoses:  Patient Active Problem List   Diagnosis Date Noted  . Undifferentiated schizophrenia (HCC) [F20.3] 09/23/2017    Priority: High  . UTI (urinary tract infection) [N39.0] 09/28/2017  . Bipolar affective disorder (HCC) [F31.9] 09/20/2017  . Bipolar affective disorder, mixed, severe, with psychotic behavior (HCC) [F31.64] 10/04/2016  . PCOS (polycystic ovarian syndrome) [E28.2] 01/18/2016  . Cannabis use disorder, moderate, dependence (HCC) [F12.20] 12/06/2015  . Tobacco use disorder [F17.200] 12/05/2015    Total Time spent with patient: 20 minutes plus 30 min on care coordination, documantation and family meeting  Musculoskeletal: Strength & Muscle Tone: within normal limits Gait & Station: normal Patient leans: N/A  Psychiatric Specialty Exam: Review of Systems  Neurological: Negative.   Psychiatric/Behavioral: Negative.   All other systems reviewed and are negative.   Blood pressure 136/73, pulse 79, temperature 98.4 F (36.9 C), temperature source Oral, resp. rate 18, height  (1.626 m), weight 81.6 kg (180 lb), last menstrual period 09/20/2017, SpO2 100 %.Body mass index is 30.9 kg/m.  General Appearance: Casual  Eye Contact::  Good  Speech:  Clear and Coherent409  Volume:  Normal  Mood:  Euthymic  Affect:  Appropriate  Thought Process:  Goal Directed and Descriptions of Associations: Intact  Orientation:  Full (Time, Place, and Person)  Thought Content:  WDL  Suicidal Thoughts:  No  Homicidal Thoughts:  No  Memory:  Immediate;   Fair Recent;   Fair Remote;   Fair  Judgement:  Impaired  Insight:  Shallow  Psychomotor Activity:  Normal  Concentration:  Fair  Recall:  [employer] of Knowledge:Fair  Language: Fair  Akathisia:  No  Handed:  Right  AIMS (if indicated):     Assets:  Communication Skills Desire for Improvement Housing Physical  Health Resilience Social Support  Sleep:  Number of Hours: 6.3  Cognition: WNL  ADL's:  Intact   Mental Status Per Nursing Assessment::   On Admission:  NA  Demographic Factors:  Adolescent or young adult, Caucasian and Unemployed  Loss Factors: Decrease in vocational status and Loss of significant relationship  Historical Factors: Prior suicide attempts, Family history of mental illness or substance abuse and Impulsivity  Risk Reduction Factors:   Sense of responsibility to family, Living with another person, especially a relative and Positive social support  Continued Clinical Symptoms:  Schizophrenia:   Less than 37 years old  Cognitive Features That Contribute To Risk:  None    Suicide Risk:  Minimal: No identifiable suicidal ideation.  Patients presenting with no risk factors but with morbid ruminations; may be classified as minimal risk based on the severity of the depressive symptoms  Follow-up Information    [facility]. Go on 10/08/2017.   Why:  Please follow up with your provider on Wednesday Oct 08, 2017 at 8am. Please bring your Picture ID, Social Security Card, [employer] Card if applicable and proof of income. Thank you.  Contact information: [address] [place] [address] [phone]           Plan Of Care/Follow-up recommendations:  Activity:  as tolerated Diet:  low sodium heart healthy Other:  keep follow up appointments  Kristine Linea, MD 10/02/2017, 12:53 AM

## 2017-10-17 ENCOUNTER — Inpatient Hospital Stay (HOSPITAL_COMMUNITY)
Admission: RE | Admit: 2017-10-17 | Discharge: 2017-10-23 | DRG: 885 | Disposition: A | Discharge status: Home / Self Care | Payer: No Typology Code available for payment source | Attending: Psychiatry | Admitting: Psychiatry

## 2017-10-17 ENCOUNTER — Encounter (HOSPITAL_COMMUNITY): Payer: Self-pay

## 2017-10-17 ENCOUNTER — Other Ambulatory Visit: Payer: Self-pay

## 2017-10-17 DIAGNOSIS — Z9114 Patient's other noncompliance with medication regimen: Secondary | ICD-10-CM

## 2017-10-17 DIAGNOSIS — F17210 Nicotine dependence, cigarettes, uncomplicated: Secondary | ICD-10-CM | POA: Diagnosis present

## 2017-10-17 DIAGNOSIS — Z79899 Other long term (current) drug therapy: Secondary | ICD-10-CM | POA: Diagnosis not present

## 2017-10-17 DIAGNOSIS — F1290 Cannabis use, unspecified, uncomplicated: Secondary | ICD-10-CM | POA: Diagnosis not present

## 2017-10-17 DIAGNOSIS — G4700 Insomnia, unspecified: Secondary | ICD-10-CM | POA: Diagnosis not present

## 2017-10-17 DIAGNOSIS — F200 Paranoid schizophrenia: Secondary | ICD-10-CM | POA: Diagnosis not present

## 2017-10-17 DIAGNOSIS — F419 Anxiety disorder, unspecified: Secondary | ICD-10-CM | POA: Diagnosis present

## 2017-10-17 DIAGNOSIS — Z818 Family history of other mental and behavioral disorders: Secondary | ICD-10-CM | POA: Diagnosis not present

## 2017-10-17 DIAGNOSIS — F209 Schizophrenia, unspecified: Secondary | ICD-10-CM | POA: Diagnosis not present

## 2017-10-17 DIAGNOSIS — R45851 Suicidal ideations: Secondary | ICD-10-CM | POA: Diagnosis not present

## 2017-10-17 DIAGNOSIS — Z813 Family history of other psychoactive substance abuse and dependence: Secondary | ICD-10-CM | POA: Diagnosis not present

## 2017-10-17 DIAGNOSIS — F250 Schizoaffective disorder, bipolar type: Secondary | ICD-10-CM | POA: Diagnosis not present

## 2017-10-17 MED ORDER — HALOPERIDOL LACTATE 5 MG/ML IJ SOLN: 1.0000 mg | Freq: Four times a day (QID) | INTRAMUSCULAR | Status: DC | PRN

## 2017-10-17 MED ORDER — DIPHENHYDRAMINE HCL 50 MG/ML IJ SOLN: 25.0000 mg | Freq: Four times a day (QID) | INTRAMUSCULAR | Status: DC | PRN

## 2017-10-17 MED ORDER — PALIPERIDONE ER 3 MG PO TB24
3.0000 mg | ORAL_TABLET | ORAL | Status: AC
  Filled 2017-10-17 (×2): qty 1

## 2017-10-17 MED ORDER — HALOPERIDOL 1 MG PO TABS: 1.0000 mg | ORAL_TABLET | Freq: Four times a day (QID) | ORAL | Status: DC | PRN

## 2017-10-17 MED ORDER — PALIPERIDONE ER 3 MG PO TB24
3.0000 mg | ORAL_TABLET | Freq: Every day | ORAL | Status: DC
  Administered 2017-10-18 – 2017-10-20 (×3): 3 mg via ORAL
  Filled 2017-10-17 (×4): qty 1

## 2017-10-17 MED ORDER — DIPHENHYDRAMINE HCL 25 MG PO CAPS: 25.0000 mg | ORAL_CAPSULE | Freq: Three times a day (TID) | ORAL | Status: DC | PRN

## 2017-10-17 MED ORDER — HYDROXYZINE HCL 25 MG PO TABS
25.0000 mg | ORAL_TABLET | Freq: Three times a day (TID) | ORAL | Status: DC | PRN
  Administered 2017-10-18 – 2017-10-19 (×2): 25 mg via ORAL
  Filled 2017-10-17 (×3): qty 1

## 2017-10-17 MED ORDER — LORAZEPAM 2 MG/ML IJ SOLN: 1.0000 mg | Freq: Four times a day (QID) | INTRAMUSCULAR | Status: DC | PRN

## 2017-10-17 MED ORDER — ALBUTEROL SULFATE HFA 108 (90 BASE) MCG/ACT IN AERS: 1.0000 | INHALATION_SPRAY | Freq: Four times a day (QID) | RESPIRATORY_TRACT | Status: DC | PRN

## 2017-10-17 MED ORDER — PALIPERIDONE ER 3 MG PO TB24
3.0000 mg | ORAL_TABLET | Freq: Once | ORAL | Status: DC
  Filled 2017-10-17: qty 1

## 2017-10-17 MED ORDER — HALOPERIDOL 1 MG PO TABS: 1.0000 mg | ORAL_TABLET | Freq: Three times a day (TID) | ORAL | Status: DC | PRN

## 2017-10-17 MED ORDER — LORAZEPAM 1 MG PO TABS: 1.0000 mg | ORAL_TABLET | Freq: Four times a day (QID) | ORAL | Status: DC | PRN

## 2017-10-17 NOTE — Tx Team (Signed)
Initial Treatment Plan 10/17/2017 6:43 PM Theresa [name] [id number]    PATIENT STRESSORS: Health problems Medication change or noncompliance   PATIENT STRENGTHS: Ability for insight Average or above average intelligence Communication skills Supportive family/friends   PATIENT IDENTIFIED PROBLEMS: Anxiety  Depression  Medication Noncompliance  Ineffective coping skills               DISCHARGE CRITERIA:  Ability to meet basic life and health needs Adequate post-discharge living arrangements  PRELIMINARY DISCHARGE PLAN: Attend aftercare/continuing care group Outpatient therapy Return to previous living arrangement  PATIENT/FAMILY INVOLVEMENT: This treatment plan has been presented to and reviewed with the patient, Theresa [name], and/or family member.  The patient and family have been given the opportunity to ask questions and make suggestions.  Clarene Critchley, RN 10/17/2017, 6:43 PM

## 2017-10-17 NOTE — BH Assessment (Signed)
Assessment Note  Theresa [name] is an 22 y.o. female present to [facility] as a walk-in accompanied by her mother. Patient is experiencing symptoms of delusions, auditory hallucinations, crying episodes, impaired communication, and paranoia. Patient's mother report patient has been experiencing symptoms since April 2019, feels patient should have never been discharged from the hospital. Report patient has not slept in 4 days and her appetite has decreased. Patient has to be watched per mother or she will wonder off. Patient refuses to take her medication, visit the hospital or re-visit her psychiatrist. Mother report history of mental health on patient's biological father sides of the family. Patient denies suicidal / homicidal ideations and visual hallucinations. Patient admits to hear voices but does not disclose what the voices are saying to her. Patient is not connected with a psychiatrist or therapist. Patient refuses to attend appointments. Patient was hospitalized at [facility], 2019. Patient is unable to maintain employment due to interference of her mental health symptoms.    Diagnosis:   F25.0   Schizoaffective disorder, Bipolar type  Past Medical History:  Past Medical History:  Diagnosis Date  . Abnormal vaginal Pap smear   . Anxiety   . Bipolar disorder (HCC)   . Depression   . Migraine without aura   . Polycystic ovary syndrome   . Psychosis (HCC)   . Schizophrenia (HCC)   . STD (sexually transmitted disease) 10-14-15   chlamydia     No past surgical history on file.  Family History:  Family History  Problem Relation Age of Onset  . Hypertension Mother   . Heart disease Mother   . Cancer Mother   . Hypertension Other   . Depression Father   . Drug abuse Father   . Bipolar disorder Father   . Heart disease Maternal Grandmother   . Pancreatitis Maternal Grandfather   . Cancer Paternal Grandmother   . Cancer Paternal Grandfather     Social  History:  reports that she has been smoking cigarettes.  She has been smoking about 0.50 packs per day. She has never used smokeless tobacco. She reports that she drinks alcohol. She reports that she has current or past drug history. Drug: Marijuana.  Additional Social History:  Alcohol / Drug Use Pain Medications: see MAR Prescriptions: see MAR Over the Counter: see MAR History of alcohol / drug use?: Yes Longest period of sobriety (when/how long): 1 - month, patient denies substance use Substance #2 Name of Substance 2: cigarettes 2 - Age of First Use: TEEN 2 - Amount (size/oz): 1/2 PACK  2 - Frequency: DAILY 2 - Duration: ONGOING 2 - Last Use / Amount: unknown  CIWA:   COWS:    Allergies: No Known Allergies  Home Medications:  Medications Prior to Admission  Medication Sig Dispense Refill  . albuterol (PROVENTIL HFA;VENTOLIN HFA) 108 (90 Base) MCG/ACT inhaler Inhale 1-2 puffs into the lungs every 6 (six) hours as needed for wheezing or shortness of breath. 1 Inhaler 0  . lamoTRIgine (LAMICTAL) 25 MG tablet Take 2 tablets (50 mg total) by mouth at bedtime. Take 2 pills at bedtime for 2 weeks, 4 pills for 2 weeks, 8 pills or 200 mg after 90 tablet 1  . Norgestimate-Ethinyl Estradiol Triphasic (ORTHO TRI-CYCLEN LO) 0.18/0.215/0.25 MG-25 MCG tab Take 1 tablet by mouth daily. 1 Package 11  . OLANZapine (ZYPREXA) 15 MG tablet Take 2 tablets (30 mg total) by mouth at bedtime. 60 tablet 1  . traZODone (DESYREL) 100 MG tablet  Take 1 tablet (100 mg total) by mouth at bedtime. 30 tablet 1    OB/GYN Status:  Patient's last menstrual period was 09/20/2017.  General Assessment Data [facility] of Assessment[facility]) TTS Assessment: In system Is this a Tele or Face-to-Face Assessment?: Face-to-Face Is this an Initial Assessment or a Re-assessment for this encounter?: Initial Assessment Marital status: Single Living Arrangements: Parent Can pt return to current living  arrangement?: Yes Admission Status: Involuntary Is patient capable of signing voluntary admission?: No(pt psychotic/delusional; unable to sign admission form) Referral Source: Self/Family/Friend Insurance type: self-pay  Medical Screening Exam [facility] Walk-in ONLY) Medical Exam completed: Yes  Crisis Care Plan Living Arrangements: Parent Legal Guardian: Other:(self) Name of Psychiatrist: pt denies Name of Therapist: pt denies  Education Status Is patient currently in school?: No Is the patient employed, unemployed or receiving disability?: Unemployed  Risk to self with the past 6 months Suicidal Ideation: No Has patient been a risk to self within the past 6 months prior to admission? : No Suicidal Intent: No Has patient had any suicidal intent within the past 6 months prior to admission? : No Is patient at risk for suicide?: No Suicidal Plan?: No Has patient had any suicidal plan within the past 6 months prior to admission? : No Access to Means: No What has been your use of drugs/alcohol within the last 12 months?: pt denies current use Previous Attempts/Gestures: No How many times?: 0 Other Self Harm Risks: pt denies Triggers for Past Attempts: None known Intentional Self Injurious Behavior: None Family Suicide History: Unknown Recent stressful life event(s): Other (Comment)(pt not taking her medication ) Persecutory voices/beliefs?: Yes Depression: Yes Depression Symptoms: Tearfulness, Isolating, Insomnia, Loss of interest in usual pleasures, Feeling worthless/self pity Substance abuse history and/or treatment for substance abuse?: No Suicide prevention information given to non-admitted patients: Not applicable  Risk to Others within the past 6 months Homicidal Ideation: No Does patient have any lifetime risk of violence toward others beyond the six months prior to admission? : No Thoughts of Harm to Others: No Current Homicidal Intent: No Current Homicidal Plan:  No Access to Homicidal Means: No Identified Victim: n/a History of harm to others?: No Assessment of Violence: None Noted Violent Behavior Description: note noted Does patient have access to weapons?: No Criminal Charges Pending?: No Does patient have a court date: No Is patient on probation?: No  Psychosis Hallucinations: None noted Delusions: None noted  Mental Status Report Appearance/Hygiene: Other (Comment)(patient dressed appropriate for weather, age) Eye Contact: Fair Motor Activity: Freedom of movement Speech: Soft Level of Consciousness: Crying, Other (Comment)(psychotic / delusional ) Mood: Suspicious, Helpless, Other (Comment)(paranoid, psychotic, delusional ) Affect: Inconsistent with thought content Anxiety Level: Minimal Thought Processes: Thought Blocking Judgement: Impaired Orientation: Place, Time, Person Obsessive Compulsive Thoughts/Behaviors: Minimal  Cognitive Functioning Concentration: Fair Memory: Recent Impaired, Remote Impaired Is patient IDD: No Is patient DD?: No Insight: Fair Impulse Control: Fair Appetite: Poor Have you had any weight changes? : No Change Sleep: Decreased Total Hours of Sleep: (pt mother report pt has not slept in 4 days) Vegetative Symptoms: None  ADLScreening [facility] Assessment Services) Patient's cognitive ability adequate to safely complete daily activities?: Yes Patient able to express need for assistance with ADLs?: Yes Independently performs ADLs?: Yes (appropriate for developmental age)  Prior Inpatient Therapy Prior Inpatient Therapy: Yes Prior Therapy Dates: 2019 Prior Therapy Facilty/Provider(s): [facility]  Reason for Treatment: mental health   Prior Outpatient Therapy Prior Outpatient Therapy: No Does patient have  an ACCT team?: No Does patient have Intensive In-House Services?  : No Does patient have [facility] services? : No Does patient have P4CC services?: No  ADL Screening (condition at time of  admission) Patient's cognitive ability adequate to safely complete daily activities?: Yes Is the patient deaf or have difficulty hearing?: No Does the patient have difficulty seeing, even when wearing glasses/contacts?: No Does the patient have difficulty concentrating, remembering, or making decisions?: No Patient able to express need for assistance with ADLs?: Yes Does the patient have difficulty dressing or bathing?: No Independently performs ADLs?: Yes (appropriate for developmental age) Does the patient have difficulty walking or climbing stairs?: No       Abuse/Neglect Assessment (Assessment to be complete while patient is alone) Physical Abuse: Denies Verbal Abuse: Yes, past (Comment) Sexual Abuse: Denies Exploitation of patient/patient's resources: Denies Self-Neglect: Denies     Merchant navy officer (For Healthcare) Does Patient Have a Medical Advance Directive?: No Would patient like information on creating a medical advance directive?: No - Patient declined    Additional Information 1:1 In Past 12 Months?: No CIRT Risk: No Elopement Risk: No Does patient have medical clearance?: No     Disposition:  Disposition Initial Assessment Completed for this Encounter: (Shuvon Rankin, NP, recommend inpt tx) Disposition of Patient: Admit(Shuvon Rankin, NP, recommend inpt tx ) Type of inpatient treatment program: Adult Patient refused recommended treatment: Yes(pt had to be IVC'd) Mode of transportation if patient is discharged?: Car  On Site Evaluation by:   Reviewed with Physician:    Dian Situ 10/17/2017 4:40 PM

## 2017-10-17 NOTE — H&P (Signed)
Behavioral Health Medical Screening Exam  Theresa [name] is an 22 y.o. female patient presents to [facility] as walk; brought in by her mother with complaints of auditory/visual hallucinations, wondering off; and not taking her medications.  Patient happy one minute and crying the next.  Patient has presented several times to ED and has had one recent inpatient admission at [facility] Oct 02, 2017.  Patient denies suicidal/homicidal ideation, psychosis, and paranoia but she is looking around the room and appear that she is listen and looking at something.    Total Time spent with patient: 45 minutes  Psychiatric Specialty Exam: Physical Exam  Vitals reviewed. Constitutional: She is oriented to person, place, and time. She appears well-developed and well-nourished.  HENT:  Head: Normocephalic.  Neck: Normal range of motion. Neck supple.  Respiratory: Effort normal.  Musculoskeletal: Normal range of motion.  Neurological: She is alert and oriented to person, place, and time.  Skin: Skin is warm and dry.    Review of Systems  Psychiatric/Behavioral: Positive for depression, hallucinations and substance abuse. Negative for suicidal ideas. The patient is nervous/anxious and has insomnia.   All other systems reviewed and are negative.   Last menstrual period 09/20/2017.There is no height or weight on file to calculate BMI.  General Appearance: Casual  Eye Contact:  Good  Speech:  Clear and Coherent and Normal Rate  Volume:  Normal  Mood:  Anxious and Depressed  Affect:  Depressed and Tearful  Thought Process:  Disorganized  Orientation:  Full (Time, Place, and Person)  Thought Content:  Hallucinations: Auditory Visual and Paranoid Ideation  Suicidal Thoughts:  No  Homicidal Thoughts:  No  Memory:  Immediate;   Fair Recent;   Fair Remote;   Fair  Judgement:  Impaired  Insight:  Lacking  Psychomotor Activity:  Decreased  Concentration: Concentration: Fair and Attention Span: Fair   Recall:  [employer] of Knowledge:Fair  Language: Good  Akathisia:  No  Handed:  Right  AIMS (if indicated):     Assets:  Communication Skills Desire for Improvement Housing Social Support Transportation  Sleep:       Musculoskeletal: Strength & Muscle Tone: within normal limits Gait & Station: normal Patient leans: N/A  Last menstrual period 09/20/2017.  Recommendations:  Inpatient psychiatric treatment.  Spoke with Dr Tamera Punt related to medications Start Invega 3 mg now and 3 mg hs then 3 mg daily After 3 days PO Invega Start Invega Sustenna 234 mg IM on day one; then 117 mg on day 8; then 117 mg monthly  Based on my evaluation the patient does not appear to have an emergency medical condition.  Shuvon Rankin, NP 10/17/2017, 3:23 PM

## 2017-10-17 NOTE — Progress Notes (Signed)
Admission Note: Patient is an 22 year old female presented as a walk-in accompanied by her mother.  Patient appears paranoid, fearful and guarded during admission process.  Patient was very anxious and tearful and was preoccupied throughout the admission process.  States she doesn't know why she is here.  Admission plan of care reviewed and consent signed after several encouragements.  Skin assessment and personal belonging searched.  Skin is dry and intact.  No contraband found.  Patient oriented to the unit, staff and room.  Routine safety checks initiated. Patient offered support and encouragement as needed.  Patient is safe on the unit. Refused medication, EKG and lab work.

## 2017-10-17 NOTE — Progress Notes (Signed)
Pt was observed in room, seen pacing. Pt presents with suspicious/fearful/anxious/depressed affect and mood. Pt denies SI/HI/AVH/Pain at this time. Pt states "I just want to go home". Pt refused labs/EKG/sceduled medications this evening provided with encouragement. Pt asked for a bible and proceeded to read. Will continue with POC.

## 2017-10-17 NOTE — [facility] Counselor (Signed)
Involuntary commitment paperwork completed by Dr Sabino Gasser. Writer faxed notarized paperwork to [employer] who acknowledged receipt of IVC. [facility] then faxed back to writer the completed custody order. Per new process, Clinical research associate calls [facility] non emergency to request officer come to [facility] for service only. Patient is being accepted on the [facility] unit. TTS waiting for officer to arrive to escort pt to unit as pt is delusional and there is some concern she will not walk to unit willingly.   Evette Cristal, [place] Therapeutic Triage Specialist

## 2017-10-17 NOTE — Progress Notes (Signed)
Urine specimen cup given. Pt verbalized understanding. Pending sample.

## 2017-10-18 ENCOUNTER — Other Ambulatory Visit: Payer: Self-pay

## 2017-10-18 DIAGNOSIS — F200 Paranoid schizophrenia: Secondary | ICD-10-CM

## 2017-10-18 DIAGNOSIS — F17210 Nicotine dependence, cigarettes, uncomplicated: Secondary | ICD-10-CM

## 2017-10-18 DIAGNOSIS — Z79899 Other long term (current) drug therapy: Secondary | ICD-10-CM

## 2017-10-18 DIAGNOSIS — Z9114 Patient's other noncompliance with medication regimen: Secondary | ICD-10-CM

## 2017-10-18 LAB — PREGNANCY, URINE: Preg Test, Ur: NEGATIVE

## 2017-10-18 LAB — URINALYSIS, COMPLETE (UACMP) WITH MICROSCOPIC
Bilirubin Urine: NEGATIVE
Glucose, UA: NEGATIVE mg/dL
Ketones, ur: NEGATIVE mg/dL
Leukocytes, UA: NEGATIVE
Nitrite: NEGATIVE
Protein, ur: NEGATIVE mg/dL
Specific Gravity, Urine: 1.02 (ref 1.005–1.030)
pH: 6 (ref 5.0–8.0)

## 2017-10-18 LAB — RAPID URINE DRUG SCREEN, HOSP PERFORMED
Amphetamines: NOT DETECTED
Barbiturates: NOT DETECTED
Benzodiazepines: NOT DETECTED
Cocaine: NOT DETECTED
Opiates: NOT DETECTED
Tetrahydrocannabinol: POSITIVE — AB

## 2017-10-18 NOTE — H&P (Signed)
Psychiatric Admission Assessment Adult  Patient Identification: Theresa [name] MRN:  [id number] Date of Evaluation:  10/18/2017 Chief Complaint:  schizophrenia Principal Diagnosis: <principal problem not specified> Diagnosis:   Patient Active Problem List   Diagnosis Date Noted  . Schizophrenia (HCC) [F20.9] 10/17/2017  . UTI (urinary tract infection) [N39.0] 09/28/2017  . Undifferentiated schizophrenia (HCC) [F20.3] 09/23/2017  . Bipolar affective disorder (HCC) [F31.9] 09/20/2017  . Bipolar affective disorder, mixed, severe, with psychotic behavior (HCC) [F31.64] 10/04/2016  . PCOS (polycystic ovarian syndrome) [E28.2] 01/18/2016  . Cannabis use disorder, moderate, dependence (HCC) [F12.20] 12/06/2015  . Tobacco use disorder [F17.200] 12/05/2015   History of Present Illness: per assessment note: Theresa [name] is an 22 y.o. female present to [facility] as a walk-in accompanied by her mother. Patient is experiencing symptoms of delusions, auditory hallucinations, crying episodes, impaired communication, and paranoia. Patient's mother report patient has been experiencing symptoms since April 2019, feels patient should have never been discharged from the hospital. Report patient has not slept in 4 days and her appetite has decreased. Patient has to be watched per mother or she will wonder off. Patient refuses to take her medication, visit the hospital or re-visit her psychiatrist. Mother report history of mental health on patient's biological father sides of the family. Patient denies suicidal / homicidal ideations and visual hallucinations. Patient admits to hear voices but does not disclose what the voices are saying to her. Patient is not connected with a psychiatrist or therapist. Patient refuses to attend appointments. Patient was hospitalized at [facility], 2019. Patient is unable to maintain employment due to interference of her mental health symptoms.    Evaluation: Patient is awake alert and oriented x3.  Patient presents guarded and tearful doing assessment.  Patient was evaluated by MD and NP.  patient is a poor historian and unable to articulate why she was here. Reports she was taken medications that was too strong, however is unable to recall what medications she was prescribed. Patient was evaluated by Attending Psychiatrist who initiated mediations at that time, for paranoid with disorganized behavior.   Per nursing staff patient continues to refuse medications, EKG and additional labs at this time.  Patient seen pacing the unit denies auditory or visual hallucinations.  Reports a mental health history and appears to be thought blocking with responses. See SRA for medication management.  Support and encouragement and reassurance.    Associated Signs/Symptoms: Depression Symptoms:  depressed mood, difficulty concentrating, anxiety, (Hypo) Manic Symptoms:  Distractibility, Irritable Mood, Anxiety Symptoms:  Excessive Worry, Psychotic Symptoms:  Hallucinations: Auditory Paranoia, PTSD Symptoms: NA Total Time spent with patient: 20 minutes  Past Psychiatric History:   Is the patient at risk to self? Yes.    Has the patient been a risk to self in the past 6 months? Yes.    Has the patient been a risk to self within the distant past? Yes.    Is the patient a risk to others? No.  Has the patient been a risk to others in the past 6 months? No.  Has the patient been a risk to others within the distant past? No.   Prior Inpatient Therapy: Prior Inpatient Therapy: Yes Prior Therapy Dates: 2019 Prior Therapy Facilty/Provider(s): [facility]  Reason for Treatment: mental health  Prior Outpatient Therapy: Prior Outpatient Therapy: No Does patient have an ACCT team?: No Does patient have Intensive In-House Services?  : No Does patient have [facility] services? : No Does patient have P4CC services?:  No  Alcohol Screening: Patient refused  Alcohol Screening Tool: Yes 1. How often do you have a drink containing alcohol?: 2 to 4 times a month 2. How many drinks containing alcohol do you have on a typical day when you are drinking?: 3 or 4 3. How often do you have six or more drinks on one occasion?: Less than monthly AUDIT-C Score: 4 4. How often during the last year have you found that you were not able to stop drinking once you had started?: Never 5. How often during the last year have you failed to do what was normally expected from you becasue of drinking?: Never 6. How often during the last year have you needed a first drink in the morning to get yourself going after a heavy drinking session?: Never 7. How often during the last year have you had a feeling of guilt of remorse after drinking?: Never 8. How often during the last year have you been unable to remember what happened the night before because you had been drinking?: Never 9. Have you or someone else been injured as a result of your drinking?: No 10. Has a relative or friend or a doctor or another health worker been concerned about your drinking or suggested you cut down?: No Alcohol Use Disorder Identification Test Final Score (AUDIT): 4 Intervention/Follow-up: AUDIT Score <7 follow-up not indicated Substance Abuse History in the last 12 months:  No. Consequences of Substance Abuse: NA Previous Psychotropic Medications: YES Psychological Evaluations: YES Past Medical History:  Past Medical History:  Diagnosis Date  . Abnormal vaginal Pap smear   . Anxiety   . Bipolar disorder (HCC)   . Depression   . Migraine without aura   . Polycystic ovary syndrome   . Psychosis (HCC)   . Schizophrenia (HCC)   . STD (sexually transmitted disease) 10-14-15   chlamydia    History reviewed. No pertinent surgical history. Family History:  Family History  Problem Relation Age of Onset  . Hypertension Mother   . Heart disease Mother   . Cancer Mother   . Hypertension  Other   . Depression Father   . Drug abuse Father   . Bipolar disorder Father   . Heart disease Maternal Grandmother   . Pancreatitis Maternal Grandfather   . Cancer Paternal Grandmother   . Cancer Paternal Grandfather    Family Psychiatric  History:  Tobacco Screening: Have you used any form of tobacco in the last 30 days? (Cigarettes, Smokeless Tobacco, Cigars, and/or Pipes): Yes Tobacco use, Select all that apply: 4 or less cigarettes per day Are you interested in Tobacco Cessation Medications?: Yes, will notify MD for an order Counseled patient on smoking cessation including recognizing danger situations, developing coping skills and basic information about quitting provided: Yes Social History:  Social History   Substance and Sexual Activity  Alcohol Use Yes  . Alcohol/week: 0.0 oz     Social History   Substance and Sexual Activity  Drug Use Yes  . Types: Marijuana   Comment: pt denies    Additional Social History: Marital status: Single    Pain Medications: see MAR Prescriptions: see MAR Over the Counter: see MAR History of alcohol / drug use?: Yes Longest period of sobriety (when/how long): 1 - month, patient denies substance use   Name of Substance 2: cigarettes 2 - Age of First Use: TEEN 2 - Amount (size/oz): 1/2 PACK  2 - Frequency: DAILY 2 - Duration: ONGOING 2 - Last Use /  Amount: unknown                Allergies:  No Known Allergies Lab Results: No results found for this or any previous visit (from the past 48 hour(s)).  Blood Alcohol level:  Lab Results  Component Value Date   ETH <10 09/21/2017   ETH <10 09/19/2017    Metabolic Disorder Labs:  Lab Results  Component Value Date   HGBA1C 5.2 09/25/2017   MPG 102.54 09/25/2017   MPG 105.41 09/24/2017   Lab Results  Component Value Date   PROLACTIN 4.0 10/14/2015   Lab Results  Component Value Date   CHOL 102 09/25/2017   TRIG 43 09/25/2017   HDL 41 09/25/2017   CHOLHDL 2.5  09/25/2017   VLDL 9 09/25/2017   LDLCALC 52 09/25/2017   LDLCALC 58 09/24/2017    Current Medications: Current Facility-Administered Medications  Medication Dose Route Frequency Provider Last Rate Last Dose  . albuterol (PROVENTIL HFA;VENTOLIN HFA) 108 (90 Base) MCG/ACT inhaler 1-2 puff  1-2 puff Inhalation Q6H PRN Rankin, Shuvon B, NP      . diphenhydrAMINE (BENADRYL) capsule 25 mg  25 mg Oral Q8H PRN Rankin, Shuvon B, NP       Or  . diphenhydrAMINE (BENADRYL) injection 25 mg  25 mg Intramuscular Q6H PRN Rankin, Shuvon B, NP      . haloperidol (HALDOL) tablet 1 mg  1 mg Oral Q6H PRN Rankin, Shuvon B, NP       Or  . haloperidol lactate (HALDOL) injection 1 mg  1 mg Intramuscular Q6H PRN Rankin, Shuvon B, NP      . hydrOXYzine (ATARAX/VISTARIL) tablet 25 mg  25 mg Oral TID PRN Rankin, Shuvon B, NP      . LORazepam (ATIVAN) tablet 1 mg  1 mg Oral Q6H PRN Rankin, Shuvon B, NP       Or  . LORazepam (ATIVAN) injection 1 mg  1 mg Intramuscular Q6H PRN Rankin, Shuvon B, NP      . paliperidone (INVEGA) 24 hr tablet 3 mg  3 mg Oral NOW Rankin, Shuvon B, NP      . paliperidone (INVEGA) 24 hr tablet 3 mg  3 mg Oral Once Rankin, Shuvon B, NP      . paliperidone (INVEGA) 24 hr tablet 3 mg  3 mg Oral Daily Rankin, Shuvon B, NP       PTA Medications: Medications Prior to Admission  Medication Sig Dispense Refill Last Dose  . albuterol (PROVENTIL HFA;VENTOLIN HFA) 108 (90 Base) MCG/ACT inhaler Inhale 1-2 puffs into the lungs every 6 (six) hours as needed for wheezing or shortness of breath. 1 Inhaler 0   . lamoTRIgine (LAMICTAL) 25 MG tablet Take 50 mg by mouth daily.   Not Taking at Unknown time  . OLANZapine (ZYPREXA) 15 MG tablet Take 30 mg by mouth at bedtime.   Not Taking at Unknown time  . traZODone (DESYREL) 100 MG tablet Take 100 mg by mouth at bedtime.   Not Taking at Unknown time    Musculoskeletal: Strength & Muscle Tone: within normal limits Gait & Station: normal Patient leans:  N/A  Psychiatric Specialty Exam: Physical Exam  Nursing note and vitals reviewed. Skin: Skin is warm and dry.    Review of Systems  Psychiatric/Behavioral: Positive for depression and hallucinations. The patient is nervous/anxious.   All other systems reviewed and are negative.   Blood pressure 128/82, pulse 73, temperature 98 F (36.7 C), temperature source Oral,  resp. rate 18, height  (1.626 m), weight 180 lb (81.6 kg), last menstrual period 09/20/2017, SpO2 99 %.Body mass index is 30.9 kg/m.  General Appearance: Casual and Guarded tearful   Eye Contact:  Fair  Speech:  Clear and Coherent  Volume:  Normal  Mood:  Depressed  Affect:  Depressed and Flat  Thought Process:  Disorganized  Orientation:  Full (Time, Place, and Person)  Thought Content:  Paranoid Ideation and Rumination  Suicidal Thoughts:  No  Homicidal Thoughts:  No  Memory:  Immediate;   Fair Recent;   Fair Remote;   Fair  Judgement:  Fair  Insight:  Fair  Psychomotor Activity:  Restlessness  Concentration:  Concentration: Fair  Recall:  [employer] of Knowledge:  Fair  Language:  Fair  Akathisia:  No  Handed:  Right  AIMS (if indicated):     Assets:  Communication Skills Desire for Improvement Resilience Social Support  ADL's:  Intact  Cognition:  WNL  Sleep:  Number of Hours: 6.75    Treatment Plan Summary: Daily contact with patient to assess and evaluate symptoms and progress in treatment and Medication management   See SRA for medications management   Observation Level/Precautions:  15 minute checks  Laboratory:  CBC Chemistry Profile UDS UA  Psychotherapy:  individual and group session  Medications:  See SRA  Consultations:  [name] and Psychiatry  Discharge Concerns:  Safety, stabilization, and risk of access to medication and medication stabilization   Estimated LOS: 5-7days  Other:     Physician Treatment Plan for Primary Diagnosis: <principal problem not specified> Long Term  Goal(s): Improvement in symptoms so as ready for discharge  Short Term Goals: Ability to verbalize feelings will improve and Compliance with prescribed medications will improve  Physician Treatment Plan for Secondary Diagnosis: Active Problems:   Schizophrenia (HCC)  Long Term Goal(s): Improvement in symptoms so as ready for discharge  Short Term Goals: Ability to identify changes in lifestyle to reduce recurrence of condition will improve, Ability to identify and develop effective coping behaviors will improve and Compliance with prescribed medications will improve  I certify that inpatient services furnished can reasonably be expected to improve the patient's condition.    Oneta Rack, NP 5/25/20199:05 AM

## 2017-10-18 NOTE — BHH Counselor (Signed)
Clinical Social Work Note  With written consent from pt, contacted mother [name] [phone] for collateral information.  Mother wants treatment team to know that pt has been in her present psychotic state since 4 days before her last hospitalization.  She will start taking on other people's voices and laughs.  In the same hour pt goes rapidly from laughing to crying and back.  She will sometimes become catatonic.  She thinks a demon invaded her at [facility].  First episode at 22yo, actually got married while psychotic.     Pt has been refusing to go to see Dr. Geanie Cooley at [facility] so had no follow-up after her last hospital discharge.  Mother fears a premature discharge, states pt can act as though she is okay in order to get out of the hospital.    Mother is worried because pt wanders off in the middle of the night.  She will not sleep.  In the 4 days prior to hospitalization, she slept 1-1/2 hours.  [facility] came out and they convinced her to take one Trazodone but it had the opposite effect, "jacked her up."  Mother believes she needs different medicine.  Mother states that patient refuses to take her medicine daily and mother gets quite excited to find out that her new medicine Hinda Glatter could be available in an injection.  Pt has gained a great deal of weight from Seroquel and Lamictal.  Trazodone had the opposite effect as that desired.  An Abilify injection made her very stiff.    Mother confirmed that pt has PCOS, as reported.  She recently had her period for 21 straight days.  She has birth control but refuses to take it.    Paternal grandmother, aunt, and uncles all have Schizophrenia, one of them with catatonia.  She keeps starting and quitting jobs due to her symptoms, so cannot keep insurance.  Mother could confirm that pt has used marijuana and has been told she has used methamphetamines, but the heroin reported by pt is unknown to mother and she highly doubts  it.  Dr. Jennet Maduro at [facility] mentioned to mother at last hospitalization that there are two places in [place] where people with Schizophrenia can live and receive treatment, is interested in talking about those. Number of LCSW assigned to pt was given to mother.  Ambrose Mantle, LCSW 10/18/2017, 3:38 PM

## 2017-10-18 NOTE — Progress Notes (Signed)
Patient ID: Theresa [name], female   DOB: [birth year], 22 y.o.   MRN: [id number]    D: Pt has been very flat and depressed on the unit today. Pt did not interact with peers or staff. Pt was very isolative and withdrawn. Pt wrote on her patient self inventory sheet that her depression was a 1, her hopelessness was a 0, and her anxiety was a 0. Pt documented that her goal for today was to go home. Pt took all medications as prescribed by the doctor.  Pt reported being negative SI/HI, no AH/VH noted. A: 15 min checks continued for patient safety. R: Pt safety maintained.
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
## 2017-10-18 NOTE — [facility] Group Notes (Signed)
[facility] Group Notes: (Clinical Social Work)   10/18/2017      Type of Therapy:  Group Therapy   Participation Level:  Did Not Attend - [name] was asked not to awaken for group   Reeanna Acri Grossman-Orr, LCSW 10/18/2017, 12:17 PM 

## 2017-10-18 NOTE — [facility] Suicide Risk Assessment (Signed)
[facility] INPATIENT:  Family/Significant Other Suicide Prevention Education  Suicide Prevention Education:   Patient was not suicidal at the time of admission, so SPE is not needed.  However, collateral contact with mother [name] [phone] was made.  Please see counselor note this date.   Carloyn Jaeger Grossman-Orr 10/18/2017, 4:14 PM

## 2017-10-19 DIAGNOSIS — F209 Schizophrenia, unspecified: Secondary | ICD-10-CM

## 2017-10-19 DIAGNOSIS — Z818 Family history of other mental and behavioral disorders: Secondary | ICD-10-CM

## 2017-10-19 DIAGNOSIS — F1290 Cannabis use, unspecified, uncomplicated: Secondary | ICD-10-CM

## 2017-10-19 DIAGNOSIS — R45851 Suicidal ideations: Secondary | ICD-10-CM

## 2017-10-19 DIAGNOSIS — G4700 Insomnia, unspecified: Secondary | ICD-10-CM

## 2017-10-19 LAB — ETHANOL: Alcohol, Ethyl (B): <10 mg/dL (ref <10)

## 2017-10-19 LAB — CBC
HCT: 39.9 % (ref 36.0–46.0)
Hemoglobin: 13.5 g/dL (ref 12.0–15.0)
MCH: 31.5 pg (ref 26.0–34.0)
MCHC: 33.8 g/dL (ref 30.0–36.0)
MCV: 93 fL (ref 78.0–100.0)
Platelets: 337 10*3/uL (ref 150–400)
RBC: 4.29 MIL/uL (ref 3.87–5.11)
RDW: 12.7 % (ref 11.5–15.5)
WBC: 7.9 10*3/uL (ref 4.0–10.5)

## 2017-10-19 LAB — COMPREHENSIVE METABOLIC PANEL
ALT: 11 U/L — ABNORMAL LOW (ref 14–54)
AST: 12 U/L — ABNORMAL LOW (ref 15–41)
Albumin: 4.2 g/dL (ref 3.5–5.0)
Alkaline Phosphatase: 81 U/L (ref 38–126)
Anion gap: 11 (ref 5–15)
BUN: 14 mg/dL (ref 6–20)
CO2: 25 mmol/L (ref 22–32)
Calcium: 9 mg/dL (ref 8.9–10.3)
Chloride: 107 mmol/L (ref 101–111)
Creatinine, Ser: 0.82 mg/dL (ref 0.44–1.00)
GFR calc Af Amer: >60 mL/min (ref >60)
GFR calc non Af Amer: >60 mL/min (ref >60)
Glucose, Bld: 86 mg/dL (ref 65–99)
Potassium: 3.5 mmol/L (ref 3.5–5.1)
Sodium: 143 mmol/L (ref 135–145)
Total Bilirubin: 0.3 mg/dL (ref 0.3–1.2)
Total Protein: 7.1 g/dL (ref 6.5–8.1)

## 2017-10-19 LAB — TSH: TSH: 2.821 u[IU]/mL (ref 0.350–4.500)

## 2017-10-19 LAB — LIPID PANEL
Cholesterol: 139 mg/dL (ref 0–200)
HDL: 43 mg/dL (ref >40)
LDL Cholesterol: 86 mg/dL (ref 0–99)
Total CHOL/HDL Ratio: 3.2 RATIO
Triglycerides: 52 mg/dL (ref <150)
VLDL: 10 mg/dL (ref 0–40)

## 2017-10-19 LAB — HEMOGLOBIN A1C
Hgb A1c MFr Bld: 5.1 % (ref 4.8–5.6)
Mean Plasma Glucose: 99.67 mg/dL

## 2017-10-19 MED ORDER — TRAZODONE HCL 50 MG PO TABS
50.0000 mg | ORAL_TABLET | Freq: Every evening | ORAL | Status: DC | PRN
  Filled 2017-10-19 (×6): qty 1

## 2017-10-19 MED ORDER — HYDROXYZINE HCL 25 MG PO TABS: 25.0000 mg | ORAL_TABLET | Freq: Three times a day (TID) | ORAL | Status: DC | PRN

## 2017-10-19 NOTE — [facility] Group Notes (Signed)
[facility] Group Notes:  (Nursing/MHT/Case Management/Adjunct)  Date:  10/19/2017  Time:  11:27 AM  Type of Therapy:  Goals/Orientation Group.  Participation Level:  Active  Participation Quality:  Appropriate  Affect:  Appropriate  Cognitive:  Appropriate  Insight:  Appropriate  Engagement in Group:  Engaged  Modes of Intervention:  Discussion  Summary of Progress/Problems: Pt attended goals/orientation group, pt was receptive.  Jacquelyne Balint Shanta 10/19/2017, 11:27 AM

## 2017-10-19 NOTE — Progress Notes (Signed)
Pt was observed in dayroom, attending wrap-up group. Pt presents with anxious/depressed affect and mood. Pt denies SI/HI/AVH/Pain at this time. Pt states "I had a better day; I still want to go home". Pt participation in programming is improving. Pt appears to be more incline to take meds. Pt's mother came to visit this evening and encourage her to eat dinner. Will continue with POC.

## 2017-10-19 NOTE — [facility] Group Notes (Signed)
[facility] Group Notes:  (Nursing/MHT/Case Management/Adjunct)  Date:  10/19/2017  Time:  11:28 AM  Type of Therapy:  Psychoeducational Skills  Participation Level:  Active  Participation Quality:  Appropriate  Affect:  Appropriate  Cognitive:  Appropriate  Insight:  Appropriate  Engagement in Group:  Engaged  Modes of Intervention:  Problem-solving  Summary of Progress/Problems: Pt attended Psychoeducational group with top topic healthy support systems.  Jacquelyne Balint Shanta 10/19/2017, 11:28 AM

## 2017-10-19 NOTE — Progress Notes (Addendum)
[facility] MD Progress Note  10/19/2017 1:49 PM Theresa [name]  MRN:  [id number]   Subjective:   Theresa [name] is awake alert and oriented x3.  Seen  pacing the unit.  Presents irritable and guarded during this assessment.  Theresa [name] is requesting to be discharged as she reports her mood is improved and states is she is feeling better.  Per nursing staff patient continues to need a lot of encouragement with taking medications.  Patient denies auditory or visual hallucinations however reports hearing  A "sound" inside of her head. Patient denies medication side effects. Reports patient mother visited on 5/25 and feels that she is not better and is requesting that patient not be discharged soon.  Patient reports resting well.  Denies depression or depressive symptoms. Chart and labs was reviewed, TSH 2.821. -Support, encouragement and reassurance was provided.    History: Theresa Reber Coffman-Gilbertis an 22 y.o.femalepresent to [employer] as a walk-in accompanied by her mother. Patient is experiencing symptoms of delusions, auditory hallucinations, crying episodes, impaired communication, and paranoia. Patient's mother report patient has been experiencing symptoms since April 2019, feels patient should have never been discharged from the hospital. Report patient has not slept in 4 days and her appetite has decreased. Patient has to be watched per mother or she will wonder off. Patient refuses to take her medication, visit the hospital or re-visit her psychiatrist. Mother report history of mental health on patient's biological father sides of the family.Patient denies suicidal / homicidal ideations and visual hallucinations. Patient admits to hear voices but does not disclose what the voices are saying to her. Patient is not connected with a psychiatrist or therapist. Patient refuses to attend appointments. Patient was hospitalized at [facility], 2019. Patient is unable to maintain employment due to  interference of her mental health symptoms    Principal Problem: Schizophrenia ([place]) Diagnosis:   Patient Active Problem List   Diagnosis Date Noted  . Schizophrenia ([place]) [F20.9] 10/17/2017  . UTI (urinary tract infection) [N39.0] 09/28/2017  . Undifferentiated schizophrenia ([place]) [F20.3] 09/23/2017  . Bipolar affective disorder ([place]) [F31.9] 09/20/2017  . Bipolar affective disorder, mixed, severe, with psychotic behavior ([place]) [F31.64] 10/04/2016  . PCOS (polycystic ovarian syndrome) [E28.2] 01/18/2016  . Cannabis use disorder, moderate, dependence ([place]) [F12.20] 12/06/2015  . Tobacco use disorder [F17.200] 12/05/2015   Total Time spent with patient: 15 minutes  Past Psychiatric History:   Past Medical History:  Past Medical History:  Diagnosis Date  . Abnormal vaginal Pap smear   . Anxiety   . Bipolar disorder ([place])   . Depression   . Migraine without aura   . Polycystic ovary syndrome   . Psychosis ([place])   . Schizophrenia ([place])   . STD (sexually transmitted disease) 10-14-15   chlamydia    History reviewed. No pertinent surgical history. Family History:  Family History  Problem Relation Age of Onset  . Hypertension Mother   . Heart disease Mother   . Cancer Mother   . Hypertension Other   . Depression Father   . Drug abuse Father   . Bipolar disorder Father   . Heart disease Maternal Grandmother   . Pancreatitis Maternal Grandfather   . Cancer Paternal Grandmother   . Cancer Paternal Grandfather    Family Psychiatric  History:  Social History:  Social History   Substance and Sexual Activity  Alcohol Use Yes  . Alcohol/week: 0.0 oz     Social History   Substance and Sexual Activity  Drug  Use Yes  . Types: Marijuana   Comment: pt denies    Social History   Socioeconomic History  . Marital status: Married    Spouse name: Not on file  . Number of children: Not on file  . Years of education: Not on file  . Highest education level: Not on file   Occupational History  . Not on file  Social Needs  . Financial resource strain: Not on file  . Food insecurity:    Worry: Not on file    Inability: Not on file  . Transportation needs:    Medical: Not on file    Non-medical: Not on file  Tobacco Use  . Smoking status: Current Every Day Smoker    Packs/day: 0.50    Types: Cigarettes  . Smokeless tobacco: Never Used  Substance and Sexual Activity  . Alcohol use: Yes    Alcohol/week: 0.0 oz  . Drug use: Yes    Types: Marijuana    Comment: pt denies  . Sexual activity: Not Currently    Partners: Male    Birth control/protection: None  Lifestyle  . Physical activity:    Days per week: Not on file    Minutes per session: Not on file  . Stress: Not on file  Relationships  . Social connections:    Talks on phone: Not on file    Gets together: Not on file    Attends religious service: Not on file    Active member of club or organization: Not on file    Attends meetings of clubs or organizations: Not on file    Relationship status: Not on file  Other Topics Concern  . Not on file  Social History Narrative  . Not on file   Additional Social History:    Pain Medications: see MAR Prescriptions: see MAR Over the Counter: see MAR History of alcohol / drug use?: Yes Longest period of sobriety (when/how long): 1 - month, patient denies substance use   Name of Substance 2: cigarettes 2 - Age of First Use: TEEN 2 - Amount (size/oz): 1/2 PACK  2 - Frequency: DAILY 2 - Duration: ONGOING 2 - Last Use / Amount: unknown                Sleep: Fair  Appetite:  Fair  Current Medications: Current Facility-Administered Medications  Medication Dose Route Frequency Provider Last Rate Last Dose  . albuterol (PROVENTIL HFA;VENTOLIN HFA) 108 (90 Base) MCG/ACT inhaler 1-2 puff  1-2 puff Inhalation Q6H PRN Rankin, Shuvon B, NP      . haloperidol (HALDOL) tablet 1 mg  1 mg Oral Q6H PRN Rankin, Shuvon B, NP       Or  .  haloperidol lactate (HALDOL) injection 1 mg  1 mg Intramuscular Q6H PRN Rankin, Shuvon B, NP      . hydrOXYzine (ATARAX/VISTARIL) tablet 25 mg  25 mg Oral TID PRN Rankin, Shuvon B, NP   25 mg at 10/18/17 2052  . LORazepam (ATIVAN) tablet 1 mg  1 mg Oral Q6H PRN Rankin, Shuvon B, NP       Or  . LORazepam (ATIVAN) injection 1 mg  1 mg Intramuscular Q6H PRN Rankin, Shuvon B, NP      . paliperidone (INVEGA) 24 hr tablet 3 mg  3 mg Oral Once Rankin, Shuvon B, NP      . paliperidone (INVEGA) 24 hr tablet 3 mg  3 mg Oral Daily Rankin, Shuvon B, NP   3  mg at 10/19/17 0828  . traZODone (DESYREL) tablet 50 mg  50 mg Oral QHS,MR X 1 Derrill Center, NP        Lab Results:  Results for orders placed or performed during the hospital encounter of 10/17/17 (from the past 48 hour(s))  Urinalysis, Complete w Microscopic     Status: Abnormal   Collection Time: 10/18/17  9:06 AM  Result Value Ref Range   Color, Urine YELLOW YELLOW   APPearance CLOUDY (A) CLEAR   Specific Gravity, Urine 1.020 1.005 - 1.030   pH 6.0 5.0 - 8.0   Glucose, UA NEGATIVE NEGATIVE mg/dL   Hgb urine dipstick LARGE (A) NEGATIVE   Bilirubin Urine NEGATIVE NEGATIVE   Ketones, ur NEGATIVE NEGATIVE mg/dL   Protein, ur NEGATIVE NEGATIVE mg/dL   Nitrite NEGATIVE NEGATIVE   Leukocytes, UA NEGATIVE NEGATIVE   RBC / HPF 0-5 0 - 5 RBC/hpf   WBC, UA 0-5 0 - 5 WBC/hpf   Bacteria, UA RARE (A) NONE SEEN   Squamous Epithelial / LPF 6-10 0 - 5   Mucus PRESENT     Comment: Performed at [facility], [place] [address] [place], [place] [address]  Pregnancy, urine     Status: None   Collection Time: 10/18/17  9:06 AM  Result Value Ref Range   Preg Test, Ur NEGATIVE NEGATIVE    Comment:        THE SENSITIVITY OF THIS METHODOLOGY IS >20 mIU/mL. Performed at [facility], [place] [address] [place], [place] [address]   Urine rapid drug screen (hosp performed)not at [facility]     Status: Abnormal   Collection Time:  10/18/17  9:06 AM  Result Value Ref Range   Opiates NONE DETECTED NONE DETECTED   Cocaine NONE DETECTED NONE DETECTED   Benzodiazepines NONE DETECTED NONE DETECTED   Amphetamines NONE DETECTED NONE DETECTED   Tetrahydrocannabinol POSITIVE (A) NONE DETECTED   Barbiturates NONE DETECTED NONE DETECTED    Comment: (NOTE) DRUG SCREEN FOR MEDICAL PURPOSES ONLY.  IF CONFIRMATION IS NEEDED FOR ANY PURPOSE, NOTIFY LAB WITHIN 5 DAYS. LOWEST DETECTABLE LIMITS FOR URINE DRUG SCREEN Drug Class                     Cutoff (ng/mL) Amphetamine and metabolites    1000 Barbiturate and metabolites    200 Benzodiazepine                 952 Tricyclics and metabolites     300 Opiates and metabolites        300 Cocaine and metabolites        300 THC                            50 Performed at [facility], [place] [address], [place], [place] [address]   Lipid panel     Status: None   Collection Time: 10/19/17  6:33 AM  Result Value Ref Range   Cholesterol 139 0 - 200 mg/dL   Triglycerides 52 <150 mg/dL   HDL 43 >40 mg/dL   Total CHOL/HDL Ratio 3.2 RATIO   VLDL 10 0 - 40 mg/dL   LDL Cholesterol 86 0 - 99 mg/dL    Comment:        Total Cholesterol/HDL:CHD Risk Coronary Heart Disease Risk Table  Men   Women  1/2 Average Risk   3.4   3.3  Average Risk       5.0   4.4  2 X Average Risk   9.6   7.1  3 X Average Risk  23.4   11.0        Use the calculated Patient Ratio above and the CHD Risk Table to determine the patient's CHD Risk.        ATP III CLASSIFICATION (LDL):  <100     mg/dL   Optimal  100-129  mg/dL   Near or Above                    Optimal  130-159  mg/dL   Borderline  160-189  mg/dL   High  >190     mg/dL   Very High Performed at [facility] [place] [address], [place], [place] [address]   Hemoglobin A1c     Status: None   Collection Time: 10/19/17  6:33 AM  Result Value Ref Range   Hgb A1c MFr Bld 5.1 4.8 - 5.6 %     Comment: (NOTE) Pre diabetes:          5.7%-6.4% Diabetes:              >6.4% Glycemic control for   <7.0% adults with diabetes    Mean Plasma Glucose 99.67 mg/dL    Comment: Performed at [facility] [address] [place], [place] [address]  Ethanol     Status: None   Collection Time: 10/19/17  6:33 AM  Result Value Ref Range   Alcohol, Ethyl (B) <10 <10 mg/dL    Comment: (NOTE) Lowest detectable limit for serum alcohol is 10 mg/dL. For medical purposes only. Performed at [facility], [place] [address], [place], [place] [address]   TSH     Status: None   Collection Time: 10/19/17  6:33 AM  Result Value Ref Range   TSH 2.821 0.350 - 4.500 uIU/mL    Comment: Performed by a 3rd Generation assay with a functional sensitivity of <=0.01 uIU/mL. Performed at [facility], [place] [address] [place],  [address]   Comprehensive metabolic panel     Status: Abnormal   Collection Time: 10/19/17  6:33 AM  Result Value Ref Range   Sodium 143 135 - 145 mmol/L   Potassium 3.5 3.5 - 5.1 mmol/L   Chloride 107 101 - 111 mmol/L   CO2 25 22 - 32 mmol/L   Glucose, Bld 86 65 - 99 mg/dL   BUN 14 6 - 20 mg/dL   Creatinine, Ser 0.82 0.44 - 1.00 mg/dL   Calcium 9.0 8.9 - 10.3 mg/dL   Total Protein 7.1 6.5 - 8.1 g/dL   Albumin 4.2 3.5 - 5.0 g/dL   AST 12 (L) 15 - 41 U/L   ALT 11 (L) 14 - 54 U/L   Alkaline Phosphatase 81 38 - 126 U/L   Total Bilirubin 0.3 0.3 - 1.2 mg/dL   GFR calc non Af Amer >60 >60 mL/min   GFR calc Af Amer >60 >60 mL/min    Comment: (NOTE) The eGFR has been calculated using the CKD EPI equation. This calculation has not been validated in all clinical situations. eGFR's persistently <60 mL/min signify possible Chronic Kidney Disease.    Anion gap 11 5 - 15    Comment: Performed at [facility], [place] [address]  [place], [place] [address]  CBC     Status: None   Collection Time: 10/19/17  6:33 AM  Result  Value Ref Range   WBC 7.9 4.0 - 10.5 K/uL   RBC 4.29 3.87 - 5.11 MIL/uL   Hemoglobin 13.5 12.0 - 15.0 g/dL   HCT 39.9 36.0 - 46.0 %   MCV 93.0 78.0 - 100.0 fL   MCH 31.5 26.0 - 34.0 pg   MCHC 33.8 30.0 - 36.0 g/dL   RDW 12.7 11.5 - 15.5 %   Platelets 337 150 - 400 K/uL    Comment: Performed at [facility], [place] [address] [place], [place] [address]    Blood Alcohol level:  Lab Results  Component Value Date   ETH <10 10/19/2017   ETH <10 61/95/0932    Metabolic Disorder Labs: Lab Results  Component Value Date   HGBA1C 5.1 10/19/2017   MPG 99.67 10/19/2017   MPG 102.54 09/25/2017   Lab Results  Component Value Date   PROLACTIN 4.0 10/14/2015   Lab Results  Component Value Date   CHOL 139 10/19/2017   TRIG 52 10/19/2017   HDL 43 10/19/2017   CHOLHDL 3.2 10/19/2017   VLDL 10 10/19/2017   LDLCALC 86 10/19/2017   LDLCALC 52 09/25/2017    Physical Findings: AIMS: Facial and Oral Movements Muscles of Facial Expression: None, normal Lips and Perioral Area: None, normal Jaw: None, normal Tongue: None, normal,Extremity Movements Upper (arms, wrists, hands, fingers): None, normal Lower (legs, knees, ankles, toes): None, normal, Trunk Movements Neck, shoulders, hips: None, normal, Overall Severity Severity of abnormal movements (highest score from questions above): None, normal Incapacitation due to abnormal movements: None, normal Patient's awareness of abnormal movements (rate only patient's report): No Awareness, Dental Status Current problems with teeth and/or dentures?: No Does patient usually wear dentures?: No  CIWA:    COWS:     Musculoskeletal: Strength & Muscle Tone: within normal limits Gait & Station: normal Patient leans: N/A  Psychiatric Specialty Exam: Physical Exam  Vitals reviewed. Constitutional: She is oriented to person, place, and time. She appears well-developed.  Cardiovascular: Normal rate.  Neurological: She is alert  and oriented to person, place, and time.  Psychiatric: She has a normal mood and affect. Her behavior is normal.    Review of Systems  Psychiatric/Behavioral: Positive for depression and suicidal ideas. The patient is nervous/anxious.   All other systems reviewed and are negative.   Blood pressure 102/69, pulse 79, temperature 98.1 F (36.7 C), temperature source Oral, resp. rate 18, height '5\' 4"'  (1.626 m), weight 81.6 kg (180 lb), last menstrual period 09/20/2017, SpO2 99 %.Body mass index is 30.9 kg/m.  General Appearance: Casual and Guarded  Eye Contact:  Fair  Speech:  Clear and Coherent  Volume:  Normal  Mood:  Anxious and Depressed  Affect:  Depressed and Restricted  Thought Process:  Coherent  Orientation:  Full (Time, Place, and Person)  Thought Content:  Paranoid Ideation and Rumination  Suicidal Thoughts:  No  Homicidal Thoughts:  No  Memory:  Immediate;   Fair Recent;   Fair Remote;   Fair  Judgement:  Fair  Insight:  Fair  Psychomotor Activity:  Normal  Concentration:  Concentration: Fair  Recall:  [employer] of Knowledge:  Fair  Language:  Fair  Akathisia:  No  Handed:  Right  AIMS (if indicated):     Assets:  Communication Skills Desire for Improvement Financial Resources/Insurance Social Support Talents/Skills  ADL's:  Intact  Cognition:  WNL  Sleep:  Number of Hours: 6     Treatment Plan Summary: Daily contact with patient to assess and evaluate symptoms and progress in treatment and Medication management   Continue with current treatment plan on 10/19/2017 as listed below expect where noted  Mood stabilization:  Continue with Invega 3 mood stabilization.  Insomnia:  Continue with Trazodone 50 mg   Agitation protocol:  Continue Haldol 1 PO/ IM- Ativan 1 mg PO PRN   Will continue to monitor vitals ,medication compliance and treatment side effects while patient is here.   Reviewed labs: ,BAL - , UDS + THC, urin pregnancy test is neg  [name]  will start working on disposition.  Patient to participate in therapeutic milieu  Derrill Center, NP 10/19/2017, 1:49 PM

## 2017-10-19 NOTE — Progress Notes (Signed)
Patient ID: Theresa [name], female   DOB: 1995/08/02, 22 y.o.   MRN: [id number]     D: Pt has been very flat and depressed on the unit today. Pt did not interact with peers or staff. Pt was very isolative and withdrawn. Pt wrote onherpatient self inventory sheet that herdepression was a 0, herhopelessness was a 0, and heranxiety was a 0. Pt documented that hergoal for today was to attend group and get discharged. Pt took all medications as prescribed by the doctor. Pt reported being negative SI/HI, no AH/VH noted. A: 15 min checks continued for patient safety. R: Pt safety maintained.

## 2017-10-19 NOTE — Progress Notes (Addendum)
Pt's mother called this evening. Code was given. Name is on consent sheet. Update given. Mother states she does not want Pt home until "she is herself".

## 2017-10-19 NOTE — [facility] Group Notes (Signed)
[facility] Group Notes: (Clinical Social Work)   10/19/2017      Type of Therapy:  Group Therapy   Participation Level:  Did Not Attend despite MHT prompting - she did arrive about 15 minutes before group ended but left again within 10 minutes.  She smiles but did not respond to any music.   Ambrose Mantle, LCSW 10/19/2017, 2:41 PM

## 2017-10-19 NOTE — Progress Notes (Addendum)
Pt was observed at [facility], seen interacting with a peer and pacing. Pt presents with anxious/irritable affect and mood. Pt denies SI/HI/AVH/Pain at this time. Pt states "I'm fine; I just want to go home". Pt appears to be more irritable with increase suspiciousness this evening. Pt is restless in bed and go through periods of crying spells. Vistaril offered and Pt accepted. Pt refused scheduled trazodone.Will continue with POC.

## 2017-10-20 DIAGNOSIS — Z813 Family history of other psychoactive substance abuse and dependence: Secondary | ICD-10-CM

## 2017-10-20 DIAGNOSIS — F250 Schizoaffective disorder, bipolar type: Principal | ICD-10-CM

## 2017-10-20 LAB — PROLACTIN: Prolactin: 73.5 ng/mL — ABNORMAL HIGH (ref 4.8–23.3)

## 2017-10-20 MED ORDER — PALIPERIDONE PALMITATE ER 156 MG/ML IM SUSY
156.0000 mg | PREFILLED_SYRINGE | INTRAMUSCULAR | Status: DC
  Administered 2017-10-23: 156 mg via INTRAMUSCULAR
  Filled 2017-10-20: qty 1

## 2017-10-20 MED ORDER — HALOPERIDOL 5 MG PO TABS: 5.0000 mg | ORAL_TABLET | Freq: Four times a day (QID) | ORAL | Status: DC | PRN

## 2017-10-20 MED ORDER — PALIPERIDONE PALMITATE ER 234 MG/1.5ML IM SUSY
234.0000 mg | PREFILLED_SYRINGE | Freq: Once | INTRAMUSCULAR | Status: AC
  Administered 2017-10-20: 234 mg via INTRAMUSCULAR
  Filled 2017-10-20: qty 1.5

## 2017-10-20 MED ORDER — PALIPERIDONE ER 6 MG PO TB24
6.0000 mg | ORAL_TABLET | Freq: Every day | ORAL | Status: DC
  Filled 2017-10-20 (×2): qty 1

## 2017-10-20 MED ORDER — HALOPERIDOL LACTATE 5 MG/ML IJ SOLN: 5.0000 mg | Freq: Four times a day (QID) | INTRAMUSCULAR | Status: DC | PRN

## 2017-10-20 NOTE — Progress Notes (Signed)
[facility] MD Progress Note  10/20/2017 2:54 PM Theresa [name]  MRN:  [id number] Subjective:    Theresa [name] is a 22 y/o F with history of schizoaffective disorder bipolar type who was admitted as a walk in with worsening symptoms of AH, delusions, paranoia, mood lability, no sleep for 4 days prior to admission, and episodes resembling catatonia. Pt has recent history of discharge from inpatient psychiatry unit at [facility] on 10/02/17. Pt had poor compliance with her discharge medications, and she was started on trial of Invega during this admission. Pt has demonstrated incremental improvement of her presenting symptoms during her admission.  Today upon evaluation, pt shares, "I came in here to get regulated on medications. I'm always worried about what's going on around me. I didn't feel safe at home." Pt also details that she has been sleeping poorly prior to her admission. She slept well last night. She denies SI/HI/AH/VH. She has been tolerating her medications well, and she denies any side effects. Discussed with patient about transitioning to long-acting injectable form of [place] due to history of poor adherence, and pt was in agreement. She was in agreement with the above treatment plan, and she had no further questions, comments, or concerns.   Principal Problem: Schizophrenia ([place]) Diagnosis:   Patient Active Problem List   Diagnosis Date Noted  . Schizophrenia ([place]) [F20.9] 10/17/2017  . UTI (urinary tract infection) [N39.0] 09/28/2017  . Undifferentiated schizophrenia ([place]) [F20.3] 09/23/2017  . Bipolar affective disorder ([place]) [F31.9] 09/20/2017  . Bipolar affective disorder, mixed, severe, with psychotic behavior ([place]) [F31.64] 10/04/2016  . PCOS (polycystic ovarian syndrome) [E28.2] 01/18/2016  . Cannabis use disorder, moderate, dependence ([place]) [F12.20] 12/06/2015  . Tobacco use disorder [F17.200] 12/05/2015   Total Time spent with  patient: 30 minutes  Past Psychiatric History: see H&P  Past Medical History:  Past Medical History:  Diagnosis Date  . Abnormal vaginal Pap smear   . Anxiety   . Bipolar disorder ([place])   . Depression   . Migraine without aura   . Polycystic ovary syndrome   . Psychosis ([place])   . Schizophrenia ([place])   . STD (sexually transmitted disease) 10-14-15   chlamydia    History reviewed. No pertinent surgical history. Family History:  Family History  Problem Relation Age of Onset  . Hypertension Mother   . Heart disease Mother   . Cancer Mother   . Hypertension Other   . Depression Father   . Drug abuse Father   . Bipolar disorder Father   . Heart disease Maternal Grandmother   . Pancreatitis Maternal Grandfather   . Cancer Paternal Grandmother   . Cancer Paternal Grandfather    Family Psychiatric  History: see H&P Social History:  Social History   Substance and Sexual Activity  Alcohol Use Yes  . Alcohol/week: 0.0 oz     Social History   Substance and Sexual Activity  Drug Use Yes  . Types: Marijuana   Comment: pt denies    Social History   Socioeconomic History  . Marital status: Married    Spouse name: Not on file  . Number of children: Not on file  . Years of education: Not on file  . Highest education level: Not on file  Occupational History  . Not on file  Social Needs  . Financial resource strain: Not on file  . Food insecurity:    Worry: Not on file    Inability: Not on file  .  Transportation needs:    Medical: Not on file    Non-medical: Not on file  Tobacco Use  . Smoking status: Current Every Day Smoker    Packs/day: 0.50    Types: Cigarettes  . Smokeless tobacco: Never Used  Substance and Sexual Activity  . Alcohol use: Yes    Alcohol/week: 0.0 oz  . Drug use: Yes    Types: Marijuana    Comment: pt denies  . Sexual activity: Not Currently    Partners: Male    Birth control/protection: None  Lifestyle  . Physical activity:    Days per  week: Not on file    Minutes per session: Not on file  . Stress: Not on file  Relationships  . Social connections:    Talks on phone: Not on file    Gets together: Not on file    Attends religious service: Not on file    Active member of club or organization: Not on file    Attends meetings of clubs or organizations: Not on file    Relationship status: Not on file  Other Topics Concern  . Not on file  Social History Narrative  . Not on file   Additional Social History:    Pain Medications: see MAR Prescriptions: see MAR Over the Counter: see MAR History of alcohol / drug use?: Yes Longest period of sobriety (when/how long): 1 - month, patient denies substance use   Name of Substance 2: cigarettes 2 - Age of First Use: TEEN 2 - Amount (size/oz): 1/2 PACK  2 - Frequency: DAILY 2 - Duration: ONGOING 2 - Last Use / Amount: unknown                Sleep: Good  Appetite:  Good  Current Medications: Current Facility-Administered Medications  Medication Dose Route Frequency Provider Last Rate Last Dose  . albuterol (PROVENTIL HFA;VENTOLIN HFA) 108 (90 Base) MCG/ACT inhaler 1-2 puff  1-2 puff Inhalation Q6H PRN Rankin, Shuvon B, NP      . haloperidol (HALDOL) tablet 1 mg  1 mg Oral Q6H PRN Rankin, Shuvon B, NP       Or  . haloperidol lactate (HALDOL) injection 1 mg  1 mg Intramuscular Q6H PRN Rankin, Shuvon B, NP      . hydrOXYzine (ATARAX/VISTARIL) tablet 25 mg  25 mg Oral TID PRN Rankin, Shuvon B, NP   25 mg at 10/19/17 2113  . LORazepam (ATIVAN) tablet 1 mg  1 mg Oral Q6H PRN Rankin, Shuvon B, NP       Or  . LORazepam (ATIVAN) injection 1 mg  1 mg Intramuscular Q6H PRN Rankin, Shuvon B, NP      . [START ON 10/23/2017] paliperidone (INVEGA SUSTENNA) injection 156 mg  156 mg Intramuscular Q28 days Maris Berger T, MD      . paliperidone (INVEGA) 24 hr tablet 3 mg  3 mg Oral Once Rankin, Shuvon B, NP      . [START ON 10/21/2017] paliperidone (INVEGA) 24 hr tablet 6  mg  6 mg Oral Daily Brynn Reznik T, MD      . traZODone (DESYREL) tablet 50 mg  50 mg Oral QHS,MR X 1 Derrill [place], NP        Lab Results:  Results for orders placed or performed during the hospital encounter of 10/17/17 (from the past 48 hour(s))  Lipid panel     Status: None   Collection Time: 10/19/17  6:33 AM  Result Value Ref Range  Cholesterol 139 0 - 200 mg/dL   Triglycerides 52 <150 mg/dL   HDL 43 >40 mg/dL   Total CHOL/HDL Ratio 3.2 RATIO   VLDL 10 0 - 40 mg/dL   LDL Cholesterol 86 0 - 99 mg/dL    Comment:        Total Cholesterol/HDL:CHD Risk Coronary Heart Disease Risk Table                     Men   Women  1/2 Average Risk   3.4   3.3  Average Risk       5.0   4.4  2 X Average Risk   9.6   7.1  3 X Average Risk  23.4   11.0        Use the calculated Patient Ratio above and the CHD Risk Table to determine the patient's CHD Risk.        ATP III CLASSIFICATION (LDL):  <100     mg/dL   Optimal  100-129  mg/dL   Near or Above                    Optimal  130-159  mg/dL   Borderline  160-189  mg/dL   High  >190     mg/dL   Very High Performed at [facility] [address], [place], [place] [address]   Hemoglobin A1c     Status: None   Collection Time: 10/19/17  6:33 AM  Result Value Ref Range   Hgb A1c MFr Bld 5.1 4.8 - 5.6 %    Comment: (NOTE) Pre diabetes:          5.7%-6.4% Diabetes:              >6.4% Glycemic control for   <7.0% adults with diabetes    Mean Plasma Glucose 99.67 mg/dL    Comment: Performed at [facility] [address], [place], [place] [address]  Ethanol     Status: None   Collection Time: 10/19/17  6:33 AM  Result Value Ref Range   Alcohol, Ethyl (B) <10 <10 mg/dL    Comment: (NOTE) Lowest detectable limit for serum alcohol is 10 mg/dL. For medical purposes only. Performed at [facility] [place] [address] [place], [place] [address]   Prolactin     Status: Abnormal    Collection Time: 10/19/17  6:33 AM  Result Value Ref Range   Prolactin 73.5 (H) 4.8 - 23.3 ng/mL    Comment: (NOTE) Performed At: [facility] [place], [place] [id number] Rush Farmer MD HY:[id number] Performed at [facility], [place] [address] [place], [place] [address]   TSH     Status: None   Collection Time: 10/19/17  6:33 AM  Result Value Ref Range   TSH 2.821 0.350 - 4.500 uIU/mL    Comment: Performed by a 3rd Generation assay with a functional sensitivity of <=0.01 uIU/mL. Performed at [facility], [place] [address] [place], [place] [address]   Comprehensive metabolic panel     Status: Abnormal   Collection Time: 10/19/17  6:33 AM  Result Value Ref Range   Sodium 143 135 - 145 mmol/L   Potassium 3.5 3.5 - 5.1 mmol/L   Chloride 107 101 - 111 mmol/L   CO2 25 22 - 32 mmol/L   Glucose, Bld 86 65 - 99 mg/dL   BUN 14 6 - 20 mg/dL  Creatinine, Ser 0.82 0.44 - 1.00 mg/dL   Calcium 9.0 8.9 - 10.3 mg/dL   Total Protein 7.1 6.5 - 8.1 g/dL   Albumin 4.2 3.5 - 5.0 g/dL   AST 12 (L) 15 - 41 U/L   ALT 11 (L) 14 - 54 U/L   Alkaline Phosphatase 81 38 - 126 U/L   Total Bilirubin 0.3 0.3 - 1.2 mg/dL   GFR calc non Af Amer >60 >60 mL/min   GFR calc Af Amer >60 >60 mL/min    Comment: (NOTE) The eGFR has been calculated using the CKD EPI equation. This calculation has not been validated in all clinical situations. eGFR's persistently <60 mL/min signify possible Chronic Kidney Disease.    Anion gap 11 5 - 15    Comment: Performed at [facility], [place] [address] [place], [place] [address]  CBC     Status: None   Collection Time: 10/19/17  6:33 AM  Result Value Ref Range   WBC 7.9 4.0 - 10.5 K/uL   RBC 4.29 3.87 - 5.11 MIL/uL   Hemoglobin 13.5 12.0 - 15.0 g/dL   HCT 39.9 36.0 - 46.0 %   MCV 93.0 78.0 - 100.0 fL   MCH 31.5 26.0 - 34.0 pg   MCHC 33.8 30.0 - 36.0 g/dL   RDW 12.7 11.5 - 15.5 %    Platelets 337 150 - 400 K/uL    Comment: Performed at [facility], [place] [address] [place], [place] [address]    Blood Alcohol level:  Lab Results  Component Value Date   ETH <10 10/19/2017   ETH <10 28/76/8115    Metabolic Disorder Labs: Lab Results  Component Value Date   HGBA1C 5.1 10/19/2017   MPG 99.67 10/19/2017   MPG 102.54 09/25/2017   Lab Results  Component Value Date   PROLACTIN 73.5 (H) 10/19/2017   PROLACTIN 4.0 10/14/2015   Lab Results  Component Value Date   CHOL 139 10/19/2017   TRIG 52 10/19/2017   HDL 43 10/19/2017   CHOLHDL 3.2 10/19/2017   VLDL 10 10/19/2017   LDLCALC 86 10/19/2017   LDLCALC 52 09/25/2017    Physical Findings: AIMS: Facial and Oral Movements Muscles of Facial Expression: None, normal Lips and Perioral Area: None, normal Jaw: None, normal Tongue: None, normal,Extremity Movements Upper (arms, wrists, hands, fingers): None, normal Lower (legs, knees, ankles, toes): None, normal, Trunk Movements Neck, shoulders, hips: None, normal, Overall Severity Severity of abnormal movements (highest score from questions above): None, normal Incapacitation due to abnormal movements: None, normal Patient's awareness of abnormal movements (rate only patient's report): No Awareness, Dental Status Current problems with teeth and/or dentures?: No Does patient usually wear dentures?: No  CIWA:    COWS:     Musculoskeletal: Strength & Muscle Tone: within normal limits Gait & Station: normal Patient leans: N/A  Psychiatric Specialty Exam: Physical Exam  Nursing note and vitals reviewed.   Review of Systems  Constitutional: Negative for chills and fever.  Respiratory: Negative for cough and shortness of breath.   Cardiovascular: Negative for chest pain.  Gastrointestinal: Negative for abdominal pain, heartburn, nausea and vomiting.  Psychiatric/Behavioral: Negative for depression, hallucinations and suicidal ideas. The  patient is not nervous/anxious and does not have insomnia.     Blood pressure 105/69, pulse 73, temperature 98.1 F (36.7 C), temperature source Oral, resp. rate 16, height '5\' 4"'  (1.626 m), weight 81.6 kg (180 lb), last menstrual period 09/20/2017, SpO2 99 %.Body mass index  is 30.9 kg/m.  General Appearance: Casual and Fairly Groomed  Eye Contact:  Good  Speech:  Clear and Coherent and Normal Rate  Volume:  Normal  Mood:  Irritable  Affect:  Blunt and Congruent  Thought Process:  Coherent and Goal Directed  Orientation:  Full (Time, Place, and Person)  Thought Content:  Delusions and Paranoid Ideation  Suicidal Thoughts:  No  Homicidal Thoughts:  No  Memory:  Immediate;   Fair Recent;   Fair Remote;   Fair  Judgement:  Fair  Insight:  Lacking  Psychomotor Activity:  Normal  Concentration:  Concentration: Fair  Recall:  [employer] of Knowledge:  Fair  Language:  Fair  Akathisia:  No  Handed:    AIMS (if indicated):     Assets:  Resilience Social Support  ADL's:  Intact  Cognition:  WNL  Sleep:  Number of Hours: 6.75   Treatment Plan Summary: Daily contact with patient to assess and evaluate symptoms and progress in treatment and Medication management   -Continue inpatient hospitalization  -Schizoaffective disorder, bipolar type   -Change Invega 19m po qDay to [employer]631mpo qDay   -Start InLorayne Benderustenna 23428mM once followed by InvKirt Boys95m67m q28 days starting on 10/23/17   -Insomnia:             -Continue with Trazodone 50 mg   -Agitation protocol:             -Continue Haldol 5mg 48m IM q6h prn agitation   -Continue  Ativan 1 mg PO/IM q6h prn agitation  -Anxiety   -Continue atarax 25mg 69m8h prn anxiety  -disposition planning will be ongoing  - Encourage patient to participate in therapeutic milieu and groups  ChristPennelope Bracken/27/2019, 2:54 PM

## 2017-10-20 NOTE — Plan of Care (Signed)
  Problem: Education: Goal: Emotional status will improve Outcome: Progressing   Problem: Coping: Goal: Ability to identify and develop effective coping behavior will improve Outcome: Not Progressing   Problem: Activity: Goal: Interest or engagement in leisure activities will improve Outcome: Not Progressing  DAR NOTE: Patient presents with anxious affect and mood.  Denies suicidal thoughts, pain, auditory and visual hallucinations.  Rates depression at 0, hopelessness at 0, and anxiety at 0.  Maintained on routine safety checks.  Medications given as prescribed.  Support and encouragement offered as needed.  States goal for today is "discharge."  Patient visible in milieu with minimal interaction with staff and peers.  Offered no complaint.

## 2017-10-20 NOTE — Plan of Care (Signed)
D: Pt stayed in her room majority of the evening A: Pt was offered support and encouragement. Pt was encourage to attend groups. Q 15 minute checks were done for safety.  R: safety maintained on unit.   Problem: Coping: Goal: Ability to identify and develop effective coping behavior will improve Outcome: Progressing   Problem: Activity: Goal: Interest or engagement in leisure activities will improve Outcome: Not Progressing

## 2017-10-21 MED ORDER — MAGNESIUM HYDROXIDE 400 MG/5ML PO SUSP
30.0000 mL | Freq: Every day | ORAL | Status: DC | PRN
  Administered 2017-10-21: 30 mL via ORAL
  Filled 2017-10-21: qty 30

## 2017-10-21 MED ORDER — TRAZODONE HCL 50 MG PO TABS
50.0000 mg | ORAL_TABLET | Freq: Every evening | ORAL | Status: DC | PRN
  Administered 2017-10-21: 50 mg via ORAL

## 2017-10-21 MED ORDER — HYDROXYZINE HCL 50 MG PO TABS
50.0000 mg | ORAL_TABLET | Freq: Four times a day (QID) | ORAL | Status: DC | PRN
  Administered 2017-10-21: 50 mg via ORAL
  Filled 2017-10-21: qty 1
  Filled 2017-10-21: qty 10
  Filled 2017-10-21: qty 1

## 2017-10-21 MED ORDER — MAGNESIUM HYDROXIDE 400 MG/5ML PO SUSP: 5.0000 mL | Freq: Every day | ORAL | Status: DC | PRN

## 2017-10-21 NOTE — Plan of Care (Signed)
  Problem: Education: Goal: Verbalization of understanding the information provided will improve Outcome: Not Progressing   Problem: Activity: Goal: Interest or engagement in leisure activities will improve Outcome: Not Progressing   Problem: Self-Concept: Goal: Will verbalize positive feelings about self Outcome: Not Progressing  DAR NOTE: Patient presents with anxious affect and  mood.  Denies suicidal thoughts, pain, auditory and visual hallucinations.  Rates depression at 0, hopelessness at 0, and anxiety at 0.  Maintained on routine safety checks.  Refused morning medication after several encouragements.  Patient states she already took the [place] shot.  Patient educated on the mechanism of action but still refused.  Support and encouragement offered as needed.  Attended group and participated.  States goal for today is "discharge."  Patient remained in her room for most of this shift.  Offered no complaint.

## 2017-10-21 NOTE — Plan of Care (Signed)
D: Pt denies SI/HI/AVH. Pt is pleasant and cooperative. Pt stated "all I want to do is sleep". Pt forwards little information , pt visible on the unit. Pt appears to minimize her being here. Pt stated the only thing wrong with her was she needed to sleep.   A: Pt was offered support and encouragement. Pt was given scheduled medications. Pt was encourage to attend groups. Q 15 minute checks were done for safety.   R:Pt attends groups and interacts well with peers and staff. Pt is taking medication. Pt has no complaints.Pt receptive to treatment and safety maintained on unit.   Problem: Education: Goal: Emotional status will improve Outcome: Progressing   Problem: Education: Goal: Mental status will improve Outcome: Progressing   Problem: Coping: Goal: Ability to identify and develop effective coping behavior will improve Outcome: Progressing

## 2017-10-21 NOTE — [facility] Suicide Risk Assessment (Signed)
[facility] INPATIENT:  Family/Significant Other Suicide Prevention Education  Suicide Prevention Education:  Education Completed; No one  has been identified by the patient as the family member/significant other with whom the patient will be residing, and identified as the person(s) who will aid the patient in the event of a mental health crisis (suicidal ideations/suicide attempt).  With written consent from the patient, the family member/significant other has been provided the following suicide prevention education, prior to the and/or following the discharge of the patient.  The suicide prevention education provided includes the following:  Suicide risk factors  Suicide prevention and interventions  National Suicide Hotline telephone number  [facility] assessment telephone number  [facility] Emergency Assistance 911  [facility] and/or Residential Mobile Crisis Unit telephone number  Request made of family/significant other to:  Remove weapons (e.g., guns, rifles, knives), all items previously/currently identified as safety concern.    Remove drugs/medications (over-the-counter, prescriptions, illicit drugs), all items previously/currently identified as a safety concern.  The family member/significant other verbalizes understanding of the suicide prevention education information provided.  The family member/significant other agrees to remove the items of safety concern listed above. The patient did not endorse SI at the time of admission, nor did the patient c/o SI during the stay here.  SPE not required. However, I did talk to  mother,  [name], [phone] and went over treatment team recommendations and crises plan.  Mother talked about patient s's inability to hold a job due to inability to tolerate stress, and how they are getting an attorney to apply for disability.  Also expressed concern that daughter will not go back to Dr Geanie Cooley as she does not like him, and  is also worried that they will not be able to afford medication since they do not have insurance.  Ida Rogue 10/21/2017, 3:27 PM

## 2017-10-21 NOTE — Progress Notes (Signed)
Recreation Therapy Notes  Date: 5.28.19 Time: 1000 Location: 500 Hall Dayroom  Group Topic: Coping Skills  Goal Area(s) Addresses:  Patients will be able to identify positive coping skills. Patients will be able to identify benefits of coping skills. Patients will be able to identify benefits of using coping skills post d/c.  Intervention: Worksheet   Activity: Orthoptist.  Patients were to identify the things they feel have them stuck.  Patients were to write those things inside of the spider webb.  Patients would then identify at least 3 coping skills for each situation identified and write it on the outside of the webb.  Education: Pharmacologist, Building control surveyor.   Education Outcome: Acknowledges understanding/In group clarification offered/Needs additional education.   Clinical Observations/Feedback: Pt did not attend group.    Caroll Rancher, LRT/CTRS         Caroll Rancher A 10/21/2017 12:05 PM

## 2017-10-21 NOTE — Progress Notes (Signed)
Did not attend group 

## 2017-10-21 NOTE — Progress Notes (Signed)
[facility] MD Progress Note  10/21/2017 4:46 PM Theresa [name]  MRN:  [id number] Subjective:    Theresa [name] is a 22 y/o F with history of schizoaffective disorder bipolar type who was admitted as a walk in with worsening symptoms of AH, delusions, paranoia, mood lability, no sleep for 4 days prior to admission, and episodes resembling catatonia. Pt has recent history of discharge from inpatient psychiatry unit at [facility] on 10/02/17. Pt had poor compliance with her discharge medications, and she was started on trial of Invega during this admission, and she was transitioned to long-acting injectable form of [place] with first dose given on 5/27. Pt has demonstrated incremental improvement of her presenting symptoms during her admission.  Today upon evaluation, pt shares, "I'm good." She is generally cooperative with interview, but she is irritable and demands for discharge multiple times during interview. She denies physical complaints. She is sleeping well. Her appetite is good. She denies SI/HI/AH/VH. She reports she is tolerating her medication without difficulty or side effects, but she has been refusing oral form of Invega. She has poor insight and reports she does not have mental illness and does not feel that she needs any medication. Reviewed with patient that we had discussed yesterday about her history of poor adherence, and pt was in agreement with plan to receive Gean Birchwood. Discussed with patient that she could be discharged after booster injection of [place] if she had ongoing stability of her presenting symptoms, and pt verbalized good understanding. She had no further questions, comments, or concerns.  Principal Problem: Schizoaffective disorder, bipolar type (HCC) Diagnosis:   Patient Active Problem List   Diagnosis Date Noted  . Schizoaffective disorder, bipolar type (HCC) [F25.0] 10/17/2017  . UTI (urinary tract infection)  [N39.0] 09/28/2017  . Undifferentiated schizophrenia (HCC) [F20.3] 09/23/2017  . Bipolar affective disorder (HCC) [F31.9] 09/20/2017  . Bipolar affective disorder, mixed, severe, with psychotic behavior (HCC) [F31.64] 10/04/2016  . PCOS (polycystic ovarian syndrome) [E28.2] 01/18/2016  . Cannabis use disorder, moderate, dependence (HCC) [F12.20] 12/06/2015  . Tobacco use disorder [F17.200] 12/05/2015   Total Time spent with patient: 30 minutes  Past Psychiatric History: see H&P  Past Medical History:  Past Medical History:  Diagnosis Date  . Abnormal vaginal Pap smear   . Anxiety   . Bipolar disorder (HCC)   . Depression   . Migraine without aura   . Polycystic ovary syndrome   . Psychosis (HCC)   . Schizophrenia (HCC)   . STD (sexually transmitted disease) 10-14-15   chlamydia    History reviewed. No pertinent surgical history. Family History:  Family History  Problem Relation Age of Onset  . Hypertension Mother   . Heart disease Mother   . Cancer Mother   . Hypertension Other   . Depression Father   . Drug abuse Father   . Bipolar disorder Father   . Heart disease Maternal Grandmother   . Pancreatitis Maternal Grandfather   . Cancer Paternal Grandmother   . Cancer Paternal Grandfather    Family Psychiatric  History: see H&P Social History:  Social History   Substance and Sexual Activity  Alcohol Use Yes  . Alcohol/week: 0.0 oz     Social History   Substance and Sexual Activity  Drug Use Yes  . Types: Marijuana   Comment: pt denies    Social History   Socioeconomic History  . Marital status: Married    Spouse name: Not on file  .  Number of children: Not on file  . Years of education: Not on file  . Highest education level: Not on file  Occupational History  . Not on file  Social Needs  . Financial resource strain: Not on file  . Food insecurity:    Worry: Not on file    Inability: Not on file  . Transportation needs:    Medical: Not on file     Non-medical: Not on file  Tobacco Use  . Smoking status: Current Every Day Smoker    Packs/day: 0.50    Types: Cigarettes  . Smokeless tobacco: Never Used  Substance and Sexual Activity  . Alcohol use: Yes    Alcohol/week: 0.0 oz  . Drug use: Yes    Types: Marijuana    Comment: pt denies  . Sexual activity: Not Currently    Partners: Male    Birth control/protection: None  Lifestyle  . Physical activity:    Days per week: Not on file    Minutes per session: Not on file  . Stress: Not on file  Relationships  . Social connections:    Talks on phone: Not on file    Gets together: Not on file    Attends religious service: Not on file    Active member of club or organization: Not on file    Attends meetings of clubs or organizations: Not on file    Relationship status: Not on file  Other Topics Concern  . Not on file  Social History Narrative  . Not on file   Additional Social History:    Pain Medications: see MAR Prescriptions: see MAR Over the Counter: see MAR History of alcohol / drug use?: Yes Longest period of sobriety (when/how long): 1 - month, patient denies substance use   Name of Substance 2: cigarettes 2 - Age of First Use: TEEN 2 - Amount (size/oz): 1/2 PACK  2 - Frequency: DAILY 2 - Duration: ONGOING 2 - Last Use / Amount: unknown                Sleep: Good  Appetite:  Good  Current Medications: Current Facility-Administered Medications  Medication Dose Route Frequency Provider Last Rate Last Dose  . albuterol (PROVENTIL HFA;VENTOLIN HFA) 108 (90 Base) MCG/ACT inhaler 1-2 puff  1-2 puff Inhalation Q6H PRN Rankin, Shuvon B, NP      . haloperidol (HALDOL) tablet 5 mg  5 mg Oral Q6H PRN Micheal Likens, MD       Or  . haloperidol lactate (HALDOL) injection 5 mg  5 mg Intramuscular Q6H PRN Micheal Likens, MD      . hydrOXYzine (ATARAX/VISTARIL) tablet 50 mg  50 mg Oral Q6H PRN Micheal Likens, MD      . magnesium  hydroxide (MILK OF MAGNESIA) suspension 30 mL  30 mL Oral Daily PRN Micheal Likens, MD   30 mL at 10/21/17 1541  . [START ON 10/23/2017] paliperidone (INVEGA SUSTENNA) injection 156 mg  156 mg Intramuscular Q28 days Micheal Likens, MD      . traZODone (DESYREL) tablet 50 mg  50 mg Oral QHS PRN,MR X 1 Imaya Duffy T, MD        Lab Results: No results found for this or any previous visit (from the past 48 hour(s)).  Blood Alcohol level:  Lab Results  Component Value Date   [facility] <10 10/19/2017   ETH <10 09/21/2017    Metabolic Disorder Labs: Lab Results  Component Value  Date   HGBA1C 5.1 10/19/2017   MPG 99.67 10/19/2017   MPG 102.54 09/25/2017   Lab Results  Component Value Date   PROLACTIN 73.5 (H) 10/19/2017   PROLACTIN 4.0 10/14/2015   Lab Results  Component Value Date   CHOL 139 10/19/2017   TRIG 52 10/19/2017   HDL 43 10/19/2017   CHOLHDL 3.2 10/19/2017   VLDL 10 10/19/2017   LDLCALC 86 10/19/2017   LDLCALC 52 09/25/2017    Physical Findings: AIMS: Facial and Oral Movements Muscles of Facial Expression: None, normal Lips and Perioral Area: None, normal Jaw: None, normal Tongue: None, normal,Extremity Movements Upper (arms, wrists, hands, fingers): None, normal Lower (legs, knees, ankles, toes): None, normal, Trunk Movements Neck, shoulders, hips: None, normal, Overall Severity Severity of abnormal movements (highest score from questions above): None, normal Incapacitation due to abnormal movements: None, normal Patient's awareness of abnormal movements (rate only patient's report): No Awareness, Dental Status Current problems with teeth and/or dentures?: No Does patient usually wear dentures?: No  CIWA:    COWS:     Musculoskeletal: Strength & Muscle Tone: within normal limits Gait & Station: normal Patient leans: N/A  Psychiatric Specialty Exam: Physical Exam  Nursing note and vitals reviewed.   Review of Systems   Constitutional: Negative for chills and fever.  Respiratory: Negative for cough and shortness of breath.   Cardiovascular: Negative for chest pain.  Gastrointestinal: Negative for abdominal pain, heartburn, nausea and vomiting.  Psychiatric/Behavioral: Negative for depression, hallucinations and suicidal ideas. The patient is not nervous/anxious and does not have insomnia.     Blood pressure 110/64, pulse 86, temperature 98 F (36.7 C), temperature source Oral, resp. rate 18, height  (1.626 m), weight 81.6 kg (180 lb), SpO2 99 %.Body mass index is 30.9 kg/m.  General Appearance: Casual and Fairly Groomed  Eye Contact:  Good  Speech:  Clear and Coherent and Normal Rate  Volume:  Normal  Mood:  Anxious and Irritable  Affect:  Blunt and Labile  Thought Process:  Coherent and Goal Directed  Orientation:  Full (Time, Place, and Person)  Thought Content:  Logical  Suicidal Thoughts:  No  Homicidal Thoughts:  No  Memory:  Immediate;   Fair Recent;   Fair Remote;   Fair  Judgement:  Poor  Insight:  Lacking  Psychomotor Activity:  Normal  Concentration:  Concentration: Fair  Recall:  [employer] of Knowledge:  Fair  Language:  Fair  Akathisia:  No  Handed:    AIMS (if indicated):     Assets:  Resilience Social Support  ADL's:  Intact  Cognition:  WNL  Sleep:  Number of Hours: 6.75   Treatment Plan Summary: Daily contact with patient to assess and evaluate symptoms and progress in treatment and Medication management    -Continue inpatient hospitalization  -Schizoaffective disorder, bipolar type             -Discontinue Invega  po qDay (pt refusing oral medication)             -Continue Invega Sustenna. Hinda Glatter Sustenna  IM once given on 10/20/17. Plan for Gean Birchwood  IM q28 days starting on 10/23/17   -Insomnia: -Continue with Trazodone    -Agitation protocol: -Continue Haldol  PO/ IM q6h prn agitation               -Anxiety             -Continue atarax  po q8h prn anxiety  -disposition  planning will be ongoing  - Encourage patient to participate in therapeutic milieu and groups   Micheal Likens, MD 10/21/2017, 4:46 PM

## 2017-10-21 NOTE — [facility] Group Notes (Signed)
LCSW Group Therapy Note   10/21/2017 1:15pm   Type of Therapy and Topic:  Group Therapy:  Overcoming Obstacles   Participation Level:  Did Not Attend   Description of Group:    In this group patients will be encouraged to explore what they see as obstacles to their own wellness and recovery. They will be guided to discuss their thoughts, feelings, and behaviors related to these obstacles. The group will process together ways to cope with barriers, with attention given to specific choices patients can make. Each patient will be challenged to identify changes they are motivated to make in order to overcome their obstacles. This group will be process-oriented, with patients participating in exploration of their own experiences as well as giving and receiving support and challenge from other group members.   Therapeutic Goals: 1. Patient will identify personal and current obstacles as they relate to admission. 2. Patient will identify barriers that currently interfere with their wellness or overcoming obstacles.  3. Patient will identify feelings, thought process and behaviors related to these barriers. 4. Patient will identify two changes they are willing to make to overcome these obstacles:      Summary of Patient Progress  Initially came, but left after 5 minutes and did not return.    Therapeutic Modalities:   Cognitive Behavioral Therapy Solution Focused Therapy Motivational Interviewing Relapse Prevention Therapy  Ida Rogue, LCSW 10/21/2017 3:18 PM

## 2017-10-21 NOTE — Tx Team (Signed)
Interdisciplinary Treatment and Diagnostic Plan Update  10/21/2017 Time of Session: 8:39 AM  Nasira Janusz MRN: [id number]  Principal Diagnosis: Schizoaffective disorder, bipolar type [facility])  Secondary Diagnoses: Principal Problem:   Schizoaffective disorder, bipolar type ([place])   Current Medications:  Current Facility-Administered Medications  Medication Dose Route Frequency Provider Last Rate Last Dose  . albuterol (PROVENTIL HFA;VENTOLIN HFA) 108 (90 Base) MCG/ACT inhaler 1-2 puff  1-2 puff Inhalation Q6H PRN Rankin, Shuvon B, NP      . haloperidol (HALDOL) tablet 5 mg  5 mg Oral Q6H PRN Pennelope Bracken, MD       Or  . haloperidol lactate (HALDOL) injection 5 mg  5 mg Intramuscular Q6H PRN Pennelope Bracken, MD      . hydrOXYzine (ATARAX/VISTARIL) tablet 25 mg  25 mg Oral TID PRN Rankin, Shuvon B, NP   25 mg at 10/19/17 2113  . LORazepam (ATIVAN) tablet 1 mg  1 mg Oral Q6H PRN Rankin, Shuvon B, NP       Or  . LORazepam (ATIVAN) injection 1 mg  1 mg Intramuscular Q6H PRN Rankin, Shuvon B, NP      . [START ON 10/23/2017] paliperidone (INVEGA SUSTENNA) injection 156 mg  156 mg Intramuscular Q28 days Maris Berger T, MD      . paliperidone (INVEGA) 24 hr tablet 3 mg  3 mg Oral Once Rankin, Shuvon B, NP      . paliperidone (INVEGA) 24 hr tablet 6 mg  6 mg Oral Daily [place], Christopher T, MD      . traZODone (DESYREL) tablet 50 mg  50 mg Oral QHS,MR X 1 Derrill Center, NP        PTA Medications: Medications Prior to Admission  Medication Sig Dispense Refill Last Dose  . albuterol (PROVENTIL HFA;VENTOLIN HFA) 108 (90 Base) MCG/ACT inhaler Inhale 1-2 puffs into the lungs every 6 (six) hours as needed for wheezing or shortness of breath. 1 Inhaler 0   . lamoTRIgine (LAMICTAL) 25 MG tablet Take 50 mg by mouth daily.   Not Taking at Unknown time  . OLANZapine (ZYPREXA) 15 MG tablet Take 30 mg by mouth at bedtime.   Not Taking at Unknown time  .  traZODone (DESYREL) 100 MG tablet Take 100 mg by mouth at bedtime.   Not Taking at Unknown time    Patient Stressors: Health problems Medication change or noncompliance  Patient Strengths: Ability for insight Average or above average intelligence Communication skills Supportive family/friends  Treatment Modalities: Medication Management, Group therapy, Case management,  1 to 1 session with clinician, Psychoeducation, Recreational therapy.   Physician Treatment Plan for Primary Diagnosis: Schizoaffective disorder, bipolar type ([place]) Long Term Goal(s): Improvement in symptoms so as ready for discharge  Short Term Goals: Ability to verbalize feelings will improve Compliance with prescribed medications will improve Ability to identify changes in lifestyle to reduce recurrence of condition will improve Ability to identify and develop effective coping behaviors will improve Compliance with prescribed medications will improve  Medication Management: Evaluate patient's response, side effects, and tolerance of medication regimen.  Therapeutic Interventions: 1 to 1 sessions, Unit Group sessions and Medication administration.  Evaluation of Outcomes: Progressing  Physician Treatment Plan for Secondary Diagnosis: Principal Problem:   Schizoaffective disorder, bipolar type ([place])   Long Term Goal(s): Improvement in symptoms so as ready for discharge  Short Term Goals: Ability to verbalize feelings will improve Compliance with prescribed medications will improve Ability to identify changes in lifestyle to reduce  recurrence of condition will improve Ability to identify and develop effective coping behaviors will improve Compliance with prescribed medications will improve  Medication Management: Evaluate patient's response, side effects, and tolerance of medication regimen.  Therapeutic Interventions: 1 to 1 sessions, Unit Group sessions and Medication administration.  Evaluation of  Outcomes: Progressing   RN Treatment Plan for Primary Diagnosis: Schizoaffective disorder, bipolar type ([place]) Long Term Goal(s): Knowledge of disease and therapeutic regimen to maintain health will improve  Short Term Goals: Ability to identify and develop effective coping behaviors will improve and Compliance with prescribed medications will improve  Medication Management: RN will administer medications as ordered by provider, will assess and evaluate patient's response and provide education to patient for prescribed medication. RN will report any adverse and/or side effects to prescribing provider.  Therapeutic Interventions: 1 on 1 counseling sessions, Psychoeducation, Medication administration, Evaluate responses to treatment, Monitor vital signs and CBGs as ordered, Perform/monitor CIWA, COWS, AIMS and Fall Risk screenings as ordered, Perform wound care treatments as ordered.  Evaluation of Outcomes: Progressing   LCSW Treatment Plan for Primary Diagnosis: Schizoaffective disorder, bipolar type ([place]) Long Term Goal(s): Safe transition to appropriate next level of care at discharge, Engage patient in therapeutic group addressing interpersonal concerns.  Short Term Goals: Engage patient in aftercare planning with referrals and resources  Therapeutic Interventions: Assess for all discharge needs, 1 to 1 time with Social worker, Explore available resources and support systems, Assess for adequacy in community support network, Educate family and significant other(s) on suicide prevention, Complete Psychosocial Assessment, Interpersonal group therapy.  Evaluation of Outcomes: Met Return home, follow up outpt   Progress in Treatment: Attending groups: No Participating in groups: No Taking medication as prescribed: Yes Toleration medication: Yes, no side effects reported at this time Family/Significant other contact made: Left message for mother Patient understands diagnosis: No Limited  insight Discussing patient identified problems/goals with staff: Yes Medical problems stabilized or resolved: Yes Denies suicidal/homicidal ideation: Yes Issues/concerns per patient self-inventory: None Other: N/A  New problem(s) identified: None identified at this time.   New Short Term/Long Term Goal(s): "I already talked to a Education officer, museum this weekend. I've already met my goals-I've been attending groups."   Discharge Plan or Barriers:   Reason for Continuation of Hospitalization: AH, delusions, paranoia, mood lability, no sleep for 4 days  Medication stabilization   Estimated Length of Stay: 5/31  Attendees: Patient: Caroljean Monsivais 10/21/2017  8:39 AM  Physician: Maris Berger, MD 10/21/2017  8:39 AM  Nursing: Sena Hitch, RN 10/21/2017  8:39 AM  RN Care Manager: Lars Pinks, RN 10/21/2017  8:39 AM  Social Worker: Ripley Fraise 10/21/2017  8:39 AM  Recreational Therapist: Winfield Cunas 10/21/2017  8:39 AM  Other: Norberto Sorenson 10/21/2017  8:39 AM  Other:  10/21/2017  8:39 AM    Scribe for Treatment Team:  Roque Lias LCSW 10/21/2017 8:39 AM

## 2017-10-22 NOTE — Plan of Care (Signed)
D: Pt denies SI/HI/AVH. Pt is pleasant and cooperative. Pt presents very paranoid about taking medications. Pt stated she was concerned about sleeping but refused medications to help her sleep. Pt appeared to listen to writer when talking about illegal drugs and possible side effects .   A: Pt was offered support and encouragement. Pt was encourage to attend groups. Q 15 minute checks were done for safety.   R:Pt attends groups and interacts well with peers and staff. Pt is taking medication. Pt receptive to treatment and safety maintained on unit.   Problem: Education: Goal: Emotional status will improve Outcome: Progressing   Problem: Education: Goal: Mental status will improve Outcome: Progressing   Problem: Coping: Goal: Ability to identify and develop effective coping behavior will improve Outcome: Progressing   Problem: Self-Concept: Goal: Will verbalize positive feelings about self Outcome: Progressing

## 2017-10-22 NOTE — Progress Notes (Signed)
Recreation Therapy Notes  Date: 5.29.19 Time: 1000 Location: 500 Hall Dayroom   Group Topic: Communication, Team Building, Problem Solving  Goal Area(s) Addresses:  Patient will effectively work with peer towards shared goal.  Patient will identify skills used to make activity successful.  Patient will identify how skills used during activity can be used to reach post d/c goals.   Intervention: STEM Activity  Activity: Stage manager. In teams patients were given 12 plastic drinking straws and a length of masking tape. Using the materials provided patients were asked to build a landing pad to catch a golf ball dropped from approximately 6 feet in the air.   Education:Social Skills, Discharge Planning   Education Outcome: Acknowledges education/In group clarification offered/Needs additional education.   Clinical Observations/Feedback: Pt did not attend group.   Caroll Rancher, LRT/CTRS         Caroll Rancher A 10/22/2017 12:42 PM

## 2017-10-22 NOTE — Plan of Care (Signed)
  Problem: Education: Goal: Knowledge of [place] [facility] Education information/materials will improve Outcome: Progressing   Problem: Coping: Goal: Ability to identify and develop effective coping behavior will improve Outcome: Progressing   Problem: Activity: Goal: Interest or engagement in leisure activities will improve Outcome: Progressing D: Pt visible in hallway on initial contact. Denies SI, HI, AVH and pain. Per pt "I just want to sleep and I can sleep, every time I try to sleep some noise wake me up" Reports poor sleep last night. Pt presents guarded with flat affect but brightens on interactions. Isolative to room for long period this shift. Reports fair appetite. Rates her depression, anxiety and hopelessness all 0/10. Pt attended noon group minimal . Continues to respond to internal stimuli; seen talking to self while pacing hall.  A: Introduced self to pt. Emotional support and availability provided to pt. Encouraged pt to voice concerns, attend to ADLs and comply with current treatment regimen. All medications administered with verbal education and effects monitored. Safety checks maintained at Q 15 minutes intervals without outburst or self harm gestures.   R: Pt receptive to care. Compliant with medications when offered. Denies adverse drug reactions when assessed this shift. Tolerates all PO intake well. Remains safe on and off unit.

## 2017-10-22 NOTE — Progress Notes (Signed)
Adult Psychoeducational Group Note  Date:  10/22/2017 Time:  9:35 AM  Group Topic/Focus:  Goals Group:   The focus of this group is to help patients establish daily goals to achieve during treatment and discuss how the patient can incorporate goal setting into their daily lives to aide in recovery.  Participation Level:  Minimal  Participation Quality:  Appropriate  Affect:  Appropriate  Cognitive:  Appropriate  Insight: Appropriate  Engagement in Group:  Engaged  Modes of Intervention:  Orientation  Additional Comments:  Pt says the goal is to work/take an active role in her discharge plan as well as attend groups.   Theresa [name] [name] 10/22/2017, 9:35 AM

## 2017-10-22 NOTE — [facility] Group Notes (Signed)
LCSW Group Therapy Note   10/22/2017 1:15pm   Type of Therapy and Topic:  Group Therapy:  Positive Affirmations   Participation Level:  Active  Description of Group: This group addressed positive affirmation toward self and others. Patients went around the room and identified two positive things about themselves and two positive things about a peer in the room. Patients reflected on how it felt to share something positive with others, to identify positive things about themselves, and to hear positive things from others. Patients were encouraged to have a daily reflection of positive characteristics or circumstances.  Therapeutic Goals 1. Patient will verbalize two of their positive qualities 2. Patient will demonstrate empathy for others by stating two positive qualities about a peer in the group 3. Patient will verbalize their feelings when voicing positive self affirmations and when voicing positive affirmations of others 4. Patients will discuss the potential positive impact on their wellness/recovery of focusing on positive traits of self and others. Summary of Patient Progress:  Initally stated she would not come, but surprisingly showed up.  Active participant.  Talked about overcoming drug use.  "I realized I was doing damage to myself, as well as to important relationships.  I told myself that I could do better than this, and thank God it worked."  Hospital doctor on to talk about her stubborn streak, and how that has both gotten her into trouble as well as severed her well.  Therapeutic Modalities Cognitive Behavioral Therapy Motivational Interviewing  Ida Rogue, [place] 10/22/2017 3:32 PM

## 2017-10-22 NOTE — Progress Notes (Signed)
[facility] MD Progress Note  10/22/2017 1:11 PM Theresa [name]  MRN:  [id number] Subjective:    Theresa [name] is a 22 y/o F with history of schizoaffective disorder bipolar type who was admitted as a walk in with worsening symptoms of AH, delusions, paranoia, mood lability, no sleep for 4 days prior to admission, and episodes resembling catatonia. Pt has recent history of discharge from inpatient psychiatry unit at [facility] on 10/02/17. Pt had poor compliance with her discharge medications, and she was started on trial of Invega during this admission, and she was transitioned to long-acting injectable form of [place] with first dose given on 5/27. Pt has demonstrated incremental improvement of her presenting symptoms during her admission.  Today upon evaluation, pt shares, "I'm pretty good." She denies any specific concerns today. She is sleeping well. Her appetite is good. She reports some soreness at injection site of [place], but she otherwise denies physical complaints. She denies SI/HI/AH/VH. She reports last occurrence of AH was several months ago, despite AH/VH being part of her presenting symptoms. She is focused on discharge and again requests to be discharged today. Discussed with patient that we would plan for potential discharge to outpatient level of care tomorrow after receiving booster of Gean Birchwood, if she had ongoing stability of her current symptoms. Pt verbalized good understanding, and she had no further questions, comments, or concerns.   Principal Problem: Schizoaffective disorder, bipolar type (HCC) Diagnosis:   Patient Active Problem List   Diagnosis Date Noted  . Schizoaffective disorder, bipolar type (HCC) [F25.0] 10/17/2017  . UTI (urinary tract infection) [N39.0] 09/28/2017  . Undifferentiated schizophrenia (HCC) [F20.3] 09/23/2017  . Bipolar affective disorder (HCC) [F31.9] 09/20/2017  . Bipolar affective  disorder, mixed, severe, with psychotic behavior (HCC) [F31.64] 10/04/2016  . PCOS (polycystic ovarian syndrome) [E28.2] 01/18/2016  . Cannabis use disorder, moderate, dependence (HCC) [F12.20] 12/06/2015  . Tobacco use disorder [F17.200] 12/05/2015   Total Time spent with patient: 30 minutes  Past Psychiatric History: see H&P  Past Medical History:  Past Medical History:  Diagnosis Date  . Abnormal vaginal Pap smear   . Anxiety   . Bipolar disorder (HCC)   . Depression   . Migraine without aura   . Polycystic ovary syndrome   . Psychosis (HCC)   . Schizophrenia (HCC)   . STD (sexually transmitted disease) 10-14-15   chlamydia    History reviewed. No pertinent surgical history. Family History:  Family History  Problem Relation Age of Onset  . Hypertension Mother   . Heart disease Mother   . Cancer Mother   . Hypertension Other   . Depression Father   . Drug abuse Father   . Bipolar disorder Father   . Heart disease Maternal Grandmother   . Pancreatitis Maternal Grandfather   . Cancer Paternal Grandmother   . Cancer Paternal Grandfather    Family Psychiatric  History: see H&P Social History:  Social History   Substance and Sexual Activity  Alcohol Use Yes  . Alcohol/week: 0.0 oz     Social History   Substance and Sexual Activity  Drug Use Yes  . Types: Marijuana   Comment: pt denies    Social History   Socioeconomic History  . Marital status: Married    Spouse name: Not on file  . Number of children: Not on file  . Years of education: Not on file  . Highest education level: Not on file  Occupational History  .  Not on file  Social Needs  . Financial resource strain: Not on file  . Food insecurity:    Worry: Not on file    Inability: Not on file  . Transportation needs:    Medical: Not on file    Non-medical: Not on file  Tobacco Use  . Smoking status: Current Every Day Smoker    Packs/day: 0.50    Types: Cigarettes  . Smokeless tobacco: Never  Used  Substance and Sexual Activity  . Alcohol use: Yes    Alcohol/week: 0.0 oz  . Drug use: Yes    Types: Marijuana    Comment: pt denies  . Sexual activity: Not Currently    Partners: Male    Birth control/protection: None  Lifestyle  . Physical activity:    Days per week: Not on file    Minutes per session: Not on file  . Stress: Not on file  Relationships  . Social connections:    Talks on phone: Not on file    Gets together: Not on file    Attends religious service: Not on file    Active member of club or organization: Not on file    Attends meetings of clubs or organizations: Not on file    Relationship status: Not on file  Other Topics Concern  . Not on file  Social History Narrative  . Not on file   Additional Social History:    Pain Medications: see MAR Prescriptions: see MAR Over the Counter: see MAR History of alcohol / drug use?: Yes Longest period of sobriety (when/how long): 1 - month, patient denies substance use   Name of Substance 2: cigarettes 2 - Age of First Use: TEEN 2 - Amount (size/oz): 1/2 PACK  2 - Frequency: DAILY 2 - Duration: ONGOING 2 - Last Use / Amount: unknown                Sleep: Good  Appetite:  Good  Current Medications: Current Facility-Administered Medications  Medication Dose Route Frequency Provider Last Rate Last Dose  . albuterol (PROVENTIL HFA;VENTOLIN HFA) 108 (90 Base) MCG/ACT inhaler 1-2 puff  1-2 puff Inhalation Q6H PRN Rankin, Shuvon B, NP      . haloperidol (HALDOL) tablet 5 mg  5 mg Oral Q6H PRN Micheal Likens, MD       Or  . haloperidol lactate (HALDOL) injection 5 mg  5 mg Intramuscular Q6H PRN Micheal Likens, MD      . hydrOXYzine (ATARAX/VISTARIL) tablet 50 mg  50 mg Oral Q6H PRN Micheal Likens, MD   50 mg at 10/21/17 2127  . magnesium hydroxide (MILK OF MAGNESIA) suspension 30 mL  30 mL Oral Daily PRN Micheal Likens, MD   30 mL at 10/21/17 1541  . [START ON  10/23/2017] paliperidone (INVEGA SUSTENNA) injection 156 mg  156 mg Intramuscular Q28 days Jolyne Loa T, MD      . traZODone (DESYREL) tablet 50 mg  50 mg Oral QHS PRN,MR X 1 Micheal Likens, MD   50 mg at 10/21/17 2127    Lab Results: No results found for this or any previous visit (from the past 48 hour(s)).  Blood Alcohol level:  Lab Results  Component Value Date   ETH <10 10/19/2017   ETH <10 09/21/2017    Metabolic Disorder Labs: Lab Results  Component Value Date   HGBA1C 5.1 10/19/2017   MPG 99.67 10/19/2017   MPG 102.54 09/25/2017   Lab Results  Component Value Date   PROLACTIN 73.5 (H) 10/19/2017   PROLACTIN 4.0 10/14/2015   Lab Results  Component Value Date   CHOL 139 10/19/2017   TRIG 52 10/19/2017   HDL 43 10/19/2017   CHOLHDL 3.2 10/19/2017   VLDL 10 10/19/2017   LDLCALC 86 10/19/2017   LDLCALC 52 09/25/2017    Physical Findings: AIMS: Facial and Oral Movements Muscles of Facial Expression: None, normal Lips and Perioral Area: None, normal Jaw: None, normal Tongue: None, normal,Extremity Movements Upper (arms, wrists, hands, fingers): None, normal Lower (legs, knees, ankles, toes): None, normal, Trunk Movements Neck, shoulders, hips: None, normal, Overall Severity Severity of abnormal movements (highest score from questions above): None, normal Incapacitation due to abnormal movements: None, normal Patient's awareness of abnormal movements (rate only patient's report): No Awareness, Dental Status Current problems with teeth and/or dentures?: No Does patient usually wear dentures?: No  CIWA:    COWS:     Musculoskeletal: Strength & Muscle Tone: within normal limits Gait & Station: normal Patient leans: N/A  Psychiatric Specialty Exam: Physical Exam  Nursing note and vitals reviewed.   Review of Systems  Constitutional: Negative for chills and fever.  Respiratory: Negative for cough and shortness of breath.    Cardiovascular: Negative for chest pain.  Gastrointestinal: Negative for abdominal pain, heartburn, nausea and vomiting.  Psychiatric/Behavioral: Negative for depression, hallucinations and suicidal ideas. The patient is not nervous/anxious and does not have insomnia.     Blood pressure 111/69, pulse (!) 102, temperature 98.2 F (36.8 C), temperature source Oral, resp. rate 16, height  (1.626 m), weight 81.6 kg (180 lb), SpO2 99 %.Body mass index is 30.9 kg/m.  General Appearance: Casual and Fairly Groomed  Eye Contact:  Good  Speech:  Clear and Coherent and Normal Rate  Volume:  Normal  Mood:  Irritable  Affect:  Appropriate and Congruent  Thought Process:  Coherent and Goal Directed  Orientation:  Full (Time, Place, and Person)  Thought Content:  Logical  Suicidal Thoughts:  No  Homicidal Thoughts:  No  Memory:  Immediate;   Fair Recent;   Fair Remote;   Fair  Judgement:  Poor  Insight:  Lacking  Psychomotor Activity:  Normal  Concentration:  Concentration: Fair  Recall:  [employer] of Knowledge:  Fair  Language:  Fair  Akathisia:  No  Handed:    AIMS (if indicated):     Assets:  Manufacturing systems engineer Physical Health Resilience Social Support  ADL's:  Intact  Cognition:  WNL  Sleep:  Number of Hours: 6.75   Treatment Plan Summary: Daily contact with patient to assess and evaluate symptoms and progress in treatment and Medication management   -Continue inpatient hospitalization  -Schizoaffective disorder, bipolar type -Continue [place]. Hinda Glatter Sustenna  IM once given on 10/20/17. Plan for Invega Sustenna  IM q28 days starting on 10/23/17  -Insomnia: -Continue with Trazodone    -Agitation protocol: -Continue Haldol5mg PO/ IM q6h prn agitation  -Anxiety -Continue atarax  po q8h prn anxiety  -dispositionplanning will be ongoing  - Encourage patient to participate  in therapeutic milieuand groups   Micheal Likens, MD 10/22/2017, 1:11 PM

## 2017-10-23 MED ORDER — ALBUTEROL SULFATE HFA 108 (90 BASE) MCG/ACT IN AERS: 1.0000 | INHALATION_SPRAY | Freq: Four times a day (QID) | RESPIRATORY_TRACT | 0 refills | Status: DC | PRN

## 2017-10-23 MED ORDER — HYDROXYZINE HCL 50 MG PO TABS: 50.0000 mg | ORAL_TABLET | Freq: Four times a day (QID) | ORAL | 0 refills | Status: DC | PRN

## 2017-10-23 MED ORDER — TRAZODONE HCL 50 MG PO TABS
50.0000 mg | ORAL_TABLET | Freq: Every evening | ORAL | Status: DC | PRN
  Filled 2017-10-23 (×2): qty 1

## 2017-10-23 MED ORDER — PALIPERIDONE PALMITATE ER 156 MG/ML IM SUSY: 156.0000 mg | PREFILLED_SYRINGE | INTRAMUSCULAR | 0 refills | Status: DC

## 2017-10-23 MED ORDER — TRAZODONE HCL 50 MG PO TABS: 50.0000 mg | ORAL_TABLET | Freq: Every evening | ORAL | 0 refills | Status: DC | PRN

## 2017-10-23 NOTE — Progress Notes (Signed)
D: patient found in bed this morning. Stated she didn't sleep well. Pt stated she is leaving today so it doesn't matter. Pt denies any si/hi/ah/vh and verbally agrees to approach staff if these become apparent. Pt denies any pain. Pt complains of nasal congestion. Pt rates her depression/hopelessness/anxiety all 0/10.  A: pt given support and encouragement. Pt compliant with medication administration per protocol and standing orders. q28m checks implemented and continued.  R: pt safe on the unit. Will continue to monitor.

## 2017-10-23 NOTE — Progress Notes (Signed)
Patient verbalizes readiness for discharge. Follow up plan explained, AVS, Transition record and SRA given. Prescriptions and teaching provided. Belongings returned and signed for. Suicide safety plan completed and signed. Patient verbalizes understanding. Patient denies SI/HI and assures this Clinical research associate he will seek assistance should that change. Patient discharged to lobby where ride was waiting.

## 2017-10-23 NOTE — Discharge Summary (Addendum)
Physician Discharge Summary Note  Patient:  Theresa [name] is an 22 y.o., female MRN:  [id number] DOB:  [birth year] Patient phone:  [phone] (home)  Patient address:   [address] [place] [address],   Total Time spent with patient: Greater than 30 minutes   Date of Admission:  10/17/2017  Date of Discharge: 10/23/2017  Reason for Admission: Worsening symptoms of AH, delusions, paranoia, mood lability, no sleep for 4 days.   Past Psychiatric History: Patient has a history of multiple hospitalizations with a diagnosis of either schizophrenia or schizoaffective disorder.  Has shown improvement in the past with a variety of antipsychotics. Has a long history of noncompliance with medicine because of poor insight.  Family Psychiatric  History: Patient had a grandparent with a psychotic disorder  Social History: Theresa [name] lives with her parents. She is no longer employed. She applied for disability.  Principal Problem: Schizoaffective disorder, bipolar type [facility])  Discharge Diagnoses: Patient Active Problem List   Diagnosis Date Noted  . Schizoaffective disorder, bipolar type (HCC) [F25.0] 10/17/2017  . UTI (urinary tract infection) [N39.0] 09/28/2017  . Undifferentiated schizophrenia (HCC) [F20.3] 09/23/2017  . Bipolar affective disorder (HCC) [F31.9] 09/20/2017  . Bipolar affective disorder, mixed, severe, with psychotic behavior (HCC) [F31.64] 10/04/2016  . PCOS (polycystic ovarian syndrome) [E28.2] 01/18/2016  . Cannabis use disorder, moderate, dependence (HCC) [F12.20] 12/06/2015  . Tobacco use disorder [F17.200] 12/05/2015   Past Medical History:  Past Medical History:  Diagnosis Date  . Abnormal vaginal Pap smear   . Anxiety   . Bipolar disorder (HCC)   . Depression   . Migraine without aura   . Polycystic ovary syndrome   . Psychosis (HCC)   . Schizophrenia (HCC)   . STD (sexually transmitted disease) 10-14-15   chlamydia    History reviewed.  No pertinent surgical history.  Family History:  Family History  Problem Relation Age of Onset  . Hypertension Mother   . Heart disease Mother   . Cancer Mother   . Hypertension Other   . Depression Father   . Drug abuse Father   . Bipolar disorder Father   . Heart disease Maternal Grandmother   . Pancreatitis Maternal Grandfather   . Cancer Paternal Grandmother   . Cancer Paternal Grandfather    Social History:  Social History   Substance and Sexual Activity  Alcohol Use Yes  . Alcohol/week: 0.0 oz     Social History   Substance and Sexual Activity  Drug Use Yes  . Types: Marijuana   Comment: pt denies    Social History   Socioeconomic History  . Marital status: Married    Spouse name: Not on file  . Number of children: Not on file  . Years of education: Not on file  . Highest education level: Not on file  Occupational History  . Not on file  Social Needs  . Financial resource strain: Not on file  . Food insecurity:    Worry: Not on file    Inability: Not on file  . Transportation needs:    Medical: Not on file    Non-medical: Not on file  Tobacco Use  . Smoking status: Current Every Day Smoker    Packs/day: 0.50    Types: Cigarettes  . Smokeless tobacco: Never Used  Substance and Sexual Activity  . Alcohol use: Yes    Alcohol/week: 0.0 oz  . Drug use: Yes    Types: Marijuana    Comment: pt denies  .  Sexual activity: Not Currently    Partners: Male    Birth control/protection: None  Lifestyle  . Physical activity:    Days per week: Not on file    Minutes per session: Not on file  . Stress: Not on file  Relationships  . Social connections:    Talks on phone: Not on file    Gets together: Not on file    Attends religious service: Not on file    Active member of club or organization: Not on file    Attends meetings of clubs or organizations: Not on file    Relationship status: Not on file  Other Topics Concern  . Not on file  Social History  Narrative  . Not on file   Hospital Course: (Per Md's discharge SRA): Theresa [name] is a 22 y/o F with history of schizoaffective disorder bipolar type who was admitted as a walk in with worsening symptoms of AH, delusions, paranoia, mood lability, no sleep for 4 days prior to admission, and episodes resembling catatonia. Pt has recent history of discharge from inpatient psychiatry unit at [facility] on 10/02/17. Pt had poor compliance with her discharge medications, and she was started on trial of Invega during this admission, and she was transitioned to long-acting injectable form of [place] with first dose given on 5/27 and booster planned for 5/30. Pt has demonstrated incremental improvement of her presenting symptoms during her admission.  And besides the [place] 156 mg/ML for mood control, Theresa [name] was also medicated & discharged on; Hydroxyzine 50 mg prn for anxiety & Trazodone 50 mg for insomnia. She was enrolled & participated in the group counseling sessions being offered & held on this unit. She learned coping skills. She presented no other significant pre-existing medical issues that required treatments & or monitoring. She tolerated her treatment regimen without any adverse effects or reactions reported.  Today upon her discharge evaluation, pt shares, "I'm good." She denies any specific concerns. She is sleeping well. Her appetite is good. She physical complaints. She denies SI/HI/AH/VH. She is tolerating her medications without difficulty or side effects. Talked with patient about metabolic side effects risk associated with atypical antipsychotics, and reviewed importance of balanced diet and exercise as well as monitoring weight, lipids, and blood sugar. Pt verbalized good understanding. She plans to follow up at [facility]. She was able to engage in safety planning including plan to return to [facility] or contact emergency services if she feels unable to  maintain her own safety or the safety of others. Pt had no further questions, comments, or concerns.  Upon discharge, Theresa [name] presents mentally & medically stable. She will continue mental health care on an outpatient basis as noted below. She is provided with all the necessary information needed to make this appointment without problems. She received from the [facility] pharmacy, a 7 days worth, supply samples of her [facility] discharge medications. She left [facility] with all personal belongings in no apparent distress. Transportation per mother.  Physical Findings: AIMS: Facial and Oral Movements Muscles of Facial Expression: None, normal Lips and Perioral Area: None, normal Jaw: None, normal Tongue: None, normal,Extremity Movements Upper (arms, wrists, hands, fingers): None, normal Lower (legs, knees, ankles, toes): None, normal, Trunk Movements Neck, shoulders, hips: None, normal, Overall Severity Severity of abnormal movements (highest score from questions above): None, normal Incapacitation due to abnormal movements: None, normal Patient's awareness of abnormal movements (rate only patient's report): No Awareness, Dental Status Current problems with teeth and/or dentures?:  No Does patient usually wear dentures?: No  CIWA:    COWS:     Musculoskeletal: Strength & Muscle Tone: within normal limits Gait & Station: normal Patient leans: N/A  Psychiatric Specialty Exam: Physical Exam  Nursing note and vitals reviewed. Constitutional: She appears well-developed.  HENT:  Head: Normocephalic.  Eyes: Pupils are equal, round, and reactive to light.  Neck: Normal range of motion.  Cardiovascular: Normal rate.  Respiratory: Effort normal.  GI: Soft.  Genitourinary:  Genitourinary Comments: Deferred  Musculoskeletal: Normal range of motion.  Neurological: She is alert.  Skin: Skin is warm.  Psychiatric: She has a normal mood and affect. Her speech is normal and behavior is normal. Thought content  normal. Cognition and memory are normal. She expresses impulsivity.    Review of Systems  Constitutional: Negative.   HENT: Negative.   Eyes: Negative.   Respiratory: Negative.   Cardiovascular: Negative.   Gastrointestinal: Negative.   Genitourinary: Negative.   Musculoskeletal: Negative.   Skin: Negative.   Neurological: Negative.   Endo/Heme/Allergies: Negative.   Psychiatric/Behavioral: Positive for hallucinations (Hx. Psychosis) and substance abuse (Hx. THC use). Negative for depression, memory loss and suicidal ideas. The patient has insomnia (Stabilized with medication prior to discharge). The patient is not nervous/anxious.   All other systems reviewed and are negative.   Blood pressure 108/67, pulse 86, temperature 97.7 F (36.5 C), temperature source Oral, resp. rate 18, height  (1.626 m), weight 81.6 kg (180 lb), SpO2 99 %.Body mass index is 30.9 kg/m.  See Md's SRA   Have you used any form of tobacco in the last 30 days? (Cigarettes, Smokeless Tobacco, Cigars, and/or Pipes): Yes  Has this patient used any form of tobacco in the last 30 days? (Cigarettes, Smokeless Tobacco, Cigars, and/or Pipes): N/A  Blood Alcohol level:  Lab Results  Component Value Date   ETH <10 10/19/2017   ETH <10 09/21/2017    Metabolic Disorder Labs:  Lab Results  Component Value Date   HGBA1C 5.1 10/19/2017   MPG 99.67 10/19/2017   MPG 102.54 09/25/2017   Lab Results  Component Value Date   PROLACTIN 73.5 (H) 10/19/2017   PROLACTIN 4.0 10/14/2015   Lab Results  Component Value Date   CHOL 139 10/19/2017   TRIG 52 10/19/2017   HDL 43 10/19/2017   CHOLHDL 3.2 10/19/2017   VLDL 10 10/19/2017   LDLCALC 86 10/19/2017   LDLCALC 52 09/25/2017   See Psychiatric Specialty Exam and Suicide Risk Assessment completed by Attending Physician prior to discharge.  Discharge destination:  Home  Is patient on multiple antipsychotic therapies at discharge:  No   Has Patient had three  or more failed trials of antipsychotic monotherapy by history:  No  Recommended Plan for Multiple Antipsychotic Therapies: NA  Allergies as of 10/23/2017   No Known Allergies     Medication List    STOP taking these medications   lamoTRIgine 25 MG tablet Commonly known as:  LAMICTAL   OLANZapine 15 MG tablet Commonly known as:  ZYPREXA     TAKE these medications     Indication  albuterol 108 (90 Base) MCG/ACT inhaler Commonly known as:  PROVENTIL HFA;VENTOLIN HFA Inhale 1-2 puffs into the lungs every 6 (six) hours as needed for wheezing or shortness of breath.  Indication:  Asthma   hydrOXYzine 50 MG tablet Commonly known as:  ATARAX/VISTARIL Take 1 tablet (50 mg total) by mouth every 6 (six) hours as needed for anxiety.  Indication:  Feeling Anxious   paliperidone 156 MG/ML Susy injection Commonly known as:  INVEGA SUSTENNA Inject 1 mL (156 mg total) into the muscle every 28 (twenty-eight) days. (Due on 11-20-17): For mood control Start taking on:  11/20/2017  Indication:  Mood control   traZODone 50 MG tablet Commonly known as:  DESYREL Take 1 tablet (50 mg total) by mouth at bedtime as needed for sleep. What changed:    medication strength  how much to take  when to take this  reasons to take this  Indication:  Trouble Sleeping      Follow-up Information    Services, [facility]. Go on 10/27/2017.   Why:  Follow up appointment is scheduled for Monday, 10/27/17, at 8:30am.  Please bring photo ID, social security card, and proof of household income. Contact information: [address] [place] [address] [phone]          Follow-up recommendations: Activity:  As tolerated Diet: As recommended by your primary care doctor. Keep all scheduled follow-up appointments as recommended.  Comments: Patient is instructed prior to discharge to: Take all medications as prescribed by his/her mental healthcare provider. Report any adverse effects and or  reactions from the medicines to his/her outpatient provider promptly. Patient has been instructed & cautioned: To not engage in alcohol and or illegal drug use while on prescription medicines. In the event of worsening symptoms, patient is instructed to call the crisis hotline, 911 and or go to the nearest ED for appropriate evaluation and treatment of symptoms. To follow-up with his/her primary care provider for your other medical issues, concerns and or health care needs.    Signed: Armandina Stammer, NP, PMHNP, FNP-BC 10/23/2017, 10:59 AM   Patient seen, Suicide Assessment Completed.  Disposition Plan Reviewed

## 2017-10-23 NOTE — BHH Suicide Risk Assessment (Signed)
[facility] Discharge Suicide Risk Assessment   Principal Problem: Schizoaffective disorder, bipolar type [facility]) Discharge Diagnoses:  Patient Active Problem List   Diagnosis Date Noted  . Schizoaffective disorder, bipolar type (HCC) [F25.0] 10/17/2017  . UTI (urinary tract infection) [N39.0] 09/28/2017  . Undifferentiated schizophrenia (HCC) [F20.3] 09/23/2017  . Bipolar affective disorder (HCC) [F31.9] 09/20/2017  . Bipolar affective disorder, mixed, severe, with psychotic behavior (HCC) [F31.64] 10/04/2016  . PCOS (polycystic ovarian syndrome) [E28.2] 01/18/2016  . Cannabis use disorder, moderate, dependence (HCC) [F12.20] 12/06/2015  . Tobacco use disorder [F17.200] 12/05/2015    Total Time spent with patient: 30 minutes  Musculoskeletal: Strength & Muscle Tone: within normal limits Gait & Station: normal Patient leans: N/A  Psychiatric Specialty Exam: Review of Systems  Constitutional: Negative for chills and fever.  Respiratory: Negative for cough and shortness of breath.   Cardiovascular: Negative for chest pain.  Gastrointestinal: Negative for abdominal pain, heartburn, nausea and vomiting.  Psychiatric/Behavioral: Negative for depression, hallucinations and suicidal ideas. The patient is not nervous/anxious and does not have insomnia.     Blood pressure 108/67, pulse 86, temperature 97.7 F (36.5 C), temperature source Oral, resp. rate 18, height  (1.626 m), weight 81.6 kg (180 lb), SpO2 99 %.Body mass index is 30.9 kg/m.  General Appearance: Casual  Eye Contact::  Good  Speech:  Clear and Coherent and Normal Rate  Volume:  Normal  Mood:  Euthymic  Affect:  Appropriate and Congruent  Thought Process:  Coherent and Goal Directed  Orientation:  Full (Time, Place, and Person)  Thought Content:  Logical  Suicidal Thoughts:  No  Homicidal Thoughts:  No  Memory:  Immediate;   Fair Recent;   Fair Remote;   Fair  Judgement:  Fair  Insight:  Shallow  Psychomotor  Activity:  Normal  Concentration:  Fair  Recall:  [employer] of Knowledge:Fair  Language: Fair  Akathisia:  No  Handed:    AIMS (if indicated):     Assets:  Resilience  Sleep:  Number of Hours: 5.5  Cognition: WNL  ADL's:  Intact   Mental Status Per Nursing Assessment::   On Admission:  NA  Demographic Factors:  Adolescent or young adult, Caucasian, Low socioeconomic status and Unemployed  Loss Factors: Financial problems/change in socioeconomic status  Historical Factors: Impulsivity  Risk Reduction Factors:   Sense of responsibility to family, Living with another person, especially a relative, Positive social support, Positive therapeutic relationship and Positive coping skills or problem solving skills  Continued Clinical Symptoms:  Bipolar Disorder:   Mixed State Schizophrenia:   Paranoid or undifferentiated type Previous Psychiatric Diagnoses and Treatments  Cognitive Features That Contribute To Risk:  None    Suicide Risk:  Minimal: No identifiable suicidal ideation.  Patients presenting with no risk factors but with morbid ruminations; may be classified as minimal risk based on the severity of the depressive symptoms  Follow-up Information    Services, [facility]. Go on 10/27/2017.   Why:  Follow up appointment is scheduled for Monday, 10/27/17, at 8:30am.  Please bring photo ID, social security card, and proof of household income. Contact information: [address] [place] [address] [phone]         Subjective Data:  Theresa [name] is a 22 y/o F with history of schizoaffective disorder bipolar type who was admitted as a walk in with worsening symptoms of AH, delusions, paranoia, mood lability, no sleep for 4 days prior to admission, and episodes resembling catatonia. Pt  has recent history of discharge from inpatient psychiatry unit at [facility] on 10/02/17. Pt had poor compliance with her discharge medications, and she was  started on trial of Invega during this admission, and she was transitioned to long-acting injectable form of [place] with first dose given on 5/27 and booster planned for 5/30. Pt has demonstrated incremental improvement of her presenting symptoms during her admission.  Today upon evaluation, pt shares, "I'm good." She denies any specific concerns. She is sleeping well. Her appetite is good. She physical complaints. She denies SI/HI/AH/VH. She is tolerating her medications without difficulty or side effects. Talked with patient about metabolic side effects risk associated with atypical antipsychotics, and reviewed importance of balanced diet and exercise as well as monitoring weight, lipids, and blood sugar. Pt verbalized good understanding. She plans to follow up at [facility]. She was able to engage in safety planning including plan to return to [facility] or contact emergency services if she feels unable to maintain her own safety or the safety of others. Pt had no further questions, comments, or concerns.    Plan Of Care/Follow-up recommendations:   -Discharge to outpatient level of care  -Schizoaffective disorder, bipolar type -Continue Gean Birchwood.Invega Sustenna  IM oncegiven on 10/20/17. Prudence Davidson Sustenna  IM q28 days starting on today 10/23/17  -Insomnia: -Continue with Trazodone  po qhs   -Anxiety -Continue atarax  po q8h prn anxiety  Activity:  as tolerated Diet:  normal Tests:  NA Other:  see above for DC plan  Micheal Likens, MD 10/23/2017, 9:10 AM

## 2017-10-23 NOTE — Progress Notes (Signed)
  [facility] Adult Case Management Discharge Plan :  Will you be returning to the same living situation after discharge:  Yes,  home At discharge, do you have transportation home?: Yes,  mother Do you have the ability to pay for your medications: Yes,  mental health  Release of information consent forms completed and in the chart;  Patient's signature needed at discharge.  Patient to Follow up at: Follow-up Information    Services, [facility]. Go on 10/27/2017.   Why:  Follow up appointment is scheduled for Monday, 10/27/17, at 8:30am.  Please bring photo ID, social security card, and proof of household income. Contact information: [address] [place] [address] [phone]           Next level of care provider has access to [facility] Link:no  Safety Planning and Suicide Prevention discussed: Yes,  yes  Have you used any form of tobacco in the last 30 days? (Cigarettes, Smokeless Tobacco, Cigars, and/or Pipes): Yes  Has patient been referred to the Quitline?: Patient refused referral  Patient has been referred for addiction treatment: N/A  Ida Rogue, LCSW 10/23/2017, 9:13 AM

## 2017-10-23 NOTE — Progress Notes (Signed)
Recreation Therapy Notes  Date: 5.30.19 Time: 1000 Location: 500 Hall Dayroom    Group Topic: Communication  Goal Area(s) Addresses:  Patient will effectively communicate with peers in group.  Patient will verbalize benefit of healthy communication. Patient will verbalize positive effect of healthy communication on post d/c goals.  Patient will identify communication techniques that made activity effective for group.   Intervention: Geometrical Drawings  Activity: Back to Back Drawings.  Patients were paired into groups of two.  Each group was given a picture of geometrical shapes.  One person from each gave the instructions on how to draw the picture, while the other person listened so they could draw it.  Once finished the pair would compare the drawing with the original to see how close they came.  Patients would then receive and different picture and switch roles.  Education: Communication, Discharge Planning  Education Outcome: Acknowledges understanding/In group clarification offered/Needs additional education.   Clinical Observations/Feedback: Pt did not attend group.    Caroll Rancher, LRT/CTRS         Lillia Abed, Marsh Heckler A 10/23/2017 11:40 AM

## 2017-10-23 NOTE — Plan of Care (Signed)
Patient verbalizes readiness for discharge. Follow up plan explained, AVS, Transition record and SRA given. Prescriptions and teaching provided. Belongings returned and signed for. Suicide safety plan completed and signed. Patient verbalizes understanding. Patient denies SI/HI and assures this Clinical research associate he will seek assistance should that change. Patient discharged to lobby where mother was waiting.  Problem: Education: Goal: Knowledge of [facility] Education information/materials will improve Outcome: Adequate for Discharge Goal: Emotional status will improve Outcome: Adequate for Discharge Goal: Mental status will improve Outcome: Adequate for Discharge Goal: Verbalization of understanding the information provided will improve Outcome: Adequate for Discharge   Problem: Coping: Goal: Ability to identify and develop effective coping behavior will improve Outcome: Adequate for Discharge   Problem: Activity: Goal: Interest or engagement in leisure activities will improve Outcome: Adequate for Discharge Goal: Imbalance in normal sleep/wake cycle will improve Outcome: Adequate for Discharge   Problem: Self-Concept: Goal: Will verbalize positive feelings about self Outcome: Adequate for Discharge   Problem: Spiritual Needs Goal: Ability to function at adequate level Outcome: Adequate for Discharge

## 2018-01-07 ENCOUNTER — Encounter (HOSPITAL_COMMUNITY): Payer: Self-pay | Admitting: Emergency Medicine

## 2018-01-07 ENCOUNTER — Emergency Department (HOSPITAL_COMMUNITY)
Admission: EM | Admit: 2018-01-07 | Discharge: 2018-01-07 | Disposition: A | Discharge status: Home / Self Care | Payer: Medicaid Other | Attending: Emergency Medicine | Admitting: Emergency Medicine

## 2018-01-07 ENCOUNTER — Emergency Department (HOSPITAL_COMMUNITY): Payer: Medicaid Other

## 2018-01-07 DIAGNOSIS — M25572 Pain in left ankle and joints of left foot: Secondary | ICD-10-CM | POA: Diagnosis not present

## 2018-01-07 DIAGNOSIS — F17210 Nicotine dependence, cigarettes, uncomplicated: Secondary | ICD-10-CM | POA: Diagnosis not present

## 2018-01-07 DIAGNOSIS — F250 Schizoaffective disorder, bipolar type: Secondary | ICD-10-CM | POA: Diagnosis not present

## 2018-01-07 DIAGNOSIS — Z79899 Other long term (current) drug therapy: Secondary | ICD-10-CM | POA: Diagnosis not present

## 2018-01-07 DIAGNOSIS — M25571 Pain in right ankle and joints of right foot: Secondary | ICD-10-CM | POA: Diagnosis not present

## 2018-01-07 DIAGNOSIS — R600 Localized edema: Secondary | ICD-10-CM | POA: Diagnosis not present

## 2018-01-07 LAB — I-STAT TROPONIN, ED: Troponin i, poc: 0 ng/mL (ref 0.00–0.08)

## 2018-01-07 LAB — BASIC METABOLIC PANEL
Anion gap: 7 (ref 5–15)
BUN: 11 mg/dL (ref 6–20)
CO2: 30 mmol/L (ref 22–32)
Calcium: 8.5 mg/dL — ABNORMAL LOW (ref 8.9–10.3)
Chloride: 101 mmol/L (ref 98–111)
Creatinine, Ser: 0.82 mg/dL (ref 0.44–1.00)
GFR calc Af Amer: >60 mL/min (ref >60)
GFR calc non Af Amer: >60 mL/min (ref >60)
Glucose, Bld: 102 mg/dL — ABNORMAL HIGH (ref 70–99)
Potassium: 3.9 mmol/L (ref 3.5–5.1)
Sodium: 138 mmol/L (ref 135–145)

## 2018-01-07 LAB — CBC WITH DIFFERENTIAL/PLATELET
Basophils Absolute: 0 10*3/uL (ref 0.0–0.1)
Basophils Relative: 0 %
Eosinophils Absolute: 0.2 10*3/uL (ref 0.0–0.7)
Eosinophils Relative: 3 %
HCT: 40.3 % (ref 36.0–46.0)
Hemoglobin: 13.4 g/dL (ref 12.0–15.0)
Lymphocytes Relative: 48 %
Lymphs Abs: 3.2 10*3/uL (ref 0.7–4.0)
MCH: 31.1 pg (ref 26.0–34.0)
MCHC: 33.3 g/dL (ref 30.0–36.0)
MCV: 93.5 fL (ref 78.0–100.0)
Monocytes Absolute: 0.5 10*3/uL (ref 0.1–1.0)
Monocytes Relative: 7 %
Neutro Abs: 2.9 10*3/uL (ref 1.7–7.7)
Neutrophils Relative %: 42 %
Platelets: 280 10*3/uL (ref 150–400)
RBC: 4.31 MIL/uL (ref 3.87–5.11)
RDW: 12.8 % (ref 11.5–15.5)
WBC: 6.8 10*3/uL (ref 4.0–10.5)

## 2018-01-07 LAB — BRAIN NATRIURETIC PEPTIDE: B Natriuretic Peptide: 66.6 pg/mL (ref 0.0–100.0)

## 2018-01-07 LAB — URINALYSIS, ROUTINE W REFLEX MICROSCOPIC
Bilirubin Urine: NEGATIVE
Glucose, UA: NEGATIVE mg/dL
Hgb urine dipstick: NEGATIVE
Ketones, ur: NEGATIVE mg/dL
Leukocytes, UA: NEGATIVE
Nitrite: NEGATIVE
Protein, ur: NEGATIVE mg/dL
Specific Gravity, Urine: 1.012 (ref 1.005–1.030)
pH: 5 (ref 5.0–8.0)

## 2018-01-07 LAB — I-STAT BETA HCG BLOOD, ED (MC, WL, AP ONLY): I-stat hCG, quantitative: <5 m[IU]/mL (ref <5)

## 2018-01-07 MED ORDER — KETOROLAC TROMETHAMINE 30 MG/ML IJ SOLN
30.0000 mg | Freq: Once | INTRAMUSCULAR | Status: AC
  Administered 2018-01-07: 30 mg via INTRAVENOUS
  Filled 2018-01-07: qty 1

## 2018-01-07 NOTE — ED Provider Notes (Signed)
[facility]-EMERGENCY DEPT Provider Note   CSN: [id number] Arrival date & time: 01/07/18  1351     History   Chief Complaint Chief Complaint  Patient presents with  . Foot Swelling    bilat    HPI Theresa [name] is a 22 y.o. female with history of bipolar disorder, migraines, PCOS, schizophrenia presents for evaluation of acute onset, progressively worsening bilateral foot and ankle swelling for 3 days.  Patient notes a constant pressure-like sensation which worsens with ambulation.  She is attempted elevating her feet and applying ice without improvement in her symptoms.  Denies recent injury but notes injury to the left ankle several years ago.  She notes that she will have intermittent swelling to that ankle since then but never to this extent and never bilaterally.  She denies recent travel or surgeries, no hemoptysis, no prior history of DVT or PE, and she is not on OCPs.  Last night she did note development of orthopnea but denies PND or DOE.  She denies shortness of breath otherwise or chest pains.  No fevers, chills, or cough.  The history is provided by the patient.    Past Medical History:  Diagnosis Date  . Abnormal vaginal Pap smear   . Anxiety   . Bipolar disorder (HCC)   . Depression   . Migraine without aura   . Polycystic ovary syndrome   . Psychosis (HCC)   . Schizophrenia (HCC)   . STD (sexually transmitted disease) 10-14-15   chlamydia     Patient Active Problem List   Diagnosis Date Noted  . Schizoaffective disorder, bipolar type (HCC) 10/17/2017  . UTI (urinary tract infection) 09/28/2017  . 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 Bipolar affective disorder (HCC) 09/20/2017  . Bipolar affective disorder, mixed, severe, with psychotic behavior (HCC) 10/04/2016  . PCOS (polycystic ovarian syndrome) 01/18/2016  . Cannabis use disorder, moderate, dependence (HCC) 12/06/2015  . Tobacco use disorder 12/05/2015     History reviewed. No pertinent surgical history.   OB History    Gravida  0   Para  0   Term  0   Preterm  0   AB  0   Living  0     SAB  0   TAB  0   Ectopic  0   Multiple  0   Live Births               Home Medications    Prior to Admission medications   Medication Sig Start Date End Date Taking? Authorizing Provider  hydrOXYzine (ATARAX/VISTARIL) 50 MG tablet Take 1 tablet (50 mg total) by mouth every 6 (six) hours as needed for anxiety. 10/23/17  Yes Armandina Stammer I, NP  ibuprofen (ADVIL,MOTRIN) 200 MG tablet Take 800 mg by mouth 2 (two) times daily as needed (pain).   Yes [provider]  risperiDONE (RISPERDAL) 1 MG tablet Take 1 mg by mouth daily. 10/29/17  Yes [provider]  traZODone (DESYREL) 50 MG tablet Take 1 tablet (50 mg total) by mouth at bedtime as needed for sleep. 10/23/17  Yes Armandina Stammer I, NP  albuterol (PROVENTIL HFA;VENTOLIN HFA) 108 (90 Base) MCG/ACT inhaler Inhale 1-2 puffs into the lungs every 6 (six) hours as needed for wheezing or shortness of breath. Patient not taking: Reported on 01/07/2018 10/23/17   Armandina Stammer I, NP  paliperidone (INVEGA SUSTENNA) 156 MG/ML SUSY injection Inject 1 mL (156 mg total) into the muscle every 28 (twenty-eight)  days. (Due on 11-20-17): For mood control Patient not taking: Reported on 01/07/2018 11/20/17   Sanjuana KavaNwoko, Agnes I, NP    Family History Family History  Problem Relation Age of Onset  . Hypertension Mother   . Heart disease Mother   . Cancer Mother   . Hypertension Other   . Depression Father   . Drug abuse Father   . Bipolar disorder Father   . Heart disease Maternal Grandmother   . Pancreatitis Maternal Grandfather   . Cancer Paternal Grandmother   . Cancer Paternal Grandfather     Social History Social History   Tobacco Use  . Smoking status: Current Every Day Smoker    Packs/day: 0.50    Types: Cigarettes  . Smokeless tobacco: Never Used  Substance Use Topics   . Alcohol use: Yes    Alcohol/week: 0.0 standard drinks  . Drug use: Yes    Types: Marijuana    Comment: pt denies     Allergies   Patient has no known allergies.   Review of Systems Review of Systems  Constitutional: Negative for chills and fever.  Respiratory: Positive for shortness of breath.   Cardiovascular: Positive for leg swelling. Negative for chest pain.  Gastrointestinal: Negative for abdominal pain, nausea and vomiting.  Musculoskeletal: Positive for arthralgias.  All other systems reviewed and are negative.    Physical Exam Updated Vital Signs BP 108/81   Pulse 72   Temp 98.5 F (36.9 C) (Oral)   Resp 18   LMP 10/18/2017   SpO2 96%   Physical Exam  Constitutional: She appears well-developed and well-nourished. No distress.  Appears somewhat uncomfortable, resting in bed  HENT:  Head: Normocephalic and atraumatic.  Eyes: Conjunctivae are normal. Right eye exhibits no discharge. Left eye exhibits no discharge.  Neck: Normal range of motion. Neck supple. No JVD present. No tracheal deviation present.  Cardiovascular: Normal rate, regular rhythm, normal heart sounds and intact distal pulses.  2+ radial and DP/PT pulses bilaterally, Homans sign absent bilaterally,no palpable cords, compartments are soft. Patient has 2+ nonpitting edema of the bilateral lower extremities.   Pulmonary/Chest: Effort normal and breath sounds normal. No stridor. No respiratory distress. She has no wheezes. She has no rales. She exhibits no tenderness.  Equal rise and fall of chest, no increased work of breathing.  Speaking in full sentences without difficulty.  Abdominal: Soft. Bowel sounds are normal. She exhibits no distension. There is no tenderness.  Musculoskeletal: Normal range of motion. She exhibits tenderness. She exhibits no edema.  There is tenderness to palpation overlying the lateral and medial aspects of the distal bilateral lower extremities superior to the medial and  lateral malleoli.  She also has tenderness to palpation of the anterior aspect of the bilateral ankles.  5/5 strength of BLE major muscle groups.  No ecchymosis, crepitus, erythema, or warmth.  There is generalized edema of the bilateral feet and ankles extending to the distal lower legs.   Neurological: She is alert.  Fluent speech with no evidence of dysarthria or aphasia, no facial droop, sensation intact to soft touch of bilateral lower extremities.  Patient ambulates with an antalgic gait, able to Heel Walk and Toe Walk despite pain.  Skin: No erythema.  Psychiatric: She has a normal mood and affect. Her behavior is normal.  Nursing note and vitals reviewed.    ED Treatments / Results  Labs (all labs ordered are listed, but only abnormal results are displayed) Labs Reviewed  BASIC METABOLIC PANEL -  Abnormal; Notable for the following components:      Result Value   Glucose, Bld 102 (*)    Calcium 8.5 (*)    All other components within normal limits  BRAIN NATRIURETIC PEPTIDE  CBC WITH DIFFERENTIAL/PLATELET  URINALYSIS, ROUTINE W REFLEX MICROSCOPIC  I-STAT TROPONIN, ED  I-STAT BETA HCG BLOOD, ED ([facility], [facility], [facility] ONLY)    EKG EKG Interpretation  Date/Time:  Wednesday January 07 2018 16:09:23 EDT Ventricular Rate:  77 PR Interval:    QRS Duration: 108 QT Interval:  395 QTC Calculation: 447 R Axis:   96 Text Interpretation:  Sinus rhythm Borderline right axis deviation Borderline Q waves in inferior leads Confirmed by Lorre NickAllen, Anthony ([id number]) on 01/07/2018 5:24:41 PM   Radiology Dg Chest 2 View  Result Date: 01/07/2018 CLINICAL DATA:  Shortness of breath with back and chest pain EXAM: CHEST - 2 VIEW COMPARISON:  10/26/2016 FINDINGS: The heart size and mediastinal contours are within normal limits. Both lungs are clear. The visualized skeletal structures are unremarkable. IMPRESSION: No active cardiopulmonary disease. Electronically Signed   By: Jasmine PangKim  Fujinaga M.D.   On: 01/07/2018  17:06    Procedures Procedures (including critical care time)  Medications Ordered in ED Medications  ketorolac (TORADOL) 30 MG/ML injection 30 mg (30 mg Intravenous Given 01/07/18 1721)     Initial Impression / Assessment and Plan / ED Course  I have reviewed the triage vital signs and the nursing notes.  Pertinent labs & imaging results that were available during my care of the patient were reviewed by me and considered in my medical decision making (see chart for details).     Patient presents for evaluation of acute onset of bilateral lower extremity edema localized to the feet and ankles as well as foot and ankle pain.  She is afebrile, vital signs are stable.  She is nontoxic in appearance.  She had mild orthopnea last night.  EKG shows normal sinus rhythm, no ischemic changes.  Troponin is within normal limits.  I doubt ACS/MI in the absence of chest pains or risk factors.  She is PERC negative and I doubt DVT/PE.  Chest x-ray shows no evidence of any active cardiopulmonary disease including edema or consolidation.  Doubt pneumonia, dissection, pericarditis, myocarditis, or CHF.  BNP within normal limits.  Remainder of lab work reviewed by me is reassuring with no leukocytosis, no anemia, no metabolic derangements. No signs of secondary skin infection.  Patient ambulated with pulse oximetry which remained stable, no tachycardia or increased work of breathing.  No concern for septic joint.  No further emergent work-up required at this time.  Stable for discharge home with instructions for compression stockings, reduction of sodium in diet.  Recommend follow-up with PCP for reevaluation of symptoms.  Discussed strict ED return precautions. Pt and mother verbalized understanding of and agreement with plan and patient is safe for discharge home at this time.   Final Clinical Impressions(s) / ED Diagnoses   Final diagnoses:  Bilateral lower extremity edema  Pain of joint of left ankle and  foot  Pain in joint involving right ankle and foot    ED Discharge Orders         Ordered    Compression stockings     01/07/18 1847           Jeanie SewerFawze, Sonam Huelsmann A, PA-C 01/07/18 1902    Lorre NickAllen, Anthony, MD 01/08/18 2241

## 2018-01-07 NOTE — Discharge Instructions (Signed)
Alternate 600 mg of ibuprofen and [phone] mg of Tylenol every 3 hours as needed for pain. Do not exceed 4000 mg of Tylenol daily.  Take ibuprofen with food to avoid upset stomach issues.  Wear compression stockings, elevate the extremities on 2-3 pillows when you are not walking.  This will help with your swelling.  Follow-up with a primary care physician for reevaluation of your leg swelling.  Follow-up with an OB/GYN for reevaluation of your abnormal periods.  However, I do recommend that you take the birth control and metformin that was prescribed to you on a regular basis which will help manage your irregular periods due to your PCOS.  Return to the emergency department if any worsening signs or symptoms develop such as shortness of breath, chest pains, fevers, worsening swelling, redness of the skin, or worsening pain.

## 2018-01-07 NOTE — ED Notes (Signed)
Pt ambulated O2 remained at 98 on room air

## 2018-01-07 NOTE — ED Triage Notes (Signed)
Pt reports bilat foot swelling for couple days. Denies any falls or injuries. Couldn't go to work today due to shoes not fitting and pain.

## 2018-05-13 ENCOUNTER — Emergency Department (HOSPITAL_COMMUNITY)
Admission: EM | Admit: 2018-05-13 | Discharge: 2018-05-13 | Disposition: A | Discharge status: Home / Self Care | Payer: Medicaid Other | Attending: Emergency Medicine | Admitting: Emergency Medicine

## 2018-05-13 ENCOUNTER — Encounter (HOSPITAL_COMMUNITY): Payer: Self-pay | Admitting: Emergency Medicine

## 2018-05-13 DIAGNOSIS — F17210 Nicotine dependence, cigarettes, uncomplicated: Secondary | ICD-10-CM | POA: Insufficient documentation

## 2018-05-13 DIAGNOSIS — F1210 Cannabis abuse, uncomplicated: Secondary | ICD-10-CM | POA: Diagnosis not present

## 2018-05-13 DIAGNOSIS — F1110 Opioid abuse, uncomplicated: Secondary | ICD-10-CM | POA: Insufficient documentation

## 2018-05-13 DIAGNOSIS — F1910 Other psychoactive substance abuse, uncomplicated: Secondary | ICD-10-CM

## 2018-05-13 DIAGNOSIS — Z79899 Other long term (current) drug therapy: Secondary | ICD-10-CM | POA: Insufficient documentation

## 2018-05-13 LAB — RAPID URINE DRUG SCREEN, HOSP PERFORMED
Amphetamines: NOT DETECTED
Barbiturates: NOT DETECTED
Benzodiazepines: NOT DETECTED
Cocaine: NOT DETECTED
Opiates: NOT DETECTED
Tetrahydrocannabinol: POSITIVE — AB

## 2018-05-13 LAB — URINALYSIS, ROUTINE W REFLEX MICROSCOPIC
Bilirubin Urine: NEGATIVE
Glucose, UA: NEGATIVE mg/dL
Hgb urine dipstick: NEGATIVE
Ketones, ur: NEGATIVE mg/dL
Leukocytes, UA: NEGATIVE
Nitrite: NEGATIVE
Protein, ur: NEGATIVE mg/dL
Specific Gravity, Urine: 1.003 — ABNORMAL LOW (ref 1.005–1.030)
pH: 7 (ref 5.0–8.0)

## 2018-05-13 LAB — CBC WITH DIFFERENTIAL/PLATELET
Abs Immature Granulocytes: 0.03 10*3/uL (ref 0.00–0.07)
Basophils Absolute: 0.1 10*3/uL (ref 0.0–0.1)
Basophils Relative: 1 %
Eosinophils Absolute: 0.1 10*3/uL (ref 0.0–0.5)
Eosinophils Relative: 1 %
HCT: 44 % (ref 36.0–46.0)
Hemoglobin: 14.8 g/dL (ref 12.0–15.0)
Immature Granulocytes: 0 %
Lymphocytes Relative: 39 %
Lymphs Abs: 3.7 10*3/uL (ref 0.7–4.0)
MCH: 31.3 pg (ref 26.0–34.0)
MCHC: 33.6 g/dL (ref 30.0–36.0)
MCV: 93 fL (ref 80.0–100.0)
Monocytes Absolute: 0.6 10*3/uL (ref 0.1–1.0)
Monocytes Relative: 6 %
Neutro Abs: 5 10*3/uL (ref 1.7–7.7)
Neutrophils Relative %: 53 %
Platelets: 344 10*3/uL (ref 150–400)
RBC: 4.73 MIL/uL (ref 3.87–5.11)
RDW: 12.8 % (ref 11.5–15.5)
WBC: 9.5 10*3/uL (ref 4.0–10.5)
nRBC: 0 % (ref 0.0–0.2)

## 2018-05-13 LAB — I-STAT BETA HCG BLOOD, ED (MC, WL, AP ONLY): I-stat hCG, quantitative: <5 m[IU]/mL (ref <5)

## 2018-05-13 LAB — ACETAMINOPHEN LEVEL: Acetaminophen (Tylenol), Serum: <10 ug/mL — ABNORMAL LOW (ref 10–30)

## 2018-05-13 LAB — BASIC METABOLIC PANEL
Anion gap: 11 (ref 5–15)
BUN: 8 mg/dL (ref 6–20)
CO2: 25 mmol/L (ref 22–32)
Calcium: 9.1 mg/dL (ref 8.9–10.3)
Chloride: 105 mmol/L (ref 98–111)
Creatinine, Ser: 0.75 mg/dL (ref 0.44–1.00)
GFR calc Af Amer: >60 mL/min (ref >60)
GFR calc non Af Amer: >60 mL/min (ref >60)
Glucose, Bld: 105 mg/dL — ABNORMAL HIGH (ref 70–99)
Potassium: 3.4 mmol/L — ABNORMAL LOW (ref 3.5–5.1)
Sodium: 141 mmol/L (ref 135–145)

## 2018-05-13 LAB — SALICYLATE LEVEL: Salicylate Lvl: <7 mg/dL (ref 2.8–30.0)

## 2018-05-13 LAB — ETHANOL: Alcohol, Ethyl (B): <10 mg/dL (ref <10)

## 2018-05-13 MED ORDER — PALIPERIDONE PALMITATE ER 156 MG/ML IM SUSY: 156.0000 mg | PREFILLED_SYRINGE | Freq: Once | INTRAMUSCULAR | Status: DC

## 2018-05-13 NOTE — ED Notes (Signed)
Pt denies SI/HI/AVH. Pt given discharge instructions. Pt states understanding. Pt states receipt of all belongings.   

## 2018-05-13 NOTE — ED Provider Notes (Signed)
[facility]-EMERGENCY DEPT Provider Note   CSN: [id number] Arrival date & time: 05/13/18  1920     History   Chief Complaint Chief Complaint  Patient presents with  . Medical Clearance    HPI Theresa [name] is a 22 y.o. female.  HPI Patient reports that she has been abusing heroin and marijuana to self medicate for her schizophrenia and bipolar.  She reports that she snorts it, no injecting.  She uses marijuana to treat her anxiety. She wants to get back on Invega which she reports is the only thing that seems to work for her schizophrenia. She reports not getting her psych medications has contibuted to her rebound use of heroin and marijuana Past Medical History:  Diagnosis Date  . Abnormal vaginal Pap smear   . Anxiety   . Bipolar disorder (HCC)   . Depression   . Migraine without aura   . Polycystic ovary syndrome   . Psychosis (HCC)   . Schizophrenia (HCC)   . STD (sexually transmitted disease) 10-14-15   chlamydia     Patient Active Problem List   Diagnosis Date Noted  . Schizoaffective disorder, bipolar type (HCC) 10/17/2017  . UTI (urinary tract infection) 09/28/2017  . 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 Bipolar affective disorder (HCC) 09/20/2017  . Bipolar affective disorder, mixed, severe, with psychotic behavior (HCC) 10/04/2016  . PCOS (polycystic ovarian syndrome) 01/18/2016  . Cannabis use disorder, moderate, dependence (HCC) 12/06/2015  . Tobacco use disorder 12/05/2015    History reviewed. No pertinent surgical history.   OB History    Gravida  0   Para  0   Term  0   Preterm  0   AB  0   Living  0     SAB  0   TAB  0   Ectopic  0   Multiple  0   Live Births               Home Medications    Prior to Admission medications   Medication Sig Start Date End Date Taking? Authorizing Provider  hydrOXYzine (ATARAX/VISTARIL) 50 MG tablet Take 1 tablet (50 mg total) by mouth every 6  (six) hours as needed for anxiety. 10/23/17  Yes Armandina Stammer I, NP  albuterol (PROVENTIL HFA;VENTOLIN HFA) 108 (90 Base) MCG/ACT inhaler Inhale 1-2 puffs into the lungs every 6 (six) hours as needed for wheezing or shortness of breath. Patient not taking: Reported on 01/07/2018 10/23/17   Armandina Stammer I, NP  ibuprofen (ADVIL,MOTRIN) 200 MG tablet Take 800 mg by mouth 2 (two) times daily as needed (pain).    [provider]  paliperidone (INVEGA SUSTENNA) 156 MG/ML SUSY injection Inject 1 mL (156 mg total) into the muscle every 28 (twenty-eight) days. (Due on 11-20-17): For mood control Patient not taking: Reported on 01/07/2018 11/20/17   Armandina Stammer I, NP  traZODone (DESYREL) 50 MG tablet Take 1 tablet (50 mg total) by mouth at bedtime as needed for sleep. 10/23/17   Sanjuana Kava, NP    Family History Family History  Problem Relation Age of Onset  . Hypertension Mother   . Heart disease Mother   . Cancer Mother   . Hypertension Other   . Depression Father   . Drug abuse Father   . Bipolar disorder Father   . Heart disease Maternal Grandmother   . Pancreatitis Maternal Grandfather   . Cancer Paternal Grandmother   . Cancer Paternal Grandfather  Social History Social History   Tobacco Use  . Smoking status: Current Every Day Smoker    Packs/day: 0.50    Types: Cigarettes  . Smokeless tobacco: Never Used  Substance Use Topics  . Alcohol use: Yes    Alcohol/week: 0.0 standard drinks  . Drug use: Yes    Types: Marijuana    Comment: pt denies     Allergies   Patient has no known allergies.   Review of Systems Review of Systems 10 Systems reviewed and are negative for acute change except as noted in the HPI.   Physical Exam Updated Vital Signs BP (!) 110/51 (BP Location: Right Arm)   Pulse (!) 55   Temp 98.8 F (37.1 C)   Resp 20   Ht 5\' 3"  (1.6 m)   Wt 83.9 kg   SpO2 99%   BMI 32.77 kg/m   Physical Exam Constitutional:      Appearance: Normal  appearance. She is well-developed.  HENT:     Head: Normocephalic and atraumatic.     Nose: Nose normal.     Mouth/Throat:     Mouth: Mucous membranes are moist.  Eyes:     Extraocular Movements: Extraocular movements intact.     Pupils: Pupils are equal, round, and reactive to light.  Neck:     Musculoskeletal: Neck supple.  Cardiovascular:     Rate and Rhythm: Normal rate and regular rhythm.     Heart sounds: Normal heart sounds.  Pulmonary:     Effort: Pulmonary effort is normal.     Breath sounds: Normal breath sounds.  Abdominal:     General: Bowel sounds are normal. There is no distension.     Palpations: Abdomen is soft.     Tenderness: There is no abdominal tenderness.  Musculoskeletal: Normal range of motion.  Skin:    General: Skin is warm and dry.  Neurological:     Mental Status: She is alert and oriented to person, place, and time.     GCS: GCS eye subscore is 4. GCS verbal subscore is 5. GCS motor subscore is 6.     Coordination: Coordination normal.  Psychiatric:        Mood and Affect: Mood normal.      ED Treatments / Results  Labs (all labs ordered are listed, but only abnormal results are displayed) Labs Reviewed  BASIC METABOLIC PANEL - Abnormal; Notable for the following components:      Result Value   Potassium 3.4 (*)    Glucose, Bld 105 (*)    All other components within normal limits  ACETAMINOPHEN LEVEL - Abnormal; Notable for the following components:   Acetaminophen (Tylenol), Serum <10 (*)    All other components within normal limits  URINALYSIS, ROUTINE W REFLEX MICROSCOPIC - Abnormal; Notable for the following components:   Color, Urine STRAW (*)    Specific Gravity, Urine 1.003 (*)    All other components within normal limits  RAPID URINE DRUG SCREEN, HOSP PERFORMED - Abnormal; Notable for the following components:   Tetrahydrocannabinol POSITIVE (*)    All other components within normal limits  CBC WITH DIFFERENTIAL/PLATELET    ETHANOL  SALICYLATE LEVEL  I-STAT BETA HCG BLOOD, ED ([facility], [facility], [facility] ONLY)    EKG None  Radiology No results found.  Procedures Procedures (including critical care time)  Medications Ordered in ED Medications - No data to display   Initial Impression / Assessment and Plan / ED Course  I have reviewed  the triage vital signs and the nursing notes.  Pertinent labs & imaging results that were available during my care of the patient were reviewed by me and considered in my medical decision making (see chart for details).    Patient is clinically well in appearance.  She has no suicidal or homicidal ideations.  She does not show physiological signs of acute drug withdrawal.  Patient wants to be given her in [place].  This is not immediately available for administration in the ED.  Patient also advised that 1 of her providers wanted her to take in [place] and 1 of the others did not.  Patient will need to resolve this with her outpatient psychiatrist.  She is otherwise alert and appropriate.  She is stable for discharge.  Resources are provided for substance abuse counseling and treatment programs.  Final Clinical Impressions(s) / ED Diagnoses   Final diagnoses:  Polysubstance abuse [facility](HCC)    ED Discharge Orders    None       Arby BarrettePfeiffer, Jaymin Waln, MD 05/16/18 [phone]

## 2018-05-13 NOTE — Discharge Instructions (Addendum)
1.  Use the attached resource guide to find a mental health care center.  Also, you need a family doctor.  Use the referral number in your discharge instructions to find one. 2.  Hinda Glatternvega is not available in the emergency department at this time.  You must speak with your psychiatrist or behavioral health specialist to resume this medication

## 2018-05-13 NOTE — ED Triage Notes (Addendum)
Patient [facility] and she haven't taken any of her psych medication since then. Pt stated they discharged her with just Trazodone and Vistaril. Pt stated she's been self medicating with marijuana and heroin since then. Pt stated "I felt withdrawing from medication and I need some help". Patient denies SI/HI.

## 2018-05-13 NOTE — ED Notes (Signed)
Gold top tube in lab if needed

## 2018-05-13 NOTE — ED Notes (Signed)
Bed: WA28 Expected date:  Expected time:  Means of arrival:  Comments: Triage 4  

## 2018-06-29 ENCOUNTER — Other Ambulatory Visit: Payer: Self-pay

## 2018-06-29 ENCOUNTER — Emergency Department (HOSPITAL_COMMUNITY)
Admission: EM | Admit: 2018-06-29 | Discharge: 2018-06-30 | Disposition: A | Discharge status: Other Acute Inpatient Hospital | Payer: Medicaid Other | Attending: Emergency Medicine | Admitting: Emergency Medicine

## 2018-06-29 ENCOUNTER — Encounter (HOSPITAL_COMMUNITY): Payer: Self-pay | Admitting: Emergency Medicine

## 2018-06-29 DIAGNOSIS — F250 Schizoaffective disorder, bipolar type: Secondary | ICD-10-CM | POA: Diagnosis not present

## 2018-06-29 DIAGNOSIS — F209 Schizophrenia, unspecified: Secondary | ICD-10-CM | POA: Insufficient documentation

## 2018-06-29 DIAGNOSIS — Z79899 Other long term (current) drug therapy: Secondary | ICD-10-CM | POA: Insufficient documentation

## 2018-06-29 DIAGNOSIS — F17210 Nicotine dependence, cigarettes, uncomplicated: Secondary | ICD-10-CM | POA: Diagnosis not present

## 2018-06-29 DIAGNOSIS — Z7982 Long term (current) use of aspirin: Secondary | ICD-10-CM | POA: Diagnosis not present

## 2018-06-29 DIAGNOSIS — R443 Hallucinations, unspecified: Secondary | ICD-10-CM | POA: Diagnosis present

## 2018-06-29 LAB — CBC WITH DIFFERENTIAL/PLATELET
Abs Immature Granulocytes: 0.02 10*3/uL (ref 0.00–0.07)
Basophils Absolute: 0.1 10*3/uL (ref 0.0–0.1)
Basophils Relative: 1 %
Eosinophils Absolute: 0 10*3/uL (ref 0.0–0.5)
Eosinophils Relative: 0 %
HCT: 47 % — ABNORMAL HIGH (ref 36.0–46.0)
Hemoglobin: 15.5 g/dL — ABNORMAL HIGH (ref 12.0–15.0)
Immature Granulocytes: 0 %
Lymphocytes Relative: 27 %
Lymphs Abs: 2.7 10*3/uL (ref 0.7–4.0)
MCH: 30.4 pg (ref 26.0–34.0)
MCHC: 33 g/dL (ref 30.0–36.0)
MCV: 92.2 fL (ref 80.0–100.0)
Monocytes Absolute: 0.6 10*3/uL (ref 0.1–1.0)
Monocytes Relative: 6 %
Neutro Abs: 6.5 10*3/uL (ref 1.7–7.7)
Neutrophils Relative %: 66 %
Platelets: 458 10*3/uL — ABNORMAL HIGH (ref 150–400)
RBC: 5.1 MIL/uL (ref 3.87–5.11)
RDW: 12 % (ref 11.5–15.5)
WBC: 9.8 10*3/uL (ref 4.0–10.5)
nRBC: 0 % (ref 0.0–0.2)

## 2018-06-29 LAB — COMPREHENSIVE METABOLIC PANEL
ALT: 16 U/L (ref 0–44)
AST: 18 U/L (ref 15–41)
Albumin: 5.4 g/dL — ABNORMAL HIGH (ref 3.5–5.0)
Alkaline Phosphatase: 87 U/L (ref 38–126)
Anion gap: 12 (ref 5–15)
BUN: 11 mg/dL (ref 6–20)
CO2: 22 mmol/L (ref 22–32)
Calcium: 9.9 mg/dL (ref 8.9–10.3)
Chloride: 105 mmol/L (ref 98–111)
Creatinine, Ser: 0.76 mg/dL (ref 0.44–1.00)
GFR calc Af Amer: >60 mL/min (ref >60)
GFR calc non Af Amer: >60 mL/min (ref >60)
Glucose, Bld: 111 mg/dL — ABNORMAL HIGH (ref 70–99)
Potassium: 3.5 mmol/L (ref 3.5–5.1)
Sodium: 139 mmol/L (ref 135–145)
Total Bilirubin: 0.7 mg/dL (ref 0.3–1.2)
Total Protein: 8.9 g/dL — ABNORMAL HIGH (ref 6.5–8.1)

## 2018-06-29 LAB — ETHANOL: Alcohol, Ethyl (B): <10 mg/dL (ref <10)

## 2018-06-29 LAB — I-STAT BETA HCG BLOOD, ED (MC, WL, AP ONLY): I-stat hCG, quantitative: <5 m[IU]/mL (ref <5)

## 2018-06-29 MED ORDER — ONDANSETRON HCL 4 MG PO TABS: 4.0000 mg | ORAL_TABLET | Freq: Three times a day (TID) | ORAL | Status: DC | PRN

## 2018-06-29 MED ORDER — IBUPROFEN 200 MG PO TABS: 600.0000 mg | ORAL_TABLET | Freq: Three times a day (TID) | ORAL | Status: DC | PRN

## 2018-06-29 MED ORDER — ZOLPIDEM TARTRATE 5 MG PO TABS
5.0000 mg | ORAL_TABLET | Freq: Every evening | ORAL | Status: DC | PRN
  Filled 2018-06-29: qty 1

## 2018-06-29 MED ORDER — ALUM & MAG HYDROXIDE-SIMETH 200-200-20 MG/5ML PO SUSP: 30.0000 mL | Freq: Four times a day (QID) | ORAL | Status: DC | PRN

## 2018-06-29 NOTE — ED Triage Notes (Addendum)
Pt brought in by mother for hallucinations and psychosis x 3 days. Pt been off her medications due to not having Medicaid and couldn't get injection but now has medicaid per mother. Having auditory hallucinations that make her feel like she having "out of body experience".
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
## 2018-06-29 NOTE — ED Notes (Signed)
Pt stated "I quit taking my meds about 6 months ago.  I went down the wrong road and did other things."

## 2018-06-29 NOTE — ED Notes (Signed)
Pt refused ambien stating "I don't want to take [place]."  Questioned what med she wanted for sleep.  Pt is irritable & stated "I'm not a doctor.  Y'all are doctors."  Pt has removed sheet from the bed.  Encouraged pt to put sheet back on bed.

## 2018-06-29 NOTE — ED Notes (Signed)
TTS assessment in progress. 

## 2018-06-29 NOTE — ED Notes (Signed)
Bed: WBH35 Expected date:  Expected time:  Means of arrival:  Comments: Triage 3 

## 2018-06-29 NOTE — ED Notes (Signed)
Bed: [facility]2 Expected date:  Expected time:  Means of arrival:  Comments: 

## 2018-06-29 NOTE — ED Provider Notes (Signed)
[facility]-EMERGENCY DEPT Provider Note   CSN: [id number] Arrival date & time: 06/29/18  1728     History   Chief Complaint Chief Complaint  Patient presents with  . Hallucinations  . off medications    HPI Theresa [name] is a 23 y.o. female.  The history is provided by the patient, a parent and medical records. No language interpreter was used.   Theresa [name] is a 23 y.o. female who presents to the Emergency Department complaining of loose nations and off medications. She has a history of bipolar disorder and has been off of her medications since May of last year. She comes to the emergency department for evaluation of auditory hallucinations, has not slept the last three days. When asked to describe her hallucinations she says sometimes they are near and sometimes they are far. She denies any SI, HI. Her mother states that she has been with the wrong crowd and using meth and finally came home and is agreeable to treatment. She does not have any current prescribed medications. She denies any current medical complaints. Past Medical History:  Diagnosis Date  . Abnormal vaginal Pap smear   . Anxiety   . Bipolar disorder (HCC)   . Depression   . Migraine without aura   . Polycystic ovary syndrome   . Psychosis (HCC)   . Schizophrenia (HCC)   . STD (sexually transmitted disease) 10-14-15   chlamydia     Patient Active Problem List   Diagnosis Date Noted  . Schizoaffective disorder, bipolar type (HCC) 10/17/2017  . UTI (urinary tract infection) 09/28/2017  . 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 Bipolar affective disorder (HCC) 09/20/2017  . Bipolar affective disorder, mixed, severe, with psychotic behavior (HCC) 10/04/2016  . PCOS (polycystic ovarian syndrome) 01/18/2016  . Cannabis use disorder, moderate, dependence (HCC) 12/06/2015  . Tobacco use disorder 12/05/2015    History reviewed. No pertinent surgical history.   OB  History    Gravida  0   Para  0   Term  0   Preterm  0   AB  0   Living  0     SAB  0   TAB  0   Ectopic  0   Multiple  0   Live Births               Home Medications    Prior to Admission medications   Medication Sig Start Date End Date Taking? Authorizing Provider  acetaminophen (TYLENOL) 500 MG tablet Take 500 mg by mouth daily as needed for mild pain or headache.   Yes [provider]  Aspirin-Salicylamide-Caffeine (BC HEADACHE PO) Take 1 packet by mouth daily as needed (headache/pain).   Yes [provider]  ibuprofen (ADVIL,MOTRIN) 200 MG tablet Take 800 mg by mouth 2 (two) times daily as needed (pain).   Yes [provider]  albuterol (PROVENTIL HFA;VENTOLIN HFA) 108 (90 Base) MCG/ACT inhaler Inhale 1-2 puffs into the lungs every 6 (six) hours as needed for wheezing or shortness of breath. Patient not taking: Reported on 01/07/2018 10/23/17   Armandina Stammer I, NP  hydrOXYzine (ATARAX/VISTARIL) 50 MG tablet Take 1 tablet (50 mg total) by mouth every 6 (six) hours as needed for anxiety. Patient not taking: Reported on 06/29/2018 10/23/17   Armandina Stammer I, NP  paliperidone (INVEGA SUSTENNA) 156 MG/ML SUSY injection Inject 1 mL (156 mg total) into the muscle every 28 (twenty-eight) days. (Due on 11-20-17): For mood control  Patient not taking: Reported on 06/29/2018 11/20/17   Armandina StammerNwoko, Agnes I, NP  traZODone (DESYREL) 50 MG tablet Take 1 tablet (50 mg total) by mouth at bedtime as needed for sleep. Patient not taking: Reported on 06/29/2018 10/23/17   Sanjuana KavaNwoko, Agnes I, NP    Family History Family History  Problem Relation Age of Onset  . Hypertension Mother   . Heart disease Mother   . Cancer Mother   . Hypertension Other   . Depression Father   . Drug abuse Father   . Bipolar disorder Father   . Heart disease Maternal Grandmother   . Pancreatitis Maternal Grandfather   . Cancer Paternal Grandmother   . Cancer Paternal Grandfather     Social  History Social History   Tobacco Use  . Smoking status: Current Every Day Smoker    Packs/day: 0.50    Types: Cigarettes  . Smokeless tobacco: Never Used  Substance Use Topics  . Alcohol use: Yes    Alcohol/week: 0.0 standard drinks  . Drug use: Yes    Types: Marijuana    Comment: pt denies     Allergies   Patient has no known allergies.   Review of Systems Review of Systems  All other systems reviewed and are negative.    Physical Exam Updated Vital Signs BP (!) 137/91 (BP Location: Right Arm)   Pulse 85   Temp 97.9 F (36.6 C) (Oral)   Resp 16   Ht 5' 3.5" (1.613 m)   Wt 79.4 kg   SpO2 99%   BMI 30.51 kg/m   Physical Exam Vitals signs and nursing note reviewed.  Constitutional:      Appearance: She is well-developed.  HENT:     Head: Normocephalic and atraumatic.  Cardiovascular:     Rate and Rhythm: Normal rate and regular rhythm.  Pulmonary:     Effort: Pulmonary effort is normal. No respiratory distress.  Musculoskeletal: Normal range of motion.  Skin:    General: Skin is warm.  Neurological:     Mental Status: She is alert and oriented to person, place, and time.  Psychiatric:     Comments: Mildly anxious.  Denies SI, HI      ED Treatments / Results  Labs (all labs ordered are listed, but only abnormal results are displayed) Labs Reviewed  COMPREHENSIVE METABOLIC PANEL - Abnormal; Notable for the following components:      Result Value   Glucose, Bld 111 (*)    Total Protein 8.9 (*)    Albumin 5.4 (*)    All other components within normal limits  CBC WITH DIFFERENTIAL/PLATELET - Abnormal; Notable for the following components:   Hemoglobin 15.5 (*)    HCT 47.0 (*)    Platelets 458 (*)    All other components within normal limits  ETHANOL  RAPID URINE DRUG SCREEN, HOSP PERFORMED  I-STAT BETA HCG BLOOD, ED ([facility], [facility], [facility] ONLY)    EKG None  Radiology No results found.  Procedures Procedures (including critical care  time)  Medications Ordered in ED Medications - No data to display   Initial Impression / Assessment and Plan / ED Course  I have reviewed the triage vital signs and the nursing notes.  Pertinent labs & imaging results that were available during my care of the patient were reviewed by me and considered in my medical decision making (see chart for details).     Patient with history of bipolar disorder here for evaluation of auditory hallucinations, poor  sleep. She is non-toxic appearing on evaluation, no active SI or HI but she does have disorganized thought process and paranoia. She has been medically cleared for psychiatric evaluation and treatment.  Final Clinical Impressions(s) / ED Diagnoses   Final diagnoses:  None    ED Discharge Orders    None       Tilden Fossaees, Sophiagrace Benbrook, MD 06/29/18 2309

## 2018-06-29 NOTE — ED Notes (Signed)
Main lab to come draw labs.

## 2018-06-29 NOTE — ED Notes (Signed)
Pt informed needed to provide urine specimen.  Pt stated "I've been waiting on meds to sleep.  I've been here since 1800 and now they want a urine specimen?"
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
## 2018-06-29 NOTE — ED Notes (Signed)
Pt oriented to room and unit.  Pt is calm and cooperative and somewhat withdrawn.  15 minute checks and video monitoring in place. 

## 2018-06-30 ENCOUNTER — Other Ambulatory Visit: Payer: Self-pay

## 2018-06-30 ENCOUNTER — Inpatient Hospital Stay
Admission: AD | Admit: 2018-06-30 | Discharge: 2018-07-06 | DRG: 885 | Disposition: A | Discharge status: Home / Self Care | Payer: Medicaid Other | Source: Intra-hospital | Attending: Psychiatry | Admitting: Psychiatry

## 2018-06-30 DIAGNOSIS — F17210 Nicotine dependence, cigarettes, uncomplicated: Secondary | ICD-10-CM | POA: Diagnosis present

## 2018-06-30 DIAGNOSIS — F250 Schizoaffective disorder, bipolar type: Secondary | ICD-10-CM

## 2018-06-30 DIAGNOSIS — F17200 Nicotine dependence, unspecified, uncomplicated: Secondary | ICD-10-CM | POA: Diagnosis present

## 2018-06-30 DIAGNOSIS — Z818 Family history of other mental and behavioral disorders: Secondary | ICD-10-CM | POA: Diagnosis not present

## 2018-06-30 DIAGNOSIS — Z79899 Other long term (current) drug therapy: Secondary | ICD-10-CM | POA: Diagnosis not present

## 2018-06-30 DIAGNOSIS — F1510 Other stimulant abuse, uncomplicated: Secondary | ICD-10-CM | POA: Diagnosis present

## 2018-06-30 MED ORDER — ALUM & MAG HYDROXIDE-SIMETH 200-200-20 MG/5ML PO SUSP: 30.0000 mL | Freq: Four times a day (QID) | ORAL | Status: DC | PRN

## 2018-06-30 MED ORDER — HYDROXYZINE HCL 25 MG PO TABS: 25.0000 mg | ORAL_TABLET | Freq: Four times a day (QID) | ORAL | Status: DC | PRN

## 2018-06-30 MED ORDER — ALBUTEROL SULFATE HFA 108 (90 BASE) MCG/ACT IN AERS
1.0000 | INHALATION_SPRAY | Freq: Four times a day (QID) | RESPIRATORY_TRACT | Status: DC | PRN
  Filled 2018-06-30: qty 6.7

## 2018-06-30 MED ORDER — IBUPROFEN 600 MG PO TABS: 600.0000 mg | ORAL_TABLET | Freq: Three times a day (TID) | ORAL | Status: DC | PRN

## 2018-06-30 MED ORDER — PALIPERIDONE ER 6 MG PO TB24
6.0000 mg | ORAL_TABLET | Freq: Every day | ORAL | Status: DC
  Filled 2018-06-30: qty 1

## 2018-06-30 MED ORDER — HYDROXYZINE HCL 50 MG PO TABS
50.0000 mg | ORAL_TABLET | Freq: Four times a day (QID) | ORAL | Status: DC | PRN
  Administered 2018-07-03: 50 mg via ORAL
  Filled 2018-06-30: qty 1

## 2018-06-30 MED ORDER — OLANZAPINE 5 MG PO TBDP
20.0000 mg | ORAL_TABLET | Freq: Every day | ORAL | Status: DC
  Administered 2018-06-30: 20 mg via ORAL
  Filled 2018-06-30: qty 4

## 2018-06-30 MED ORDER — NICOTINE 14 MG/24HR TD PT24
14.0000 mg | MEDICATED_PATCH | Freq: Every day | TRANSDERMAL | Status: DC
  Filled 2018-06-30 (×5): qty 1

## 2018-06-30 MED ORDER — MAGNESIUM HYDROXIDE 400 MG/5ML PO SUSP: 30.0000 mL | Freq: Every day | ORAL | Status: DC | PRN

## 2018-06-30 MED ORDER — HYDROXYZINE HCL 25 MG PO TABS: 25.0000 mg | ORAL_TABLET | Freq: Three times a day (TID) | ORAL | Status: DC | PRN

## 2018-06-30 MED ORDER — ALUM & MAG HYDROXIDE-SIMETH 200-200-20 MG/5ML PO SUSP: 30.0000 mL | ORAL | Status: DC | PRN

## 2018-06-30 MED ORDER — ACETAMINOPHEN 325 MG PO TABS: 650.0000 mg | ORAL_TABLET | Freq: Four times a day (QID) | ORAL | Status: DC | PRN

## 2018-06-30 MED ORDER — TRAZODONE HCL 100 MG PO TABS
100.0000 mg | ORAL_TABLET | Freq: Every day | ORAL | Status: DC
  Administered 2018-06-30 – 2018-07-05 (×5): 100 mg via ORAL
  Filled 2018-06-30 (×5): qty 1

## 2018-06-30 MED ORDER — ONDANSETRON HCL 4 MG PO TABS
4.0000 mg | ORAL_TABLET | Freq: Three times a day (TID) | ORAL | Status: DC | PRN
  Filled 2018-06-30: qty 1

## 2018-06-30 MED ORDER — PALIPERIDONE PALMITATE ER 156 MG/ML IM SUSY
156.0000 mg | PREFILLED_SYRINGE | Freq: Once | INTRAMUSCULAR | Status: AC
  Administered 2018-06-30: 156 mg via INTRAMUSCULAR
  Filled 2018-06-30: qty 1

## 2018-06-30 NOTE — ED Provider Notes (Signed)
Transferred to [facility] to Dr. Dayton Scrape.   Virgina Norfolk, DO 06/30/18 1844

## 2018-06-30 NOTE — ED Notes (Signed)
Pt up and walking in dept.

## 2018-06-30 NOTE — ED Notes (Signed)
Pt refusing to take medications  

## 2018-06-30 NOTE — BH Assessment (Signed)
[facility] Assessment Progress Note  Per Juanetta Beets, DO, this pt requires psychiatric hospitalization.  Wilmon Arms, Counselor reports that pt has been accepted to [facility] by Dr Viviano Simas to the service of Dr Jennet Maduro, Rm 312.  Dr Sharma Covert also finds that pt meets criteria for IVC, which she has initiated.  IVC documents have been faxed to [facility], and at 12:44 Hortense Ramal confirms receipt.  He has since faxed Findings and Custody Order to this Clinical research associate.  At 13:00 I called [employer] and spoke to [employer], who took demographic information, agreeing to dispatch law enforcement to fill out Return of Service.  Law enforcement then presented at [facility], completing Return of Service, after which IVC documents were faxed to [phone].  Pt's nurse, Angelique Blonder, has been notified, and agrees to call report to [phone].  Pt is to be transported via [facility].   Doylene Canning, [place] [facility] Coordinator [phone]

## 2018-06-30 NOTE — ED Notes (Signed)
Pt refused VS  

## 2018-06-30 NOTE — ED Notes (Signed)
Patient denies pain and is resting comfortably.  

## 2018-06-30 NOTE — Tx Team (Signed)
Initial Treatment Plan 06/30/2018 8:35 PM Abijah Riedel [facility]:[id number]    PATIENT STRESSORS: Financial difficulties Medication change or noncompliance Substance abuse   PATIENT STRENGTHS: Capable of independent living Communication skills General fund of knowledge Motivation for treatment/growth   PATIENT IDENTIFIED PROBLEMS: Suicide Ideations    Drug miss Use    Sadness,Depression/ Anxiety    Financial difficulties.         DISCHARGE CRITERIA:  Adequate post-discharge living arrangements Motivation to continue treatment in a less acute level of care Reduction of life-threatening or endangering symptoms to within safe limits Safe-care adequate arrangements made  PRELIMINARY DISCHARGE PLAN: Attend 12-step recovery group Outpatient therapy Participate in family therapy Return to previous living arrangement  PATIENT/FAMILY INVOLVEMENT: This treatment plan has been presented to and reviewed with the patient, Theresa [name],   The patient  have been given the opportunity to ask questions and make suggestions.  Lelan Pons, RN 06/30/2018, 8:35 PM

## 2018-06-30 NOTE — BH Assessment (Signed)
Patient has been accepted to [facility].  Accepting physician is Dr. Viviano Simas.  Attending Physician will be Dr. Jennet Maduro.  Patient has been assigned to room 312, by [facility] Charge Nurse [place].  Call report to [phone].  Representative/Transfer Coordinator is Mirian Mo ([facility] TTS) Patient pre-admitted by [facility] Patient Access Ethelene Browns)   [facility] ER Staff Scharlene Corn, TTS) made aware of acceptance.

## 2018-06-30 NOTE — Progress Notes (Signed)
Patient is a new admit who is known to [place] from previous admissions, patient present in a sad mood and flat affect, and not speaking much, patient stated she need to sleep and she is feeling sleepy, patient verbally  contract for safety, unit guidelines and expected behaviors was discussed with patient, cold beverages and drinks was offered and received, room and unit orientation is complete, skin and body search  Is done, by Janice/Alex, skin and is clean and no contraband found on patient. Patient denies any SI/HI, but endorsing auditory hallucinations , voice telling her to hurt herself, but with no plan., patient also endorse using drugs specifically methamphetamine, patient had history of bipolar disorder and psychosis. Safety is discussed, remind patient of 15 minute safety rounding for safety , no respiratory distress. Noted.

## 2018-06-30 NOTE — ED Notes (Signed)
Dr. Erma Heritage informed of pt's request for something else to sleep.  Per Dr. Erma Heritage, will order vistaril.  Pt informed of EDP's response.  Pt stated "I don't like to take drugs that I don't know what they do.  I've taken trazodone a lot of it and it did nothing.  Are you a Christian?"  Informed pt was not a subject for discussion at which time the pt then stated "you get out of my room in the name of Jesus Christ, get out of my room."

## 2018-06-30 NOTE — ED Notes (Signed)
Pt refuses EKG.
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
## 2018-06-30 NOTE — ED Notes (Signed)
Report given to Kristen

## 2018-06-30 NOTE — ED Notes (Addendum)
Pt refused EKG. Nurse Notified.

## 2018-06-30 NOTE — ED Notes (Signed)
[facility] would like for next to call and give report which is after 7pm. Room 312. Please call [phone] to give report.

## 2018-06-30 NOTE — ED Notes (Signed)
Pt refused vital signs. RN notified.  

## 2018-07-01 DIAGNOSIS — F250 Schizoaffective disorder, bipolar type: Principal | ICD-10-CM

## 2018-07-01 LAB — LIPID PANEL
Cholesterol: 147 mg/dL (ref 0–200)
HDL: 45 mg/dL (ref >40)
LDL Cholesterol: 91 mg/dL (ref 0–99)
Total CHOL/HDL Ratio: 3.3 RATIO
Triglycerides: 54 mg/dL (ref <150)
VLDL: 11 mg/dL (ref 0–40)

## 2018-07-01 LAB — HEMOGLOBIN A1C
Hgb A1c MFr Bld: 5.2 % (ref 4.8–5.6)
Mean Plasma Glucose: 102.54 mg/dL

## 2018-07-01 MED ORDER — PALIPERIDONE PALMITATE ER 156 MG/ML IM SUSY
156.0000 mg | PREFILLED_SYRINGE | Freq: Once | INTRAMUSCULAR | Status: AC
  Administered 2018-07-05: 156 mg via INTRAMUSCULAR
  Filled 2018-07-01: qty 1

## 2018-07-01 MED ORDER — PALIPERIDONE PALMITATE ER 234 MG/1.5ML IM SUSY
234.0000 mg | PREFILLED_SYRINGE | INTRAMUSCULAR | Status: DC
  Administered 2018-07-01: 234 mg via INTRAMUSCULAR
  Filled 2018-07-01: qty 1.5

## 2018-07-01 MED ORDER — ARIPIPRAZOLE ER 400 MG IM SRER
400.0000 mg | INTRAMUSCULAR | Status: DC
  Filled 2018-07-01: qty 2
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
## 2018-07-01 NOTE — Progress Notes (Signed)
Recreation Therapy Notes  Date: 07/01/2018  Time: 9:30 am   Location: Craft room   Behavioral response: N/A   Intervention Topic: Self- care  Discussion/Intervention: Patient did not attend group.   Clinical Observations/Feedback:  Patient did not attend group.   Vylette Strubel LRT/CTRS        Derrico Zhong 07/01/2018 11:49 AM 

## 2018-07-01 NOTE — Progress Notes (Signed)
Recreation Therapy Notes  INPATIENT RECREATION THERAPY ASSESSMENT  Patient Details Name: Theresa [name] MRN: [id number] DOB: 12-05-1995 Today's Date: 07/01/2018       Information Obtained From: Patient(Patient did not respond to any questions verbally.)  Able to Participate in Assessment/Interview:    Patient Presentation:    Reason for Admission (Per Patient):    Patient Stressors:    Coping Skills:      Leisure Interests (2+):     Frequency of Recreation/Participation:    Awareness of Community Resources:     [employer]:     Current Use:    If no, Barriers?:    Expressed Interest in [employer] Information:    [place] of Residence:     Patient Main Form of Transportation:    Patient Strengths:     Patient Identified Areas of Improvement:     Patient Goal for Hospitalization:     Current SI (including self-harm):     Current HI:     Current AVH:    Staff Intervention Plan:    Consent to Intern Participation:    Shila Kruczek 07/01/2018, 3:44 PM

## 2018-07-01 NOTE — [facility] Group Notes (Signed)
LCSW Group Therapy Note  07/01/2018 2:10 PM  Type of Therapy/Topic:  Group Therapy:  Emotion Regulation  Participation Level:  Did Not Attend. Pt invited, chose not to attend.   Description of Group:   The purpose of this group is to assist patients in learning to regulate negative emotions and experience positive emotions. Patients will be guided to discuss ways in which they have been vulnerable to their negative emotions. These vulnerabilities will be juxtaposed with experiences of positive emotions or situations, and patients will be challenged to use positive emotions to combat negative ones. Special emphasis will be placed on coping with negative emotions in conflict situations, and patients will process healthy conflict resolution skills.  Therapeutic Goals: 1. Patient will identify two positive emotions or experiences to reflect on in order to balance out negative emotions 2. Patient will label two or more emotions that they find the most difficult to experience 3. Patient will demonstrate positive conflict resolution skills through discussion and/or role plays  Summary of Patient Progress: x   Therapeutic Modalities:   Cognitive Behavioral Therapy Feelings Identification Dialectical Behavioral Therapy   Naeemah Jasmer, MSW, LCSW Clinical Social Work 07/01/2018 2:10 PM    

## 2018-07-01 NOTE — [facility] Group Notes (Signed)
[facility] Group Notes:  (Nursing/MHT/Case Management/Adjunct)  Date:  07/01/2018  Time:  2:56 PM  Type of Therapy:  Psychoeducational Skills  Participation Level:  Did Not Attend  Theresa [name] 07/01/2018, 2:56 PM

## 2018-07-01 NOTE — [facility] Suicide Risk Assessment (Signed)
[facility] INPATIENT:  Family/Significant Other Suicide Prevention Education  Suicide Prevention Education:  Patient Refusal for Family/Significant Other Suicide Prevention Education: The patient Theresa [name] has refused to provide written consent for family/significant other to be provided Family/Significant Other Suicide Prevention Education during admission and/or prior to discharge.  Physician notified.  Chiyo Fay T Ileene Allie 07/01/2018, 10:14 AM

## 2018-07-01 NOTE — [facility] Suicide Risk Assessment (Signed)
[facility] Admission Suicide Risk Assessment   Nursing information obtained from:  Patient Demographic factors:  Unemployed Current Mental Status:  NA Loss Factors:  Financial problems / change in socioeconomic status Historical Factors:  Impulsivity Risk Reduction Factors:  Positive therapeutic relationship  Total Time spent with patient: 1 hour Principal Problem: Schizoaffective disorder, bipolar type (HCC) Diagnosis:  Principal Problem:   Schizoaffective disorder, bipolar type (HCC) Active Problems:   Tobacco use disorder  Subjective Data: psychotic break  Continued Clinical Symptoms:  Alcohol Use Disorder Identification Test Final Score (AUDIT): 6 The "Alcohol Use Disorders Identification Test", Guidelines for Use in Primary Care, Second Edition.  World Science writer [facility]). Score between 0-7:  no or low risk or alcohol related problems. Score between 8-15:  moderate risk of alcohol related problems. Score between 16-19:  high risk of alcohol related problems. Score 20 or above:  warrants further diagnostic evaluation for alcohol dependence and treatment.   CLINICAL FACTORS:   Schizophrenia:   Depressive state Less than 63 years old Paranoid or undifferentiated type   Musculoskeletal: Strength & Muscle Tone: within normal limits Gait & Station: normal Patient leans: N/A  Psychiatric Specialty Exam: Physical Exam  Nursing note and vitals reviewed. Psychiatric: Her mood appears anxious. Her affect is labile and inappropriate. Her speech is delayed. She is slowed and withdrawn. Thought content is paranoid and delusional. Cognition and memory are impaired. She expresses impulsivity. She is noncommunicative.    Review of Systems  Neurological: Negative.   Psychiatric/Behavioral: Negative.   All other systems reviewed and are negative.   Blood pressure (!) 115/53, pulse (!) 54, temperature 98.2 F (36.8 C), temperature source Oral, resp. rate 18, height 5\' 4"  (1.626 m),  weight 79.4 kg, SpO2 99 %.Body mass index is 30.04 kg/m.  General Appearance: Fairly Groomed  Eye Contact:  Fair  Speech:  Blocked  Volume:  Decreased  Mood:  Anxious and Depressed  Affect:  Tearful  Thought Process:  Irrelevant  Orientation:  Other:  person and place only  Thought Content:  Delusions, Hallucinations: Auditory and Paranoid Ideation  Suicidal Thoughts:  No  Homicidal Thoughts:  No  Memory:  Immediate;   Poor Recent;   Poor Remote;   Poor  Judgement:  Poor  Insight:  Lacking  Psychomotor Activity:  Psychomotor Retardation  Concentration:  Concentration: Poor and Attention Span: Poor  Recall:  Poor  Fund of Knowledge:  Fair  Language:  Poor  Akathisia:  No  Handed:  Right  AIMS (if indicated):     Assets:  Communication Skills Desire for Improvement Financial Resources/Insurance Housing Physical Health Resilience Social Support  ADL's:  Intact  Cognition:  WNL  Sleep:  Number of Hours: 7      COGNITIVE FEATURES THAT CONTRIBUTE TO RISK:  None    SUICIDE RISK:   Moderate:  Frequent suicidal ideation with limited intensity, and duration, some specificity in terms of plans, no associated intent, good self-control, limited dysphoria/symptomatology, some risk factors present, and identifiable protective factors, including available and accessible social support.  PLAN OF CARE: hospital admission, medication management, substance abuse counseling, discharge planning.  Theresa [name] is a 23 year old female with a history od schizophrenia admitted floridly psychotic in the context of medication noncomplinace and substance abuse.  #Psychosis -continue Zyprexa 20 mg nightly for two weeks -start Abilify maintena 400 mg monthly injections -Trazodone 100 mg nightly  #Substance abuse -abusing amphetamines -patient not ready to address problems  #Smoking cessation -nicotine patch available  #Labs -lipid  panel, TSH, A1C -EKG -pregnancy  test  #Disposition -discharge to home with family -follow up with [facility] in   I certify that inpatient services furnished can reasonably be expected to improve the patient's condition.   Kristine Linea, MD 07/01/2018, 3:50 PM

## 2018-07-01 NOTE — H&P (Addendum)
Psychiatric Admission Assessment Adult  Patient Identification: Theresa [name] MRN:  [id number] Date of Evaluation:  07/01/2018 Chief Complaint:  schizoaffective disorder Principal Diagnosis: Schizoaffective disorder, bipolar type (HCC) Diagnosis:  Principal Problem:   Schizoaffective disorder, bipolar type (HCC) Active Problems:   Tobacco use disorder  History of Present Illness:   Identifying data. [name] is a 23 year old female with a history of schizoaffective disorder.  Chief complaint. Unable to state. Cries.  History of present illness. Information was obtained from the patient and the chart. The patient was brought to [facility] ER for a psychotic break. She has been off medications since last spring and eventually succumbed back into psychosis. Per mother, for the past few weeks tha patient left the house and was ondrug binge, including amphetamines. When returned home, she "was ready" for treatment. The patient herself contributes very little. She is crying quietly all through the interview and treatment team meeting. She does not want to take medications because they make her "stiff". Unable to chose betwin Abilify maintena and Invega sustenna shots. She usually does well on Zyprexa but compliance is a problem.  Patient herself denies any symptoms of depression, anxiety or psychosis. She is unable to discuss drug use.  Past psychiatric history. Long history of schizoaffective disorder, since teenage years with multiple hospitalizations, medication trials and noncomplinace. No suicide attempts. There is a history of cannabis use.  Family psychiatric history. Grandfather with psychosis.  Social history. Has disability and Medicaid now. Lives with her mother.   Total Time spent with patient: 1 hour  Is the patient at risk to self? No.  Has the patient been a risk to self in the past 6 months? No.  Has the patient been a risk to self within the distant past? No.   Is the patient a risk to others? No.  Has the patient been a risk to others in the past 6 months? No.  Has the patient been a risk to others within the distant past? No.   Prior Inpatient Therapy:   Prior Outpatient Therapy:    Alcohol Screening: 1. How often do you have a drink containing alcohol?: Monthly or less 2. How many drinks containing alcohol do you have on a typical day when you are drinking?: 3 or 4 3. How often do you have six or more drinks on one occasion?: Never AUDIT-C Score: 2 5. How often during the last year have you failed to do what was normally expected from you becasue of drinking?: Less than monthly 6. How often during the last year have you needed a first drink in the morning to get yourself going after a heavy drinking session?: Less than monthly 7. How often during the last year have you had a feeling of guilt of remorse after drinking?: Less than monthly 8. How often during the last year have you been unable to remember what happened the night before because you had been drinking?: Less than monthly 9. Have you or someone else been injured as a result of your drinking?: No 10. Has a relative or friend or a doctor or another health worker been concerned about your drinking or suggested you cut down?: No Alcohol Use Disorder Identification Test Final Score (AUDIT): 6 Alcohol Brief Interventions/Follow-up: Alcohol Education Substance Abuse History in the last 12 months:  Yes.   Consequences of Substance Abuse: Negative Previous Psychotropic Medications: Yes  Psychological Evaluations: No  Past Medical History:  Past Medical History:  Diagnosis Date  .  Abnormal vaginal Pap smear   . Anxiety   . Bipolar disorder (HCC)   . Depression   . Migraine without aura   . Polycystic ovary syndrome   . Psychosis (HCC)   . Schizophrenia (HCC)   . STD (sexually transmitted disease) 10-14-15   chlamydia    History reviewed. No pertinent surgical history. Family  History:  Family History  Problem Relation Age of Onset  . Hypertension Mother   . Heart disease Mother   . Cancer Mother   . Hypertension Other   . Depression Father   . Drug abuse Father   . Bipolar disorder Father   . Heart disease Maternal Grandmother   . Pancreatitis Maternal Grandfather   . Cancer Paternal Grandmother   . Cancer Paternal Grandfather    Tobacco Screening: Have you used any form of tobacco in the last 30 days? (Cigarettes, Smokeless Tobacco, Cigars, and/or Pipes): No Social History:  Social History   Substance and Sexual Activity  Alcohol Use Yes  . Alcohol/week: 0.0 standard drinks     Social History   Substance and Sexual Activity  Drug Use Yes  . Types: Marijuana   Comment: pt denies    Additional Social History:                           Allergies:  No Known Allergies Lab Results:  Results for orders placed or performed during the hospital encounter of 06/29/18 (from the past 48 hour(s))  Comprehensive metabolic panel     Status: Abnormal   Collection Time: 06/29/18  7:26 PM  Result Value Ref Range   Sodium 139 135 - 145 mmol/L   Potassium 3.5 3.5 - 5.1 mmol/L   Chloride 105 98 - 111 mmol/L   CO2 22 22 - 32 mmol/L   Glucose, Bld 111 (H) 70 - 99 mg/dL   BUN 11 6 - 20 mg/dL   Creatinine, Ser 1.610.76 0.44 - 1.00 mg/dL   Calcium 9.9 8.9 - 09.610.3 mg/dL   Total Protein 8.9 (H) 6.5 - 8.1 g/dL   Albumin 5.4 (H) 3.5 - 5.0 g/dL   AST 18 15 - 41 U/L   ALT 16 0 - 44 U/L   Alkaline Phosphatase 87 38 - 126 U/L   Total Bilirubin 0.7 0.3 - 1.2 mg/dL   GFR calc non Af Amer >60 >60 mL/min   GFR calc Af Amer >60 >60 mL/min   Anion gap 12 5 - 15    Comment: Performed at [facility], [address] [place], [place] [address]  Ethanol     Status: None   Collection Time: 06/29/18  7:26 PM  Result Value Ref Range   Alcohol, Ethyl (B) <10 <10 mg/dL    Comment: (NOTE) Lowest detectable limit for serum alcohol is 10 mg/dL. For  medical purposes only. Performed at [facility], [address] [place], [place] [address]   CBC with Differential     Status: Abnormal   Collection Time: 06/29/18  7:26 PM  Result Value Ref Range   WBC 9.8 4.0 - 10.5 K/uL   RBC 5.10 3.87 - 5.11 MIL/uL   Hemoglobin 15.5 (H) 12.0 - 15.0 g/dL   HCT 14.747.0 (H) 82.936.0 - 56.246.0 %   MCV 92.2 80.0 - 100.0 fL   MCH 30.4 26.0 - 34.0 pg   MCHC 33.0 30.0 - 36.0 g/dL   RDW 13.012.0 86.511.5 - 78.415.5 %  Platelets 458 (H) 150 - 400 K/uL   nRBC 0.0 0.0 - 0.2 %   Neutrophils Relative % 66 %   Neutro Abs 6.5 1.7 - 7.7 K/uL   Lymphocytes Relative 27 %   Lymphs Abs 2.7 0.7 - 4.0 K/uL   Monocytes Relative 6 %   Monocytes Absolute 0.6 0.1 - 1.0 K/uL   Eosinophils Relative 0 %   Eosinophils Absolute 0.0 0.0 - 0.5 K/uL   Basophils Relative 1 %   Basophils Absolute 0.1 0.0 - 0.1 K/uL   Immature Granulocytes 0 %   Abs Immature Granulocytes 0.02 0.00 - 0.07 K/uL    Comment: Performed at [facility], [address], [place], [place] [address]  I-Stat beta hCG blood, ED     Status: None   Collection Time: 06/29/18  7:47 PM  Result Value Ref Range   I-stat hCG, quantitative <5.0 <5 mIU/mL   Comment 3            Comment:   GEST. AGE      CONC.  (mIU/mL)   <=1 WEEK        5 - 50     2 WEEKS       50 - 500     3 WEEKS       100 - 10,000     4 WEEKS     1,000 - 30,000        FEMALE AND NON-PREGNANT FEMALE:     LESS THAN 5 mIU/mL     Blood Alcohol level:  Lab Results  Component Value Date   ETH <10 06/29/2018   ETH <10 05/13/2018    Metabolic Disorder Labs:  Lab Results  Component Value Date   HGBA1C 5.1 10/19/2017   MPG 99.67 10/19/2017   MPG 102.54 09/25/2017   Lab Results  Component Value Date   PROLACTIN 73.5 (H) 10/19/2017   PROLACTIN 4.0 10/14/2015   Lab Results  Component Value Date   CHOL 139 10/19/2017   TRIG 52 10/19/2017   HDL 43 10/19/2017   CHOLHDL 3.2 10/19/2017   VLDL 10 10/19/2017    LDLCALC 86 10/19/2017   LDLCALC 52 09/25/2017    Current Medications: Current Facility-Administered Medications  Medication Dose Route Frequency Provider Last Rate Last Dose  . acetaminophen (TYLENOL) tablet 650 mg  650 mg Oral Q6H PRN Thomspon, Adela Lank, NP      . albuterol (PROVENTIL HFA;VENTOLIN HFA) 108 (90 Base) MCG/ACT inhaler 1-2 puff  1-2 puff Inhalation Q6H PRN Thomspon, Adela Lank, NP      . alum & mag hydroxide-simeth (MAALOX/MYLANTA) 200-200-20 MG/5ML suspension 30 mL  30 mL Oral Q6H PRN Thomspon, Adela Lank, NP      . ARIPiprazole ER (ABILIFY MAINTENA) injection 400 mg  400 mg Intramuscular Q28 days Kirra Verga B, MD      . hydrOXYzine (ATARAX/VISTARIL) tablet 50 mg  50 mg Oral Q6H PRN Thomspon, Adela Lank, NP      . ibuprofen (ADVIL,MOTRIN) tablet 600 mg  600 mg Oral Q8H PRN Thomspon, Adela Lank, NP      . magnesium hydroxide (MILK OF MAGNESIA) suspension 30 mL  30 mL Oral Daily PRN Thomspon, Adela Lank, NP      . nicotine (NICODERM CQ - dosed in mg/24 hours) patch 14 mg  14 mg Transdermal Daily Numair Masden B, MD      . OLANZapine zydis (ZYPREXA) disintegrating tablet 20 mg  20 mg Oral QHS Jacque Garrels B, MD   20 mg at  06/30/18 2127  . ondansetron (ZOFRAN) tablet 4 mg  4 mg Oral Q8H PRN Catalina Gravelhomspon, Jacqueline, NP      . traZODone (DESYREL) tablet 100 mg  100 mg Oral QHS Meril Dray B, MD   100 mg at 06/30/18 2127   PTA Medications: Medications Prior to Admission  Medication Sig Dispense Refill Last Dose  . acetaminophen (TYLENOL) 500 MG tablet Take 500 mg by mouth daily as needed for mild pain or headache.   Past Month at Unknown time  . albuterol (PROVENTIL HFA;VENTOLIN HFA) 108 (90 Base) MCG/ACT inhaler Inhale 1-2 puffs into the lungs every 6 (six) hours as needed for wheezing or shortness of breath. (Patient not taking: Reported on 01/07/2018) 1 Inhaler 0 Not Taking at Unknown time  . Aspirin-Salicylamide-Caffeine (BC HEADACHE PO) Take 1 packet  by mouth daily as needed (headache/pain).   Past Week at Unknown time  . hydrOXYzine (ATARAX/VISTARIL) 50 MG tablet Take 1 tablet (50 mg total) by mouth every 6 (six) hours as needed for anxiety. (Patient not taking: Reported on 06/29/2018) 60 tablet 0 Not Taking at Unknown time  . ibuprofen (ADVIL,MOTRIN) 200 MG tablet Take 800 mg by mouth 2 (two) times daily as needed (pain).   Past Week at Unknown time  . paliperidone (INVEGA SUSTENNA) 156 MG/ML SUSY injection Inject 1 mL (156 mg total) into the muscle every 28 (twenty-eight) days. (Due on 11-20-17): For mood control (Patient not taking: Reported on 06/29/2018) 1.2 mL 0 Not Taking at Unknown time  . traZODone (DESYREL) 50 MG tablet Take 1 tablet (50 mg total) by mouth at bedtime as needed for sleep. (Patient not taking: Reported on 06/29/2018) 30 tablet 0 Not Taking at Unknown time    Musculoskeletal: Strength & Muscle Tone: within normal limits Gait & Station: normal Patient leans: N/A  Psychiatric Specialty Exam: Physical Exam  Nursing note and vitals reviewed. Constitutional: She is oriented to person, place, and time. She appears well-developed and well-nourished.  HENT:  Head: Normocephalic and atraumatic.  Eyes: Pupils are equal, round, and reactive to light. Conjunctivae and EOM are normal.  Neck: Normal range of motion. Neck supple.  Cardiovascular: Normal rate, regular rhythm and normal heart sounds.  Respiratory: Effort normal and breath sounds normal.  GI: Soft. Bowel sounds are normal.  Musculoskeletal: Normal range of motion.  Neurological: She is alert and oriented to person, place, and time.  Skin: Skin is warm.  Psychiatric: Her mood appears anxious. Her affect is blunt and inappropriate. Her speech is delayed. She is slowed, withdrawn and actively hallucinating. Thought content is delusional. Cognition and memory are impaired. She expresses impulsivity. She exhibits a depressed mood. She is noncommunicative.    Review of  Systems  Neurological: Negative.   Psychiatric/Behavioral: Negative.   All other systems reviewed and are negative.   Blood pressure (!) 115/53, pulse (!) 54, temperature 98.2 F (36.8 C), temperature source Oral, resp. rate 18, height 5\' 4"  (1.626 m), weight 79.4 kg, SpO2 99 %.Body mass index is 30.04 kg/m.  See SRA                                                  Sleep:  Number of Hours: 7    Treatment Plan Summary: Daily contact with patient to assess and evaluate symptoms and progress in treatment and Medication management   Ms.  Christakos is a 23 year old female with a history od schizophrenia admitted floridly psychotic in the context of medication noncomplinace and substance abuse.  #Psychosis -continue Zyprexa 20 mg nightly for two weeks -start Abilify maintena 400 mg monthly injections -Trazodone 100 mg nightly  #Substance abuse -abusing amphetamines -patient not ready to address problems  #Smoking cessation -nicotine patch available  #Labs -lipid panel, TSH, A1C -EKG -pregnancy test is negative  #Disposition -discharge to home with family -follow up with [facility] in    Observation Level/Precautions:  15 minute checks  Laboratory:  CBC Chemistry Profile UDS UA  Psychotherapy:    Medications:    Consultations:    Discharge Concerns:    Estimated LOS:  Other:     Physician Treatment Plan for Primary Diagnosis: Schizoaffective disorder, bipolar type (HCC) Long Term Goal(s): Improvement in symptoms so as ready for discharge  Short Term Goals: Ability to identify changes in lifestyle to reduce recurrence of condition will improve, Ability to verbalize feelings will improve, Ability to disclose and discuss suicidal ideas, Ability to demonstrate self-control will improve, Ability to identify and develop effective coping behaviors will improve, Ability to maintain clinical measurements within normal limits will improve, Compliance  with prescribed medications will improve and Ability to identify triggers associated with substance abuse/mental health issues will improve  Physician Treatment Plan for Secondary Diagnosis: Principal Problem:   Schizoaffective disorder, bipolar type (HCC) Active Problems:   Tobacco use disorder  Long Term Goal(s): Improvement in symptoms so as ready for discharge  Short Term Goals: Ability to identify changes in lifestyle to reduce recurrence of condition will improve, Ability to demonstrate self-control will improve and Ability to identify triggers associated with substance abuse/mental health issues will improve  I certify that inpatient services furnished can reasonably be expected to improve the patient's condition.    Kristine Linea, MD 2/5/20204:01 PM

## 2018-07-01 NOTE — Plan of Care (Signed)
  Problem: Education: Goal: Ability to make informed decisions regarding treatment will improve Outcome: Not Progressing   Problem: Coping: Goal: Coping ability will improve Outcome: Not Progressing   Problem: Activity: Goal: Interest or engagement in leisure activities will improve Outcome: Not Progressing Goal: Imbalance in normal sleep/wake cycle will improve Outcome: Not Progressing   Problem: Coping: Goal: Coping ability will improve Outcome: Not Progressing Goal: Will verbalize feelings Outcome: Not Progressing   Problem: Self-Concept: Goal: Level of anxiety will decrease Outcome: Not Progressing  D: Patient was observed lying in bed this morning.  She denies any thoughts of self harm.  She denies any hallucinations; however, she does appear preoccupied.  She was also observed attempted to pull on an outside door, as if attempting to leave.  She asked to use the telephone and was informed that they are on at 1200.  She appears flat, blunted with irritable mood.  She has not attended any groups today.  A: Continue to monitor medication management and MD orders.  Safety checks completed every 15 minutes per protocol.  Offer support and encouragement as needed.  R: Patient is receptive to staff; his/her behavior is appropriate.

## 2018-07-01 NOTE — [facility] Group Notes (Signed)
[facility] Group Notes:  (Nursing/MHT/Case Management/Adjunct)  Date:  07/01/2018  Time:  11:05 PM  Type of Therapy:  Group Therapy  Participation Level:  Did Not Attend   Mayra Neer 07/01/2018, 11:05 PM

## 2018-07-01 NOTE — Tx Team (Addendum)
Interdisciplinary Treatment and Diagnostic Plan Update  07/01/2018 Time of Session: 1030am Theresa [name] MRN: [id number]  Principal Diagnosis: Schizoaffective disorder, bipolar type [facility])  Secondary Diagnoses: Principal Problem:   Schizoaffective disorder, bipolar type ([place]) Active Problems:   Tobacco use disorder   Current Medications:  Current Facility-Administered Medications  Medication Dose Route Frequency Provider Last Rate Last Dose  . acetaminophen (TYLENOL) tablet 650 mg  650 mg Oral Q6H PRN Thomspon, Geni Bers, NP      . albuterol (PROVENTIL HFA;VENTOLIN HFA) 108 (90 Base) MCG/ACT inhaler 1-2 puff  1-2 puff Inhalation Q6H PRN Lamont Dowdy, NP      . alum & mag hydroxide-simeth (MAALOX/MYLANTA) 200-200-20 MG/5ML suspension 30 mL  30 mL Oral Q6H PRN Lamont Dowdy, NP      . hydrOXYzine (ATARAX/VISTARIL) tablet 25 mg  25 mg Oral TID PRN Lamont Dowdy, NP      . hydrOXYzine (ATARAX/VISTARIL) tablet 25 mg  25 mg Oral Q6H PRN Lamont Dowdy, NP      . hydrOXYzine (ATARAX/VISTARIL) tablet 50 mg  50 mg Oral Q6H PRN Lamont Dowdy, NP      . ibuprofen (ADVIL,MOTRIN) tablet 600 mg  600 mg Oral Q8H PRN Thomspon, Geni Bers, NP      . magnesium hydroxide (MILK OF MAGNESIA) suspension 30 mL  30 mL Oral Daily PRN Thomspon, Geni Bers, NP      . nicotine (NICODERM CQ - dosed in mg/24 hours) patch 14 mg  14 mg Transdermal Daily Pucilowska, Jolanta B, MD      . OLANZapine zydis (ZYPREXA) disintegrating tablet 20 mg  20 mg Oral QHS Pucilowska, Jolanta B, MD   20 mg at 06/30/18 2127  . ondansetron (ZOFRAN) tablet 4 mg  4 mg Oral Q8H PRN Lamont Dowdy, NP      . traZODone (DESYREL) tablet 100 mg  100 mg Oral QHS Pucilowska, Jolanta B, MD   100 mg at 06/30/18 2127   PTA Medications: Medications Prior to Admission  Medication Sig Dispense Refill Last Dose  . acetaminophen (TYLENOL) 500 MG tablet Take 500 mg by mouth daily as needed for mild pain or  headache.   Past Month at Unknown time  . albuterol (PROVENTIL HFA;VENTOLIN HFA) 108 (90 Base) MCG/ACT inhaler Inhale 1-2 puffs into the lungs every 6 (six) hours as needed for wheezing or shortness of breath. (Patient not taking: Reported on 01/07/2018) 1 Inhaler 0 Not Taking at Unknown time  . Aspirin-Salicylamide-Caffeine (BC HEADACHE PO) Take 1 packet by mouth daily as needed (headache/pain).   Past Week at Unknown time  . hydrOXYzine (ATARAX/VISTARIL) 50 MG tablet Take 1 tablet (50 mg total) by mouth every 6 (six) hours as needed for anxiety. (Patient not taking: Reported on 06/29/2018) 60 tablet 0 Not Taking at Unknown time  . ibuprofen (ADVIL,MOTRIN) 200 MG tablet Take 800 mg by mouth 2 (two) times daily as needed (pain).   Past Week at Unknown time  . paliperidone (INVEGA SUSTENNA) 156 MG/ML SUSY injection Inject 1 mL (156 mg total) into the muscle every 28 (twenty-eight) days. (Due on 11-20-17): For mood control (Patient not taking: Reported on 06/29/2018) 1.2 mL 0 Not Taking at Unknown time  . traZODone (DESYREL) 50 MG tablet Take 1 tablet (50 mg total) by mouth at bedtime as needed for sleep. 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   Physician Treatment Plan for Primary Diagnosis: Schizoaffective disorder, bipolar type ([place]) Long Term Goal(s):     Short Term Goals:    Medication Management: Evaluate patient's response, side effects, and tolerance of medication regimen.  Therapeutic Interventions: 1 to 1 sessions, Unit Group sessions and Medication  administration.  Evaluation of Outcomes: Not Met  Physician Treatment Plan for Secondary Diagnosis: Principal Problem:   Schizoaffective disorder, bipolar type ([place]) Active Problems:   Tobacco use disorder  Long Term Goal(s):     Short Term Goals:       Medication Management: Evaluate patient's response, side effects, and tolerance of medication regimen.  Therapeutic Interventions: 1 to 1 sessions, Unit Group sessions and Medication administration.  Evaluation of Outcomes: Not Met   RN Treatment Plan for Primary Diagnosis: Schizoaffective disorder, bipolar type ([place]) Long Term Goal(s): Knowledge of disease and therapeutic regimen to maintain health will improve  Short Term Goals: Ability to participate in decision making will improve, Ability to verbalize feelings will improve, Ability to disclose and discuss suicidal ideas, Ability to identify and develop effective coping behaviors will improve and Compliance with prescribed medications will improve  Medication Management: RN will administer medications as ordered by provider, will assess and evaluate patient's response and provide education to patient for prescribed medication. RN will report any adverse and/or side effects to prescribing provider.  Therapeutic Interventions: 1 on 1 counseling sessions, Psychoeducation, Medication administration, Evaluate responses to treatment, Monitor vital signs and CBGs as ordered, Perform/monitor CIWA, COWS, AIMS and Fall Risk screenings as ordered, Perform wound care treatments as ordered.  Evaluation of Outcomes: Not Met   LCSW Treatment Plan for Primary Diagnosis: Schizoaffective disorder, bipolar type ([place]) Long Term Goal(s): Safe transition to appropriate next level of care at discharge, Engage patient in therapeutic group addressing interpersonal concerns.  Short Term Goals: Engage patient in aftercare planning with referrals and resources  Therapeutic Interventions: Assess for all  discharge needs, 1 to 1 time with Social worker, Explore available resources and support systems, Assess for adequacy in community support network, Educate family and significant other(s) on suicide prevention, Complete Psychosocial Assessment, Interpersonal group therapy.  Evaluation of Outcomes: Not Met   Progress in Treatment: Attending groups: No. Participating in groups: No. Taking medication as prescribed: Yes. Toleration medication: Yes. Family/Significant other contact made: No, will contact:  pt declined Patient understands diagnosis: No. Discussing patient identified problems/goals with staff: Yes. Medical problems stabilized or resolved: Yes. Denies suicidal/homicidal ideation: Yes. Issues/concerns per patient self-inventory: No. Other: NA  New problem(s) identified: No, Describe:  none reported  New Short Term/Long Term Goal(s): Pt was unresponsive when asked to identify her goal for this hospitalization. Pt became emotional during meeting, displaying tearfulness when asked about her goals.   Patient Goals:  None  Discharge Plan or Barriers: Pt will discharge and follow up with outpatient treatment  Reason for Continuation of Hospitalization: Medication stabilization  Estimated Length of Stay: 5-7 days  Recreational Therapy: Patient Stressors: N/A Patient Goal: Patient will engage in groups without prompting or encouragement from LRT x3 group sessions within 5 recreation therapy group sessions  Attendees: Patient: Theresa [name] 07/01/2018 2:16 PM  Physician: Orson Slick MD 07/01/2018 2:16 PM  Nursing: Jerry Caras RN 07/01/2018 2:16 PM  RN Care Manager: 07/01/2018 2:16 PM  Social Worker: Darren Lehman Prom LCSW Assunta Curtis LCSW  Minette Brine Moton LCSW 07/01/2018 2:16 PM  Recreational Therapist: Isaias Sakai Zeke Aker LRT 07/01/2018 2:16 PM  Other:  07/01/2018 2:16 PM  Other:  07/01/2018 2:16 PM  Other: 07/01/2018 2:16 PM    Scribe for Treatment Team: Yvette Rack,  LCSW 07/01/2018 2:16 PM

## 2018-07-02 ENCOUNTER — Encounter: Payer: Self-pay | Admitting: Obstetrics and Gynecology

## 2018-07-02 ENCOUNTER — Ambulatory Visit: Payer: Medicaid Other | Admitting: Obstetrics and Gynecology

## 2018-07-02 LAB — TSH: TSH: 1.114 u[IU]/mL (ref 0.350–4.500)

## 2018-07-02 LAB — HCG, QUANTITATIVE, PREGNANCY: hCG, Beta Chain, Quant, S: <1 m[IU]/mL (ref <5)

## 2018-07-02 MED ORDER — PALIPERIDONE ER 3 MG PO TB24
9.0000 mg | ORAL_TABLET | Freq: Every day | ORAL | Status: DC
  Administered 2018-07-02: 9 mg via ORAL
  Filled 2018-07-02: qty 3

## 2018-07-02 MED ORDER — PALIPERIDONE ER 3 MG PO TB24
6.0000 mg | ORAL_TABLET | Freq: Every day | ORAL | Status: DC
  Administered 2018-07-03 – 2018-07-06 (×4): 6 mg via ORAL
  Filled 2018-07-02 (×4): qty 2

## 2018-07-02 MED ORDER — BENZTROPINE MESYLATE 1 MG PO TABS
1.0000 mg | ORAL_TABLET | Freq: Two times a day (BID) | ORAL | Status: DC
  Administered 2018-07-02 – 2018-07-06 (×8): 1 mg via ORAL
  Filled 2018-07-02 (×7): qty 1

## 2018-07-02 NOTE — Progress Notes (Addendum)
Patient is alert and oriented x 4, denies SI/HI/AVH, affect is blunted, she is dismissive and irritable of staff. Patient was noted pacing the milieu earlier in the shift and every time a staff member approached them she ignored and kept on walking. Patient's thoughts are organized and coherent. Patient is not complaint with evening medication regimen, when approached with medication she screamed saying " No l don't want any medicine" 15 minutes safety checks maintained, will continue to monitor.

## 2018-07-02 NOTE — Progress Notes (Signed)
Recreation Therapy Notes  Date: 07/02/2018  Time: 9:30 am   Location: Craft room   Behavioral response: N/A   Intervention Topic: Social skills  Discussion/Intervention: Patient did not attend group.   Clinical Observations/Feedback:  Patient did not attend group.   Krystal Teachey LRT/CTRS        Dela Sweeny 07/02/2018 10:56 AM

## 2018-07-02 NOTE — Progress Notes (Signed)
[facility] Progress [facility]  07/02/2018 10:02 AM Abel Prestollison Mae Uzzle  MRN:  [id number]  Subjective:    Ms. Sullivan LoneGilbert shows little progress. Still paranoid, delusional, hallucinating, fearful, and tearful. Accepted Invega sustenna injection last night. Slept 6 hours.  Spoke extensively with her mother who believes Invega injections were the best but for a period of time, the patient had no access due to lack of insurance. She has disability and Medicaid now but has been away from home, off medications and using drugs for the past two months. Mother noticed the patient was psychotic and "manic" before she brought her to the hospital.  Principal Problem: Schizoaffective disorder, bipolar type (HCC) Diagnosis: Principal Problem:   Schizoaffective disorder, bipolar type (H[facility]) Active Problems:   Tobacco use disorder  Total Time spent with patient: 20 minutes  Past Psychiatric History: schizoaffective disorder.  Past Medical History:  Past Medical History:  Diagnosis Date  . Abnormal vaginal Pap smear   . Anxiety   . Bipolar disorder (HCC)   . Depression   . Migraine without aura   . Polycystic ovary syndrome   . Psychosis (HCC)   . Schizophrenia (HCC)   . STD (sexually transmitted disease) 10-14-15   chlamydia    History reviewed. No pertinent surgical history. Family History:  Family History  Problem Relation Age of Onset  . Hypertension Mother   . Heart disease Mother   . Cancer Mother   . Hypertension Other   . Depression Father   . Drug abuse Father   . Bipolar disorder Father   . Heart disease Maternal Grandmother   . Pancreatitis Maternal Grandfather   . Cancer Paternal Grandmother   . Cancer Paternal Grandfather    Family Psychiatric  History:grandfather with psychosis. Social History:  Social History   Substance and Sexual Activity  Alcohol Use Yes  . Alcohol/week: 0.0 standard drinks     Social History   Substance and Sexual Activity  Drug Use Yes  . Types:  Marijuana   Comment: pt denies    Social History   Socioeconomic History  . Marital status: Married    Spouse name: Not on file  . Number of children: Not on file  . Years of education: Not on file  . Highest education level: Not on file  Occupational History  . Not on file  Social Needs  . Financial resource strain: Not on file  . Food insecurity:    Worry: Not on file    Inability: Not on file  . Transportation needs:    Medical: Not on file    Non-medical: Not on file  Tobacco Use  . Smoking status: Current Every Day Smoker    Packs/day: 0.50    Types: Cigarettes  . Smokeless tobacco: Never Used  Substance and Sexual Activity  . Alcohol use: Yes    Alcohol/week: 0.0 standard drinks  . Drug use: Yes    Types: Marijuana    Comment: pt denies  . Sexual activity: Not Currently    Partners: Male    Birth control/protection: None  Lifestyle  . Physical activity:    Days per week: Not on file    Minutes per session: Not on file  . Stress: Not on file  Relationships  . Social connections:    Talks on phone: Not on file    Gets together: Not on file    Attends religious service: Not on file    Active member of club or organization: Not on file  Attends meetings of clubs or organizations: Not on file    Relationship status: Not on file  Other Topics Concern  . Not on file  Social History Narrative  . Not on file   Additional Social History:                         Sleep: Fair  Appetite:  Fair  Current Medications: Current Facility-Administered Medications  Medication Dose Route Frequency Provider Last Rate Last Dose  . acetaminophen (TYLENOL) tablet 650 mg  650 mg Oral Q6H PRN Thomspon, Adela Lank, NP      . albuterol (PROVENTIL HFA;VENTOLIN HFA) 108 (90 Base) MCG/ACT inhaler 1-2 puff  1-2 puff Inhalation Q6H PRN Catalina Gravel, NP      . alum & mag hydroxide-simeth (MAALOX/MYLANTA) 200-200-20 MG/5ML suspension 30 mL  30 mL Oral Q6H PRN  Thomspon, Adela Lank, NP      . hydrOXYzine (ATARAX/VISTARIL) tablet 50 mg  50 mg Oral Q6H PRN Thomspon, Adela Lank, NP      . ibuprofen (ADVIL,MOTRIN) tablet 600 mg  600 mg Oral Q8H PRN Thomspon, Adela Lank, NP      . magnesium hydroxide (MILK OF MAGNESIA) suspension 30 mL  30 mL Oral Daily PRN Thomspon, Adela Lank, NP      . nicotine (NICODERM CQ - dosed in mg/24 hours) patch 14 mg  14 mg Transdermal Daily Lonn Im B, MD      . OLANZapine zydis (ZYPREXA) disintegrating tablet 20 mg  20 mg Oral QHS Haley Roza B, MD   20 mg at 06/30/18 2127  . ondansetron (ZOFRAN) tablet 4 mg  4 mg Oral Q8H PRN Catalina Gravel, NP      . Melene Muller ON 07/05/2018] paliperidone (INVEGA SUSTENNA) injection 156 mg  156 mg Intramuscular Once Silvia Markuson B, MD      . paliperidone (INVEGA SUSTENNA) injection 234 mg  234 mg Intramuscular Q28 days Candice Lunney B, MD   234 mg at 07/01/18 1721  . traZODone (DESYREL) tablet 100 mg  100 mg Oral QHS Keino Placencia B, MD   100 mg at 06/30/18 2127    Lab Results:  Results for orders placed or performed during the hospital encounter of 06/30/18 (from the past 48 hour(s))  Lipid panel     Status: None   Collection Time: 07/01/18  4:52 PM  Result Value Ref Range   Cholesterol 147 0 - 200 mg/dL   Triglycerides 54 <161 mg/dL   HDL 45 >09 mg/dL   Total CHOL/HDL Ratio 3.3 RATIO   VLDL 11 0 - 40 mg/dL   LDL Cholesterol 91 0 - 99 mg/dL    Comment:        Total Cholesterol/HDL:CHD Risk Coronary Heart Disease Risk Table                     Men   Women  1/2 Average Risk   3.4   3.3  Average Risk       5.0   4.4  2 X Average Risk   9.6   7.1  3 X Average Risk  23.4   11.0        Use the calculated Patient Ratio above and the CHD Risk Table to determine the patient's CHD Risk.        ATP III CLASSIFICATION (LDL):  <100     mg/dL   Optimal  604-540  mg/dL   Near or Above  Optimal  130-159  mg/dL   Borderline  010-071   mg/dL   High  >219     mg/dL   Very High Performed at [facility], [address] [place], [place] [address]   Hemoglobin A1c     Status: None   Collection Time: 07/01/18  4:52 PM  Result Value Ref Range   Hgb A1c MFr Bld 5.2 4.8 - 5.6 %    Comment: (NOTE) Pre diabetes:          5.7%-6.4% Diabetes:              >6.4% Glycemic control for   <7.0% adults with diabetes    Mean Plasma Glucose 102.54 mg/dL    Comment: Performed at [facility], [address] [place], [place] [address]  TSH     Status: None   Collection Time: 07/01/18  4:52 PM  Result Value Ref Range   TSH 1.114 0.350 - 4.500 uIU/mL    Comment: Performed by a 3rd Generation assay with a functional sensitivity of <=0.01 uIU/mL. Performed at [facility], [address] [place], [place] [address]     Blood Alcohol level:  Lab Results  Component Value Date   [facility] <10 06/29/2018   ETH <10 05/13/2018    Metabolic Disorder Labs: Lab Results  Component Value Date   HGBA1C 5.2 07/01/2018   MPG 102.54 07/01/2018   MPG 99.67 10/19/2017   Lab Results  Component Value Date   PROLACTIN 73.5 (H) 10/19/2017   PROLACTIN 4.0 10/14/2015   Lab Results  Component Value Date   CHOL 147 07/01/2018   TRIG 54 07/01/2018   HDL 45 07/01/2018   CHOLHDL 3.3 07/01/2018   VLDL 11 07/01/2018   LDLCALC 91 07/01/2018   LDLCALC 86 10/19/2017    Physical Findings: AIMS: Facial and Oral Movements Muscles of Facial Expression: None, normal Lips and Perioral Area: None, normal Jaw: None, normal Tongue: None, normal,Extremity Movements Upper (arms, wrists, hands, fingers): None, normal Lower (legs, knees, ankles, toes): None, normal, Trunk Movements Neck, shoulders, hips: None, normal, Overall Severity Severity of abnormal movements (highest score from questions above): None, normal Incapacitation due to abnormal movements: None, normal Patient's awareness of abnormal movements (rate only patient's  report): No Awareness, Dental Status Current problems with teeth and/or dentures?: No Does patient usually wear dentures?: No  CIWA:  CIWA-Ar Total: 5 COWS:  COWS Total Score: 3  Musculoskeletal: Strength & Muscle Tone: within normal limits Gait & Station: normal Patient leans: N/A  Psychiatric Specialty Exam: Physical Exam  Nursing note and vitals reviewed. Psychiatric: Her mood appears anxious. Her speech is delayed. She is slowed, withdrawn and actively hallucinating. Thought content is paranoid and delusional. Cognition and memory are impaired. She expresses impulsivity.    Review of Systems  Neurological: Negative.   Psychiatric/Behavioral: Negative.   All other systems reviewed and are negative.   Blood pressure 122/71, pulse 88, temperature 98.6 F (37 C), temperature source Oral, resp. rate 18, height 5\' 4"  (1.626 m), weight 79.4 kg, SpO2 99 %.Body mass index is 30.04 kg/m.  General Appearance: Disheveled  Eye Contact:  Fair  Speech:  Blocked  Volume:  Decreased  Mood:  Anxious  Affect:  Labile and Tearful  Thought Process:  Irrelevant  Orientation:  Full (Time, Place, and Person)  Thought Content:  Illogical, Delusions, Hallucinations: Auditory and Paranoid Ideation  Suicidal Thoughts:  No  Homicidal Thoughts:  No  Memory:  Immediate;  Poor Recent;   Poor Remote;   Poor  Judgement:  Poor  Insight:  Lacking  Psychomotor Activity:  Decreased  Concentration:  Concentration: Poor and Attention Span: Poor  Recall:  Poor  Fund of Knowledge:  Poor  Language:  Poor  Akathisia:  No  Handed:  Right  AIMS (if indicated):     Assets:  Communication Skills Desire for Improvement Financial Resources/Insurance Housing Physical Health Resilience Social Support Transportation  ADL's:  Intact  Cognition:  WNL  Sleep:  Number of Hours: 6     Treatment Plan Summary: Daily contact with patient to assess and evaluate symptoms and progress in treatment and Medication  management   Ms. Sipp is a 23 year old female with a history od schizophrenia admitted floridly psychotic in the context of medication noncomplinace and substance abuse.  #Psychosis -discontinue Zyprexa -oral Invega 9 mg daily  -Invega sustenna 234 mg every 28 days started on 07/01/2018, next dose on 07/29/2018 -second dose of 156 mg on 07/05/2018 -Trazodone 100 mg nightly  #Substance abuse -abusing amphetamines -patient not ready to address problems  #Smoking cessation -nicotine patch available  #Labs -lipid panel, TSH, A1C are normal -EKG reviewed, NSR with Tc 460 -pregnancy test is negative  #Disposition -discharge to home with family -follow up with [facility] in [place], MD 07/02/2018, 10:02 AM

## 2018-07-02 NOTE — Plan of Care (Signed)
Problem: Medication: Goal: Compliance with prescribed medication regimen will improve Outcome: Progressing   Problem: Coping: Goal: Coping ability will improve Outcome: Progressing DAR Note: Pt observed in hall on initial approach. Denies SI, HI, AVH and pain when assessed. Presents with flat affect and depressed mood. Reports fair sleep last night with good appetite, normal energy and good concentration level. Rates her anxiety, depression and hopelessness all 0/10. However, pt did stated that "at times I feel like people are after me". Pt's goal for today is to "figure out which meds for sleep is best, take sleep meds tonight". Emotional support and reassurance offered to pt. Encouraged pt to voice concerns, comply with current treatment regimen including medications. All scheduled medications administered as ordered with verbal education and effects monitored. MDOC Dr. Jennet Maduro notified of pt's complaint of muscle stiffness, new order received for Cogentin 1 mg PO BID.  Safety checks maintained without self harm gestures or outburst to note at this time. Pt has been medication compliant. Reports muscle stiffness in her back and legs this evening. Pt did not attend groups despite multiple prompts / encouragement. POC continues for safety and mood stability.

## 2018-07-02 NOTE — Plan of Care (Signed)
Patient was compliant with medication administration per MD orders. Patient was isolative to her room this evening.  Problem: Medication: Goal: Compliance with prescribed medication regimen will improve Outcome: Progressing   Problem: Activity: Goal: Interest or engagement in leisure activities will improve Outcome: Not Progressing

## 2018-07-02 NOTE — [facility] Group Notes (Signed)
Balance In Life 07/02/2018 1PM  Type of Therapy/Topic:  Group Therapy:  Balance in Life  Participation Level:  Did Not Attend  Description of Group:   This group will address the concept of balance and how it feels and looks when one is unbalanced. Patients will be encouraged to process areas in their lives that are out of balance and identify reasons for remaining unbalanced. Facilitators will guide patients in utilizing problem-solving interventions to address and correct the stressor making their life unbalanced. Understanding and applying boundaries will be explored and addressed for obtaining and maintaining a balanced life. Patients will be encouraged to explore ways to assertively make their unbalanced needs known to significant others in their lives, using other group members and facilitator for support and feedback.  Therapeutic Goals: 1. Patient will identify two or more emotions or situations they have that consume much of in their lives. 2. Patient will identify signs/triggers that life has become out of balance:  3. Patient will identify two ways to set boundaries in order to achieve balance in their lives:  4. Patient will demonstrate ability to communicate their needs through discussion and/or role plays  Summary of Patient Progress:    Therapeutic Modalities:   Cognitive Behavioral Therapy Solution-Focused Therapy Assertiveness Training  Aditi Rovira T Etienne Mowers, LCSW  

## 2018-07-02 NOTE — Progress Notes (Signed)
D - Patient was in her room upon arrival to the unit. Patient was pleasant during assessment and medication administration. Patient denies SI/HI/AVH and pain with this Clinical research associate. Although the patient did deny AVH she did state, "I don't hear or see anything that isn't there, but I do feel a presence." Patient assessed about the presence and said, "I feel safe, it's not trying to hurt me, its just there." Patient rated her depression 5/10 and her anxiety 4/10. Patient stated, "I didn't sleep well last night, so I am really hoping to get a good nights sleep tonight." Patient was isolative to her room this evening and didn't participate in any group activity.    A - Patient was compliant with medication administration per MD orders and procedures on the unit. Patient was given education. Patient was given support and encouragement to be active in her treatment plan. Patient informed to let staff know if there are any issues or problems on the unit.   R - Patient bring monitored Q 15 minutes for safety per unit protocol. Patient remains safe on the unit at this time.

## 2018-07-02 NOTE — Plan of Care (Signed)
  Problem: Medication: Goal: Compliance with prescribed medication regimen will improve Outcome: Not Progressing  Patient refused evening medication regimen.

## 2018-07-03 NOTE — Progress Notes (Signed)
DAR Note: Pt visible in milieu at intervals during shift. Observed interacting well with peers and staff. Presents with animated affect / smiling brightens up and forwards when engaged. However, pt is still disorganized but redirectable. Pt attended scheduled group this morning and was engaged. Tolerates all PO intake well. Compliant with medications when offered. Denies muscle stiffness when assessed. Emotional support offered. Pt encouraged to comply with current treatment regimen including groups. All medications given per MD's orders with verbal education and effects monitored.  Q15 minutes safety checks maintained without incident to note at this time.  Pt receptive to care. POC maintained for safety and mood stability.

## 2018-07-03 NOTE — [facility] Group Notes (Signed)
LCSW Group Therapy Note  07/03/2018 1:00 PM  Type of Therapy and Topic:  Group Therapy:  Feelings around Relapse and Recovery  Participation Level:  Did Not Attend   Description of Group:    Patients in this group will discuss emotions they experience before and after a relapse. They will process how experiencing these feelings, or avoidance of experiencing them, relates to having a relapse. Facilitator will guide patients to explore emotions they have related to recovery. Patients will be encouraged to process which emotions are more powerful. They will be guided to discuss the emotional reaction significant others in their lives may have to their relapse or recovery. Patients will be assisted in exploring ways to respond to the emotions of others without this contributing to a relapse.  Therapeutic Goals: 1. Patient will identify two or more emotions that lead to a relapse for them 2. Patient will identify two emotions that result when they relapse 3. Patient will identify two emotions related to recovery 4. Patient will demonstrate ability to communicate their needs through discussion and/or role plays   Summary of Patient Progress:  x   Therapeutic Modalities:   Cognitive Behavioral Therapy Solution-Focused Therapy Assertiveness Training Relapse Prevention Therapy   Penni Homans, MSW, LCSW 07/03/2018 2:08 PM

## 2018-07-03 NOTE — Progress Notes (Signed)
Recreation Therapy Notes   Date: 07/03/2018  Time: 9:30 am  Location: Craft Room  Behavioral response: Appropriate  Intervention Topic: Leisure  Discussion/Intervention:  Group content today was focused on leisure. The group defined what leisure is and some positive leisure activities they participate in. Individuals identified the difference between good and bad leisure. Participants expressed how they feel after participating in the leisure of their choice. The group discussed how they go about picking a leisure activity and if others are involved in their leisure activities. The patient stated how many leisure activities they too choose from and reasons why it is important to have leisure time. Individuals participated in the intervention "Leisure Jeopardy" where they had a chance to identify new leisure activities as well as benefits of leisure. Clinical Observations/Feedback:  Patient came to group and identified positive leisure activities. Individual was social with peers and staff while participating in the intervention. Participant was engaged in intervention. Sebastien Jackson LRT/CTRS         Akbar Sacra 07/03/2018 10:30 AM 

## 2018-07-03 NOTE — Progress Notes (Signed)
[facility]  07/03/2018 9:30 AM Theresa [name]  MRN:  [id number]  Subjective:    Theresa [name] has a history of schizoaffective disorder off medications for two months restarted on Invega that works well for her.  Last night Theresa [name] [facility], this has been her complaint always for all antipsychotics. She was started on Cogentin, feels better today. She insists that she was talk, she is leaving today but it is impossible until her second Invega injection this weekend. She is up and about, smiling but still terribly disorganized in her thinking.  Principal Problem: Schizoaffective disorder, bipolar type (HCC) Diagnosis: Principal Problem:   Schizoaffective disorder, bipolar type (HCC) Active Problems:   Tobacco use disorder  Total Time spent with patient: 20 minutes  Past Psychiatric History: schizoaffective disorder  Past Medical History:  Past Medical History:  Diagnosis Date  . Abnormal vaginal Pap smear   . Anxiety   . Bipolar disorder (HCC)   . Depression   . Migraine without aura   . Polycystic ovary syndrome   . Psychosis (HCC)   . Schizophrenia (HCC)   . STD (sexually transmitted disease) 10-14-15   chlamydia    History reviewed. No pertinent surgical history. Family History:  Family History  Problem Relation Age of Onset  . Hypertension Mother   . Heart disease Mother   . Cancer Mother   . Hypertension Other   . Depression Father   . Drug abuse Father   . Bipolar disorder Father   . Heart disease Maternal Grandmother   . Pancreatitis Maternal Grandfather   . Cancer Paternal Grandmother   . Cancer Paternal Grandfather    Family Psychiatric  History: none Social History:  Social History   Substance and Sexual Activity  Alcohol Use Yes  . Alcohol/week: 0.0 standard drinks     Social History   Substance and Sexual Activity  Drug Use Yes  . Types: Marijuana   Comment: pt denies    Social History   Socioeconomic  History  . Marital status: Married    Spouse name: Not on file  . Number of children: Not on file  . Years of education: Not on file  . Highest education level: Not on file  Occupational History  . Not on file  Social Needs  . Financial resource strain: Not on file  . Food insecurity:    Worry: Not on file    Inability: Not on file  . Transportation needs:    Medical: Not on file    Non-medical: Not on file  Tobacco Use  . Smoking status: Current Every Day Smoker    Packs/day: 0.50    Types: Cigarettes  . Smokeless tobacco: Never Used  Substance and Sexual Activity  . Alcohol use: Yes    Alcohol/week: 0.0 standard drinks  . Drug use: Yes    Types: Marijuana    Comment: pt denies  . Sexual activity: Not Currently    Partners: Male    Birth control/protection: None  Lifestyle  . Physical activity:    Days per week: Not on file    Minutes per session: Not on file  . Stress: Not on file  Relationships  . Social connections:    Talks on phone: Not on file    Gets together: Not on file    Attends religious service: Not on file    Active member of club or organization: Not on file    Attends meetings of clubs  or organizations: Not on file    Relationship status: Not on file  Other Topics Concern  . Not on file  Social History Narrative  . Not on file   Additional Social History:                         Sleep: Fair  Appetite:  Fair  Current Medications: Current Facility-Administered Medications  Medication Dose Route Frequency Provider Last Rate Last Dose  . acetaminophen (TYLENOL) tablet 650 mg  650 mg Oral Q6H PRN Thomspon, Adela Lank, NP      . albuterol (PROVENTIL HFA;VENTOLIN HFA) 108 (90 Base) MCG/ACT inhaler 1-2 puff  1-2 puff Inhalation Q6H PRN Catalina Gravel, NP      . alum & mag hydroxide-simeth (MAALOX/MYLANTA) 200-200-20 MG/5ML suspension 30 mL  30 mL Oral Q6H PRN Catalina Gravel, NP      . benztropine (COGENTIN) tablet 1 mg  1 mg  Oral BID Pucilowski, Olgierd A, MD   1 mg at 07/03/18 4650  . hydrOXYzine (ATARAX/VISTARIL) tablet 50 mg  50 mg Oral Q6H PRN Catalina Gravel, NP      . ibuprofen (ADVIL,MOTRIN) tablet 600 mg  600 mg Oral Q8H PRN Thomspon, Adela Lank, NP      . magnesium hydroxide (MILK OF MAGNESIA) suspension 30 mL  30 mL Oral Daily PRN Thomspon, Adela Lank, NP      . nicotine (NICODERM CQ - dosed in mg/24 hours) patch 14 mg  14 mg Transdermal Daily Peggy Loge B, MD      . ondansetron (ZOFRAN) tablet 4 mg  4 mg Oral Q8H PRN Catalina Gravel, NP      . Melene Muller ON 07/05/2018] paliperidone (INVEGA SUSTENNA) injection 156 mg  156 mg Intramuscular Once Nyhla Mountjoy B, MD      . paliperidone (INVEGA SUSTENNA) injection 234 mg  234 mg Intramuscular Q28 days Vail Vuncannon B, MD   234 mg at 07/01/18 1721  . paliperidone (INVEGA) 24 hr tablet 6 mg  6 mg Oral Daily Zayonna Ayuso B, MD   6 mg at 07/03/18 0828  . traZODone (DESYREL) tablet 100 mg  100 mg Oral QHS Lev Cervone B, MD   100 mg at 07/02/18 2116    Lab Results:  Results for orders placed or performed during the hospital encounter of 06/30/18 (from the past 48 hour(s))  Lipid panel     Status: None   Collection Time: 07/01/18  4:52 PM  Result Value Ref Range   Cholesterol 147 0 - 200 mg/dL   Triglycerides 54 <354 mg/dL   HDL 45 >65 mg/dL   Total CHOL/HDL Ratio 3.3 RATIO   VLDL 11 0 - 40 mg/dL   LDL Cholesterol 91 0 - 99 mg/dL    Comment:        Total Cholesterol/HDL:CHD Risk Coronary Heart Disease Risk Table                     Men   Women  1/2 Average Risk   3.4   3.3  Average Risk       5.0   4.4  2 X Average Risk   9.6   7.1  3 X Average Risk  23.4   11.0        Use the calculated Patient Ratio above and the CHD Risk Table to determine the patient's CHD Risk.        ATP III CLASSIFICATION (LDL):  <100  mg/dL   Optimal  [id number]  mg/dL   Near or Above                    Optimal  130-159  mg/dL    Borderline  [id number]  mg/dL   High  >130>190     mg/dL   Very High Performed at [facility], [address] [place], [place] [address]   Hemoglobin A1c     Status: None   Collection Time: 07/01/18  4:52 PM  Result Value Ref Range   Hgb A1c MFr Bld 5.2 4.8 - 5.6 %    Comment: (NOTE) Pre diabetes:          5.7%-6.4% Diabetes:              >6.4% Glycemic control for   <7.0% adults with diabetes    Mean Plasma Glucose 102.54 mg/dL    Comment: Performed at [facility], [address] [place], [place] [address]  TSH     Status: None   Collection Time: 07/01/18  4:52 PM  Result Value Ref Range   TSH 1.114 0.350 - 4.500 uIU/mL    Comment: Performed by a 3rd Generation assay with a functional sensitivity of <=0.01 uIU/mL. Performed at [facility], [address] [place], [place] [address]   hCG, quantitative, pregnancy     Status: None   Collection Time: 07/01/18  4:52 PM  Result Value Ref Range   hCG, Beta Chain, Quant, S <1 <5 mIU/mL    Comment:          GEST. AGE      CONC.  (mIU/mL)   <=1 WEEK        5 - 50     2 WEEKS       50 - 500     3 WEEKS       100 - 10,000     4 WEEKS     1,000 - 30,000     5 WEEKS     3,500 - 115,000   6-8 WEEKS     12,000 - 270,000    12 WEEKS     15,000 - 220,000        FEMALE AND NON-PREGNANT FEMALE:     LESS THAN 5 mIU/mL Performed at [facility], [address] [place], [place] [address]     Blood Alcohol level:  Lab Results  Component Value Date   [facility] <10 06/29/2018   ETH <10 05/13/2018    Metabolic Disorder Labs: Lab Results  Component Value Date   HGBA1C 5.2 07/01/2018   MPG 102.54 07/01/2018   MPG 99.67 10/19/2017   Lab Results  Component Value Date   PROLACTIN 73.5 (H) 10/19/2017   PROLACTIN 4.0 10/14/2015   Lab Results  Component Value Date   CHOL 147 07/01/2018   TRIG 54 07/01/2018   HDL 45 07/01/2018   CHOLHDL 3.3 07/01/2018   VLDL 11 07/01/2018   LDLCALC 91 07/01/2018    LDLCALC 86 10/19/2017    Physical Findings: AIMS: Facial and Oral Movements Muscles of Facial Expression: None, normal Lips and Perioral Area: None, normal Jaw: None, normal Tongue: None, normal,Extremity Movements Upper (arms, wrists, hands, fingers): None, normal Lower (legs, knees, ankles, toes): None, normal, Trunk Movements Neck, shoulders, hips: None, normal, Overall Severity Severity of abnormal movements (highest score from questions above): None, normal Incapacitation due to abnormal movements: None, normal Patient's awareness of abnormal movements (rate  only patient's report): No Awareness, Dental Status Current problems with teeth and/or dentures?: No Does patient usually wear dentures?: No  CIWA:  CIWA-Ar Total: 5 COWS:  COWS Total Score: 3  Musculoskeletal: Strength & Muscle Tone: within normal limits Gait & Station: normal Patient leans: N/A  Psychiatric Specialty Exam: Physical Exam  Nursing note and vitals reviewed. Psychiatric: Her speech is normal and behavior is normal. Thought content normal. Her mood appears anxious. Her affect is blunt. Cognition and memory are normal. She expresses impulsivity.    Review of Systems  Neurological: Negative.   Psychiatric/Behavioral: Negative.   All other systems reviewed and are negative.   Blood pressure 135/72, pulse 75, temperature 97.7 F (36.5 C), temperature source Oral, resp. rate 18, height 5\' 4"  (1.626 m), weight 79.4 kg, SpO2 98 %.Body mass index is 30.04 kg/m.  General Appearance: Casual  Eye Contact:  Good  Speech:  Clear and Coherent  Volume:  Normal  Mood:  Euthymic  Affect:  Blunt  Thought Process:  Linear  Orientation:  Full (Time, Place, and Person)  Thought Content:  Delusions, Hallucinations: Auditory and Paranoid Ideation  Suicidal Thoughts:  No  Homicidal Thoughts:  No  Memory:  Immediate;   Poor Recent;   Poor Remote;   Poor  Judgement:  Poor  Insight:  Lacking  Psychomotor Activity:   Normal  Concentration:  Concentration: Poor and Attention Span: Poor  Recall:  Poor  Fund of Knowledge:  Fair  Language:  Fair  Akathisia:  No  Handed:  Right  AIMS (if indicated):     Assets:  Communication Skills Desire for Improvement Financial Resources/Insurance Housing Physical Health Resilience Social Support Transportation  ADL's:  Intact  Cognition:  WNL  Sleep:  Number of Hours: 7.3     Treatment Plan Summary: Daily contact with patient to assess and evaluate symptoms and progress in treatment and Medication management   Ms. Gilchrist is a 23 year old female with a history od schizophrenia admitted floridly psychotic in the context of medication noncomplinace and substance abuse.  #Psychosis, improving -lower oral Invegato 6 mg daily  -Invega sustenna 234 mg every 28 days started on 07/01/2018, next dose on 07/29/2018 -second dose of 156 mg on 07/05/2018 -add Cogentin 1 mg BID -Trazodone 100 mg nightly  #Substance abuse -abusing amphetamines -patient not ready to address problems  #Smoking cessation -nicotine patch available  #Labs -lipid panel, TSH, A1C are normal -EKG reviewed, NSR with Tc 460 -pregnancy testis negative  #Disposition -discharge to home with family -follow up with [facility] in [place], MD 07/03/2018, 9:30 AM

## 2018-07-04 DIAGNOSIS — F17200 Nicotine dependence, unspecified, uncomplicated: Secondary | ICD-10-CM

## 2018-07-04 DIAGNOSIS — F1510 Other stimulant abuse, uncomplicated: Secondary | ICD-10-CM

## 2018-07-04 MED ORDER — GUAIFENESIN ER 600 MG PO TB12
600.0000 mg | ORAL_TABLET | Freq: Two times a day (BID) | ORAL | Status: DC
  Administered 2018-07-04 – 2018-07-06 (×4): 600 mg via ORAL
  Filled 2018-07-04 (×5): qty 1

## 2018-07-04 MED ORDER — DM-GUAIFENESIN ER 30-600 MG PO TB12: 1.0000 | ORAL_TABLET | Freq: Two times a day (BID) | ORAL | Status: DC

## 2018-07-04 MED ORDER — DEXTROMETHORPHAN POLISTIREX ER 30 MG/5ML PO SUER
30.0000 mg | Freq: Two times a day (BID) | ORAL | Status: DC
  Administered 2018-07-04 – 2018-07-06 (×4): 30 mg via ORAL
  Filled 2018-07-04 (×5): qty 5

## 2018-07-04 NOTE — [facility] Group Notes (Signed)
LCSW Group Therapy Note  07/04/2018 1:15pm  Type of Therapy and Topic: Group Therapy: Holding on to Grudges   Participation Level: Active   Description of Group:  In this group patients will be asked to explore and define a grudge. Patients will be guided to discuss their thoughts, feelings, and reasons as to why people have grudges. Patients will process the impact grudges have on daily life and identify thoughts and feelings related to holding grudges. Facilitator will challenge patients to identify ways to let go of grudges and the benefits this provides. Patients will be confronted to address why one struggles letting go of grudges. Lastly, patients will identify feelings and thoughts related to what life would look like without grudges. This group will be process-oriented, with patients participating in exploration of their own experiences, giving and receiving support, and processing challenge from other group members.  Therapeutic Goals:  1. Patient will identify specific grudges related to their personal life.  2. Patient will identify feelings, thoughts, and beliefs around grudges.  3. Patient will identify how one releases grudges appropriately.  4. Patient will identify situations where they could have let go of the grudge, but instead chose to hold on.   Summary of Patient Progress: The patient reported that  she feels "good." Patients were guided to discuss their thoughts, feelings, and reasons as to why people have grudges. The patient was able to process the impact grudges have on daily life and identified thoughts and feelings related to holding grudges. The patient was challenged to identify ways to let go of grudges and the benefits this provides. Pt. actively and appropriately engaged in the group. Patient was able to provide support and validation to other group members.   Therapeutic Modalities:  Cognitive Behavioral Therapy  Solution Focused Therapy  Motivational  Interviewing  Brief Therapy   Emilyanne Mcgough  CUEBAS-COLON, LCSW 07/04/2018 9:53 AM

## 2018-07-04 NOTE — Progress Notes (Signed)
Patient alert and oriented x4, denies SI/HI/AVH no distress noted, affect ids bright upon interaction, no bizarre behavior, she's is receptive to staff, interacting appropriately with peers and staff, thoughts are organized and coherent no bizarre behavior,and she is compliant with medication. 15 minutes safety checks maintained will continue to monitor,

## 2018-07-04 NOTE — Progress Notes (Addendum)
[facility] MD Progress Note  07/04/2018 10:10 AM Theresa [name]  MRN:  [id number]  Subjective:    Theresa [name] has a history of schizoaffective disorder off medications for two months restarted on Invega that works well for her.  The patient states that the stiffness is slowly going away.  She does feel that her mood has improved and overall she is doing better.  She does feel a little sluggish at times.  She denies any current psychotic symptoms including auditory visual hallucinations.  No paranoid thoughts or delusions.  She says no bizarre behavior noted.  She could not focus in groups yesterday and does not know why.  She feels like it is because she is too tired.  Her mother and boyfriend did come to visit last night which was good.  She is aware of the need to follow-up as an outpatient in [place] after discharge.  No current somatic complaints other than fatigue.  Vital signs are stable.  She is eating fairly well and slept over 7 hours last night.   Past psychiatric history. Long history of schizoaffective disorder, since teenage years with multiple hospitalizations, medication trials and noncomplinace. No suicide attempts. There is a history of cannabis use.  Family psychiatric history. Grandfather with psychosis.  Social history. Denied diisability. Lives with her mother.   Principal Problem: Schizoaffective disorder, bipolar type (HCC) Diagnosis: Principal Problem:   Schizoaffective disorder, bipolar type (HCC) Active Problems:   Tobacco use disorder  Total Time spent with patient: 20 minutes   Past Medical History:  Past Medical History:  Diagnosis Date  . Abnormal vaginal Pap smear   . Anxiety   . Bipolar disorder (HCC)   . Depression   . Migraine without aura   . Polycystic ovary syndrome   . Psychosis (HCC)   . Schizophrenia (HCC)   . STD (sexually transmitted disease) 10-14-15   chlamydia    History reviewed. No pertinent surgical history. Family History:   Family History  Problem Relation Age of Onset  . Hypertension Mother   . Heart disease Mother   . Cancer Mother   . Hypertension Other   . Depression Father   . Drug abuse Father   . Bipolar disorder Father   . Heart disease Maternal Grandmother   . Pancreatitis Maternal Grandfather   . Cancer Paternal Grandmother   . Cancer Paternal Grandfather    Family Psychiatric  History: none Social History:  Social History   Substance and Sexual Activity  Alcohol Use Yes  . Alcohol/week: 0.0 standard drinks     Social History   Substance and Sexual Activity  Drug Use Yes  . Types: Marijuana   Comment: pt denies    Social History   Socioeconomic History  . Marital status: Married    Spouse name: Not on file  . Number of children: Not on file  . Years of education: Not on file  . Highest education level: Not on file  Occupational History  . Not on file  Social Needs  . Financial resource strain: Not on file  . Food insecurity:    Worry: Not on file    Inability: Not on file  . Transportation needs:    Medical: Not on file    Non-medical: Not on file  Tobacco Use  . Smoking status: Current Every Day Smoker    Packs/day: 0.50    Types: Cigarettes  . Smokeless tobacco: Never Used  Substance and Sexual Activity  . Alcohol use: Yes  Alcohol/week: 0.0 standard drinks  . Drug use: Yes    Types: Marijuana    Comment: pt denies  . Sexual activity: Not Currently    Partners: Male    Birth control/protection: None  Lifestyle  . Physical activity:    Days per week: Not on file    Minutes per session: Not on file  . Stress: Not on file  Relationships  . Social connections:    Talks on phone: Not on file    Gets together: Not on file    Attends religious service: Not on file    Active member of club or organization: Not on file    Attends meetings of clubs or organizations: Not on file    Relationship status: Not on file  Other Topics Concern  . Not on file   Social History Narrative  . Not on file       Sleep: Good  Appetite:  Good  Current Medications: Current Facility-Administered Medications  Medication Dose Route Frequency Provider Last Rate Last Dose  . acetaminophen (TYLENOL) tablet 650 mg  650 mg Oral Q6H PRN Thomspon, Adela Lank, NP      . albuterol (PROVENTIL HFA;VENTOLIN HFA) 108 (90 Base) MCG/ACT inhaler 1-2 puff  1-2 puff Inhalation Q6H PRN Catalina Gravel, NP      . alum & mag hydroxide-simeth (MAALOX/MYLANTA) 200-200-20 MG/5ML suspension 30 mL  30 mL Oral Q6H PRN Thomspon, Adela Lank, NP      . benztropine (COGENTIN) tablet 1 mg  1 mg Oral BID Pucilowski, Olgierd A, MD   1 mg at 07/04/18 0811  . hydrOXYzine (ATARAX/VISTARIL) tablet 50 mg  50 mg Oral Q6H PRN Catalina Gravel, NP   50 mg at 07/03/18 2136  . ibuprofen (ADVIL,MOTRIN) tablet 600 mg  600 mg Oral Q8H PRN Thomspon, Adela Lank, NP      . magnesium hydroxide (MILK OF MAGNESIA) suspension 30 mL  30 mL Oral Daily PRN Thomspon, Adela Lank, NP      . nicotine (NICODERM CQ - dosed in mg/24 hours) patch 14 mg  14 mg Transdermal Daily Pucilowska, Jolanta B, MD      . ondansetron (ZOFRAN) tablet 4 mg  4 mg Oral Q8H PRN Catalina Gravel, NP      . Melene Muller ON 07/05/2018] paliperidone (INVEGA SUSTENNA) injection 156 mg  156 mg Intramuscular Once Pucilowska, Jolanta B, MD      . paliperidone (INVEGA SUSTENNA) injection 234 mg  234 mg Intramuscular Q28 days Pucilowska, Jolanta B, MD   234 mg at 07/01/18 1721  . paliperidone (INVEGA) 24 hr tablet 6 mg  6 mg Oral Daily Pucilowska, Jolanta B, MD   6 mg at 07/04/18 0811  . traZODone (DESYREL) tablet 100 mg  100 mg Oral QHS Pucilowska, Jolanta B, MD   100 mg at 07/03/18 2136    Lab Results:  No results found for this or any previous visit (from the past 48 hour(s)).  Blood Alcohol level:  Lab Results  Component Value Date   ETH <10 06/29/2018   ETH <10 05/13/2018    Metabolic Disorder Labs: Lab Results  Component  Value Date   HGBA1C 5.2 07/01/2018   MPG 102.54 07/01/2018   MPG 99.67 10/19/2017   Lab Results  Component Value Date   PROLACTIN 73.5 (H) 10/19/2017   PROLACTIN 4.0 10/14/2015   Lab Results  Component Value Date   CHOL 147 07/01/2018   TRIG 54 07/01/2018   HDL 45 07/01/2018   CHOLHDL 3.3 07/01/2018   VLDL  11 07/01/2018   LDLCALC 91 07/01/2018   LDLCALC 86 10/19/2017    Physical Findings: AIMS: Facial and Oral Movements Muscles of Facial Expression: None, normal Lips and Perioral Area: None, normal Jaw: None, normal Tongue: None, normal,Extremity Movements Upper (arms, wrists, hands, fingers): None, normal Lower (legs, knees, ankles, toes): None, normal, Trunk Movements Neck, shoulders, hips: None, normal, Overall Severity Severity of abnormal movements (highest score from questions above): None, normal Incapacitation due to abnormal movements: None, normal Patient's awareness of abnormal movements (rate only patient's report): No Awareness, Dental Status Current problems with teeth and/or dentures?: No Does patient usually wear dentures?: No  CIWA:  CIWA-Ar Total: 5 COWS:  COWS Total Score: 3  Musculoskeletal: Strength & Muscle Tone: within normal limits Gait & Station: normal Patient leans: N/A  Psychiatric Specialty Exam: Physical Exam  Nursing note and vitals reviewed. Psychiatric: Her speech is normal and behavior is normal. Thought content normal. Her mood appears anxious. Her affect is blunt. Cognition and memory are normal. She expresses impulsivity.    Review of Systems  Constitutional: Negative.   HENT: Positive for congestion and sinus pain. Negative for hearing loss and tinnitus.        Sinus congestin x 2 days  Eyes: Negative.   Respiratory: Negative.   Cardiovascular: Negative.   Gastrointestinal: Negative.   Genitourinary: Negative.   Musculoskeletal: Negative.   Skin: Negative.   Neurological: Negative.   Endo/Heme/Allergies: Negative.    All other systems reviewed and are negative.   Blood pressure 131/70, pulse 78, temperature 97.8 F (36.6 C), temperature source Oral, resp. rate 18, height 5\' 4"  (1.626 m), weight 79.4 kg, SpO2 99 %.Body mass index is 30.04 kg/m.  General Appearance: Casual  Eye Contact:  Good  Speech:  Clear and coherent  Volume:  Normal  Mood: "OK"  Affect: Flat  Thought Process:  Logical and goal directed  Orientation:  Full (Time, Place, and Person)  Thought Content:  Nio current psychotic symptoms  Suicidal Thoughts:  No  Homicidal Thoughts:  No  Memory:  Immediate;   Fair Recent;   Fair Remote;   Fair  Judgement:  Poor by history  Insight:  Improving  Psychomotor Activity:  Normal  Concentration:  Concentration: Fair and Attention Span: Fair  Recall:  [employer]Fair  Fund of Knowledge:  Fair  Language:  Fair  Akathisia:  No  Handed:  Right  AIMS (if indicated):     Assets:  Communication Skills Desire for Improvement Financial Resources/Insurance Housing Physical Health Resilience Social Support Transportation  ADL's:  Intact  Cognition:  WNL  Sleep:  Number of Hours: 7.25     Treatment Plan Summary: Daily contact with patient to assess and evaluate symptoms and progress in treatment and Medication management   Ms. Sullivan LoneGilbert is a 23 year old female with a history od schizophrenia admitted floridly psychotic in the context of medication noncomplinace and substance abuse.  #Psychosis, improving -She will continue on Invega 6 mg p.o. daily -We will continue [place]vega Sustenna 234 mg every 28 days started on 07/01/2018, next dose on 07/29/2018 -Second dose of 156 mg on 07/05/2018 - For muscle stiffness, added Cogentin 1mg  po BID -Continue trazodone 100 mg p.o. 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 -The patient minimizes substance abuse and does not want substance abuse treatment at this time -She is advised to abstain from amphetamines and we will  continue to encourage the patient to enter meaningful recovery program at the time of discharge   #  Smoking cessation -She was offered a nicotine patch  #Sinus Congestion: Will offer Mucinex Sinus  #Labs -lipid panel, TSH, A1C are normal -EKG reviewed, NSR with Tc 460 -pregnancy testis negative  #Disposition -The patient will be discharged home with family -Psychotropic medication management follow[place] [facility] [place]   Aarti K Kapur, MD 07/04/2018, 10:10 AM

## 2018-07-04 NOTE — Plan of Care (Signed)
D: Patient denies SI, HI and AV hallucinations. Is asking questions about when she will have her next Invega injection. Medication teaching done and patient encouraged to reach out to her physician for additional questions about her treatment plan. Interacting appropriately with staff and peers. Mood is pleasant. Affect is appropriate to circumstance.  A: Continue to monitor for safety and offer support. R: Safety maintained 

## 2018-07-04 NOTE — Plan of Care (Signed)
Patient is alert and oriented X4. Denies SI, HI and AVH. Patient is much brighter today than recent, smiling and talking with staff. Patient is appropriate, stiffness in the neck is much better. Patient is taking medications, no observations of crying spells or catatonic behavior. Rates pain 0/10. Patient's appearance is appropriate. Safety checks Q 15 minutes. Problem: Education: Goal: Ability to make informed decisions regarding treatment will improve Outcome: Progressing   Problem: Coping: Goal: Coping ability will improve Outcome: Progressing   Problem: Medication: Goal: Compliance with prescribed medication regimen will improve Outcome: Progressing   Problem: Activity: Goal: Interest or engagement in leisure activities will improve Outcome: Progressing Goal: Imbalance in normal sleep/wake cycle will improve Outcome: Progressing

## 2018-07-04 NOTE — Plan of Care (Signed)
  Problem: Education: Goal: Ability to make informed decisions regarding treatment will improve Outcome: Progressing  Patient can make informed decisions regarding treatment  

## 2018-07-04 NOTE — Progress Notes (Signed)
D: Patient denies SI, HI and AV hallucinations. Is asking questions about when she will have her next Invega injection. Medication teaching done and patient encouraged to reach out to her physician for additional questions about her treatment plan. Interacting appropriately with staff and peers. Mood is pleasant. Affect is appropriate to circumstance.  A: Continue to monitor for safety and offer support. R: Safety maintained

## 2018-07-05 NOTE — Plan of Care (Signed)
  Problem: Education: Goal: Ability to make informed decisions regarding treatment will improve Outcome: Progressing   Problem: Coping: Goal: Coping ability will improve Outcome: Progressing   Problem: Medication: Goal: Compliance with prescribed medication regimen will improve Outcome: Progressing   Problem: Activity: Goal: Interest or engagement in leisure activities will improve Outcome: Progressing Goal: Imbalance in normal sleep/wake cycle will improve Outcome: Progressing   Problem: Coping: Goal: Coping ability will improve Outcome: Progressing Goal: Will verbalize feelings Outcome: Progressing   Problem: Self-Concept: Goal: Level of anxiety will decrease Outcome: Progressing

## 2018-07-05 NOTE — [facility] Group Notes (Signed)
LCSW Group Therapy Note 07/05/2018 1:15pm  Type of Therapy and Topic: Group Therapy: Feelings Around Returning Home & Establishing a Supportive Framework and Supporting Oneself When Supports Not Available  Participation Level: Active  Description of Group:  Patients first processed thoughts and feelings about upcoming discharge. These included fears of upcoming changes, lack of change, new living environments, judgements and expectations from others and overall stigma of mental health issues. The group then discussed the definition of a supportive framework, what that looks and feels like, and how do to discern it from an unhealthy non-supportive network. The group identified different types of supports as well as what to do when your family/friends are less than helpful or unavailable  Therapeutic Goals  1. Patient will identify one healthy supportive network that they can use at discharge. 2. Patient will identify one factor of a supportive framework and how to tell it from an unhealthy network. 3. Patient able to identify one coping skill to use when they do not have positive supports from others. 4. Patient will demonstrate ability to communicate their needs through discussion and/or role plays.  Summary of Patient Progress:  The patient reported she feels "good." Pt engaged during group session. As patients processed their anxiety about discharge and described healthy supports patient shared she is ready to be discharge. She stated, "I am a lot better than when I came here." She listed her family as her main support. Patients identified at least one self-care tool they were willing to use after discharge; listening to music, walking, talking to family.   Therapeutic Modalities Cognitive Behavioral Therapy Motivational Interviewing   Johnnye Sima, LCSW 07/05/2018 12:37 PM

## 2018-07-05 NOTE — Progress Notes (Signed)
[facility] MD Progress Note  07/05/2018 11:51 AM Theresa [name]  MRN:  [id number]  Subjective:    Theresa [name] has a history of schizoaffective disorder off medications for two months restarted on Invega that works well for her.   Overall, the patient appears to be doing fairly well in terms of not having any psychosis and mood has improved.  No current active or passive suicidal thoughts.  The patient denies any auditory or visual hallucinations.  No paranoid thoughts or delusions.  She slept over 7 hours last night.  Appetite is fairly good.  The patient has been attending groups on the unit and actively participating.  She talked a lot about triggers for substance use and her regret about using drugs in the past.  She is still resistant to entering a residential substance abuse treatment program but stated that she may consider going to NA after discharge.  She feels like her triggers are primarily, people places and things.  She denies any problems with her appetite so far.  Vital signs are stable.  Muscle stiffness overall has improved with the Cogentin  Past psychiatric history. Long history of schizoaffective disorder, since teenage years with multiple hospitalizations, medication trials and noncomplinace. No suicide attempts. There is a history of cannabis use.  Family psychiatric history. Grandfather with psychosis.  Social history. Denied diisability. Lives with her mother. +  Principal Problem: Schizoaffective disorder, bipolar type (HCC) Diagnosis: Principal Problem:   Schizoaffective disorder, bipolar type (HCC) Active Problems:   Tobacco use disorder   Amphetamine abuse (HCC)  Total Time spent with patient: 20 minutes   Past Medical History:  Past Medical History:  Diagnosis Date  . Abnormal vaginal Pap smear   . Anxiety   . Bipolar disorder (HCC)   . Depression   . Migraine without aura   . Polycystic ovary syndrome   . Psychosis (HCC)   . Schizophrenia (HCC)   .  STD (sexually transmitted disease) 10-14-15   chlamydia    History reviewed. No pertinent surgical history. Family History:  Family History  Problem Relation Age of Onset  . Hypertension Mother   . Heart disease Mother   . Cancer Mother   . Hypertension Other   . Depression Father   . Drug abuse Father   . Bipolar disorder Father   . Heart disease Maternal Grandmother   . Pancreatitis Maternal Grandfather   . Cancer Paternal Grandmother   . Cancer Paternal Grandfather    Family Psychiatric  History: none Social History:  Social History   Substance and Sexual Activity  Alcohol Use Yes  . Alcohol/week: 0.0 standard drinks     Social History   Substance and Sexual Activity  Drug Use Yes  . Types: Marijuana   Comment: pt denies    Social History   Socioeconomic History  . Marital status: Married    Spouse name: Not on file  . Number of children: Not on file  . Years of education: Not on file  . Highest education level: Not on file  Occupational History  . Not on file  Social Needs  . Financial resource strain: Not on file  . Food insecurity:    Worry: Not on file    Inability: Not on file  . Transportation needs:    Medical: Not on file    Non-medical: Not on file  Tobacco Use  . Smoking status: Current Every Day Smoker    Packs/day: 0.50    Types: Cigarettes  .  Smokeless tobacco: Never Used  Substance and Sexual Activity  . Alcohol use: Yes    Alcohol/week: 0.0 standard drinks  . Drug use: Yes    Types: Marijuana    Comment: pt denies  . Sexual activity: Not Currently    Partners: Male    Birth control/protection: None  Lifestyle  . Physical activity:    Days per week: Not on file    Minutes per session: Not on file  . Stress: Not on file  Relationships  . Social connections:    Talks on phone: Not on file    Gets together: Not on file    Attends religious service: Not on file    Active member of club or organization: Not on file    Attends  meetings of clubs or organizations: Not on file    Relationship status: Not on file  Other Topics Concern  . Not on file  Social History Narrative  . Not on file       Sleep: Good  Appetite:  Good  Current Medications: Current Facility-Administered Medications  Medication Dose Route Frequency Provider Last Rate Last Dose  . acetaminophen (TYLENOL) tablet 650 mg  650 mg Oral Q6H PRN Thomspon, Adela LankJacqueline, NP      . albuterol (PROVENTIL HFA;VENTOLIN HFA) 108 (90 Base) MCG/ACT inhaler 1-2 puff  1-2 puff Inhalation Q6H PRN Catalina Gravelhomspon, Jacqueline, NP      . alum & mag hydroxide-simeth (MAALOX/MYLANTA) 200-200-20 MG/5ML suspension 30 mL  30 mL Oral Q6H PRN Thomspon, Adela LankJacqueline, NP      . benztropine (COGENTIN) tablet 1 mg  1 mg Oral BID Pucilowski, Olgierd A, MD   1 mg at 07/05/18 0804  . guaiFENesin (MUCINEX) 12 hr tablet 600 mg  600 mg Oral BID Pucilowska, Jolanta B, MD   600 mg at 07/05/18 0805   And  . dextromethorphan (DELSYM) 30 MG/5ML liquid 30 mg  30 mg Oral BID Pucilowska, Jolanta B, MD   30 mg at 07/05/18 0805  . hydrOXYzine (ATARAX/VISTARIL) tablet 50 mg  50 mg Oral Q6H PRN Catalina Gravelhomspon, Jacqueline, NP   50 mg at 07/03/18 2136  . ibuprofen (ADVIL,MOTRIN) tablet 600 mg  600 mg Oral Q8H PRN Thomspon, Adela LankJacqueline, NP      . magnesium hydroxide (MILK OF MAGNESIA) suspension 30 mL  30 mL Oral Daily PRN Thomspon, Adela LankJacqueline, NP      . nicotine (NICODERM CQ - dosed in mg/24 hours) patch 14 mg  14 mg Transdermal Daily Pucilowska, Jolanta B, MD      . ondansetron (ZOFRAN) tablet 4 mg  4 mg Oral Q8H PRN Thomspon, Adela LankJacqueline, NP      . paliperidone (INVEGA SUSTENNA) injection 156 mg  156 mg Intramuscular Once Pucilowska, Jolanta B, MD      . paliperidone (INVEGA SUSTENNA) injection 234 mg  234 mg Intramuscular Q28 days Pucilowska, Jolanta B, MD   234 mg at 07/01/18 1721  . paliperidone (INVEGA) 24 hr tablet 6 mg  6 mg Oral Daily Pucilowska, Jolanta B, MD   6 mg at 07/05/18 0804  . traZODone  (DESYREL) tablet 100 mg  100 mg Oral QHS Pucilowska, Jolanta B, MD   100 mg at 07/04/18 2125    Lab Results:  No results found for this or any previous visit (from the past 48 hour(s)).  Blood Alcohol level:  Lab Results  Component [facility] Date   [facility]   [facility] <10 05/13/2018    Metabolic Disorder Labs: Lab Results  Component  Value Date   HGBA1C 5.2 07/01/2018   MPG 102.54 07/01/2018   MPG 99.67 10/19/2017   Lab Results  Component Value Date   PROLACTIN 73.5 (H) 10/19/2017   PROLACTIN 4.0 10/14/2015   Lab Results  Component Value Date   CHOL 147 07/01/2018   TRIG 54 07/01/2018   HDL 45 07/01/2018   CHOLHDL 3.3 07/01/2018   VLDL 11 07/01/2018   LDLCALC 91 07/01/2018   LDLCALC 86 10/19/2017    Physical Findings: AIMS: Facial and Oral Movements Muscles of Facial Expression: None, normal Lips and Perioral Area: None, normal Jaw: None, normal Tongue: None, normal,Extremity Movements Upper (arms, wrists, hands, fingers): None, normal Lower (legs, knees, ankles, toes): None, normal, Trunk Movements Neck, shoulders, hips: None, normal, Overall Severity Severity of abnormal movements (highest score from questions above): None, normal Incapacitation due to abnormal movements: None, normal Patient's awareness of abnormal movements (rate only patient's report): No Awareness, Dental Status Current problems with teeth and/or dentures?: No Does patient usually wear dentures?: No  CIWA:  CIWA-Ar Total: 5 COWS:  COWS Total Score: 3  Musculoskeletal: Strength & Muscle Tone: within normal limits Gait & Station: normal Patient leans: NA  Psychiatric Specialty Exam: Physical Exam  Nursing note and vitals reviewed. Psychiatric: Her speech is normal and behavior is normal. Thought content normal. Cognition and memory are normal.    Review of Systems  Constitutional: Negative.   HENT: Positive for congestion and sinus pain. Negative for hearing loss and tinnitus.         Sinus congestin x 3 days  Eyes: Negative.   Respiratory: Negative.   Cardiovascular: Negative.   Gastrointestinal: Negative.   Genitourinary: Negative.   Musculoskeletal: Negative.   Skin: Negative.   Neurological: Negative.   Endo/Heme/Allergies: Negative.     Blood pressure 134/64, pulse 67, temperature 98.7 F (37.1 C), temperature source Oral, resp. rate 18, height 5\' 4"  (1.626 m), weight 79.4 kg, SpO2 99 %.Body mass index is 30.04 kg/m.  General Appearance: Casual  Eye Contact:  Good  Speech:  Clear and coherent; regular rate  Volume:  Normal  Mood: "OK"  Affect: More interactive, euthymic  Thought Process:  Logical and goal directed  Orientation:  Full and oriented x 4  Thought Content:  Nio current psychotic symptoms  Suicidal Thoughts:  Denies any suicidal thought  Homicidal Thoughts:  Denies any homicidal thoughts  Memory:  Immediate;   Fair Recent;   Fair Remote;   Fair  Judgement:  Improving  Insight:  Improving  Psychomotor Activity:  Normal  Concentration:  Concentration: Fair and Attention Span: Fair  Recall:  FiservFair  Fund of Knowledge:  Fair  Language:  Fair  Akathisia:  No  Handed:  Right  AIMS (if indicated):     Assets:  Communication Skills Desire for Improvement Financial Resources/Insurance Housing Physical Health Resilience Social Support Transportation  ADL's:  Intact  Cognition:  WNL  Sleep:  Number of Hours: 7.15     Treatment Plan Summary: Daily contact with patient to assess and evaluate symptoms and progress in treatment and Medication management   Theresa [name] is a 23 year old female with a history od schizophrenia admitted floridly psychotic in the context of medication noncomplinace and substance abuse.  #Psychosis, improving -She will continue on Invega 6 mg p.o. daily -The patient received the Invega sustained injection of 234 mg on 07/01/18 with the next injection being scheduled for 07/29/18 -Second dose of [place] of  156 mg on 07/05/18 -For muscle  stiffness, added Cogentin 1 mg p.o. twice daily with good effect -Continue trazodone 100 mg p.o. nightly - For muscle stiffness, added Cogentin 1mg  po BID  #Substance abuse -She was abusing amphetamines prior to admission -She has attended NA groups on the unit but does not want residential substance abuse treatment -We will continue to encourage the patient and her meaningful recovery program at the time of discharge  #Smoking cessation -She was offered a nicotine patch   #Sinus Congestion: -She was started on r Mucinex Sinus  #Labs -Total cholesterol was 147, hemoglobin A1c was 5.2 and TSH was within normal limits -EKG reviewed, NSR with Tc 460 -pregnancy testis negative  #Disposition -The patient will be discharged home with family -Psychotropic medication management follow-up appointment will be with [facility] in [place] Rump[place], MD 07/05/2018, 11:51 AM

## 2018-07-05 NOTE — Progress Notes (Signed)
Patient presents with sad affect but brightens on approach. Denies any SI, HI,AVH. Patient asking how often she will have to get her invega injection and when her next one is due. Patient is pleasant and cooperative with care. Visible in milieu.  Encouragement and support offered. Safety checks maintained. Pt receptive and remains safe on unit with q 15 min checks.

## 2018-07-05 NOTE — Plan of Care (Signed)
  Problem: Education: Goal: Ability to make informed decisions regarding treatment will improve 07/05/2018 1445 by Weldon Picking, RN Outcome: Progressing 07/05/2018 1222 by Weldon Picking, RN Outcome: Progressing   Problem: Coping: Goal: Coping ability will improve 07/05/2018 1445 by Weldon Picking, RN Outcome: Progressing 07/05/2018 1222 by Weldon Picking, RN Outcome: Progressing   Problem: Medication: Goal: Compliance with prescribed medication regimen will improve 07/05/2018 1445 by Weldon Picking, RN Outcome: Progressing 07/05/2018 1222 by Weldon Picking, RN Outcome: Progressing   Problem: Activity: Goal: Interest or engagement in leisure activities will improve 07/05/2018 1445 by Weldon Picking, RN Outcome: Progressing 07/05/2018 1222 by Weldon Picking, RN Outcome: Progressing Goal: Imbalance in normal sleep/wake cycle will improve 07/05/2018 1445 by Weldon Picking, RN Outcome: Progressing 07/05/2018 1222 by Weldon Picking, RN Outcome: Progressing   Problem: Coping: Goal: Coping ability will improve 07/05/2018 1445 by Weldon Picking, RN Outcome: Progressing 07/05/2018 1222 by Weldon Picking, RN Outcome: Progressing Goal: Will verbalize feelings 07/05/2018 1445 by Weldon Picking, RN Outcome: Progressing 07/05/2018 1222 by Weldon Picking, RN Outcome: Progressing   Problem: Self-Concept: Goal: Level of anxiety will decrease Outcome: Progressing

## 2018-07-05 NOTE — Plan of Care (Signed)
D: Patient reports her Trazodone makes her too sedated and asked to only receive half of her dose and refused the other half, but then asked for prn Vistaril and explained to pt that Vistaril would make her sedated as well. Denies SI, HI and AV hallucinations. A: Continue to monitor and offer support

## 2018-07-06 MED ORDER — TRAZODONE HCL 100 MG PO TABS: 100.0000 mg | ORAL_TABLET | Freq: Every day | ORAL | 1 refills | Status: DC

## 2018-07-06 MED ORDER — BENZTROPINE MESYLATE 1 MG PO TABS: 1.0000 mg | ORAL_TABLET | Freq: Two times a day (BID) | ORAL | 1 refills | Status: DC

## 2018-07-06 MED ORDER — PALIPERIDONE PALMITATE ER 234 MG/1.5ML IM SUSY: 234.0000 mg | PREFILLED_SYRINGE | INTRAMUSCULAR | 1 refills | Status: DC

## 2018-07-06 NOTE — Progress Notes (Signed)
Nursing discharge note: Patient discharged home per MD order.  Patient received all personal belongings from unit and locker.  Reviewed AVS/transition record with patient and she indicates understanding.  Patient will follow up with [facility] [place]. Patient denies any thoughts of self harm.  She left ambulatory with mother.  Patient left with prescriptions.

## 2018-07-06 NOTE — Plan of Care (Signed)
  Problem: Education: Goal: Ability to make informed decisions regarding treatment will improve Outcome: Completed/Met   Problem: Coping: Goal: Coping ability will improve Outcome: Completed/Met   Problem: Medication: Goal: Compliance with prescribed medication regimen will improve Outcome: Completed/Met   Problem: Activity: Goal: Interest or engagement in leisure activities will improve Outcome: Completed/Met Goal: Imbalance in normal sleep/wake cycle will improve Outcome: Completed/Met   Problem: Coping: Goal: Coping ability will improve Outcome: Completed/Met Goal: Will verbalize feelings Outcome: Completed/Met   Problem: Self-Concept: Goal: Ability to identify factors that promote anxiety will improve Outcome: Completed/Met Goal: Level of anxiety will decrease Outcome: Completed/Met Goal: Ability to modify response to factors that promote anxiety will improve Outcome: Completed/Met   Problem: Health Behavior/Discharge Planning: Goal: Identification of resources available to assist in meeting health care needs will improve Outcome: Completed/Met   Problem: Self-Concept: Goal: Will verbalize positive feelings about self Outcome: Completed/Met

## 2018-07-06 NOTE — Progress Notes (Signed)
  [facility] Adult Case Management Discharge Plan :  Will you be returning to the same living situation after discharge:  Yes,  lives with parent At discharge, do you have transportation home?: No. [name] will assist with transportation Do you have the ability to pay for your medications: Yes,  insurance  Release of information consent forms completed and in the chart;  Patient's signature needed at discharge.  Patient to Follow up at: Follow-up Information    [facility] [employer]. Go to.   Specialty:  Professional Counselor Why:  Please go to the walk in clinic on Monday-Friday at 830am-1200pm or 1pm-230pm. Please bring photo ID, medication and insurance card. Thank you. Contact information: [facility] [place] [address] [place] [address] [phone]           Next level of care provider has access to [facility] Link:no  Safety Planning and Suicide Prevention discussed: Yes,  with pt: pt declined family contact  Have you used any form of tobacco in the last 30 days? (Cigarettes, Smokeless Tobacco, Cigars, and/or Pipes): No  Has patient been referred to the Quitline?: N/A patient is not a smoker  Patient has been referred for addiction treatment: N/A  Suzan Slick, LCSW 07/06/2018, 9:39 AM

## 2018-07-06 NOTE — Discharge Summary (Signed)
Physician Discharge Summary Note  Patient:  Theresa [name] is an 23 y.o., female MRN:  [id number] DOB:  Jan 03, 1996 Patient phone:  [phone] (home)  Patient address:   [address] [place] [id number],  Total Time spent with patient: 20 minutes plus 15 min 0n care coordination and documentation.  Date of Admission:  06/30/2018 Date of Discharge: 07/06/2018  Reason for Admission:  Psychotic break.  History of Present Illness:   Identifying data. Theresa [name] is a 23 year old female with a history of schizoaffective disorder.  Chief complaint. Unable to state. Cries.  History of present illness. Information was obtained from the patient and the chart. The patient was [facility] ER for a psychotic break. She has been off medications since last spring and eventually succumbed back into psychosis. Per mother, for the past few weeks tha patient left the house and was ondrug binge, including amphetamines. When returned home, she "was ready" for treatment. The patient herself contributes very little. She is crying quietly all through the interview and treatment team meeting. She does not want to take medications because they make her "stiff". Unable to chose betwin Abilify maintena and Invega sustenna shots. She usually does well on Zyprexa but compliance is a problem.  Patient herself denies any symptoms of depression, anxiety or psychosis. She is unable to discuss drug use.  Past psychiatric history. Long history of schizoaffective disorder, since teenage years with multiple hospitalizations, medication trials and noncomplinace. No suicide attempts. There is a history of cannabis use.  Family psychiatric history. Grandfather with psychosis.  Social history. Has disability and Medicaid now. Lives with her mother.   Principal Problem: Schizoaffective disorder, bipolar type [facility](HCC) Discharge Diagnoses: Principal Problem:   Schizoaffective disorder, bipolar  type (HCC) Active Problems:   Tobacco use disorder   Amphetamine abuse (HCC)    Past Medical History:  Past Medical History:  Diagnosis Date  . Abnormal vaginal Pap smear   . Anxiety   . Bipolar disorder (HCC)   . Depression   . Migraine without aura   . Polycystic ovary syndrome   . Psychosis (HCC)   . Schizophrenia (HCC)   . STD (sexually transmitted disease) 10-14-15   chlamydia    History reviewed. No pertinent surgical history. Family History:  Family History  Problem Relation Age of Onset  . Hypertension Mother   . Heart disease Mother   . Cancer Mother   . Hypertension Other   . Depression Father   . Drug abuse Father   . Bipolar disorder Father   . Heart disease Maternal Grandmother   . Pancreatitis Maternal Grandfather   . Cancer Paternal Grandmother   . Cancer Paternal Grandfather    Social History:  Social History   Substance and Sexual Activity  Alcohol Use Yes  . Alcohol/week: 0.0 standard drinks     Social History   Substance and Sexual Activity  Drug Use Yes  . Types: Marijuana   Comment: pt denies    Social History   Socioeconomic History  . Marital status: Married    Spouse name: Not on file  . Number of children: Not on file  . Years of education: Not on file  . Highest education level: Not on file  Occupational History  . Not on file  Social Needs  . Financial resource strain: Not on file  . Food insecurity:    Worry: Not on file    Inability: Not on file  . Transportation needs:  Medical: Not on file    Non-medical: Not on file  Tobacco Use  . Smoking status: Current Every Day Smoker    Packs/day: 0.50    Types: Cigarettes  . Smokeless tobacco: Never Used  Substance and Sexual Activity  . Alcohol use: Yes    Alcohol/week: 0.0 standard drinks  . Drug use: Yes    Types: Marijuana    Comment: pt denies  . Sexual activity: Not Currently    Partners: Male    Birth control/protection: None  Lifestyle  . Physical  activity:    Days per week: Not on file    Minutes per session: Not on file  . Stress: Not on file  Relationships  . Social connections:    Talks on phone: Not on file    Gets together: Not on file    Attends religious service: Not on file    Active member of club or organization: Not on file    Attends meetings of clubs or organizations: Not on file    Relationship status: Not on file  Other Topics Concern  . Not on file  Social History Narrative  . Not on file    Hospital Course:    Theresa [name] is a 23 year old female with a history od schizophrenia admitted floridly psychotic in the context of medication noncomplinace and substance abuse. She accepted medications, including long acting Invega sustenna, with improvement. She tolerates medications well. At the time of discharge, the patient is no longer suicida, homicidal orpsychotic. She is forward thinking and optimistic about the future.  #Psychosis, improved -Invega sustenna 234 mg every 28 days started on 07/01/2018, next dose on 07/29/2018 -Cogentin 1 mg BID -Trazodone 100 mg nightly  #Substance abuse -abusing amphetamines -patient not ready to address problems  #Smoking cessation -nicotine patch available  #Labs -lipid panel, TSH, A1Care normal -EKGreviewed, NSR with Tc 460 -pregnancy testis negative  #Disposition -discharge to home with family -follow up with [facility] in [place]  Physical Findings: AIMS: Facial and Oral Movements Muscles of Facial Expression: None, normal Lips and Perioral Area: None, normal Jaw: None, normal Tongue: None, normal,Extremity Movements Upper (arms, wrists, hands, fingers): None, normal Lower (legs, knees, ankles, toes): None, normal, Trunk Movements Neck, shoulders, hips: None, normal, Overall Severity Severity of abnormal movements (highest score from questions above): None, normal Incapacitation due to abnormal movements: None, normal Patient's awareness of abnormal  movements (rate only patient's report): No Awareness, Dental Status Current problems with teeth and/or dentures?: No Does patient usually wear dentures?: No  CIWA:  CIWA-Ar Total: 5 COWS:  COWS Total Score: 3  Musculoskeletal: Strength & Muscle Tone: within normal limits Gait & Station: normal Patient leans: N/A  Psychiatric Specialty Exam: Physical Exam  Nursing note and vitals reviewed. Psychiatric: She has a normal mood and affect. Her speech is normal and behavior is normal. Thought content normal. Cognition and memory are normal. She expresses impulsivity.    Review of Systems  Neurological: Negative.   Psychiatric/Behavioral: Positive for substance abuse.  All other systems reviewed and are negative.   Blood pressure 123/69, pulse 73, temperature 97.9 F (36.6 C), temperature source Oral, resp. rate 18, height 5\' 4"  (1.626 m), weight 79.4 kg, SpO2 99 %.Body mass index is 30.04 kg/m.  General Appearance: Casual  Eye Contact:  Good  Speech:  Clear and Coherent  Volume:  Normal  Mood:  Euthymic  Affect:  Appropriate  Thought Process:  Goal Directed and Descriptions of Associations: Intact  Orientation:  Full (  Time, Place, and Person)  Thought Content:  WDL  Suicidal Thoughts:  No  Homicidal Thoughts:  No  Memory:  Immediate;   Fair Recent;   Fair Remote;   Fair  Judgement:  Poor  Insight:  Shallow  Psychomotor Activity:  Normal  Concentration:  Concentration: Fair and Attention Span: Fair  Recall:  [employer]Fair  Fund of Knowledge:  Fair  Language:  Fair  Akathisia:  No  Handed:  Right  AIMS (if indicated):     Assets:  Communication Skills Desire for Improvement Financial Resources/Insurance Housing Physical Health Resilience Social Support Transportation Vocational/Educational  ADL's:  Intact  Cognition:  WNL  Sleep:  Number of Hours: 6     Have you used any form of tobacco in the last 30 days? (Cigarettes, Smokeless Tobacco, Cigars, and/or Pipes): No  Has  this patient used any form of tobacco in the last 30 days? (Cigarettes, Smokeless Tobacco, Cigars, and/or Pipes) Yes, Yes, A prescription for an FDA-approved tobacco cessation medication was offered at discharge and the patient refused  Blood Alcohol level:  Lab Results  Component Value Date   [facility] 06/[facility]   ETH <10 05/13/2018    Metabolic Disorder Labs:  Lab Results  Component Value Date   HGBA1C 5.2 07/01/2018   MPG 102.54 07/01/2018   MPG 99.67 10/19/2017   Lab Results  Component Value Date   PROLACTIN 73.5 (H) 10/19/2017   PROLACTIN 4.0 10/14/2015   Lab Results  Component Value Date   CHOL 147 07/01/2018   TRIG 54 07/01/2018   HDL 45 07/01/2018   CHOLHDL 3.3 07/01/2018   VLDL 11 07/01/2018   LDLCALC 91 07/01/2018   LDLCALC 86 10/19/2017    See Psychiatric Specialty Exam and Suicide Risk Assessment completed by Attending Physician prior to discharge.  Discharge destination:  Home  Is patient on multiple antipsychotic therapies at discharge:  No   Has Patient had three or more failed trials of antipsychotic monotherapy by history:  No  Recommended Plan for Multiple Antipsychotic Therapies: NA  Discharge Instructions    Diet - low sodium heart healthy   Complete by:  As directed    Increase activity slowly   Complete by:  As directed      Allergies as of 07/06/2018   No Known Allergies     Medication List    STOP taking these medications   acetaminophen 500 MG tablet Commonly known as:  TYLENOL   albuterol 108 (90 Base) MCG/ACT inhaler Commonly known as:  PROVENTIL HFA;VENTOLIN HFA   BC HEADACHE PO   hydrOXYzine 50 MG tablet Commonly known as:  ATARAX/VISTARIL   ibuprofen 200 MG tablet Commonly known as:  ADVIL,MOTRIN     TAKE these medications     Indication  benztropine 1 MG tablet Commonly known as:  COGENTIN Take 1 tablet (1 mg total) by mouth 2 (two) times daily.  Indication:  Extrapyramidal Reaction caused by Medications    paliperidone 234 MG/1.5ML Susy injection Commonly known as:  INVEGA SUSTENNA Inject 234 mg into the muscle every 28 (twenty-eight) days. Next injection on 06/29/2018 Start taking on:  July 29, 2018 What changed:    medication strength  how much to take  additional instructions  Indication:  Schizophrenia   traZODone 100 MG tablet Commonly known as:  DESYREL Take 1 tablet (100 mg total) by mouth at bedtime. What changed:    medication strength  how much to take  when to take this  reasons to take  this  Indication:  Trouble Sleeping      Follow-up Information    [facility] [employer]. Go to.   Specialty:  Professional Counselor Why:  Please go to the walk in clinic on Monday-Friday at 830am-1200pm or 1pm-230pm. Please bring photo ID, medication and insurance card. Thank you. Contact information: [facility] [address] [place] [address] [phone]           Follow-up recommendations:  Activity:  as tolerated Diet:  low sodium heart healthy Other:  keep follow up appointments  Comments:    Signed: Kristine Linea, MD 07/06/2018, 9:42 AM

## 2018-07-06 NOTE — BHH Suicide Risk Assessment (Signed)
[facility] Discharge Suicide Risk Assessment   Principal Problem: Schizoaffective disorder, bipolar type [facility]) Discharge Diagnoses: Principal Problem:   Schizoaffective disorder, bipolar type (HCC) Active Problems:   Tobacco use disorder   Amphetamine abuse (HCC)   Total Time spent with patient: 20 minutes  Musculoskeletal: Strength & Muscle Tone: within normal limits Gait & Station: normal Patient leans: N/A  Psychiatric Specialty Exam: Review of Systems  Neurological: Negative.   Psychiatric/Behavioral: Negative.   All other systems reviewed and are negative.   Blood pressure 123/69, pulse 73, temperature 97.9 F (36.6 C), temperature source Oral, resp. rate 18, height 5\' 4"  (1.626 m), weight 79.4 kg, SpO2 99 %.Body mass index is 30.04 kg/m.  General Appearance: Casual  Eye Contact::  Good  Speech:  Clear and Coherent409  Volume:  Normal  Mood:  Euthymic  Affect:  Blunt  Thought Process:  Goal Directed and Descriptions of Associations: Intact  Orientation:  Full (Time, Place, and Person)  Thought Content:  WDL  Suicidal Thoughts:  No  Homicidal Thoughts:  No  Memory:  Immediate;   Fair Recent;   Fair Remote;   Fair  Judgement:  Poor  Insight:  Lacking  Psychomotor Activity:  Normal  Concentration:  Fair  Recall:  [employer] of Knowledge:Fair  Language: Fair  Akathisia:  No  Handed:  Right  AIMS (if indicated):     Assets:  Communication Skills Desire for Improvement Financial Resources/Insurance Housing Physical Health Resilience Social Support Talents/Skills Transportation Vocational/Educational  Sleep:  Number of Hours: 6  Cognition: WNL  ADL's:  Intact   Mental Status Per Nursing Assessment::   On Admission:  NA  Demographic Factors:  Adolescent or young adult and Caucasian  Loss Factors: Loss of significant relationship  Historical Factors: Family history of mental illness or substance abuse and Impulsivity  Risk Reduction Factors:   Sense  of responsibility to family, Living with another person, especially a relative, Positive social support and Positive therapeutic relationship  Continued Clinical Symptoms:  Alcohol/Substance Abuse/Dependencies Schizophrenia:   Depressive state Less than 44 years old  Cognitive Features That Contribute To Risk:  None    Suicide Risk:  Minimal: No identifiable suicidal ideation.  Patients presenting with no risk factors but with morbid ruminations; may be classified as minimal risk based on the severity of the depressive symptoms  Follow-up Information    [facility] [employer]. Go to.   Specialty:  Professional Counselor Why:  Please go to the walk in clinic on Monday-Friday at 830am-1200pm or 1pm-230pm. Please bring photo ID, medication and insurance card. Thank you. Contact information: [facility] [address] [place] [address] [phone]           Plan Of Care/Follow-up recommendations:  Activity:  as tolerated  Diet:  low sodium heart healthy Other:  keep follow up appointments  Kristine Linea, MD 07/06/2018, 9:26 AM

## 2018-08-03 ENCOUNTER — Other Ambulatory Visit: Payer: Self-pay

## 2018-08-03 ENCOUNTER — Encounter: Payer: Self-pay | Admitting: Obstetrics and Gynecology

## 2018-08-03 ENCOUNTER — Telehealth: Payer: Self-pay | Admitting: Obstetrics and Gynecology

## 2018-08-03 ENCOUNTER — Ambulatory Visit: Payer: Medicaid Other | Admitting: Obstetrics and Gynecology

## 2018-08-03 ENCOUNTER — Other Ambulatory Visit (HOSPITAL_COMMUNITY)
Admission: RE | Admit: 2018-08-03 | Discharge: 2018-08-03 | Disposition: A | Discharge status: Home / Self Care | Payer: Medicaid Other | Source: Ambulatory Visit | Attending: Obstetrics and Gynecology | Admitting: Obstetrics and Gynecology

## 2018-08-03 VITALS — BP 124/68 | HR 76 | Ht 63.5 in | Wt 205.0 lb

## 2018-08-03 DIAGNOSIS — N926 Irregular menstruation, unspecified: Secondary | ICD-10-CM

## 2018-08-03 DIAGNOSIS — Z01419 Encounter for gynecological examination (general) (routine) without abnormal findings: Secondary | ICD-10-CM | POA: Diagnosis not present

## 2018-08-03 DIAGNOSIS — Z0000 Encounter for general adult medical examination without abnormal findings: Secondary | ICD-10-CM

## 2018-08-03 DIAGNOSIS — Z7689 Persons encountering health services in other specified circumstances: Secondary | ICD-10-CM

## 2018-08-03 DIAGNOSIS — Z124 Encounter for screening for malignant neoplasm of cervix: Secondary | ICD-10-CM | POA: Insufficient documentation

## 2018-08-03 DIAGNOSIS — Z113 Encounter for screening for infections with a predominantly sexual mode of transmission: Secondary | ICD-10-CM | POA: Diagnosis not present

## 2018-08-03 LAB — POCT URINE PREGNANCY: Preg Test, Ur: NEGATIVE

## 2018-08-03 NOTE — Telephone Encounter (Signed)
I don't think she needs an ultrasound at this time. If she is cycling monthly that suggests that she is ovulating. If she wants to get pregnant, then she doesn't want to go on birth control. You can send her BBT charts to determine if she is ovulating.  Please refer her to Dr Natalia Leatherwood office.

## 2018-08-03 NOTE — Progress Notes (Signed)
23 y.o. G0P0000 Married White or Caucasian Not Hispanic or Latino female here for annual exam.   The last 4 months her cycle has been monthly, prior to that she had irregular to no cycles. She is interested in contraception. She c/o worsening hair growth on her face. She c/o stabbing pain in the LLQ of her abdomen/pelvis prior to her cycles. She didn't notice it with her last cycle. Sexually active, same partner x 3 years. Intermittent deep dyspareunia, positional. No contraception. She is okay if she gets pregnant. She has started a prenatal vitamin Period Cycle (Days): 28 Period Duration (Days): 5 days Period Pattern: Regular Menstrual Flow: Moderate, Light Menstrual Control: Thin pad, Tampon Menstrual Control Change Freq (Hours): changes pad/tampon every 2 hours Dysmenorrhea: None  Patient's last menstrual period was 07/16/2018 (exact date).          Sexually active: Yes.    The current method of family planning is none.    Exercising: No.  The patient does not participate in regular exercise at present. Smoker:  Yes, marijuana 2 x a day, 1/2 PPD of cigarettes.   Health Maintenance: Pap:  10-14-15 WNL +chlamydia History of abnormal Pap:  Yes- 2014 ASCUS + for HPV in the past TDaP:  Up to date per patient Gardasil: completed all 3   reports that she has been smoking cigarettes. She has been smoking about 0.50 packs per day. She has never used smokeless tobacco. She reports current alcohol use. She reports current drug use. Frequency: 2.00 times per week. Drug: Marijuana. No longer drinking, never a big drinker. She is planning to look for work, getting her psychiatric medication adjusted. Living with her mother.   Past Medical History:  Diagnosis Date  . Abnormal vaginal Pap smear   . Anxiety   . Bipolar disorder (HCC)   . Depression   . Migraine without aura   . Polycystic ovary syndrome   . Psychosis (HCC)   . Schizophrenia (HCC)   . STD (sexually transmitted disease) 10-14-15    chlamydia     History reviewed. No pertinent surgical history.  Current Outpatient Medications  Medication Sig Dispense Refill  . benztropine (COGENTIN) 1 MG tablet Take 1 tablet (1 mg total) by mouth 2 (two) times daily. 60 tablet 1  . buprenorphine-naloxone (SUBOXONE) 8-2 mg SUBL SL tablet Place 0.5 tablets under the tongue 2 (two) times daily.    . paliperidone (INVEGA SUSTENNA) 234 MG/1.5ML SUSY injection Inject 234 mg into the muscle every 28 (twenty-eight) days. Next injection on 06/29/2018 1.8 mL 1  . traZODone (DESYREL) 100 MG tablet Take 1 tablet (100 mg total) by mouth at bedtime. 30 tablet 1   No current facility-administered medications for this visit.     Family History  Problem Relation Age of Onset  . Hypertension Mother   . Heart disease Mother   . Cancer Mother   . Hypertension Other   . Depression Father   . Drug abuse Father   . Bipolar disorder Father   . Heart disease Maternal Grandmother   . Pancreatitis Maternal Grandfather   . Cancer Paternal Grandmother   . Cancer Paternal Grandfather   Mom with a h/o melanoma.   Review of Systems  Constitutional:       Weight gain  HENT: Negative.   Eyes: Negative.   Cardiovascular: Negative.   Gastrointestinal: Positive for constipation and nausea.  Endocrine:       Craving sweets Excessive thirst  Genitourinary: Positive for frequency.  Loss of urine spontaneously  Musculoskeletal: Negative.   Skin:       Excessive hair growth  Allergic/Immunologic: Negative.   Neurological: Negative.   Psychiatric/Behavioral: Negative.     Exam:   BP 124/68 (BP Location: Right Arm, Patient Position: Sitting, Cuff Size: Large)   Pulse 76   Ht 5' 3.5" (1.613 m)   Wt 205 lb (93 kg)   LMP 07/16/2018 (Exact Date)   BMI 35.74 kg/m   Weight change: @WEIGHTCHANGE @ Height:   Height: 5' 3.5" (161.3 cm)  Ht Readings from Last 3 Encounters:  08/03/18 5' 3.5" (1.613 m)  06/29/18 5' 3.5" (1.613 m)  05/13/18 5\' 3"  (1.6  m)    General appearance: alert, cooperative and appears stated age Head: Normocephalic, without obvious abnormality, atraumatic Neck: no adenopathy, supple, symmetrical, trachea midline and thyroid normal to inspection and palpation Lungs: clear to auscultation bilaterally Cardiovascular: regular rate and rhythm Breasts: normal appearance, no masses or tenderness Abdomen: soft, non-tender; non distended,  no masses,  no organomegaly Extremities: extremities normal, atraumatic, no cyanosis or edema Skin: Skin color, texture, turgor normal. No rashes or lesions Lymph nodes: Cervical, supraclavicular, and axillary nodes normal. No abnormal inguinal nodes palpated Neurologic: Grossly normal   Pelvic: External genitalia:  no lesions              Urethra:  normal appearing urethra with no masses, tenderness or lesions              Bartholins and Skenes: normal                 Vagina: normal appearing vagina with normal color and discharge, no lesions. + blood              Cervix: no lesions               Bimanual Exam:  Uterus:  normal size, contour, position, consistency, mobility, non-tender and retroverted              Adnexa: no mass, fullness, tenderness               Rectovaginal: Confirms               Anus:  normal sphincter tone, no lesions  Chaperone was present for exam.  A:  Well Woman with normal exam  H/O irregular menses, current cycle started today, early. The last 4 cycles have been monthly  P:   Pap   STD testing  Other labs UTD  Not using contraception, would be happy to get pregnant. On PNV  She will discussed her psychiatric meds and pregancy with her psychiatrist  Discussed breast self exam  Discussed calcium and vit D intake  UPT today, negative  Calendar cycles  Discussed the importance of regular cycles. She is to call if she is skipping cycles

## 2018-08-03 NOTE — Patient Instructions (Signed)
EXERCISE AND DIET:  We recommended that you start or continue a regular exercise program for good health. Regular exercise means any activity that makes your heart beat faster and makes you sweat.  We recommend exercising at least 30 minutes per day at least 3 days a week, preferably 4 or 5.  We also recommend a diet low in fat and sugar.  Inactivity, poor dietary choices and obesity can cause diabetes, heart attack, stroke, and kidney damage, among others.    ALCOHOL AND SMOKING:  Women should limit their alcohol intake to no more than 7 drinks/beers/glasses of wine (combined, not each!) per week. Moderation of alcohol intake to this level decreases your risk of breast cancer and liver damage. And of course, no recreational drugs are part of a healthy lifestyle.  And absolutely no smoking or even second hand smoke. Most people know smoking can cause heart and lung diseases, but did you know it also contributes to weakening of your bones? Aging of your skin?  Yellowing of your teeth and nails?  CALCIUM AND VITAMIN D:  Adequate intake of calcium and Vitamin D are recommended.  The recommendations for exact amounts of these supplements seem to change often, but generally speaking 1,000 mg of calcium (between diet and supplement) and 800 units of Vitamin D per day seems prudent. Certain women may benefit from higher intake of Vitamin D.  If you are among these women, your doctor will have told you during your visit.    PAP SMEARS:  Pap smears, to check for cervical cancer or precancers,  have traditionally been done yearly, although recent scientific advances have shown that most women can have pap smears less often.  However, every woman still should have a physical exam from her gynecologist every year. It will include a breast check, inspection of the vulva and vagina to check for abnormal growths or skin changes, a visual exam of the cervix, and then an exam to evaluate the size and shape of the uterus and  ovaries.  And after 25 years of age, a rectal exam is indicated to check for rectal cancers. We will also provide age appropriate advice regarding health maintenance, like when you should have certain vaccines, screening for sexually transmitted diseases, bone density testing, colonoscopy, mammograms, etc.   MAMMOGRAMS:  All women over 40 years old should have a yearly mammogram. Many facilities now offer a "3D" mammogram, which may cost around $50 extra out of pocket. If possible,  we recommend you accept the option to have the 3D mammogram performed.  It both reduces the number of women who will be called back for extra views which then turn out to be normal, and it is better than the routine mammogram at detecting truly abnormal areas.    COLON CANCER SCREENING: Now recommend starting at age 45. At this time colonoscopy is not covered for routine screening until 50. There are take home tests that can be done between 45-49.   COLONOSCOPY:  Colonoscopy to screen for colon cancer is recommended for all women at age 50.  We know, you hate the idea of the prep.  We agree, BUT, having colon cancer and not knowing it is worse!!  Colon cancer so often starts as a polyp that can be seen and removed at colonscopy, which can quite literally save your life!  And if your first colonoscopy is normal and you have no family history of colon cancer, most women don't have to have it again for   10 years.  Once every ten years, you can do something that may end up saving your life, right?  We will be happy to help you get it scheduled when you are ready.  Be sure to check your insurance coverage so you understand how much it will cost.  It may be covered as a preventative service at no cost, but you should check your particular policy.      Breast Self-Awareness Breast self-awareness means being familiar with how your breasts look and feel. It involves checking your breasts regularly and reporting any changes to your  health care provider. Practicing breast self-awareness is important. A change in your breasts can be a sign of a serious medical problem. Being familiar with how your breasts look and feel allows you to find any problems early, when treatment is more likely to be successful. All women should practice breast self-awareness, including women who have had breast implants. How to do a breast self-exam One way to learn what is normal for your breasts and whether your breasts are changing is to do a breast self-exam. To do a breast self-exam: Look for Changes  1. Remove all the clothing above your waist. 2. Stand in front of a mirror in a room with good lighting. 3. Put your hands on your hips. 4. Push your hands firmly downward. 5. Compare your breasts in the mirror. Look for differences between them (asymmetry), such as: ? Differences in shape. ? Differences in size. ? Puckers, dips, and bumps in one breast and not the other. 6. Look at each breast for changes in your skin, such as: ? Redness. ? Scaly areas. 7. Look for changes in your nipples, such as: ? Discharge. ? Bleeding. ? Dimpling. ? Redness. ? A change in position. Feel for Changes Carefully feel your breasts for lumps and changes. It is best to do this while lying on your back on the floor and again while sitting or standing in the shower or tub with soapy water on your skin. Feel each breast in the following way:  Place the arm on the side of the breast you are examining above your head.  Feel your breast with the other hand.  Start in the nipple area and make  inch (2 cm) overlapping circles to feel your breast. Use the pads of your three middle fingers to do this. Apply light pressure, then medium pressure, then firm pressure. The light pressure will allow you to feel the tissue closest to the skin. The medium pressure will allow you to feel the tissue that is a little deeper. The firm pressure will allow you to feel the tissue  close to the ribs.  Continue the overlapping circles, moving downward over the breast until you feel your ribs below your breast.  Move one finger-width toward the center of the body. Continue to use the  inch (2 cm) overlapping circles to feel your breast as you move slowly up toward your collarbone.  Continue the up and down exam using all three pressures until you reach your armpit.  Write Down What You Find  Write down what is normal for each breast and any changes that you find. Keep a written record with breast changes or normal findings for each breast. By writing this information down, you do not need to depend only on memory for size, tenderness, or location. Write down where you are in your menstrual cycle, if you are still menstruating. If you are having trouble noticing differences   in your breasts, do not get discouraged. With time you will become more familiar with the variations in your breasts and more comfortable with the exam. How often should I examine my breasts? Examine your breasts every month. If you are breastfeeding, the best time to examine your breasts is after a feeding or after using a breast pump. If you menstruate, the best time to examine your breasts is 5-7 days after your period is over. During your period, your breasts are lumpier, and it may be more difficult to notice changes. When should I see my health care provider? See your health care provider if you notice:  A change in shape or size of your breasts or nipples.  A change in the skin of your breast or nipples, such as a reddened or scaly area.  Unusual discharge from your nipples.  A lump or thick area that was not there before.  Pain in your breasts.  Anything that concerns you.  

## 2018-08-03 NOTE — Telephone Encounter (Signed)
Patient was seen today and has questions regarding a possible ultrasound. Marland Kitchen

## 2018-08-03 NOTE — Telephone Encounter (Signed)
Left message to call Theresa [name] at [phone]. 

## 2018-08-03 NOTE — Telephone Encounter (Signed)
Call to patient. Patient states she has a history of PCOS and wondered if Dr. Oscar La was going to do an ultrasound to "check my ovaries out and see if the cysts are still like they were." Patient states she has been trying for pregnancy and unsure if she is "ovulating right." RN advised would need to review with Dr. Oscar La and return call. Patient agreeable. States she also told Dr. Oscar La she is interested in birth control if that will regulate her hormones.   Patient also asking for Dr. Salli Quarry recommendation for PCP?  Routing to provider for review.

## 2018-08-04 LAB — HEP, RPR, HIV PANEL
HIV Screen 4th Generation wRfx: NONREACTIVE
Hepatitis B Surface Ag: NEGATIVE
RPR Ser Ql: NONREACTIVE

## 2018-08-04 LAB — HEPATITIS C ANTIBODY: Hep C Virus Ab: <0.1 s/co ratio (ref 0.0–0.9)

## 2018-08-04 NOTE — Telephone Encounter (Signed)
Patient returning call.

## 2018-08-04 NOTE — Telephone Encounter (Signed)
Spoke with patient. Advised of message as seen below from Dr.Jertson. Patient verbalizes understanding. Patient is wanting to try for pregnancy. Understands birth control will prevent pregnancy. BBT mailed to patient's home address on file. Referral placed to Dr.Wallace's office. Advised of results as seen below. Aware cervical cultures are pending. Patient verbalizes understanding.  Notes recorded by Romualdo Bolk, MD on 08/04/2018 at 1:10 PM EDT Please advise the patient of normal results. Cervical cultures are pending  Encounter closed.

## 2018-08-06 LAB — CYTOLOGY - PAP
Chlamydia: NEGATIVE
Diagnosis: NEGATIVE
Neisseria Gonorrhea: NEGATIVE
Trichomonas: NEGATIVE

## 2018-09-11 ENCOUNTER — Encounter (HOSPITAL_COMMUNITY): Payer: Self-pay | Admitting: Emergency Medicine

## 2018-09-11 ENCOUNTER — Emergency Department (HOSPITAL_COMMUNITY)
Admission: EM | Admit: 2018-09-11 | Discharge: 2018-09-11 | Disposition: A | Discharge status: Home / Self Care | Payer: Medicaid Other | Attending: Emergency Medicine | Admitting: Emergency Medicine

## 2018-09-11 ENCOUNTER — Other Ambulatory Visit: Payer: Self-pay

## 2018-09-11 DIAGNOSIS — G5712 Meralgia paresthetica, left lower limb: Secondary | ICD-10-CM | POA: Diagnosis not present

## 2018-09-11 DIAGNOSIS — Z79899 Other long term (current) drug therapy: Secondary | ICD-10-CM | POA: Diagnosis not present

## 2018-09-11 DIAGNOSIS — F17210 Nicotine dependence, cigarettes, uncomplicated: Secondary | ICD-10-CM | POA: Diagnosis not present

## 2018-09-11 DIAGNOSIS — R200 Anesthesia of skin: Secondary | ICD-10-CM | POA: Diagnosis present

## 2018-09-11 LAB — URINALYSIS, ROUTINE W REFLEX MICROSCOPIC
Bilirubin Urine: NEGATIVE
Glucose, UA: NEGATIVE mg/dL
Hgb urine dipstick: NEGATIVE
Ketones, ur: NEGATIVE mg/dL
Leukocytes,Ua: NEGATIVE
Nitrite: NEGATIVE
Protein, ur: NEGATIVE mg/dL
Specific Gravity, Urine: 1.015 (ref 1.005–1.030)
pH: 7 (ref 5.0–8.0)

## 2018-09-11 LAB — CBC WITH DIFFERENTIAL/PLATELET
Abs Immature Granulocytes: 0.02 10*3/uL (ref 0.00–0.07)
Basophils Absolute: 0 10*3/uL (ref 0.0–0.1)
Basophils Relative: 1 %
Eosinophils Absolute: 0.1 10*3/uL (ref 0.0–0.5)
Eosinophils Relative: 1 %
HCT: 40.8 % (ref 36.0–46.0)
Hemoglobin: 13.7 g/dL (ref 12.0–15.0)
Immature Granulocytes: 0 %
Lymphocytes Relative: 46 %
Lymphs Abs: 3.5 10*3/uL (ref 0.7–4.0)
MCH: 30.7 pg (ref 26.0–34.0)
MCHC: 33.6 g/dL (ref 30.0–36.0)
MCV: 91.5 fL (ref 80.0–100.0)
Monocytes Absolute: 0.5 10*3/uL (ref 0.1–1.0)
Monocytes Relative: 7 %
Neutro Abs: 3.3 10*3/uL (ref 1.7–7.7)
Neutrophils Relative %: 45 %
Platelets: 294 10*3/uL (ref 150–400)
RBC: 4.46 MIL/uL (ref 3.87–5.11)
RDW: 12.5 % (ref 11.5–15.5)
WBC: 7.5 10*3/uL (ref 4.0–10.5)
nRBC: 0 % (ref 0.0–0.2)

## 2018-09-11 LAB — BASIC METABOLIC PANEL
Anion gap: 10 (ref 5–15)
BUN: 10 mg/dL (ref 6–20)
CO2: 25 mmol/L (ref 22–32)
Calcium: 8.8 mg/dL — ABNORMAL LOW (ref 8.9–10.3)
Chloride: 102 mmol/L (ref 98–111)
Creatinine, Ser: 0.71 mg/dL (ref 0.44–1.00)
GFR calc Af Amer: >60 mL/min (ref >60)
GFR calc non Af Amer: >60 mL/min (ref >60)
Glucose, Bld: 96 mg/dL (ref 70–99)
Potassium: 3.8 mmol/L (ref 3.5–5.1)
Sodium: 137 mmol/L (ref 135–145)

## 2018-09-11 LAB — MAGNESIUM: Magnesium: 2.1 mg/dL (ref 1.7–2.4)

## 2018-09-11 LAB — PREGNANCY, URINE: Preg Test, Ur: NEGATIVE

## 2018-09-11 MED ORDER — IBUPROFEN 200 MG PO TABS
600.0000 mg | ORAL_TABLET | Freq: Once | ORAL | Status: AC
  Administered 2018-09-11: 16:00:00 600 mg via ORAL
  Filled 2018-09-11: qty 3

## 2018-09-11 NOTE — Discharge Instructions (Signed)
If you develop severe worsening pain, weakness in your leg or legs, incontinence of your bowels or bladder, fever, or any other new/concerning symptoms then return to the ER for evaluation.  Otherwise some conservative treatments to start would be to avoid tight fitting garments and belts over your groin and use anti-inflammatories such as ibuprofen or Motrin.  You may still use Tylenol.

## 2018-09-11 NOTE — ED Provider Notes (Signed)
[facility]-EMERGENCY DEPT Provider Note   CSN: [id number] Arrival date & time: 09/11/18  1352    History   Chief Complaint Chief Complaint  Patient presents with  . leg numbness    HPI Theresa [name] is a 23 y.o. female.     HPI  23 year old female presents with left lateral thigh numbness.  There is also associated lateral thigh pain.  She states this is been ongoing for about 3 or 4 days.  Happened to her once in 2017 but she never got it checked out and it resolved on its own.  She has some chronic low back pain for the last 3 years but no new or worsening back pain.  The pain in her lateral thigh is intermittent but the numbness seems to be there all the time.  It is in a small patch from her knee to her mid thigh on the lateral aspect.  At times at rest it will occasionally travel across anteriorly.  No injuries.  She is able to ambulate.  She is tried some Tylenol without relief.  She denies any IV drug abuse, fevers, or bowel/bladder incontinence.  There is no weakness in the leg.  Past Medical History:  Diagnosis Date  . Abnormal vaginal Pap smear   . Anxiety   . Bipolar disorder (HCC)   . Depression   . Migraine without aura   . Polycystic ovary syndrome   . Psychosis (HCC)   . Schizophrenia (HCC)   . STD (sexually transmitted disease) 10-14-15   chlamydia     Patient Active Problem List   Diagnosis Date Noted  . 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 Schizoaffective disorder, bipolar type (HCC) 10/17/2017  . UTI (urinary tract infection) 09/28/2017  . 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 Bipolar affective disorder (HCC) 09/20/2017  . Bipolar affective disorder, mixed, severe, with psychotic behavior (HCC) 10/04/2016  . PCOS (polycystic ovarian syndrome) 01/18/2016  . Cannabis use disorder, moderate, dependence (HCC) 12/06/2015  . Tobacco use disorder 12/05/2015    History reviewed. No pertinent surgical history.   OB History     Gravida  0   Para  0   Term  0   Preterm  0   AB  0   Living  0     SAB  0   TAB  0   Ectopic  0   Multiple  0   Live Births               Home Medications    Prior to Admission medications   Medication Sig Start Date End Date Taking? Authorizing Provider  benztropine (COGENTIN) 1 MG tablet Take 1 tablet (1 mg total) by mouth 2 (two) times daily. 07/06/18   Pucilowska, Jolanta B, MD  buprenorphine-naloxone (SUBOXONE) 8-2 mg SUBL SL tablet Place 0.5 tablets under the tongue 2 (two) times daily. 07/27/18   [provider]  paliperidone (INVEGA SUSTENNA) 234 MG/1.5ML SUSY injection Inject 234 mg into the muscle every 28 (twenty-eight) days. Next injection on 06/29/2018 07/29/18   Pucilowska, Braulio ConteJolanta B, MD  traZODone (DESYREL) 100 MG tablet Take 1 tablet (100 mg total) by mouth at bedtime. 07/06/18   Shari ProwsPucilowska, Jolanta B, MD    Family History Family History  Problem Relation Age of Onset  . Hypertension Mother   . Heart disease Mother   . Cancer Mother   . Hypertension Other   . Depression Father   . Drug abuse Father   .  Bipolar disorder Father   . Heart disease Maternal Grandmother   . Pancreatitis Maternal Grandfather   . Cancer Paternal Grandmother   . Cancer Paternal Grandfather     Social History Social History   Tobacco Use  . Smoking status: Current Every Day Smoker    Packs/day: 0.50    Types: Cigarettes  . Smokeless tobacco: Never Used  Substance Use Topics  . Alcohol use: Yes    Alcohol/week: 0.0 standard drinks  . Drug use: Yes    Frequency: 2.0 times per week    Types: Marijuana     Allergies   Patient has no known allergies.   Review of Systems Review of Systems  Constitutional: Negative for fever.  Gastrointestinal: Negative for abdominal pain.  Genitourinary: Negative for dysuria and hematuria.       No incontinence  Musculoskeletal: Positive for back pain.  Neurological: Positive for numbness. Negative for weakness.   All other systems reviewed and are negative.    Physical Exam Updated Vital Signs BP (!) 142/83 (BP Location: Right Arm)   Pulse (!) 104   Temp 98.2 F (36.8 C) (Oral)   Resp 20   LMP 08/14/2018   SpO2 99%   Physical Exam Vitals signs and nursing note reviewed.  Constitutional:      Appearance: She is well-developed.  HENT:     Head: Normocephalic and atraumatic.     Right Ear: External ear normal.     Left Ear: External ear normal.     Nose: Nose normal.  Eyes:     General:        Right eye: No discharge.        Left eye: No discharge.  Cardiovascular:     Rate and Rhythm: Normal rate and regular rhythm.     Pulses:          Dorsalis pedis pulses are 2+ on the right side and 2+ on the left side.  Pulmonary:     Effort: Pulmonary effort is normal.  Abdominal:     General: There is no distension.     Palpations: Abdomen is soft.     Tenderness: There is no abdominal tenderness.  Musculoskeletal:     Left hip: She exhibits normal range of motion and no tenderness.     Left knee: She exhibits normal range of motion and no swelling. No tenderness found.     Left upper leg: She exhibits no tenderness and no swelling.       Legs:  Skin:    General: Skin is warm and dry.  Neurological:     Mental Status: She is alert.     Comments: 5/5 strength in BLE. Normal sensation grossly and to sharp/dull except small patch on MSK picture  Psychiatric:        Mood and Affect: Mood is not anxious.      ED Treatments / Results  Labs (all labs ordered are listed, but only abnormal results are displayed) Labs Reviewed  BASIC METABOLIC PANEL - Abnormal; Notable for the following components:      Result Value   Calcium 8.8 (*)    All other components within normal limits  URINALYSIS, ROUTINE W REFLEX MICROSCOPIC - Abnormal; Notable for the following components:   Bacteria, UA RARE (*)    All other components within normal limits  CBC WITH DIFFERENTIAL/PLATELET  PREGNANCY,  URINE  MAGNESIUM    EKG None  Radiology No results found.  Procedures Procedures (including critical care  time)  Medications Ordered in ED Medications  ibuprofen (ADVIL) tablet 600 mg (600 mg Oral Given 09/11/18 1539)     Initial Impression / Assessment and Plan / ED Course  I have reviewed the triage vital signs and the nursing notes.  Pertinent labs & imaging results that were available during my care of the patient were reviewed by me and considered in my medical decision making (see chart for details).        Given the location, this is most likely meralgia paresthetica.  It is a little odd that it sometimes goes anteriorly, but otherwise this appears to be a local nerve issue rather than a spinal cord problem.  Very low suspicion for spinal cord emergency.  I do not think she needs an emergent MRI.  We have discussed conservative measures such as anti-inflammatories and loosefitting clothing and weight loss.  Advised of return precautions but at this point she has good strength and no other concerning findings.  No evidence of rhabdomyolysis with no myoglobinuria or renal dysfunction.  Electrolytes are benign besides minimal hypocalcemia.  Discharge home with outpatient neurology follow-up and return precautions.  Final Clinical Impressions(s) / ED Diagnoses   Final diagnoses:  Meralgia paresthetica of left side    ED Discharge Orders         Ordered    Ambulatory referral to Neurology    Comments:  An appointment is requested in approximately: 2 weeks   09/11/18 1624           Pricilla Loveless, MD 09/11/18 [phone]

## 2018-09-11 NOTE — ED Triage Notes (Signed)
Pt reports that she had left mid thigh to left knee numbness for 4 days.  Pt reports happened back in 2017. Reports pins and needles and feel a pop sometimes when goes to sit down. Pt was ambulatory from lobby to triage room.

## 2018-11-09 ENCOUNTER — Ambulatory Visit: Payer: Medicaid Other | Admitting: Neurology

## 2018-11-25 ENCOUNTER — Telehealth: Payer: Self-pay | Admitting: *Deleted

## 2018-11-25 ENCOUNTER — Ambulatory Visit: Payer: Self-pay | Admitting: Neurology

## 2018-11-25 NOTE — Telephone Encounter (Signed)
No showed new patient appointment. 

## 2019-03-15 ENCOUNTER — Emergency Department
Admission: EM | Admit: 2019-03-15 | Discharge: 2019-03-16 | Disposition: A | Discharge status: Other Acute Inpatient Hospital | Payer: Medicare Other | Attending: Student | Admitting: Student

## 2019-03-15 ENCOUNTER — Other Ambulatory Visit: Payer: Self-pay

## 2019-03-15 ENCOUNTER — Encounter: Payer: Self-pay | Admitting: Intensive Care

## 2019-03-15 DIAGNOSIS — F1220 Cannabis dependence, uncomplicated: Secondary | ICD-10-CM | POA: Diagnosis present

## 2019-03-15 DIAGNOSIS — F17210 Nicotine dependence, cigarettes, uncomplicated: Secondary | ICD-10-CM | POA: Diagnosis not present

## 2019-03-15 DIAGNOSIS — F1910 Other psychoactive substance abuse, uncomplicated: Secondary | ICD-10-CM | POA: Diagnosis not present

## 2019-03-15 DIAGNOSIS — E282 Polycystic ovarian syndrome: Secondary | ICD-10-CM | POA: Diagnosis present

## 2019-03-15 DIAGNOSIS — F250 Schizoaffective disorder, bipolar type: Secondary | ICD-10-CM | POA: Diagnosis not present

## 2019-03-15 DIAGNOSIS — R440 Auditory hallucinations: Secondary | ICD-10-CM | POA: Diagnosis present

## 2019-03-15 DIAGNOSIS — Z79899 Other long term (current) drug therapy: Secondary | ICD-10-CM | POA: Diagnosis not present

## 2019-03-15 DIAGNOSIS — F17200 Nicotine dependence, unspecified, uncomplicated: Secondary | ICD-10-CM | POA: Diagnosis present

## 2019-03-15 DIAGNOSIS — Z20828 Contact with and (suspected) exposure to other viral communicable diseases: Secondary | ICD-10-CM | POA: Insufficient documentation

## 2019-03-15 DIAGNOSIS — F3164 Bipolar disorder, current episode mixed, severe, with psychotic features: Secondary | ICD-10-CM | POA: Diagnosis present

## 2019-03-15 DIAGNOSIS — F1510 Other stimulant abuse, uncomplicated: Secondary | ICD-10-CM | POA: Diagnosis present

## 2019-03-15 DIAGNOSIS — F319 Bipolar disorder, unspecified: Secondary | ICD-10-CM | POA: Diagnosis present

## 2019-03-15 DIAGNOSIS — F203 Undifferentiated schizophrenia: Secondary | ICD-10-CM | POA: Diagnosis present

## 2019-03-15 DIAGNOSIS — Z008 Encounter for other general examination: Secondary | ICD-10-CM

## 2019-03-15 DIAGNOSIS — N390 Urinary tract infection, site not specified: Secondary | ICD-10-CM | POA: Diagnosis present

## 2019-03-15 LAB — CBC
HCT: 45.1 % (ref 36.0–46.0)
Hemoglobin: 15.3 g/dL — ABNORMAL HIGH (ref 12.0–15.0)
MCH: 30.2 pg (ref 26.0–34.0)
MCHC: 33.9 g/dL (ref 30.0–36.0)
MCV: 89.1 fL (ref 80.0–100.0)
Platelets: 386 10*3/uL (ref 150–400)
RBC: 5.06 MIL/uL (ref 3.87–5.11)
RDW: 12.3 % (ref 11.5–15.5)
WBC: 11.3 10*3/uL — ABNORMAL HIGH (ref 4.0–10.5)
nRBC: 0 % (ref 0.0–0.2)

## 2019-03-15 LAB — COMPREHENSIVE METABOLIC PANEL
ALT: 18 U/L (ref 0–44)
AST: 21 U/L (ref 15–41)
Albumin: 5 g/dL (ref 3.5–5.0)
Alkaline Phosphatase: 127 U/L — ABNORMAL HIGH (ref 38–126)
Anion gap: 10 (ref 5–15)
BUN: 13 mg/dL (ref 6–20)
CO2: 24 mmol/L (ref 22–32)
Calcium: 9.6 mg/dL (ref 8.9–10.3)
Chloride: 105 mmol/L (ref 98–111)
Creatinine, Ser: 0.75 mg/dL (ref 0.44–1.00)
GFR calc Af Amer: >60 mL/min (ref >60)
GFR calc non Af Amer: >60 mL/min (ref >60)
Glucose, Bld: 143 mg/dL — ABNORMAL HIGH (ref 70–99)
Potassium: 3.4 mmol/L — ABNORMAL LOW (ref 3.5–5.1)
Sodium: 139 mmol/L (ref 135–145)
Total Bilirubin: 0.8 mg/dL (ref 0.3–1.2)
Total Protein: 8.7 g/dL — ABNORMAL HIGH (ref 6.5–8.1)

## 2019-03-15 LAB — SARS CORONAVIRUS 2 BY RT PCR (HOSPITAL ORDER, PERFORMED IN ~~LOC~~ HOSPITAL LAB): SARS Coronavirus 2: NEGATIVE

## 2019-03-15 LAB — URINE DRUG SCREEN, QUALITATIVE (ARMC ONLY)
Amphetamines, Ur Screen: POSITIVE — AB
Barbiturates, Ur Screen: NOT DETECTED
Benzodiazepine, Ur Scrn: POSITIVE — AB
Cannabinoid 50 Ng, Ur ~~LOC~~: POSITIVE — AB
Cocaine Metabolite,Ur ~~LOC~~: NOT DETECTED
MDMA (Ecstasy)Ur Screen: NOT DETECTED
Methadone Scn, Ur: NOT DETECTED
Opiate, Ur Screen: NOT DETECTED
Phencyclidine (PCP) Ur S: NOT DETECTED
Tricyclic, Ur Screen: NOT DETECTED

## 2019-03-15 LAB — ETHANOL: Alcohol, Ethyl (B): <10 mg/dL (ref <10)

## 2019-03-15 LAB — POCT PREGNANCY, URINE: Preg Test, Ur: NEGATIVE

## 2019-03-15 MED ORDER — OLANZAPINE 5 MG PO TBDP
5.0000 mg | ORAL_TABLET | Freq: Every day | ORAL | Status: DC
  Administered 2019-03-15: 5 mg via ORAL
  Filled 2019-03-15 (×3): qty 1

## 2019-03-15 MED ORDER — LORAZEPAM 2 MG/ML IJ SOLN
1.0000 mg | Freq: Once | INTRAMUSCULAR | Status: AC
  Administered 2019-03-15: 1 mg via INTRAMUSCULAR
  Filled 2019-03-15: qty 1

## 2019-03-15 MED ORDER — BENZTROPINE MESYLATE 1 MG PO TABS
1.0000 mg | ORAL_TABLET | Freq: Two times a day (BID) | ORAL | Status: DC
  Administered 2019-03-16: 1 mg via ORAL
  Filled 2019-03-15 (×2): qty 1

## 2019-03-15 MED ORDER — TRAZODONE HCL 100 MG PO TABS
100.0000 mg | ORAL_TABLET | Freq: Every day | ORAL | Status: DC
  Filled 2019-03-15: qty 1

## 2019-03-15 NOTE — ED Notes (Addendum)
Patient asks to speak to nurse. Gazing at nurse during questions and then states "Get behind me Edmonia Lynch" repeatedly softly. Patient assured she is safe here and we will check on her frequently. States please do. Informed will try to get psychiatrist to speak to in person and smiles. As nurse leaves gives me warning "dont get lost in your computer".

## 2019-03-15 NOTE — ED Provider Notes (Addendum)
[facility] Emergency Department Provider Note  ____________________________________________   First MD Initiated Contact with Patient 03/15/19 1743     (approximate)  I have reviewed the triage vital signs and the nursing notes.  History  Chief Complaint Psychiatric Evaluation    HPI Theresa [name] is a 23 y.o. female with a history of bipolar disorder, schizophrenia, psychosis who presents to the emergency department for psychiatric evaluation.  Patient was apparently 2 weeks late in getting her Invega shot.  Her mom reports that she has been hearing voices for several days and is talking back to them.  Stares off often in the space, which she does here in the emergency department.  Patient does admit to auditory hallucinations, hearing voices, but unable to make out what they are saying.  In triage, she reported to the RN that she was feeling very spiritual and was mad at God.  She does admit to polysubstance abuse, last using meth 2 days ago.  She denies any SI or HI.   Past Medical Hx Past Medical History:  Diagnosis Date  . Abnormal vaginal Pap smear   . Anxiety   . Bipolar disorder ([place])   . Depression   . Migraine without aura   . Polycystic ovary syndrome   . Psychosis ([place])   . Schizophrenia ([place])   . STD (sexually transmitted disease) 10-14-15   chlamydia     Problem List Patient Active Problem List   Diagnosis Date Noted  . Amphetamine abuse ([place])   . Schizoaffective disorder, bipolar type ([place]) 10/17/2017  . UTI (urinary tract infection) 09/28/2017  . Undifferentiated schizophrenia ([place]) 09/23/2017  . Bipolar affective disorder ([place]) 09/20/2017  . Bipolar affective disorder, mixed, severe, with psychotic behavior ([place]) 10/04/2016  . PCOS (polycystic ovarian syndrome) 01/18/2016  . Cannabis use disorder, moderate, dependence ([place]) 12/06/2015  . Tobacco use disorder 12/05/2015    Past Surgical Hx History reviewed. No pertinent  surgical history.  Medications Prior to Admission medications   Medication Sig Start Date End Date Taking? Authorizing Provider  benztropine (COGENTIN) 1 MG tablet Take 1 tablet (1 mg total) by mouth 2 (two) times daily. 07/06/18   Pucilowska, Jolanta B, MD  buprenorphine-naloxone (SUBOXONE) 8-2 mg SUBL SL tablet Place 0.5 tablets under the tongue 2 (two) times daily. 07/27/18   [provider]  paliperidone (INVEGA SUSTENNA) 234 MG/1.5ML SUSY injection Inject 234 mg into the muscle every 28 (twenty-eight) days. Next injection on 06/29/2018 07/29/18   Pucilowska, Herma Ard B, MD  traZODone (DESYREL) 100 MG tablet Take 1 tablet (100 mg total) by mouth at bedtime. 07/06/18   Pucilowska, Wardell Honour, MD    Allergies Patient has no known allergies.  Family Hx Family History  Problem Relation Age of Onset  . Hypertension Mother   . Heart disease Mother   . Cancer Mother   . Hypertension Other   . Depression Father   . Drug abuse Father   . Bipolar disorder Father   . Heart disease Maternal Grandmother   . Pancreatitis Maternal Grandfather   . Cancer Paternal Grandmother   . Cancer Paternal Grandfather     Social Hx Social History   Tobacco Use  . Smoking status: Current Every Day Smoker    Packs/day: 0.50    Types: Cigarettes  . Smokeless tobacco: Never Used  Substance Use Topics  . Alcohol use: Yes    Alcohol/week: 0.0 standard drinks  . Drug use: Yes    Frequency: 2.0 times  per week    Types: Marijuana, Methamphetamines, Cocaine    Comment: meth, heroin, crack     Review of Systems  Constitutional: Negative for fever, chills. Eyes: Negative for visual changes. ENT: Negative for sore throat. Cardiovascular: Negative for chest pain. Respiratory: Negative for shortness of breath. Gastrointestinal: Negative for nausea, vomiting.  Genitourinary: Negative for dysuria. Musculoskeletal: Negative for leg swelling. Skin: Negative for rash. Neurological: Negative for for  headaches.   Physical Exam  Vital Signs: ED Triage Vitals  Enc Vitals Group     BP 03/15/19 1718 127/78     Pulse Rate 03/15/19 1718 98     Resp 03/15/19 1718 16     Temp 03/15/19 1718 98.1 F (36.7 C)     Temp Source 03/15/19 1718 Oral     SpO2 03/15/19 1718 99 %     Weight 03/15/19 1707 204 lb 6.4 oz (92.7 kg)     Height 03/15/19 1713 5' 3.5" (1.613 m)     Head Circumference --      Peak Flow --      Pain Score 03/15/19 1713 0     Pain Loc --      Pain Edu? --      Excl. in GC? --     Constitutional: Alert and oriented. Intermittently stares off into space. Doesn't answer all questions.  Head: Normocephalic. Atraumatic. Eyes: Conjunctivae clear. Sclera anicteric. Nose: No congestion. No rhinorrhea. Mouth/Throat: Mucous membranes are moist.  Neck: No stridor.   Cardiovascular: Normal rate. Extremities well perfused. Respiratory: Normal respiratory effort.   Gastrointestinal: Non-distended.  Musculoskeletal: No deformities. Neurologic:  Normal speech and language. No gross focal neurologic deficits are appreciated.  Skin: Skin is warm, dry and intact.  Psychiatric: Reports auditory hallucinations.  Denies SI or HI.  EKG  N/A    Radiology  N/A   Procedures  Procedure(s) performed (including critical care):  Procedures   Initial Impression / Assessment and Plan / ED Course  23 y.o. female who presents to the ED for psychiatric evaluation.  Patient was 2 weeks late in getting her Invega shot, and is now is experiencing auditory hallucinations, staring off into space, substance abuse.  Suspect patient's presentation is related to their known psychiatric diagnosis.  Likely worsened by the delay in receiving her Invega shot and by her substance abuse.  No evidence of underlying metabolic or infectious etiology. Will obtain basic screening labs and consult psychiatry and TTS.   Given her hallucinations, substance use, missed medication, will place patient under  IVC.  Final Clinical Impression(s) / ED Diagnosis  Final diagnoses:  Evaluation by psychiatric service required  Auditory hallucinations  Polysubstance abuse [facility])       Note:  This document was prepared using Dragon voice recognition software and may include unintentional dictation errors.     Miguel Aschoff., MD 03/16/19 [phone]

## 2019-03-15 NOTE — ED Notes (Signed)
VOL, SOC called

## 2019-03-15 NOTE — ED Notes (Signed)
Colored stripped leggings Tank top Hair tie Pink flip flops    Lm edt

## 2019-03-15 NOTE — ED Triage Notes (Signed)
Patient was two weeks late getting her invega 234mg  shot. Mom reports she has been hearing voices X2 days and talking back to them. Stares off often. Having insomnia. Patient states "I have been hearing two voices but I couldn't make out what they were saying" Patient also reports feeling very spiritual and mad at god. Patient has taken 5 showers since yesterday. Patient did meth X2 days ago. Patient has also been using heroin, crack and marijuana. Does not drink alcohol. Denies SI/HI.

## 2019-03-15 NOTE — ED Notes (Addendum)
Pt states she might start period and mother said she might not tell you.  Pts mom states she forgets to state certain things at times.  Glee Arvin (mother) [phone]

## 2019-03-15 NOTE — ED Notes (Signed)
Patient walking out in the hallway asked patient to go back in room , patient turned around and went back.

## 2019-03-15 NOTE — ED Notes (Signed)
BEHAVIORAL HEALTH ROUNDING Patient sleeping: No. Patient alert and oriented: yes Behavior appropriate: No.; If no, describe: wandering Nutrition and fluids offered: Yes  Toileting and hygiene offered: Yes  Sitter present: not applicable Law enforcement present: Yes

## 2019-03-15 NOTE — ED Notes (Signed)
Hourly rounding reveals patient in room. No complaints, stable, in no acute distress. Q15 minute rounds and monitoring via Security Cameras to continue. 

## 2019-03-15 NOTE — ED Notes (Signed)
Patient repeatedly walking out of room. Can be redirected back to room at this time.

## 2019-03-15 NOTE — ED Notes (Signed)
Report to include Situation, Background, Assessment, and Recommendations received from [facility]. Patient alert and oriented, warm and dry, in no acute distress. Patient is disorganized and blunt facial expression. Delayed to respond to questions. Patient made aware of Q15 minute rounds and Engineer, drilling presence for their safety. Patient instructed to come to me with needs or concerns.

## 2019-03-15 NOTE — ED Notes (Signed)
Hourly rounding reveals patient talking to TTS in room. No complaints, stable, in no acute distress. Q15 minute rounds and monitoring via Security Cameras to continue. 

## 2019-03-15 NOTE — ED Notes (Signed)
Pt. Transferred to [facility] from ED to room 4 after screening for contraband. Report to include Situation, Background, Assessment and Recommendations from Hewan RN. Pt. Oriented to unit including Q15 minute rounds as well as the security cameras for their protection. Patient is alert and oriented, warm and dry in no acute distress. Patient denies SI, HI, and AVH. Pt. Encouraged to let me know if needs arise. 

## 2019-03-15 NOTE — ED Notes (Signed)
Patient is restless, going to patient's room and attempting to leave the hospital. Per mother patient didn't have sleep for two days. EDP notified

## 2019-03-15 NOTE — ED Notes (Signed)
Patient states does not want to "talk to computer". Wants to wait til can speak to psychiatrist in person.

## 2019-03-15 NOTE — ED Notes (Signed)
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
## 2019-03-15 NOTE — ED Notes (Signed)
VOL, pt refuses to [facility], pend psych consult

## 2019-03-15 NOTE — Consult Note (Signed)
[facility] Face-to-Face Psychiatry Consult   Reason for Consult: Psychiatric evaluation Referring Physician: Dr. Colon Branch Patient Identification: Theresa [name] MRN:  [id number] Principal Diagnosis: Schizoaffective disorder, bipolar type (HCC) Diagnosis:  Principal Problem:   Schizoaffective disorder, bipolar type (HCC) Active Problems:   Tobacco use disorder   Cannabis use disorder, moderate, dependence (HCC)   PCOS (polycystic ovarian syndrome)   Bipolar affective disorder, mixed, severe, with psychotic behavior (HCC)   Bipolar affective disorder (HCC)   Undifferentiated schizophrenia (HCC)   UTI (urinary tract infection)   Amphetamine abuse (HCC)   Total Time spent with patient: 1 hour  Subjective: "I am hearing things." Theresa [name] is a 23 y.o. female patient presented to [facility] ED via enforcement under involuntary commitment status (IVC). The patient was two weeks late getting her Invega Maintena 234 mg injection per the ED triage nursing note, reports mom.  It was discussed the patient has been hearing voices for two days and maybe longer.  She has also been responding to the voices.  Per the ED triage nursing note, the patient used illicit drugs such as heroin, crack cocaine, and marijuana.  The patient UDS showed amphetamine, cannabinoid, and benzos.  The patient-nurse discussed that the patient's mom gave her Xanax 10 mg this a.m.(10.19.20) to calm her down and hope that she would sleep. The patient was seen face-to-face by this provider; chart reviewed and consulted with Dr. Colon Branch on 03/15/2019 due to the patient's care. It was discussed with the EDP that the patient does meet the criteria to be admitted to the psychiatric inpatient unit once a bed becomes available. On evaluation, the patient is alert and oriented x3, disorganized with delayed response to questions; she admitted to hearing voices during her assessment-the patient mood-congruent with affect. The patient does appear  to be responding to internal stimuli. The patient is presenting with delusional thinking. The patient admits to auditory hallucinations but denies visual hallucinations. The patient denies any suicidal, homicidal, or self-harm ideations. The patient is presenting with some psychotic and paranoia behaviors. During an encounter with the patient, she could not answer questions appropriately due to her being delayed in her response to the questions when posed to her. Collateral was obtained by the mother, Theresa [name] [phone]), who expressed concerns about the patient's psychotic behavior and not being able to sleep for the past two days.   The patient and mom discussed that the patient has been using substances and has had a psychotic break, which has happened in the past, and it took her months to get back to her baseline.  Mom states the patient has been hanging out with a guy who uses illegal substances and believes he has given some drugs to her daughter.  She says her daughter came over to her house two days ago and heard voices and responded to it.  She states the patient received her Invega injection, which she was two weeks past due for her injection.  Mom says, "I know she is using drugs, and I believe that is why she is psychotic." Mom voiced, "my daughter has not slept for over two days, and in the past, once she had gotten some sleep, she becomes Clearer." She voiced that the patient was at [facility] for over 60 days due to her psychotic break. Plan: The patient is a safety risk to self and does require psychiatric inpatient admission for stabilization and treatment. HPI: Per Dr. Colon Branch; Theresa [name] is a 23 y.o.  female with a history of bipolar disorder, schizophrenia, psychosis who presents to the emergency department for psychiatric evaluation.  Patient was apparently 2 weeks late in getting her Invega shot.  Her mom reports that she has been hearing voices for  several days and is talking back to them.  Stares off often in the space, which she does here in the emergency department.  Patient does admit to auditory hallucinations, hearing voices, but unable to make out what they are saying.  In triage, she reported to the RN that she was feeling very spiritual and was mad at God.  She does admit to polysubstance abuse, last using meth 2 days ago.  She denies any SI or HI.  Past Psychiatric History: Anxiety Bipolar disorder (HCC) Depression Psychosis (HCC) Schizophrenia (HCC)  Risk to Self:  Yes Risk to Others:  No Prior Inpatient Therapy:  Yes Prior Outpatient Therapy:  Yes  Past Medical History:  Past Medical History:  Diagnosis Date  . Abnormal vaginal Pap smear   . Anxiety   . Bipolar disorder (HCC)   . Depression   . Migraine without aura   . Polycystic ovary syndrome   . Psychosis (HCC)   . Schizophrenia (HCC)   . STD (sexually transmitted disease) 10-14-15   chlamydia    History reviewed. No pertinent surgical history. Family History:  Family History  Problem Relation Age of Onset  . Hypertension Mother   . Heart disease Mother   . Cancer Mother   . Hypertension Other   . Depression Father   . Drug abuse Father   . Bipolar disorder Father   . Heart disease Maternal Grandmother   . Pancreatitis Maternal Grandfather   . Cancer Paternal Grandmother   . Cancer Paternal Grandfather    Family Psychiatric  History: Paternal family members (psychosis, schizophrenia and bipolar disorder) Social History:  Social History   Substance and Sexual Activity  Alcohol Use Yes  . Alcohol/week: 0.0 standard drinks     Social History   Substance and Sexual Activity  Drug Use Yes  . Frequency: 2.0 times per week  . Types: Marijuana, Methamphetamines, Cocaine   Comment: meth, heroin, crack    Social History   Socioeconomic History  . Marital status: Legally Separated    Spouse name: Not on file  . Number of children: Not on file   . Years of education: Not on file  . Highest education level: Not on file  Occupational History  . Not on file  Social Needs  . Financial resource strain: Not on file  . Food insecurity    Worry: Not on file    Inability: Not on file  . Transportation needs    Medical: Not on file    Non-medical: Not on file  Tobacco Use  . Smoking status: Current Every Day Smoker    Packs/day: 0.50    Types: Cigarettes  . Smokeless tobacco: Never Used  Substance and Sexual Activity  . Alcohol use: Yes    Alcohol/week: 0.0 standard drinks  . Drug use: Yes    Frequency: 2.0 times per week    Types: Marijuana, Methamphetamines, Cocaine    Comment: meth, heroin, crack  . Sexual activity: Yes    Partners: Male    Birth control/protection: None  Lifestyle  . Physical activity    Days per week: Not on file    Minutes per session: Not on file  . Stress: Not on file  Relationships  . Social connections  Talks on phone: Not on file    Gets together: Not on file    Attends religious service: Not on file    Active member of club or organization: Not on file    Attends meetings of clubs or organizations: Not on file    Relationship status: Not on file  Other Topics Concern  . Not on file  Social History Narrative  . Not on file   Additional Social History:    Allergies:  No Known Allergies  Labs:  Results for orders placed or performed during the hospital encounter of 03/15/19 (from the past 48 hour(s))  Comprehensive metabolic panel     Status: Abnormal   Collection Time: 03/15/19  5:33 PM  Result Value Ref Range   Sodium 139 135 - 145 mmol/L   Potassium 3.4 (L) 3.5 - 5.1 mmol/L   Chloride 105 98 - 111 mmol/L   CO2 24 22 - 32 mmol/L   Glucose, Bld 143 (H) 70 - 99 mg/dL   BUN 13 6 - 20 mg/dL   Creatinine, Ser 0.75 0.44 - 1.00 mg/dL   Calcium 9.6 8.9 - 10.3 mg/dL   Total Protein 8.7 (H) 6.5 - 8.1 g/dL   Albumin 5.0 3.5 - 5.0 g/dL   AST 21 15 - 41 U/L   ALT 18 0 - 44 U/L    Alkaline Phosphatase 127 (H) 38 - 126 U/L   Total Bilirubin 0.8 0.3 - 1.2 mg/dL   GFR calc non Af Amer >60 >60 mL/min   GFR calc Af Amer >60 >60 mL/min   Anion gap 10 5 - 15    Comment: Performed at [facility], [address], [place], [place] [address]  Ethanol     Status: None   Collection Time: 03/15/19  5:33 PM  Result Value Ref Range   Alcohol, Ethyl (B) <10 <10 mg/dL    Comment: (NOTE) Lowest detectable limit for serum alcohol is 10 mg/dL. For medical purposes only. Performed at [facility], [address] [place], [place] [address]   cbc     Status: Abnormal   Collection Time: 03/15/19  5:33 PM  Result Value Ref Range   WBC 11.3 (H) 4.0 - 10.5 K/uL   RBC 5.06 3.87 - 5.11 MIL/uL   Hemoglobin 15.3 (H) 12.0 - 15.0 g/dL   HCT 45.1 36.0 - 46.0 %   MCV 89.1 80.0 - 100.0 fL   MCH 30.2 26.0 - 34.0 pg   MCHC 33.9 30.0 - 36.0 g/dL   RDW 12.3 11.5 - 15.5 %   Platelets 386 150 - 400 K/uL   nRBC 0.0 0.0 - 0.2 %    Comment: Performed at [facility], [address], [place], [place] [address]  Urine Drug Screen, Qualitative     Status: Abnormal   Collection Time: 03/15/19  5:34 PM  Result Value Ref Range   Tricyclic, Ur Screen NONE DETECTED NONE DETECTED   Amphetamines, Ur Screen POSITIVE (A) NONE DETECTED   MDMA (Ecstasy)Ur Screen NONE DETECTED NONE DETECTED   Cocaine Metabolite,Ur [place] NONE DETECTED NONE DETECTED   Opiate, Ur Screen NONE DETECTED NONE DETECTED   Phencyclidine (PCP) Ur S NONE DETECTED NONE DETECTED   Cannabinoid 50 Ng, Ur [place] POSITIVE (A) NONE DETECTED   Barbiturates, Ur Screen NONE DETECTED NONE DETECTED   Benzodiazepine, Ur Scrn POSITIVE (A) NONE DETECTED   Methadone Scn, Ur NONE DETECTED NONE DETECTED    Comment: (NOTE) Tricyclics + metabolites, urine  Cutoff 1000 ng/mL Amphetamines + metabolites, urine  Cutoff 1000 ng/mL MDMA (Ecstasy), urine              Cutoff 500 ng/mL Cocaine Metabolite, urine          Cutoff 300  ng/mL Opiate + metabolites, urine        Cutoff 300 ng/mL Phencyclidine (PCP), urine         Cutoff 25 ng/mL Cannabinoid, urine                 Cutoff 50 ng/mL Barbiturates + metabolites, urine  Cutoff 200 ng/mL Benzodiazepine, urine              Cutoff 200 ng/mL Methadone, urine                   Cutoff 300 ng/mL The urine drug screen provides only a preliminary, unconfirmed analytical test result and should not be used for non-medical purposes. Clinical consideration and professional judgment should be applied to any positive drug screen result due to possible interfering substances. A more specific alternate chemical method must be used in order to obtain a confirmed analytical result. Gas chromatography / mass spectrometry (GC/MS) is the preferred confirmat ory method. Performed at [facility], [address] [place], [place] [address]   Pregnancy, urine POC     Status: None   Collection Time: 03/15/19  5:54 PM  Result Value Ref Range   Preg Test, Ur NEGATIVE NEGATIVE    Comment:        THE SENSITIVITY OF THIS METHODOLOGY IS >24 mIU/mL     Current Facility-Administered Medications  Medication Dose Route Frequency Provider Last Rate Last Dose  . benztropine (COGENTIN) tablet 1 mg  1 mg Oral BID Miguel AschoffMonks, Sarah L., MD      . OLANZapine zydis [facility](ZYPREXA) disintegrating tablet 5 mg  5 mg Oral QHS Miguel AschoffMonks, Sarah L., MD   5 mg at 03/15/19 2012  . traZODone (DESYREL) tablet 100 mg  100 mg Oral QHS Miguel AschoffMonks, Sarah L., MD       Current Outpatient Medications  Medication Sig Dispense Refill  . benztropine (COGENTIN) 1 MG tablet Take 1 tablet (1 mg total) by mouth 2 (two) times daily. 60 tablet 1  . buprenorphine-naloxone (SUBOXONE) 8-2 mg SUBL SL tablet Place 0.5 tablets under the tongue 2 (two) times daily.    . paliperidone (INVEGA SUSTENNA) 234 MG/1.5ML SUSY injection Inject 234 mg into the muscle every 28 (twenty-eight) days. Next injection on 06/29/2018 1.8 mL 1  . traZODone  (DESYREL) 100 MG tablet Take 1 tablet (100 mg total) by mouth at bedtime. 30 tablet 1    Musculoskeletal: Strength & Muscle Tone: within normal limits Gait & Station: normal Patient leans: N/A  Psychiatric Specialty Exam: Physical Exam  Nursing note and vitals reviewed. Constitutional: She appears well-developed.  Neck: Normal range of motion. Neck supple.  Cardiovascular: Normal rate.  Musculoskeletal: Normal range of motion.    Review of Systems  Psychiatric/Behavioral: Positive for depression, hallucinations and substance abuse. The patient is nervous/anxious and has insomnia.   All other systems reviewed and are negative.   Blood pressure 127/78, pulse 98, temperature 98.1 F (36.7 C), temperature source Oral, resp. rate 16, height 5' 3.5" (1.613 m), weight 92.7 kg, SpO2 99 %.Body mass index is 35.64 kg/m.  General Appearance: Casual  Eye Contact:  Fair  Speech:  Blocked and Slow  Volume:  Decreased  Mood:  Depressed and Euphoric  Affect:  Blunt, Flat and Inappropriate  Thought Process:  Disorganized  Orientation:  Full (Time, Place, and Person)  Thought Content:  Illogical, Delusions and Hallucinations: Auditory  Suicidal Thoughts:  No  Homicidal Thoughts:  No  Memory:  Immediate;   Poor Recent;   Poor Remote;   Poor  Judgement:  Impaired  Insight:  Lacking  Psychomotor Activity:  Decreased and Restlessness  Concentration:  Concentration: Poor  Recall:  Poor  Fund of Knowledge:  Poor  Language:  Poor  Akathisia:  No  Handed:  Right  AIMS (if indicated):     Assets:  Communication Skills Desire for Improvement Resilience Social Support  ADL's:  Intact  Cognition:  Impaired,  Severe  Sleep:        Treatment Plan Summary: Daily contact with patient to assess and evaluate symptoms and progress in treatment, Medication management and Plan Patient meets criteria for psychiatric inpatient admission once a bed becomes available.  Disposition: Recommend  psychiatric Inpatient admission when medically cleared. Supportive therapy provided about ongoing stressors.  Gillermo Murdoch, NP 03/15/2019 9:13 PM

## 2019-03-16 ENCOUNTER — Inpatient Hospital Stay
Admission: RE | Admit: 2019-03-16 | Discharge: 2019-03-19 | DRG: 885 | Disposition: A | Discharge status: Home / Self Care | Payer: Medicare Other | Source: Intra-hospital | Attending: Psychiatry | Admitting: Psychiatry

## 2019-03-16 DIAGNOSIS — F29 Unspecified psychosis not due to a substance or known physiological condition: Secondary | ICD-10-CM | POA: Insufficient documentation

## 2019-03-16 DIAGNOSIS — F250 Schizoaffective disorder, bipolar type: Principal | ICD-10-CM | POA: Diagnosis present

## 2019-03-16 DIAGNOSIS — F17210 Nicotine dependence, cigarettes, uncomplicated: Secondary | ICD-10-CM | POA: Diagnosis present

## 2019-03-16 DIAGNOSIS — Z20828 Contact with and (suspected) exposure to other viral communicable diseases: Secondary | ICD-10-CM | POA: Diagnosis present

## 2019-03-16 DIAGNOSIS — Z7289 Other problems related to lifestyle: Secondary | ICD-10-CM | POA: Diagnosis not present

## 2019-03-16 DIAGNOSIS — R440 Auditory hallucinations: Secondary | ICD-10-CM | POA: Diagnosis present

## 2019-03-16 MED ORDER — ACETAMINOPHEN 325 MG PO TABS
650.0000 mg | ORAL_TABLET | Freq: Four times a day (QID) | ORAL | Status: DC | PRN
  Administered 2019-03-17 – 2019-03-18 (×2): 650 mg via ORAL
  Filled 2019-03-16 (×2): qty 2

## 2019-03-16 MED ORDER — OLANZAPINE 5 MG PO TBDP
5.0000 mg | ORAL_TABLET | Freq: Every day | ORAL | Status: DC
  Administered 2019-03-16: 5 mg via ORAL
  Filled 2019-03-16: qty 1

## 2019-03-16 MED ORDER — ALUM & MAG HYDROXIDE-SIMETH 200-200-20 MG/5ML PO SUSP: 30.0000 mL | ORAL | Status: DC | PRN

## 2019-03-16 MED ORDER — MAGNESIUM HYDROXIDE 400 MG/5ML PO SUSP: 30.0000 mL | Freq: Every day | ORAL | Status: DC | PRN

## 2019-03-16 MED ORDER — BENZTROPINE MESYLATE 1 MG PO TABS
1.0000 mg | ORAL_TABLET | Freq: Two times a day (BID) | ORAL | Status: DC
  Administered 2019-03-16 – 2019-03-19 (×6): 1 mg via ORAL
  Filled 2019-03-16 (×6): qty 1

## 2019-03-16 MED ORDER — NICOTINE 21 MG/24HR TD PT24
21.0000 mg | MEDICATED_PATCH | Freq: Every day | TRANSDERMAL | Status: DC
  Filled 2019-03-16: qty 1

## 2019-03-16 MED ORDER — TRAZODONE HCL 100 MG PO TABS
100.0000 mg | ORAL_TABLET | Freq: Every day | ORAL | Status: DC
  Administered 2019-03-16 – 2019-03-17 (×2): 100 mg via ORAL
  Filled 2019-03-16 (×2): qty 1

## 2019-03-16 NOTE — ED Notes (Signed)
Pt was allowed to shower and was given fresh clothing to wear.

## 2019-03-16 NOTE — ED Notes (Signed)
Patient transferred to [facility] with ED tech and officer, patient received transfer papers. Patient received belongings and verbalized she has received all of her belongings. Patient appropriate and cooperative, Denies SI/HI AVH. Vital signs taken. NAD noted.

## 2019-03-16 NOTE — BHH Suicide Risk Assessment (Signed)
[facility] Admission Suicide Risk Assessment   Nursing information obtained from:  Patient Demographic factors:  Caucasian Current Mental Status:  NA Loss Factors:  NA Historical Factors:  NA Risk Reduction Factors:  NA  Total Time spent with patient: 1 hour Principal Problem: Schizoaffective disorder, bipolar type (HCC) Diagnosis:  Principal Problem:   Schizoaffective disorder, bipolar type (HCC)  Subjective Data: 23 year old woman with schizoaffective disorder admitted to the hospital through the emergency room because of worsening hallucinations paranoia psychotic symptoms.  Continued Clinical Symptoms:  Alcohol Use Disorder Identification Test Final Score (AUDIT): 1 The "Alcohol Use Disorders Identification Test", Guidelines for Use in Primary Care, Second Edition.  World Science writer [facility]). Score between 0-7:  no or low risk or alcohol related problems. Score between 8-15:  moderate risk of alcohol related problems. Score between 16-19:  high risk of alcohol related problems. Score 20 or above:  warrants further diagnostic evaluation for alcohol dependence and treatment.   CLINICAL FACTORS:   Bipolar Disorder:   Depressive phase Schizophrenia:   Depressive state Less than 64 years old Paranoid or undifferentiated type   Musculoskeletal: Strength & Muscle Tone: within normal limits Gait & Station: normal Patient leans: N/A  Psychiatric Specialty Exam: Physical Exam  Nursing note and vitals reviewed. Constitutional: She appears well-developed and well-nourished.  HENT:  Head: Normocephalic and atraumatic.  Eyes: Pupils are equal, round, and reactive to light. Conjunctivae are normal.  Neck: Normal range of motion.  Cardiovascular: Regular rhythm and normal heart sounds.  Respiratory: Effort normal.  GI: Soft.  Musculoskeletal: Normal range of motion.  Neurological: She is alert.  Skin: Skin is warm and dry.  Psychiatric: Judgment normal. Her affect is blunt. Her  speech is delayed. She is slowed. Thought content is paranoid. Cognition and memory are impaired.    Review of Systems  Constitutional: Negative.   HENT: Negative.   Eyes: Negative.   Respiratory: Negative.   Cardiovascular: Negative.   Gastrointestinal: Negative.   Musculoskeletal: Negative.   Skin: Negative.   Neurological: Negative.   Psychiatric/Behavioral: Positive for depression, hallucinations, memory loss and substance abuse. Negative for suicidal ideas. The patient is nervous/anxious and has insomnia.     Blood pressure 117/73, pulse 77, temperature 98.4 F (36.9 C), temperature source Oral, resp. rate 16, height 5' 3.5" (1.613 m), weight 91.2 kg, SpO2 100 %.Body mass index is 35.05 kg/m.  General Appearance: Casual  Eye Contact:  Fair  Speech:  Slow  Volume:  Decreased  Mood:  Dysphoric  Affect:  Constricted  Thought Process:  Coherent  Orientation:  Full (Time, Place, and Person)  Thought Content:  Logical  Suicidal Thoughts:  No  Homicidal Thoughts:  No  Memory:  Immediate;   Fair Recent;   Fair Remote;   Fair  Judgement:  Fair  Insight:  Fair  Psychomotor Activity:  Decreased  Concentration:  Concentration: Fair  Recall:  [employer] of Knowledge:  Fair  Language:  Fair  Akathisia:  No  Handed:  Right  AIMS (if indicated):     Assets:  Desire for Improvement Housing Physical Health Resilience Social Support  ADL's:  Intact  Cognition:  Impaired,  Mild  Sleep:         COGNITIVE FEATURES THAT CONTRIBUTE TO RISK:  Closed-mindedness    SUICIDE RISK:   Mild:  Suicidal ideation of limited frequency, intensity, duration, and specificity.  There are no identifiable plans, no associated intent, mild dysphoria and related symptoms, good self-control (both objective and  subjective assessment), few other risk factors, and identifiable protective factors, including available and accessible social support.  PLAN OF CARE: Continue 15-minute checks.  Engage in  individual and group therapy.  Reassess suicidality daily.  Adjust medication as needed.  I certify that inpatient services furnished can reasonably be expected to improve the patient's condition.   [name], MD 03/16/2019, 1:45 PM

## 2019-03-16 NOTE — ED Notes (Signed)
Hourly rounding reveals patient sleeping in room. No complaints, stable, in no acute distress. Q15 minute rounds and monitoring via Security Cameras to continue. 

## 2019-03-16 NOTE — H&P (Signed)
Psychiatric Admission Assessment Adult  Patient Identification: Theresa [name] MRN:  [id number] Date of Evaluation:  03/16/2019 Chief Complaint:  psychosis Principal Diagnosis: Schizoaffective disorder, bipolar type (HCC) Diagnosis:  Principal Problem:   Schizoaffective disorder, bipolar type (HCC)  History of Present Illness: 23 year old woman presented through the emergency room because of a return of hallucinations.  In interview today the patient was cooperative.  She told me that she came to the hospital because she was "feeling very spiritual" although she began to cry soon after saying that.  She tells me that on 10 October she started hearing 2 voices.  The voices are almost constant but she cannot tell what they are.  She has interpreted this as being angels and demons talking around her.  She has slept very poorly and cannot concentrate.  Mood is feeling more down and negative although she denies any suicidal thought ideation or plan.  Patient says that she did get her shot of long-acting Invega last Thursday.  She is unclear when she had had it previously although the chart says her mother thinks she was 2 weeks late getting it.  Patient admits also that she has been using drugs more heavily.  She tells me that she has been using heroin daily snorting it.  Also using methamphetamine a couple times a week..  Drug screen actually is positive for amphetamines.  Also using marijuana intermittently. Associated Signs/Symptoms: Depression Symptoms:  depressed mood, anhedonia, psychomotor retardation, difficulty concentrating, hopelessness, anxiety, disturbed sleep, (Hypo) Manic Symptoms:  Distractibility, Anxiety Symptoms:  Excessive Worry, Psychotic Symptoms:  Hallucinations: Auditory Paranoia, PTSD Symptoms: Negative Total Time spent with patient: 1 hour  Past Psychiatric History: Patient has a history of schizoaffective disorder has had several prior hospitalizations.  This  seems to be an escalation in her substance abuse.  Previously had been using marijuana but I do not think anything stronger.  [facility] and was stable for quite a while on her long-acting Invega injection.  Not sure if current decompensation is a change in the illness or just caused by the substance abuse.  No past suicide attempts.  Is the patient at risk to self? Yes.    Has the patient been a risk to self in the past 6 months? Yes.    Has the patient been a risk to self within the distant past? Yes.    Is the patient a risk to others? No.  Has the patient been a risk to others in the past 6 months? No.  Has the patient been a risk to others within the distant past? No.   Prior Inpatient Therapy:   Prior Outpatient Therapy:    Alcohol Screening: 1. How often do you have a drink containing alcohol?: Monthly or less 2. How many drinks containing alcohol do you have on a typical day when you are drinking?: 1 or 2 3. How often do you have six or more drinks on one occasion?: Never AUDIT-C Score: 1 4. How often during the last year have you found that you were not able to stop drinking once you had started?: Never 5. How often during the last year have you failed to do what was normally expected from you becasue of drinking?: Never 6. How often during the last year have you needed a first drink in the morning to get yourself going after a heavy drinking session?: Never 7. How often during the last year have you had a feeling of  guilt of remorse after drinking?: Never 8. How often during the last year have you been unable to remember what happened the night before because you had been drinking?: Never 9. Have you or someone else been injured as a result of your drinking?: No 10. Has a relative or friend or a doctor or another health worker been concerned about your drinking or suggested you cut down?: No Alcohol Use Disorder Identification Test Final Score (AUDIT):  1 Alcohol Brief Interventions/Follow-up: AUDIT Score <7 follow-up not indicated, Continued Monitoring Substance Abuse History in the last 12 months:  Yes.   Consequences of Substance Abuse: Medical Consequences:  Clear worsening of her bipolar disorder Previous Psychotropic Medications: Yes  Psychological Evaluations: Yes  Past Medical History:  Past Medical History:  Diagnosis Date  . Abnormal vaginal Pap smear   . Anxiety   . Bipolar disorder (HCC)   . Depression   . Migraine without aura   . Polycystic ovary syndrome   . Psychosis (HCC)   . Schizophrenia (HCC)   . STD (sexually transmitted disease) 10-14-15   chlamydia    History reviewed. No pertinent surgical history. Family History:  Family History  Problem Relation Age of Onset  . Hypertension Mother   . Heart disease Mother   . Cancer Mother   . Hypertension Other   . Depression Father   . Drug abuse Father   . Bipolar disorder Father   . Heart disease Maternal Grandmother   . Pancreatitis Maternal Grandfather   . Cancer Paternal Grandmother   . Cancer Paternal Grandfather    Family Psychiatric  History: None reported Tobacco Screening: Have you used any form of tobacco in the last 30 days? (Cigarettes, Smokeless Tobacco, Cigars, and/or Pipes): Yes Tobacco use, Select all that apply: 5 or more cigarettes per day Are you interested in Tobacco Cessation Medications?: Yes, will notify MD for an order Counseled patient on smoking cessation including recognizing danger situations, developing coping skills and basic information about quitting provided: Refused/Declined practical counseling Social History:  Social History   Substance and Sexual Activity  Alcohol Use Yes  . Alcohol/week: 0.0 standard drinks     Social History   Substance and Sexual Activity  Drug Use Yes  . Frequency: 2.0 times per week  . Types: Marijuana, Methamphetamines, Cocaine   Comment: meth, heroin, crack    Additional Social History:                            Allergies:  No Known Allergies Lab Results:  Results for orders placed or performed during the hospital encounter of 03/15/19 (from the past 48 hour(s))  Comprehensive metabolic panel     Status: Abnormal   Collection Time: 03/15/19  5:33 PM  Result Value Ref Range   Sodium 139 135 - 145 mmol/L   Potassium 3.4 (L) 3.5 - 5.1 mmol/L   Chloride 105 98 - 111 mmol/L   CO2 24 22 - 32 mmol/L   Glucose, Bld 143 (H) 70 - 99 mg/dL   BUN 13 6 - 20 mg/dL   Creatinine, Ser 1.49 0.44 - 1.00 mg/dL   Calcium 9.6 8.9 - 70.2 mg/dL   Total Protein 8.7 (H) 6.5 - 8.1 g/dL   Albumin 5.0 3.5 - 5.0 g/dL   AST 21 15 - 41 U/L   ALT 18 0 - 44 U/L   Alkaline Phosphatase 127 (H) 38 - 126 U/L   Total Bilirubin 0.8  0.3 - 1.2 mg/dL   GFR calc non Af Amer >60 >60 mL/min   GFR calc Af Amer >60 >60 mL/min   Anion gap 10 5 - 15    Comment: Performed at [facility], [address] [place], [place] [address]  Ethanol     Status: None   Collection Time: 03/15/19  5:33 PM  Result Value Ref Range   Alcohol, Ethyl (B) <10 <10 mg/dL    Comment: (NOTE) Lowest detectable limit for serum alcohol is 10 mg/dL. For medical purposes only. Performed at [facility], [address] [place], [place] [address]   cbc     Status: Abnormal   Collection Time: 03/15/19  5:33 PM  Result Value Ref Range   WBC 11.3 (H) 4.0 - 10.5 K/uL   RBC 5.06 3.87 - 5.11 MIL/uL   Hemoglobin 15.3 (H) 12.0 - 15.0 g/dL   HCT 78.2 95.6 - 21.3 %   MCV 89.1 80.0 - 100.0 fL   MCH 30.2 26.0 - 34.0 pg   MCHC 33.9 30.0 - 36.0 g/dL   RDW 08.6 57.8 - 46.9 %   Platelets 386 150 - 400 K/uL   nRBC 0.0 0.0 - 0.2 %    Comment: Performed at [facility], [address], [place], [place] [address]  Urine Drug Screen, Qualitative     Status: Abnormal   Collection Time: 03/15/19  5:34 PM  Result Value Ref Range   Tricyclic, Ur Screen NONE DETECTED NONE DETECTED   Amphetamines, Ur Screen  POSITIVE (A) NONE DETECTED   MDMA (Ecstasy)Ur Screen NONE DETECTED NONE DETECTED   Cocaine Metabolite,Ur [place] NONE DETECTED NONE DETECTED   Opiate, Ur Screen NONE DETECTED NONE DETECTED   Phencyclidine (PCP) Ur S NONE DETECTED NONE DETECTED   Cannabinoid 50 Ng, Ur [place] POSITIVE (A) NONE DETECTED   Barbiturates, Ur Screen NONE DETECTED NONE DETECTED   Benzodiazepine, Ur Scrn POSITIVE (A) NONE DETECTED   Methadone Scn, Ur NONE DETECTED NONE DETECTED    Comment: (NOTE) Tricyclics + metabolites, urine    Cutoff 1000 ng/mL Amphetamines + metabolites, urine  Cutoff 1000 ng/mL MDMA (Ecstasy), urine              Cutoff 500 ng/mL Cocaine Metabolite, urine          Cutoff 300 ng/mL Opiate + metabolites, urine        Cutoff 300 ng/mL Phencyclidine (PCP), urine         Cutoff 25 ng/mL Cannabinoid, urine                 Cutoff 50 ng/mL Barbiturates + metabolites, urine  Cutoff 200 ng/mL Benzodiazepine, urine              Cutoff 200 ng/mL Methadone, urine                   Cutoff 300 ng/mL The urine drug screen provides only a preliminary, unconfirmed analytical test result and should not be used for non-medical purposes. Clinical consideration and professional judgment should be applied to any positive drug screen result due to possible interfering substances. A more specific alternate chemical method must be used in order to obtain a confirmed analytical result. Gas chromatography / mass spectrometry (GC/MS) is the preferred confirmat ory method. Performed at [facility], [address], [place], [place] [address]   Pregnancy, urine POC     Status: None   Collection Time: 03/15/19  5:54 PM  Result  Value Ref Range   Preg Test, Ur NEGATIVE NEGATIVE    Comment:        THE SENSITIVITY OF THIS METHODOLOGY IS >24 mIU/mL   SARS Coronavirus 2 by RT PCR (hospital order, performed in [facility] lab) Nasopharyngeal Nasopharyngeal Swab     Status: None   Collection Time: 03/15/19   9:10 PM   Specimen: Nasopharyngeal Swab  Result Value Ref Range   SARS Coronavirus 2 NEGATIVE NEGATIVE    Comment: (NOTE) If result is NEGATIVE SARS-CoV-2 target nucleic acids are NOT DETECTED. The SARS-CoV-2 RNA is generally detectable in upper and lower  respiratory specimens during the acute phase of infection. The lowest  concentration of SARS-CoV-2 viral copies this assay can detect is 250  copies / mL. A negative result does not preclude SARS-CoV-2 infection  and should not be used as the sole basis for treatment or other  patient management decisions.  A negative result may occur with  improper specimen collection / handling, submission of specimen other  than nasopharyngeal swab, presence of viral mutation(s) within the  areas targeted by this assay, and inadequate number of viral copies  (<250 copies / mL). A negative result must be combined with clinical  observations, patient history, and epidemiological information. If result is POSITIVE SARS-CoV-2 target nucleic acids are DETECTED. The SARS-CoV-2 RNA is generally detectable in upper and lower  respiratory specimens dur ing the acute phase of infection.  Positive  results are indicative of active infection with SARS-CoV-2.  Clinical  correlation with patient history and other diagnostic information is  necessary to determine patient infection status.  Positive results do  not rule out bacterial infection or co-infection with other viruses. If result is PRESUMPTIVE POSTIVE SARS-CoV-2 nucleic acids MAY BE PRESENT.   A presumptive positive result was obtained on the submitted specimen  and confirmed on repeat testing.  While 2019 novel coronavirus  (SARS-CoV-2) nucleic acids may be present in the submitted sample  additional confirmatory testing may be necessary for epidemiological  and / or clinical management purposes  to differentiate between  SARS-CoV-2 and other Sarbecovirus currently known to infect humans.  If  clinically indicated additional testing with an alternate test  methodology [phone]([id number]) is advised. The SARS-CoV-2 RNA is generally  detectable in upper and lower respiratory sp ecimens during the acute  phase of infection. The expected result is Negative. Fact Sheet for Patients:  BoilerBrush.com.cyhttps://www.fda.gov/media/136312/download Fact Sheet for Healthcare Providers: https://pope.com/https://www.fda.gov/media/136313/download This test is not yet approved or cleared by the [place] FDA and has been authorized for detection and/or diagnosis of SARS-CoV-2 by FDA under an Emergency Use Authorization (EUA).  This EUA will remain in effect (meaning this test can be used) for the duration of the COVID-19 declaration under Section 564(b)(1) of the Act, 21 U.S.C. section 360bbb-3(b)(1), unless the authorization is terminated or revoked sooner. Performed at [facility], [address] [place], [place] [address]     Blood Alcohol level:  Lab Results  Component Value Date   [facility] <10 03/15/2019   ETH <10 06/29/2018    Metabolic Disorder Labs:  Lab Results  Component Value Date   HGBA1C 5.2 07/01/2018   MPG 102.54 07/01/2018   MPG 99.67 10/19/2017   Lab Results  Component Value Date   PROLACTIN 73.5 (H) 10/19/2017   PROLACTIN 4.0 10/14/2015   Lab Results  Component Value Date   CHOL 147 07/01/2018   TRIG 54 07/01/2018   HDL 45 07/01/2018   CHOLHDL  3.3 07/01/2018   VLDL 11 07/01/2018   LDLCALC 91 07/01/2018   LDLCALC 86 10/19/2017    Current Medications: Current Facility-Administered Medications  Medication Dose Route Frequency Provider Last Rate Last Dose  . acetaminophen (TYLENOL) tablet 650 mg  650 mg Oral Q6H PRN Gillermo Murdoch, NP      . alum & mag hydroxide-simeth (MAALOX/MYLANTA) 200-200-20 MG/5ML suspension 30 mL  30 mL Oral Q4H PRN Gillermo Murdoch, NP      . benztropine (COGENTIN) tablet 1 mg  1 mg Oral BID Gillermo Murdoch, NP      . magnesium hydroxide (MILK  OF MAGNESIA) suspension 30 mL  30 mL Oral Daily PRN Gillermo Murdoch, NP      . Melene Muller ON 03/17/2019] nicotine (NICODERM CQ - dosed in mg/24 hours) patch 21 mg  21 mg Transdermal Q0600 Gillermo Murdoch, NP      . OLANZapine zydis (ZYPREXA) disintegrating tablet 5 mg  5 mg Oral QHS Gillermo Murdoch, NP      . traZODone (DESYREL) tablet 100 mg  100 mg Oral QHS Gillermo Murdoch, NP       PTA Medications: Medications Prior to Admission  Medication Sig Dispense Refill Last Dose  . benztropine (COGENTIN) 1 MG tablet Take 1 tablet (1 mg total) by mouth 2 (two) times daily. (Patient not taking: Reported on 03/16/2019) 60 tablet 1   . buprenorphine-naloxone (SUBOXONE) 8-2 mg SUBL SL tablet Place 0.5 tablets under the tongue 2 (two) times daily.     . paliperidone (INVEGA SUSTENNA) 234 MG/1.5ML SUSY injection Inject 234 mg into the muscle every 28 (twenty-eight) days. Next injection on 06/29/2018 1.8 mL 1   . traZODone (DESYREL) 100 MG tablet Take 1 tablet (100 mg total) by mouth at bedtime. (Patient not taking: Reported on 03/16/2019) 30 tablet 1     Musculoskeletal: Strength & Muscle Tone: within normal limits Gait & Station: normal Patient leans: N/A  Psychiatric Specialty Exam: Physical Exam  Nursing note and vitals reviewed. Constitutional: She appears well-developed and well-nourished.  HENT:  Head: Normocephalic and atraumatic.  Eyes: Pupils are equal, round, and reactive to light. Conjunctivae are normal.  Neck: Normal range of motion.  Cardiovascular: Regular rhythm and normal heart sounds.  Respiratory: Effort normal. No respiratory distress.  GI: Soft.  Musculoskeletal: Normal range of motion.  Neurological: She is alert.  Skin: Skin is warm and dry.  Psychiatric: Judgment normal. Her mood appears anxious. Her affect is blunt. Her speech is delayed. She is slowed. Thought content is paranoid. Cognition and memory are impaired. She expresses no suicidal ideation.     Review of Systems  Constitutional: Negative.   HENT: Negative.   Eyes: Negative.   Respiratory: Negative.   Cardiovascular: Negative.   Gastrointestinal: Negative.   Musculoskeletal: Negative.   Skin: Negative.   Neurological: Negative.   Psychiatric/Behavioral: Positive for depression, hallucinations and substance abuse. Negative for suicidal ideas. The patient is nervous/anxious and has insomnia.     Blood pressure 117/73, pulse 77, temperature 98.4 F (36.9 C), temperature source Oral, resp. rate 16, height 5' 3.5" (1.613 m), weight 91.2 kg, SpO2 100 %.Body mass index is 35.05 kg/m.  General Appearance: Casual  Eye Contact:  Good  Speech:  Clear and Coherent  Volume:  Normal  Mood:  Depressed  Affect:  Congruent, Constricted and Tearful  Thought Process:  Coherent  Orientation:  Full (Time, Place, and Person)  Thought Content:  Rumination and Tangential  Suicidal Thoughts:  No  Homicidal Thoughts:  No  Memory:  Immediate;   Fair Recent;   Fair Remote;   Fair  Judgement:  Impaired  Insight:  Shallow  Psychomotor Activity:  Decreased  Concentration:  Concentration: Fair  Recall:  [employer] of Knowledge:  Fair  Language:  Poor  Akathisia:  No  Handed:  Right  AIMS (if indicated):     Assets:  Desire for Improvement Housing Leisure Time Physical Health  ADL's:  Intact  Cognition:  WNL  Sleep:       Treatment Plan Summary: Daily contact with patient to assess and evaluate symptoms and progress in treatment, Medication management and Plan Patient with a history of schizoaffective disorder admitted with the emergence of psychosis.  Start back oral Invega in addition to the injection she got.  15-minute checks.  Engage in individual and group therapy.  Reassess daily for suicidality for hallucinations for symptoms.  Work on improving insight about substance abuse.  Observation Level/Precautions:  15 minute checks  Laboratory:  Chemistry Profile  Psychotherapy:     Medications:    Consultations:    Discharge Concerns:    Estimated LOS:  Other:     Physician Treatment Plan for Primary Diagnosis: Schizoaffective disorder, bipolar type ([place]) Long Term Goal(s): Improvement in symptoms so as ready for discharge  Short Term Goals: Ability to maintain clinical measurements within normal limits will improve and Compliance with prescribed medications will improve  Physician Treatment Plan for Secondary Diagnosis: Principal Problem:   Schizoaffective disorder, bipolar type ([place])  Long Term Goal(s): Improvement in symptoms so as ready for discharge  Short Term Goals: Ability to demonstrate self-control will improve and Ability to identify triggers associated with substance abuse/mental health issues will improve  I certify that inpatient services furnished can reasonably be expected to improve the patient's condition.    [name], MD 10/20/20201:47 PM

## 2019-03-16 NOTE — Progress Notes (Signed)
Admission Note:Report from  [facility] from ER  D: Pt appeared depressed  With  a flat affect.  Pt  denies SI / AVH at this time. Pt was receptive to education about the milieu .  15 min safety checks started. Probation officer offered support 23 year old female in under the services of Dr. Norville Haggard . Patient presents with bizarre behavior . Patient non complaint  with medications . Missed her Invega injection 2 weeks ago. Patient reports   auditory hallucinations but unable to   say what they are saying . Patient religious preoccupied. attended Mass 3 days ago.  Stated she likes to get naked to feel the spirit.  Patient is a poly substance abuser. Patient reports using Heroin, Crack and Marijuana.  Patient lab works shows   Amphetamines  Benzothiazines  and THC  Pt is redirectable and cooperative with assessment.     Medical History: Anxiety  Bipolars , Depression,  Polycystic, Ovarian Syndrome , Psychosis , Schizophrenia Allergies: NKA      A: Pt admitted to unit per protocol, skin assessment intact and search done and  With no contraband found with Demetria  RN .  Pt  educated on therapeutic milieu rules. Pt was introduced to milieu by nursing staff.    R: Pt was receptive to education about the milieu .  15 min safety checks started. Probation officer offered support.

## 2019-03-16 NOTE — ED Notes (Signed)
Patient has to be redirected to stay in her room, if she wants to be naked, informed patient that we have males on the unit and she cant keep coming out with no clothing on

## 2019-03-16 NOTE — Tx Team (Signed)
Initial Treatment Plan 03/16/2019 1:21 PM Theresa [name] WPV:[id number]    PATIENT STRESSORS: Medication change or noncompliance Substance abuse   PATIENT STRENGTHS: Communication skills General fund of knowledge   PATIENT IDENTIFIED PROBLEMS: Noncompliant with medications  Religiously preoccupied  Anxiety  Polysubstance abuse               DISCHARGE CRITERIA:  Ability to meet basic life and health needs Improved stabilization in mood, thinking, and/or behavior Need for constant or close observation no longer present Reduction of life-threatening or endangering symptoms to within safe limits  PRELIMINARY DISCHARGE PLAN: Outpatient therapy Return to previous living arrangement  PATIENT/FAMILY INVOLVEMENT: This treatment plan has been presented to and reviewed with the patient, Theresa [name]. The patient has been given the opportunity to ask questions and make suggestions.  Theresa Tolsma, RN 03/16/2019, 1:21 PM

## 2019-03-16 NOTE — Plan of Care (Signed)
New admission.  Problem: Education: Goal: Knowledge of [facility] Education information/materials will improve Outcome: Not Progressing Goal: Emotional status will improve Outcome: Not Progressing Goal: Mental status will improve Outcome: Not Progressing Goal: Verbalization of understanding the information provided will improve Outcome: Not Progressing   Problem: Health Behavior/Discharge Planning: Goal: Compliance with treatment plan for underlying cause of condition will improve Outcome: Not Progressing   Problem: Safety: Goal: Periods of time without injury will increase Outcome: Not Progressing   Problem: Education: Goal: Will be free of psychotic symptoms Outcome: Not Progressing Goal: Knowledge of the prescribed therapeutic regimen will improve Outcome: Not Progressing   Problem: Health Behavior/Discharge Planning: Goal: Compliance with prescribed medication regimen will improve Outcome: Not Progressing   Problem: Self-Concept: Goal: Ability to identify factors that promote anxiety will improve Outcome: Not Progressing Goal: Level of anxiety will decrease Outcome: Not Progressing Goal: Ability to modify response to factors that promote anxiety will improve Outcome: Not Progressing

## 2019-03-16 NOTE — ED Notes (Signed)
Hourly rounding reveals patient in room. No complaints, stable, in no acute distress. Q15 minute rounds and monitoring via Security Cameras to continue. 

## 2019-03-16 NOTE — ED Notes (Signed)
Hourly rounding reveals patient in day room. No complaints, stable, in no acute distress. Q15 minute rounds and monitoring via Security Cameras to continue. 

## 2019-03-16 NOTE — B[facility] Group Notes (Signed)
Balance In Life 03/16/2019 1PM  Type of Therapy/Topic:  Group Therapy:  Balance in Life  Participation Level:  Did Not Attend  Description of Group:   This group will address the concept of balance and how it feels and looks when one is unbalanced. Patients will be encouraged to process areas in their lives that are out of balance and identify reasons for remaining unbalanced. Facilitators will guide patients in utilizing problem-solving interventions to address and correct the stressor making their life unbalanced. Understanding and applying boundaries will be explored and addressed for obtaining and maintaining a balanced life. Patients will be encouraged to explore ways to assertively make their unbalanced needs known to significant others in their lives, using other group members and facilitator for support and feedback.  Therapeutic Goals: 1. Patient will identify two or more emotions or situations they have that consume much of in their lives. 2. Patient will identify signs/triggers that life has become out of balance:  3. Patient will identify two ways to set boundaries in order to achieve balance in their lives:  4. Patient will demonstrate ability to communicate their needs through discussion and/or role plays  Summary of Patient Progress:    Therapeutic Modalities:   Cognitive Behavioral Therapy Solution-Focused Therapy Assertiveness Training  Lux Meaders T Gradie Ohm, LCSW  

## 2019-03-17 MED ORDER — PALIPERIDONE ER 3 MG PO TB24
6.0000 mg | ORAL_TABLET | Freq: Every day | ORAL | Status: DC
  Administered 2019-03-17 – 2019-03-18 (×2): 6 mg via ORAL
  Filled 2019-03-17 (×2): qty 2

## 2019-03-17 NOTE — Progress Notes (Signed)
D:Altered thought Process  A: Patient stated slept good last night .Stated appetite good and energy level  normal. Stated concentration is good . Stated on Depression scale  0, hopeless 0 and anxiety 0.( low 0-10 high) Denies suicidal  homicidal ideations  . Denies auditory hallucinations . No pain concerns . Appropriate ADL'S. Limited  Interacting with peers and staff.   Encourage patient participation with unit programming . Instruction  Given on  Medication , verbalize understanding.Patient  knowledgeable of information  received , understanding of unit  programing ,  Emotional and /mental status improving . Voice of no safety concerns . Encourage  patient  participation to work on coping , decision making , and anxiety.   Able to identify resources  to meet needs. Thought process remained  altered   R: Voice no other concerns. Staff continue to monitor.

## 2019-03-17 NOTE — Progress Notes (Signed)
Recreation Therapy Notes  INPATIENT RECREATION THERAPY ASSESSMENT  Patient Details Name: Theresa [name] MRN: [id number] DOB: [birth year] Today's Date: 03/17/2019       Information Obtained From: Patient  Able to Participate in Assessment/Interview: Yes  Patient Presentation: Responsive  Reason for Admission (Per Patient): Active Symptoms, Substance Abuse  Patient Stressors:    Coping Skills:   Substance Abuse, Music, Prayer  Leisure Interests (2+):  Art - Coloring(Pray)  Frequency of Recreation/Participation:    Awareness of Community Resources:     [employer]:     Current Use:    If no, Barriers?:    Expressed Interest in [employer] Information:    [place] of Residence:  [employer]  Patient Main Form of Transportation: Musician  Patient Strengths:  Engineer, structural the beauty of people  Patient Identified Areas of Improvement:  Let the past be the past and let things go.  Patient Goal for Hospitalization:  Be better  Current SI (including self-harm):  No  Current HI:  No  Current AVH: No  Staff Intervention Plan: Group Attendance, Collaborate with Interdisciplinary Treatment Team  Consent to Intern Participation: N/A  Theresa [name] 03/17/2019, 11:57 AM

## 2019-03-17 NOTE — Progress Notes (Signed)
[facility]  03/17/2019 4:17 PM Theresa [name]  MRN:  [id number] Subjective: Follow-up for this young woman with schizoaffective disorder and substance abuse.  Patient seen chart reviewed.  She is neatly dressed and groomed and is interacting on the ward appropriately.  She says that earlier in the day she was having auditory hallucinations but they have improved in the afternoon.  She slept reasonably well last night.  Not complaining of any physical side effects.  Showing insight into the importance of staying off substances of abuse in the future.  Denies any suicidal thought. Principal Problem: Schizoaffective disorder, bipolar type (HCC) Diagnosis: Principal Problem:   Schizoaffective disorder, bipolar type (HCC)  Total Time spent with patient: 20 minutes  Past Psychiatric History: Past history of schizoaffective disorder and substance abuse problems.  It looks like the methamphetamine is a more recent addition  Past Medical History:  Past Medical History:  Diagnosis Date  . Abnormal vaginal Pap smear   . Anxiety   . Bipolar disorder (HCC)   . Depression   . Migraine without aura   . Polycystic ovary syndrome   . Psychosis (HCC)   . Schizophrenia (HCC)   . STD (sexually transmitted disease) 10-14-15   chlamydia    History reviewed. No pertinent surgical history. Family History:  Family History  Problem Relation Age of Onset  . Hypertension Mother   . Heart disease Mother   . Cancer Mother   . Hypertension Other   . Depression Father   . Drug abuse Father   . Bipolar disorder Father   . Heart disease Maternal Grandmother   . Pancreatitis Maternal Grandfather   . Cancer Paternal Grandmother   . Cancer Paternal Grandfather    Family Psychiatric  History: See previous Social History:  Social History   Substance and Sexual Activity  Alcohol Use Yes  . Alcohol/week: 0.0 standard drinks     Social History   Substance and Sexual Activity  Drug Use Yes   . Frequency: 2.0 times per week  . Types: Marijuana, Methamphetamines, Cocaine   Comment: meth, heroin, crack    Social History   Socioeconomic History  . Marital status: Legally Separated    Spouse name: Not on file  . Number of children: Not on file  . Years of education: Not on file  . Highest education level: Not on file  Occupational History  . Not on file  Social Needs  . Financial resource strain: Not on file  . Food insecurity    Worry: Not on file    Inability: Not on file  . Transportation needs    Medical: Not on file    Non-medical: Not on file  Tobacco Use  . Smoking status: Current Every Day Smoker    Packs/day: 0.50    Types: Cigarettes  . Smokeless tobacco: Never Used  Substance and Sexual Activity  . Alcohol use: Yes    Alcohol/week: 0.0 standard drinks  . Drug use: Yes    Frequency: 2.0 times per week    Types: Marijuana, Methamphetamines, Cocaine    Comment: meth, heroin, crack  . Sexual activity: Yes    Partners: Male    Birth control/protection: None  Lifestyle  . Physical activity    Days per week: Not on file    Minutes per session: Not on file  . Stress: Not on file  Relationships  . Social Musicianconnections    Talks on phone: Not on file    Gets  together: Not on file    Attends religious service: Not on file    Active member of club or organization: Not on file    Attends meetings of clubs or organizations: Not on file    Relationship status: Not on file  Other Topics Concern  . Not on file  Social History Narrative  . Not on file   Additional Social History:                         Sleep: Fair  Appetite:  Fair  Current Medications: Current Facility-Administered Medications  Medication Dose Route Frequency Provider Last Rate Last Dose  . acetaminophen (TYLENOL) tablet 650 mg  650 mg Oral Q6H PRN Gillermo Murdoch, NP      . alum & mag hydroxide-simeth (MAALOX/MYLANTA) 200-200-20 MG/5ML suspension 30 mL  30 mL Oral Q4H  PRN Gillermo Murdoch, NP      . benztropine (COGENTIN) tablet 1 mg  1 mg Oral BID Gillermo Murdoch, NP   1 mg at 03/17/19 1607  . magnesium hydroxide (MILK OF MAGNESIA) suspension 30 mL  30 mL Oral Daily PRN Gillermo Murdoch, NP      . nicotine (NICODERM CQ - dosed in mg/24 hours) patch 21 mg  21 mg Transdermal Q0600 Gillermo Murdoch, NP      . paliperidone (INVEGA) 24 hr tablet 6 mg  6 mg Oral Q supper Rimas Gilham, Jackquline Denmark, MD      . traZODone (DESYREL) tablet 100 mg  100 mg Oral QHS Gillermo Murdoch, NP   100 mg at 03/16/19 2128    Lab Results:  Results for orders placed or performed during the hospital encounter of 03/15/19 (from the past 48 hour(s))  Comprehensive metabolic panel     Status: Abnormal   Collection Time: 03/15/19  5:33 PM  Result Value Ref Range   Sodium 139 135 - 145 mmol/L   Potassium 3.4 (L) 3.5 - 5.1 mmol/L   Chloride 105 98 - 111 mmol/L   CO2 24 22 - 32 mmol/L   Glucose, Bld 143 (H) 70 - 99 mg/dL   BUN 13 6 - 20 mg/dL   Creatinine, Ser 2.87 0.44 - 1.00 mg/dL   Calcium 9.6 8.9 - 68.1 mg/dL   Total Protein 8.7 (H) 6.5 - 8.1 g/dL   Albumin 5.0 3.5 - 5.0 g/dL   AST 21 15 - 41 U/L   ALT 18 0 - 44 U/L   Alkaline Phosphatase 127 (H) 38 - 126 U/L   Total Bilirubin 0.8 0.3 - 1.2 mg/dL   GFR calc non Af Amer >60 >60 mL/min   GFR calc Af Amer >60 >60 mL/min   Anion gap 10 5 - 15    Comment: Performed at [facility], [address], [place], [place] [address]  Ethanol     Status: None   Collection Time: 03/15/19  5:33 PM  Result Value Ref Range   Alcohol, Ethyl (B) <10 <10 mg/dL    Comment: (NOTE) Lowest detectable limit for serum alcohol is 10 mg/dL. For medical purposes only. Performed at [facility], [address] [place], [place] [address]   cbc     Status: Abnormal   Collection Time: 03/15/19  5:33 PM  Result Value Ref Range   WBC 11.3 (H) 4.0 - 10.5 K/uL   RBC 5.06 3.87 - 5.11 MIL/uL   Hemoglobin 15.3 (H) 12.0 -  15.0 g/dL   HCT 97.4 16.3 - 84.5 %  MCV 89.1 80.0 - 100.0 fL   MCH 30.2 26.0 - 34.0 pg   MCHC 33.9 30.0 - 36.0 g/dL   RDW 12.3 11.5 - 15.5 %   Platelets 386 150 - 400 K/uL   nRBC 0.0 0.0 - 0.2 %    Comment: Performed at [facility], [address], [place], [place] [address]  Urine Drug Screen, Qualitative     Status: Abnormal   Collection Time: 03/15/19  5:34 PM  Result Value Ref Range   Tricyclic, Ur Screen NONE DETECTED NONE DETECTED   Amphetamines, Ur Screen POSITIVE (A) NONE DETECTED   MDMA (Ecstasy)Ur Screen NONE DETECTED NONE DETECTED   Cocaine Metabolite,Ur [place] NONE DETECTED NONE DETECTED   Opiate, Ur Screen NONE DETECTED NONE DETECTED   Phencyclidine (PCP) Ur S NONE DETECTED NONE DETECTED   Cannabinoid 50 Ng, Ur [place] POSITIVE (A) NONE DETECTED   Barbiturates, Ur Screen NONE DETECTED NONE DETECTED   Benzodiazepine, Ur Scrn POSITIVE (A) NONE DETECTED   Methadone Scn, Ur NONE DETECTED NONE DETECTED    Comment: (NOTE) Tricyclics + metabolites, urine    Cutoff 1000 ng/mL Amphetamines + metabolites, urine  Cutoff 1000 ng/mL MDMA (Ecstasy), urine              Cutoff 500 ng/mL Cocaine Metabolite, urine          Cutoff 300 ng/mL Opiate + metabolites, urine        Cutoff 300 ng/mL Phencyclidine (PCP), urine         Cutoff 25 ng/mL Cannabinoid, urine                 Cutoff 50 ng/mL Barbiturates + metabolites, urine  Cutoff 200 ng/mL Benzodiazepine, urine              Cutoff 200 ng/mL Methadone, urine                   Cutoff 300 ng/mL The urine drug screen provides only a preliminary, unconfirmed analytical test result and should not be used for non-medical purposes. Clinical consideration and professional judgment should be applied to any positive drug screen result due to possible interfering substances. A more specific alternate chemical method must be used in order to obtain a confirmed analytical result. Gas chromatography / mass spectrometry (GC/MS) is the  preferred confirmat ory method. Performed at [facility], [address] [place], [place] [address]   Pregnancy, urine POC     Status: None   Collection Time: 03/15/19  5:54 PM  Result Value Ref Range   Preg Test, Ur NEGATIVE NEGATIVE    Comment:        THE SENSITIVITY OF THIS METHODOLOGY IS >24 mIU/mL   SARS Coronavirus 2 by RT PCR (hospital order, performed in [place] hospital lab) Nasopharyngeal Nasopharyngeal Swab     Status: None   Collection Time: 03/15/19  9:10 PM   Specimen: Nasopharyngeal Swab  Result Value Ref Range   SARS Coronavirus 2 NEGATIVE NEGATIVE    Comment: (NOTE) If result is NEGATIVE SARS-CoV-2 target nucleic acids are NOT DETECTED. The SARS-CoV-2 RNA is generally detectable in upper and lower  respiratory specimens during the acute phase of infection. The lowest  concentration of SARS-CoV-2 viral copies this assay can detect is 250  copies / mL. A negative result does not preclude SARS-CoV-2 infection  and should not be used as the sole basis for treatment or other  patient management decisions.  A negative result may occur  with  improper specimen collection / handling, submission of specimen other  than nasopharyngeal swab, presence of viral mutation(s) within the  areas targeted by this assay, and inadequate number of viral copies  (<250 copies / mL). A negative result must be combined with clinical  observations, patient history, and epidemiological information. If result is POSITIVE SARS-CoV-2 target nucleic acids are DETECTED. The SARS-CoV-2 RNA is generally detectable in upper and lower  respiratory specimens dur ing the acute phase of infection.  Positive  results are indicative of active infection with SARS-CoV-2.  Clinical  correlation with patient history and other diagnostic information is  necessary to determine patient infection status.  Positive results do  not rule out bacterial infection or co-infection with other  viruses. If result is PRESUMPTIVE POSTIVE SARS-CoV-2 nucleic acids MAY BE PRESENT.   A presumptive positive result was obtained on the submitted specimen  and confirmed on repeat testing.  While 2019 novel coronavirus  (SARS-CoV-2) nucleic acids may be present in the submitted sample  additional confirmatory testing may be necessary for epidemiological  and / or clinical management purposes  to differentiate between  SARS-CoV-2 and other Sarbecovirus currently known to infect humans.  If clinically indicated additional testing with an alternate test  methodology [phone]) is advised. The SARS-CoV-2 RNA is generally  detectable in upper and lower respiratory sp ecimens during the acute  phase of infection. The expected result is Negative. Fact Sheet for Patients:  BoilerBrush.com.cy Fact Sheet for Healthcare Providers: https://pope.com/ This test is not yet approved or cleared by the [place] FDA and has been authorized for detection and/or diagnosis of SARS-CoV-2 by FDA under an Emergency Use Authorization (EUA).  This EUA will remain in effect (meaning this test can be used) for the duration of the COVID-19 declaration under Section 564(b)(1) of the Act, 21 U.S.C. section 360bbb-3(b)(1), unless the authorization is terminated or revoked sooner. Performed at [facility], [address] [place], [place] [address]     Blood Alcohol level:  Lab Results  Component Value Date   [facility] <10 03/15/2019   ETH <10 06/29/2018    Metabolic Disorder Labs: Lab Results  Component Value Date   HGBA1C 5.2 07/01/2018   MPG 102.54 07/01/2018   MPG 99.67 10/19/2017   Lab Results  Component Value Date   PROLACTIN 73.5 (H) 10/19/2017   PROLACTIN 4.0 10/14/2015   Lab Results  Component Value Date   CHOL 147 07/01/2018   TRIG 54 07/01/2018   HDL 45 07/01/2018   CHOLHDL 3.3 07/01/2018   VLDL 11 07/01/2018   LDLCALC 91  07/01/2018   LDLCALC 86 10/19/2017    Physical Findings: AIMS:  , ,  ,  ,    CIWA:    COWS:     Musculoskeletal: Strength & Muscle Tone: within normal limits Gait & Station: normal Patient leans: N/A  Psychiatric Specialty Exam: Physical Exam  Nursing note and vitals reviewed. Constitutional: She appears well-developed and well-nourished.  HENT:  Head: Normocephalic and atraumatic.  Eyes: Pupils are equal, round, and reactive to light. Conjunctivae are normal.  Neck: Normal range of motion.  Cardiovascular: Regular rhythm and normal heart sounds.  Respiratory: Effort normal.  GI: Soft.  Musculoskeletal: Normal range of motion.  Neurological: She is alert.  Skin: Skin is warm and dry.  Psychiatric: Judgment normal. Her mood appears anxious. Her speech is delayed. She is slowed. Thought content is paranoid. She expresses no homicidal and no suicidal ideation. She exhibits abnormal recent  memory.    Review of Systems  Constitutional: Negative.   HENT: Negative.   Eyes: Negative.   Respiratory: Negative.   Cardiovascular: Negative.   Gastrointestinal: Negative.   Musculoskeletal: Negative.   Skin: Negative.   Neurological: Negative.   Psychiatric/Behavioral: Positive for hallucinations, memory loss and substance abuse. Negative for depression and suicidal ideas. The patient is nervous/anxious. The patient does not have insomnia.     Blood pressure 127/71, pulse 81, temperature 98 F (36.7 C), temperature source Oral, resp. rate 17, height 5' 3.5" (1.613 m), weight 91.2 kg, SpO2 98 %.Body mass index is 35.05 kg/m.  General Appearance: Casual  Eye Contact:  Good  Speech:  Clear and Coherent  Volume:  Normal  Mood:  Euthymic  Affect:  Congruent  Thought Process:  Goal Directed  Orientation:  Full (Time, Place, and Person)  Thought Content:  Logical  Suicidal Thoughts:  No  Homicidal Thoughts:  No  Memory:  Immediate;   Fair Recent;   Fair Remote;   Fair  Judgement:   Fair  Insight:  Fair  Psychomotor Activity:  Normal  Concentration:  Concentration: Fair  Recall:  [employer] of Knowledge:  Fair  Language:  Fair  Akathisia:  No  Handed:  Right  AIMS (if indicated):     Assets:  Desire for Improvement Housing Physical Health Social Support  ADL's:  Intact  Cognition:  Impaired,  Mild  Sleep:  Number of Hours: 6.5     Treatment Plan Summary: Daily contact with patient to assess and evaluate symptoms and progress in treatment, Medication management and Plan Patient still complaining of some short-term memory impairment but seems to be functioning pretty well does not appear at all delirious or confused.  I offered reassurance that I think that the symptoms will improve with time as she gets back into a stable state of mind.  Continue the oral Invega with suppertime.  Reassess over the next couple days and in treatment team possible discharge 2 to 3 days.  Mordecai Rasmussen, MD 03/17/2019, 4:17 PM
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
## 2019-03-17 NOTE — BHH Group Notes (Signed)
LCSW Group Therapy Note  03/17/2019 11:39 AM  Type of Therapy/Topic:  Group Therapy:  Emotion Regulation  Participation Level:  Active   Description of Group:   The purpose of this group is to assist patients in learning to regulate negative emotions and experience positive emotions. Patients will be guided to discuss ways in which they have been vulnerable to their negative emotions. These vulnerabilities will be juxtaposed with experiences of positive emotions or situations, and patients will be challenged to use positive emotions to combat negative ones. Special emphasis will be placed on coping with negative emotions in conflict situations, and patients will process healthy conflict resolution skills.  Therapeutic Goals: 1. Patient will identify two positive emotions or experiences to reflect on in order to balance out negative emotions 2. Patient will label two or more emotions that they find the most difficult to experience 3. Patient will demonstrate positive conflict resolution skills through discussion and/or role plays  Summary of Patient Progress: Pt was appropriate and respectful in group. Pt was able to identify emotion regulation as trying to control your emotions. Pt reported that she was confused prior to coming to the hospital because she was hearing voices and did not know if it was real life or hallucinations. Pt reported that she was having anxiety due to her medication being off track and it messing up her emotions. Pt stated that she likes to pray as a coping skill.   Therapeutic Modalities:   Cognitive Behavioral Therapy Feelings Identification Dialectical Behavioral Therapy   Evalina Field, MSW, LCSW Clinical Social Work 03/17/2019 11:39 AM

## 2019-03-17 NOTE — Plan of Care (Signed)
Pt c/o of feeling irritable due to her situation and generalized pain. Received 650 mg Tylenol  and Trazodone for sleep which were effective. Currently resting in bed comfortably. Will continues to monitor for safety.  Problem: Safety: Goal: Periods of time without injury will increase Outcome: Progressing   Problem: Self-Concept: Goal: Ability to identify factors that promote anxiety will improve Outcome: Progressing

## 2019-03-17 NOTE — Plan of Care (Signed)
Patient  knowledgeable of information  received , understanding of unit  programing ,  Emotional and /mental status improving . Voice of no safety concerns . Encourage  patient  participation to work on coping , decision making , and anxiety.   Able to identify resources  to meet needs. Thought process remained  altered    Problem: Education: Goal: Knowledge of [facility] Education information/materials will improve Outcome: Progressing Goal: Emotional status will improve Outcome: Progressing Goal: Mental status will improve Outcome: Progressing Goal: Verbalization of understanding the information provided will improve Outcome: Progressing   Problem: Health Behavior/Discharge Planning: Goal: Compliance with treatment plan for underlying cause of condition will improve Outcome: Progressing   Problem: Safety: Goal: Periods of time without injury will increase Outcome: Progressing   Problem: Education: Goal: Will be free of psychotic symptoms Outcome: Progressing Goal: Knowledge of the prescribed therapeutic regimen will improve Outcome: Progressing   Problem: Health Behavior/Discharge Planning: Goal: Compliance with prescribed medication regimen will improve Outcome: Progressing   Problem: Self-Concept: Goal: Ability to identify factors that promote anxiety will improve Outcome: Progressing Goal: Level of anxiety will decrease Outcome: Progressing Goal: Ability to modify response to factors that promote anxiety will improve Outcome: Progressing

## 2019-03-17 NOTE — Progress Notes (Signed)
Patient alert and oriented x 4, affect is flat  but she brightens upon approach che denies SI/HI/AVH no distress noted, interacting appropriately with peers and staff, denies SI/HI/AVH,N 15 minutes safety checks maintained will continue  to monitor

## 2019-03-18 NOTE — BHH Group Notes (Signed)
LCSW Group Therapy Note  03/18/2019 1:00 PM  Type of Therapy/Topic:  Group Therapy:  Balance in Life  Participation Level:  None  Description of Group:    This group will address the concept of balance and how it feels and looks when one is unbalanced. Patients will be encouraged to process areas in their lives that are out of balance and identify reasons for remaining unbalanced. Facilitators will guide patients in utilizing problem-solving interventions to address and correct the stressor making their life unbalanced. Understanding and applying boundaries will be explored and addressed for obtaining and maintaining a balanced life. Patients will be encouraged to explore ways to assertively make their unbalanced needs known to significant others in their lives, using other group members and facilitator for support and feedback.  Therapeutic Goals: 1. Patient will identify two or more emotions or situations they have that consume much of in their lives. 2. Patient will identify signs/triggers that life has become out of balance:  3. Patient will identify two ways to set boundaries in order to achieve balance in their lives:  4. Patient will demonstrate ability to communicate their needs through discussion and/or role plays  Summary of Patient Progress: Patient left group as it started and did not return.    Therapeutic Modalities:   Cognitive Behavioral Therapy Solution-Focused Therapy Assertiveness Training  Assunta Curtis MSW, LCSW 03/18/2019 11:31 AM

## 2019-03-18 NOTE — BHH Suicide Risk Assessment (Signed)
[facility] INPATIENT:  Family/Significant Other Suicide Prevention Education  Suicide Prevention Education:  Education Completed; [name], mother 62 [phone] has been identified by the patient as the family member/significant other with whom the patient will be residing, and identified as the person(s) who will aid the patient in the event of a mental health crisis (suicidal ideations/suicide attempt).  With written consent from the patient, the family member/significant other has been provided the following suicide prevention education, prior to the and/or following the discharge of the patient.  The suicide prevention education provided includes the following:  Suicide risk factors  Suicide prevention and interventions  National Suicide Hotline telephone number  [facility] assessment telephone number  [facility] Emergency Assistance [place] and/or Residential Mobile Crisis Unit telephone number  Request made of family/significant other to:  Remove weapons (e.g., guns, rifles, knives), all items previously/currently identified as safety concern.    Remove drugs/medications (over-the-counter, prescriptions, illicit drugs), all items previously/currently identified as a safety concern.  The family member/significant other verbalizes understanding of the suicide prevention education information provided.  The family member/significant other agrees to remove the items of safety concern listed above. Ms. Blanch Media reports the pt was brought to the hospital due to her experiencing AH. She says the pt was late receiving her Invega shot. She states the pt having a history of drug use for the past 9 months and being negatively influenced to use drugs by her boyfriend. Ms. Blanch Media reports the pt has agreed to come and live with her upon discharge from the hospital. She denies the pt having access to guns or weapons.  Mare Ludtke T Neno Hohensee 03/18/2019, 11:11 AM

## 2019-03-18 NOTE — Progress Notes (Signed)
Recreation Therapy Notes  Date:03/18/2019  Time: 9:30 am  Location: Craft room  Behavioral response: Appropriate  Intervention Topic: Emotions  Discussion/Intervention:  Group content on today was focused on emotions. The group identified what emotions are and why it is important to have emotions. Patients expressed some positive and negative emotions. Individuals gave some past experiences on how they normally dealt with emotions in the past. The group described some positive ways to deal with emotions in the future. Patients participated in the intervention "Name the Theresa [name]" where individuals were given a chance to experience different emotions.  Clinical Observations/Feedback:  Patient came to group and defined emotions as, the way you feel. Individual was social with peers and staff while participating in the group intervention.  Theresa [name] LRT/CTRS         Theresa [name] 03/18/2019 11:37 AM

## 2019-03-18 NOTE — Plan of Care (Signed)
Patient  knowledgeable of information  received , understanding of unit  programing ,  Emotional and /mental status improving . Voice of no safety concerns . Encourage  patient  participation to work on coping , decision making , and anxiety.   Able to identify resources  to meet needs. Thought process  improving  Conversation logical   Problem: Education: Goal: Knowledge of [place] Education information/materials will improve Outcome: Progressing Goal: Emotional status will improve Outcome: Progressing Goal: Mental status will improve Outcome: Progressing Goal: Verbalization of understanding the information provided will improve Outcome: Progressing   Problem: Health Behavior/Discharge Planning: Goal: Compliance with treatment plan for underlying cause of condition will improve Outcome: Progressing   Problem: Safety: Goal: Periods of time without injury will increase Outcome: Progressing   Problem: Safety: Goal: Periods of time without injury will increase Outcome: Progressing   Problem: Education: Goal: Will be free of psychotic symptoms Outcome: Progressing Goal: Knowledge of the prescribed therapeutic regimen will improve Outcome: Progressing   Problem: Health Behavior/Discharge Planning: Goal: Compliance with prescribed medication regimen will improve Outcome: Progressing

## 2019-03-18 NOTE — BHH Suicide Risk Assessment (Signed)
[facility] Discharge Suicide Risk Assessment   Principal Problem: Schizoaffective disorder, bipolar type [facility]) Discharge Diagnoses: Principal Problem:   Schizoaffective disorder, bipolar type ([place])   Total Time spent with patient: 30 minutes  Musculoskeletal: Strength & Muscle Tone: within normal limits Gait & Station: normal Patient leans: N/A  Psychiatric Specialty Exam: Review of Systems  Constitutional: Negative.   HENT: Negative.   Eyes: Negative.   Respiratory: Negative.   Cardiovascular: Negative.   Gastrointestinal: Negative.   Musculoskeletal: Negative.   Skin: Negative.   Neurological: Negative.   Psychiatric/Behavioral: Negative.     Blood pressure 130/71, pulse 72, temperature 98.5 F (36.9 C), temperature source Oral, resp. rate 18, height 5' 3.5" (1.613 m), weight 91.2 kg, SpO2 97 %.Body mass index is 35.05 kg/m.  General Appearance: Casual  Eye Contact::  Good  Speech:  Clear and [id number]  Volume:  Normal  Mood:  Euthymic  Affect:  Congruent  Thought Process:  Goal Directed  Orientation:  Full (Time, Place, and Person)  Thought Content:  Logical  Suicidal Thoughts:  No  Homicidal Thoughts:  No  Memory:  Immediate;   Fair Recent;   Fair Remote;   Fair  Judgement:  Fair  Insight:  Fair  Psychomotor Activity:  Normal  Concentration:  Fair  Recall:  [employer] of Knowledge:Fair  Language: Fair  Akathisia:  No  Handed:  Right  AIMS (if indicated):     Assets:  Desire for Improvement  Sleep:  Number of Hours: 6.15  Cognition: WNL  ADL's:  Intact   Mental Status Per Nursing Assessment::   On Admission:  NA  Demographic Factors:  Unemployed  Loss Factors: NA  Historical Factors: Impulsivity  Risk Reduction Factors:   Sense of responsibility to family, Positive social support and Positive therapeutic relationship  Continued Clinical Symptoms:  Bipolar Disorder:   Mixed State  Cognitive Features That Contribute To Risk:  None     Suicide Risk:  Minimal: No identifiable suicidal ideation.  Patients presenting with no risk factors but with morbid ruminations; may be classified as minimal risk based on the severity of the depressive symptoms  Follow-up Information    Services, [facility]. Go on 03/23/2019.   Why: You have a scheduled appointment on Tuesday, March 23, 2019 at 9am. Thank you. Contact information: [place] [id number] [phone]           Plan Of Care/Follow-up recommendations:  Activity:  Activity as tolerated Diet:  Regular diet Other:  [facility]  [name], MD 03/18/2019, 4:41 PM

## 2019-03-18 NOTE — Progress Notes (Signed)
[facility] MD Progress Note  03/18/2019 12:13 PM Theresa [name]  MRN:  [id number] Subjective: Follow-up for this patient with schizoaffective disorder.  Patient says that she is feeling better today.  She talks about having "withdrawals" and says she is still had a little nausea and diarrhea but admits that physically she is feeling pretty good.  Does not seem to be in any obvious distress.  Neatly groomed and interacting with others appropriately.  Auditory hallucinations are gone.  Mood better.  Denies suicidal thoughts.  Tolerating medicine.  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 Abnormal vaginal Pap smear   . Anxiety   . Bipolar disorder (HCC)   . Depression   . Migraine without aura   . Polycystic ovary syndrome   . Psychosis (HCC)   . Schizophrenia (HCC)   . STD (sexually transmitted disease) 10-14-15   chlamydia    History reviewed. No pertinent surgical history. Family History:  Family History  Problem Relation Age of Onset  . Hypertension Mother   . Heart disease Mother   . Cancer Mother   . Hypertension Other   . Depression Father   . Drug abuse Father   . Bipolar disorder Father   . Heart disease Maternal Grandmother   . Pancreatitis Maternal Grandfather   . Cancer Paternal Grandmother   . Cancer Paternal Grandfather    Family Psychiatric  History: See previous Social History:  Social History   Substance and Sexual Activity  Alcohol Use Yes  . Alcohol/week: 0.0 standard drinks     Social History   Substance and Sexual  Activity  Drug Use Yes  . Frequency: 2.0 times per week  . Types: Marijuana, Methamphetamines, Cocaine   Comment: meth, heroin, crack    Social History   Socioeconomic History  . Marital status: Legally Separated    Spouse name: Not on file  . Number of children: Not on file  . Years of education: Not on file  . Highest education level: Not on file  Occupational History  . Not on file  Social Needs  . Financial resource strain: Not on file  . Food insecurity    Worry: Not on file    Inability: Not on file  . Transportation needs    Medical: Not on file    Non-medical: Not on file  Tobacco Use  . Smoking status: Current Every Day Smoker    Packs/day: 0.50    Types: Cigarettes  . Smokeless tobacco: Never Used  Substance and Sexual Activity  . Alcohol use: Yes    Alcohol/week: 0.0 standard drinks  . Drug use: Yes    Frequency: 2.0 times per week    Types: Marijuana, Methamphetamines, Cocaine    Comment: meth, heroin, crack  . Sexual activity: Yes    Partners: Male    Birth control/protection: None  Lifestyle  . Physical activity    Days per week: Not on file    Minutes per session: Not on file  . Stress: Not on file  Relationships  . Social connections    Talks on phone: Not on file  Gets together: Not on file    Attends religious service: Not on file    Active member of club or organization: Not on file    Attends meetings of clubs or organizations: Not on file    Relationship status: Not on file  Other Topics Concern  . Not on file  Social History Narrative  . Not on file   Additional Social History:                         Sleep: Fair  Appetite:  Fair  Current Medications: Current Facility-Administered Medications  Medication Dose Route Frequency Provider Last Rate Last Dose  . acetaminophen (TYLENOL) tablet 650 mg  650 mg Oral Q6H PRN Gillermo Murdochhompson, Jacqueline, NP   650 mg at 03/18/19 1155  . alum & mag hydroxide-simeth (MAALOX/MYLANTA)  200-200-20 MG/5ML suspension 30 mL  30 mL Oral Q4H PRN Gillermo Murdochhompson, Jacqueline, NP      . benztropine (COGENTIN) tablet 1 mg  1 mg Oral BID Gillermo Murdochhompson, Jacqueline, NP   1 mg at 03/18/19 0748  . magnesium hydroxide (MILK OF MAGNESIA) suspension 30 mL  30 mL Oral Daily PRN Gillermo Murdochhompson, Jacqueline, NP      . nicotine (NICODERM CQ - dosed in mg/24 hours) patch 21 mg  21 mg Transdermal Q0600 Gillermo Murdochhompson, Jacqueline, NP      . paliperidone (INVEGA) 24 hr tablet 6 mg  6 mg Oral Q supper Clapacs, Jackquline DenmarkJohn T, MD   6 mg at 03/17/19 1839  . traZODone (DESYREL) tablet 100 mg  100 mg Oral QHS Gillermo Murdochhompson, Jacqueline, NP   100 mg at 03/17/19 2107    Lab Results: No results found for this or any previous visit (from the past 48 hour(s)).  Blood Alcohol level:  Lab Results  Component Value Date   ETH <10 03/15/2019   ETH <10 06/29/2018    Metabolic Disorder Labs: Lab Results  Component Value Date   HGBA1C 5.2 07/01/2018   MPG 102.54 07/01/2018   MPG 99.67 10/19/2017   Lab Results  Component Value Date   PROLACTIN 73.5 (H) 10/19/2017   PROLACTIN 4.0 10/14/2015   Lab Results  Component Value Date   CHOL 147 07/01/2018   TRIG 54 07/01/2018   HDL 45 07/01/2018   CHOLHDL 3.3 07/01/2018   VLDL 11 07/01/2018   LDLCALC 91 07/01/2018   LDLCALC 86 10/19/2017    Physical Findings: AIMS:  , ,  ,  ,    CIWA:    COWS:     Musculoskeletal: Strength & Muscle Tone: within normal limits Gait & Station: normal Patient leans: N/A  Psychiatric Specialty Exam: Physical Exam  Nursing note and vitals reviewed. Constitutional: She appears well-developed and well-nourished.  HENT:  Head: Normocephalic and atraumatic.  Eyes: Pupils are equal, round, and reactive to light. Conjunctivae are normal.  Neck: Normal range of motion.  Cardiovascular: Regular rhythm and normal heart sounds.  Respiratory: Effort normal. No respiratory distress.  GI: Soft.  Musculoskeletal: Normal range of motion.  Neurological: She is  alert.  Skin: Skin is warm and dry.  Psychiatric: Judgment normal. Her affect is blunt. Her speech is delayed. She is slowed. Thought content is not paranoid. She expresses no homicidal and no suicidal ideation. She exhibits abnormal recent memory.    Review of Systems  Constitutional: Negative.   HENT: Negative.   Eyes: Negative.   Respiratory: Negative.   Cardiovascular: Negative.   Gastrointestinal: Negative.   Musculoskeletal: Negative.  Skin: Negative.   Neurological: Negative.   Psychiatric/Behavioral: Negative.     Blood pressure 130/71, pulse 72, temperature 98.5 F (36.9 C), temperature source Oral, resp. rate 18, height 5' 3.5" (1.613 m), weight 91.2 kg, SpO2 97 %.Body mass index is 35.05 kg/m.  General Appearance: Casual  Eye Contact:  Good  Speech:  Clear and Coherent  Volume:  Normal  Mood:  Euthymic  Affect:  Congruent  Thought Process:  Goal Directed  Orientation:  Full (Time, Place, and Person)  Thought Content:  Logical  Suicidal Thoughts:  No  Homicidal Thoughts:  No  Memory:  Immediate;   Fair Recent;   Fair Remote;   Fair  Judgement:  Fair  Insight:  Fair  Psychomotor Activity:  Normal  Concentration:  Concentration: Fair  Recall:  [employer] of Knowledge:  Fair  Language:  Fair  Akathisia:  No  Handed:  Right  AIMS (if indicated):     Assets:  Desire for Improvement Housing Social Support  ADL's:  Intact  Cognition:  WNL  Sleep:  Number of Hours: 6.15     Treatment Plan Summary: Daily contact with patient to assess and evaluate symptoms and progress in treatment, Medication management and Plan Supportive therapy.  Review of importance of medication management.  Review of coping skills and processes for substance abuse treatment.  Continue current medicine for today.  Likely discharge tomorrow.  [name], MD 03/18/2019, 12:13 PM

## 2019-03-18 NOTE — Tx Team (Addendum)
Interdisciplinary Treatment and Diagnostic Plan Update  03/18/2019 Time of Session: 9:00AM Theresa [name] MRN: [id number]  Principal Diagnosis: Schizoaffective disorder, bipolar type [facility])  Secondary Diagnoses: Principal Problem:   Schizoaffective disorder, bipolar type ([place])   Current Medications:  Current Facility-Administered Medications  Medication Dose Route Frequency Provider Last Rate Last Dose  . acetaminophen (TYLENOL) tablet 650 mg  650 mg Oral Q6H PRN Caroline Sauger, NP   650 mg at 03/17/19 1953  . alum & mag hydroxide-simeth (MAALOX/MYLANTA) 200-200-20 MG/5ML suspension 30 mL  30 mL Oral Q4H PRN Caroline Sauger, NP      . benztropine (COGENTIN) tablet 1 mg  1 mg Oral BID Caroline Sauger, NP   1 mg at 03/18/19 0748  . magnesium hydroxide (MILK OF MAGNESIA) suspension 30 mL  30 mL Oral Daily PRN Caroline Sauger, NP      . nicotine (NICODERM CQ - dosed in mg/24 hours) patch 21 mg  21 mg Transdermal Q0600 Caroline Sauger, NP      . paliperidone (INVEGA) 24 hr tablet 6 mg  6 mg Oral Q supper Clapacs, Madie Reno, MD   6 mg at 03/17/19 1839  . traZODone (DESYREL) tablet 100 mg  100 mg Oral QHS Caroline Sauger, NP   100 mg at 03/17/19 2107   PTA Medications: Medications Prior to Admission  Medication Sig Dispense Refill Last Dose  . benztropine (COGENTIN) 1 MG tablet Take 1 tablet (1 mg total) by mouth 2 (two) times daily. (Patient not taking: Reported on 03/16/2019) 60 tablet 1   . buprenorphine-naloxone (SUBOXONE) 8-2 mg SUBL SL tablet Place 0.5 tablets under the tongue 2 (two) times daily.     . paliperidone (INVEGA SUSTENNA) 234 MG/1.5ML SUSY injection Inject 234 mg into the muscle every 28 (twenty-eight) days. Next injection on 06/29/2018 1.8 mL 1   . traZODone (DESYREL) 100 MG tablet Take 1 tablet (100 mg total) by mouth at bedtime. (Patient not taking: Reported on 03/16/2019) 30 tablet 1     Patient Stressors: Medication change or  noncompliance Substance abuse  Patient Strengths: Curator fund of knowledge  Treatment Modalities: Medication Management, Group therapy, Case management,  1 to 1 session with clinician, Psychoeducation, Recreational therapy.   Physician Treatment Plan for Primary Diagnosis: Schizoaffective disorder, bipolar type ([place]) Long Term Goal(s): Improvement in symptoms so as ready for discharge Improvement in symptoms so as ready for discharge   Short Term Goals: Ability to maintain clinical measurements within normal limits will improve Compliance with prescribed medications will improve Ability to demonstrate self-control will improve Ability to identify triggers associated with substance abuse/mental health issues will improve  Medication Management: Evaluate patient's response, side effects, and tolerance of medication regimen.  Therapeutic Interventions: 1 to 1 sessions, Unit Group sessions and Medication administration.  Evaluation of Outcomes: Progressing  Physician Treatment Plan for Secondary Diagnosis: Principal Problem:   Schizoaffective disorder, bipolar type ([place])  Long Term Goal(s): Improvement in symptoms so as ready for discharge Improvement in symptoms so as ready for discharge   Short Term Goals: Ability to maintain clinical measurements within normal limits will improve Compliance with prescribed medications will improve Ability to demonstrate self-control will improve Ability to identify triggers associated with substance abuse/mental health issues will improve     Medication Management: Evaluate patient's response, side effects, and tolerance of medication regimen.  Therapeutic Interventions: 1 to 1 sessions, Unit Group sessions and Medication administration.  Evaluation of Outcomes: Progressing   RN Treatment Plan for Primary Diagnosis:  Schizoaffective disorder, bipolar type (HCC) Long Term Goal(s): Knowledge of disease and therapeutic  regimen to maintain health will improve  Short Term Goals: Ability to verbalize frustration and anger appropriately will improve, Ability to demonstrate self-control, Ability to participate in decision making will improve, Ability to verbalize feelings will improve, Ability to disclose and discuss suicidal ideas and Ability to identify and develop effective coping behaviors will improve  Medication Management: RN will administer medications as ordered by provider, will assess and evaluate patient's response and provide education to patient for prescribed medication. RN will report any adverse and/or side effects to prescribing provider.  Therapeutic Interventions: 1 on 1 counseling sessions, Psychoeducation, Medication administration, Evaluate responses to treatment, Monitor vital signs and CBGs as ordered, Perform/monitor CIWA, COWS, AIMS and Fall Risk screenings as ordered, Perform wound care treatments as ordered.  Evaluation of Outcomes: Progressing   LCSW Treatment Plan for Primary Diagnosis: Schizoaffective disorder, bipolar type (HCC) Long Term Goal(s): Safe transition to appropriate next level of care at discharge, Engage patient in therapeutic group addressing interpersonal concerns.  Short Term Goals: Engage patient in aftercare planning with referrals and resources, Increase social support, Increase ability to appropriately verbalize feelings, Increase emotional regulation and Facilitate acceptance of mental health diagnosis and concerns  Therapeutic Interventions: Assess for all discharge needs, 1 to 1 time with Social worker, Explore available resources and support systems, Assess for adequacy in community support network, Educate family and significant other(s) on suicide prevention, Complete Psychosocial Assessment, Interpersonal group therapy.  Evaluation of Outcomes: Progressing   Progress in Treatment: Attending groups: Yes. Participating in groups: Yes. Taking medication as  prescribed: Yes. Toleration medication: Yes. Family/Significant other contact made: No, will contact:  once permission is given. Patient understands diagnosis: Yes. Discussing patient identified problems/goals with staff: Yes. Medical problems stabilized or resolved: Yes. Denies suicidal/homicidal ideation: Yes. Issues/concerns per patient self-inventory: No. Other: none  New problem(s) identified: No, Describe:  none  New Short Term/Long Term Goal(s): medication management for mood stabilization; elimination of SI thoughts; development of comprehensive mental wellness/sobriety plan.  Patient Goals:  "make sure I'm on the right medication"  Discharge Plan or Barriers: Patient reports plans to return home.  Patient reports plans to follow up with current provider, [facility] in [place].  Reason for Continuation of Hospitalization: Anxiety Depression Medication stabilization Withdrawal symptoms  Estimated Length of Stay: 1-7 days  Recreational Therapy: Patient Stressors: N/A  Patient Goal: Patient will engage in groups without prompting or encouragement from LRT x3 group sessions within 5 recreation therapy group sessions  Attendees: Patient: Theresa [name] 03/18/2019 10:21 AM  Physician:  03/18/2019 10:21 AM  Nursing: Hulan Amato, RN 03/18/2019 10:21 AM  RN Care Manager: 03/18/2019 10:21 AM  Social Worker: Penni Homans, LCSW 03/18/2019 10:21 AM  Recreational Therapist: Hilbert Bible, LRT 03/18/2019 10:21 AM  Other: Iris Pert, LCSW 03/18/2019 10:21 AM  Other: Lowella Dandy, LCSW 03/18/2019 10:21 AM  Other:Travis Money, NP 03/18/2019 10:21 AM    Scribe for Treatment Team: Harden Mo, LCSW 03/18/2019 10:21 AM

## 2019-03-18 NOTE — Progress Notes (Signed)
D:Altered Thought  A: Patient stated slept good last night .Stated appetite good and energy level  normal. Stated concentration is good . Stated on Depression scale 0 , hopeless 0 and anxiety 0 .( low 0-10 high) Denies suicidal  homicidal ideations  .Denies  auditory hallucinations  No pain concerns . Appropriate ADL'S and personal chores  Interacting with peers and staff.  Encourage patient participation with unit programming . Instruction  Given on  Medication , verbalize understanding. Affect  Cheerful onapproach . Conversation with patient improved  With content.   R: Voice no other concerns. Staff continue to monitor

## 2019-03-19 MED ORDER — PALIPERIDONE ER 6 MG PO TB24: 6.0000 mg | ORAL_TABLET | Freq: Every day | ORAL | 1 refills | Status: DC

## 2019-03-19 MED ORDER — TRAZODONE HCL 100 MG PO TABS: 100.0000 mg | ORAL_TABLET | Freq: Every day | ORAL | 1 refills | Status: DC

## 2019-03-19 MED ORDER — PALIPERIDONE PALMITATE ER 234 MG/1.5ML IM SUSY: 234.0000 mg | PREFILLED_SYRINGE | INTRAMUSCULAR | 1 refills | Status: DC

## 2019-03-19 MED ORDER — BENZTROPINE MESYLATE 1 MG PO TABS: 1.0000 mg | ORAL_TABLET | Freq: Two times a day (BID) | ORAL | 1 refills | Status: DC

## 2019-03-19 NOTE — Plan of Care (Signed)
  Problem: Education: Goal: Will be free of psychotic symptoms Outcome: Progressing  Patient appears free of psychosis no bizarre behavior,

## 2019-03-19 NOTE — Progress Notes (Signed)
Recreation Therapy Notes  INPATIENT RECREATION TR PLAN  Patient Details Name: Theresa [name] MRN: [id number] DOB: 12-22-1995 Today's Date: 03/19/2019  Rec Therapy Plan Is patient appropriate for Therapeutic Recreation?: Yes Treatment times per week: at least 3 Estimated Length of Stay: 5-7 days TR Treatment/Interventions: Group participation (Comment)  Discharge Criteria Pt will be discharged from therapy if:: Discharged Treatment plan/goals/alternatives discussed and agreed upon by:: Patient/family  Discharge Summary Short term goals set: Patient will engage in groups without prompting or encouragement from LRT x3 group sessions within 5 recreation therapy group sessions Short term goals met: Adequate for discharge Progress toward goals comments: Groups attended Which groups?: Other (Comment)(Emotions) Reason goals not met: N/A Therapeutic equipment acquired: N/A Reason patient discharged from therapy: Discharge from hospital Pt/family agrees with progress & goals achieved: Yes Date patient discharged from therapy: 03/19/19   Theresa [name] 03/19/2019, 12:10 PM

## 2019-03-19 NOTE — Discharge Summary (Signed)
Physician Discharge Summary Note  Patient:  Theresa [name] is an 23 y.o., female MRN:  [id number] DOB:  05/19/1996 Patient phone:  [phone] (home)  Patient address:   [address] [place] [address],  Total Time spent with patient: 30 minutes  Date of Admission:  03/16/2019 Date of Discharge: 03/19/19  Reason for Admission:  Patient is a 23 year old woman who presented in [facility] due to a return of hallucinations.  Patient reported auditory hallucinations as well as poor sleep and concintration.  Patient also reported that she has been using drugs more heavily prior to admission.  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  Principal Problem: Schizoaffective disorder, bipolar type [facility]) Discharge Diagnoses: Principal Problem:   Schizoaffective disorder, bipolar type [facility])   Past Psychiatric History: Per chart: Patient has a history of schizoaffective disorder with several prior hospitalizations.  This seems to be an escalation in her substance abuse.  Previously had been using marijuana but I do not think anything stronger.  [facility] and was stable for quite a while on her long-acting Invega injection.  Not sure if current decompensation is a change in the illness or just caused by the substance abuse.  No past suicide attempts.   Past Medical History:  Past Medical History:  Diagnosis Date  . Abnormal vaginal Pap smear   . Anxiety   . Bipolar disorder (HCC)   . Depression   . Migraine without aura   . Polycystic ovary syndrome   . Psychosis (HCC)   . Schizophrenia (HCC)   . STD (sexually transmitted disease) 10-14-15   chlamydia    History reviewed. No pertinent surgical history. Family History:  Family History  Problem Relation Age of Onset  . Hypertension Mother   . Heart disease Mother   . Cancer Mother   . Hypertension Other   . Depression Father   . Drug abuse Father   .  Bipolar disorder Father   . Heart disease Maternal Grandmother   . Pancreatitis Maternal Grandfather   . Cancer Paternal Grandmother   . Cancer Paternal Grandfather    Family Psychiatric  History: See above Social History:  Social History   Substance and Sexual Activity  Alcohol Use Yes  . Alcohol/week: 0.0 standard drinks     Social History   Substance and Sexual Activity  Drug Use Yes  . Frequency: 2.0 times per week  . Types: Marijuana, Methamphetamines, Cocaine   Comment: meth, heroin, crack    Social History   Socioeconomic History  . Marital status: Legally Separated    Spouse name: Not on file  . Number of children: Not on file  . Years of education: Not on file  . Highest education level: Not on file  Occupational History  . Not on file  Social Needs  . Financial resource strain: Not on file  . Food insecurity    Worry: Not on file    Inability: Not on file  . Transportation needs    Medical: Not on file    Non-medical: Not on file  Tobacco Use  . Smoking status: Current Every Day Smoker    Packs/day: 0.50    Types: Cigarettes  . Smokeless tobacco: Never Used  Substance and Sexual Activity  . Alcohol use: Yes    Alcohol/week: 0.0 standard drinks  . Drug use: Yes    Frequency: 2.0 times per week  Types: Marijuana, Methamphetamines, Cocaine    Comment: meth, heroin, crack  . Sexual activity: Yes    Partners: Male    Birth control/protection: None  Lifestyle  . Physical activity    Days per week: Not on file    Minutes per session: Not on file  . Stress: Not on file  Relationships  . Social Herbalist on phone: Not on file    Gets together: Not on file    Attends religious service: Not on file    Active member of club or organization: Not on file    Attends meetings of clubs or organizations: Not on file    Relationship status: Not on file  Other Topics Concern  . Not on file  Social History Narrative  . Not on file     Hospital Course:  Patient remained on the [facility] unit for 3 days. The patient stabilized on medication and therapy. Patient during her cource of treatment was placed on Cogentin 1 mg twice a day, Invega sustenna 234 next injection due 06/29/2018, invega 6 mg po daily, trazadone 100 mg hs.  After this medication administration patient has shown improvement with improved mood, affect, sleep, appetite, and interaction.  Patient was discharged on 03/19/2019.  Patient has attended group and participated. Patient has been seen in the day room interacting with peers and staff appropriately. Patient denies any SI/HI/AVH and contracts for safety. Patient agrees to follow up at [facility] recovery. Patient is provided with prescriptions for their medications upon discharge.  Physical Findings: AIMS:  , ,  ,  ,    CIWA:    COWS:     Musculoskeletal: Strength & Muscle Tone: within normal limits Gait & Station: normal Patient leans: N/A  Psychiatric Specialty Exam: Physical Exam  Nursing note and vitals reviewed. Constitutional: She is oriented to person, place, and time. She appears well-developed and well-nourished.  Cardiovascular: Normal rate.  Respiratory: Effort normal.  Musculoskeletal: Normal range of motion.  Neurological: She is alert and oriented to person, place, and time.  Skin: Skin is warm.  Psychiatric: She has a normal mood and affect. Her speech is normal and behavior is normal. She is not agitated and not aggressive. Cognition and memory are normal. She does not express inappropriate judgment. She expresses no homicidal and no suicidal ideation.    Review of Systems  Constitutional: Negative.   HENT: Negative.   Eyes: Negative.   Respiratory: Negative.   Cardiovascular: Negative.   Gastrointestinal: Negative.   Genitourinary: Negative.   Musculoskeletal: Negative.   Skin: Negative.   Neurological: Negative.   Endo/Heme/Allergies: Negative.   Psychiatric/Behavioral: Negative for  hallucinations and suicidal ideas. The patient does not have insomnia.     Blood pressure 130/75, pulse 66, temperature 97.9 F (36.6 C), temperature source Oral, resp. rate 17, height 5' 3.5" (1.613 m), weight 91.2 kg, SpO2 99 %.Body mass index is 35.05 kg/m.  General Appearance: Casual  Eye Contact:  Good  Speech:  Clear and Coherent  Volume:  Normal  Mood:  Euthymic  Affect:  Congruent  Thought Process:  Goal Directed  Orientation:  Full (Time, Place, and Person)  Thought Content:  Logical  Suicidal Thoughts:  No  Homicidal Thoughts:  No  Memory:  Immediate;   Good  Judgement:  Fair  Insight:  Fair  Psychomotor Activity:  Normal  Concentration:  Concentration: Good  Recall:  Good  Fund of Knowledge:  Good  Language:  Good  Akathisia:  No  Handed:  Right  AIMS (if indicated):     Assets:  Communication Skills Desire for Improvement Physical Health Social Support  ADL's:  Intact  Cognition:  WNL  Sleep:  Number of Hours: 5.25     Have you used any form of tobacco in the last 30 days? (Cigarettes, Smokeless Tobacco, Cigars, and/or Pipes): Yes  Has this patient used any form of tobacco in the last 30 days? (Cigarettes, Smokeless Tobacco, Cigars, and/or Pipes) Yes, No  Blood Alcohol level:  Lab Results  Component Value Date   ETH <10 03/15/2019   ETH <10 06/29/2018    Metabolic Disorder Labs:  Lab Results  Component Value Date   HGBA1C 5.2 07/01/2018   MPG 102.54 07/01/2018   MPG 99.67 10/19/2017   Lab Results  Component Value Date   PROLACTIN 73.5 (H) 10/19/2017   PROLACTIN 4.0 10/14/2015   Lab Results  Component Value Date   CHOL 147 07/01/2018   TRIG 54 07/01/2018   HDL 45 07/01/2018   CHOLHDL 3.3 07/01/2018   VLDL 11 07/01/2018   LDLCALC 91 07/01/2018   LDLCALC 86 10/19/2017    See Psychiatric Specialty Exam and Suicide Risk Assessment completed by Attending Physician prior to discharge.  Discharge destination:  Home  Is patient on multiple  antipsychotic therapies at discharge:  No   Has Patient had three or more failed trials of antipsychotic monotherapy by history:  No  Recommended Plan for Multiple Antipsychotic Therapies: NA  Discharge Instructions    Diet - low sodium heart healthy   Complete by: As directed    Increase activity slowly   Complete by: As directed      Allergies as of 03/19/2019   No Known Allergies     Medication List    STOP taking these medications   buprenorphine-naloxone 8-2 mg Subl SL tablet Commonly known as: SUBOXONE     TAKE these medications     Indication  benztropine 1 MG tablet Commonly known as: COGENTIN Take 1 tablet (1 mg total) by mouth 2 (two) times daily.  Indication: Extrapyramidal Reaction caused by Medications   paliperidone 234 MG/1.5ML Susy injection Commonly known as: INVEGA SUSTENNA Inject 234 mg into the muscle every 28 (twenty-eight) days. Next injection on 06/29/2018 Start taking on: April 08, 2019  Indication: Schizophrenia   paliperidone 6 MG 24 hr tablet Commonly known as: INVEGA Take 1 tablet (6 mg total) by mouth daily with supper.  Indication: Schizoaffective Disorder   traZODone 100 MG tablet Commonly known as: DESYREL Take 1 tablet (100 mg total) by mouth at bedtime.  Indication: Trouble Sleeping      Follow-up Information    Services, [facility]. Go on 03/23/2019.   Why: You have a scheduled appointment on Tuesday, March 23, 2019 at 9am. Thank you. Contact information: [address] [place] [address] [phone]           Follow-up recommendations:  Patient is instructed prior to discharge to: Take all medications as prescribed by his/her mental healthcare provider. Report any adverse effects and or reactions from the medicines to his/her outpatient provider promptly. Patient has been instructed & cautioned: To not engage in alcohol and or illegal drug use while on prescription medicines. In the event of worsening  symptoms, patient is instructed to call the crisis hotline, 911 and or go to the nearest ED for appropriate evaluation and treatment of symptoms. To follow-up with his/her primary care provider for your other medical issues,  concerns and or health care needs.    Comments:  Continue activity as tolerated. Continue diet as recommended by your PCP. Ensure to keep all appointments with outpatient providers.  Assessment: Patient is calm and co operative upon discharge.  She denies any suicidal ideation, homicidal ideation, auditory or visual hallucinations at this time.  She verbalizes understanding of discharge instruction and is in agreement with discharge.   Signed: Gerlene Burdockravis B Angelgabriel Willmore, FNP 03/19/2019, 9:36 AM

## 2019-03-19 NOTE — [facility] Counselor (Signed)
[name] attempted to contact the patient's mother [name], mother [phone] to confirm if she is the patient's legal guardian or not. Patient reports that she does not have a legal guardian, however, chart indicates that she does.  No guardianship paperwork was found in patient's chart.  Assunta Curtis, MSW, LCSW 03/19/2019 10:35 AM

## 2019-03-19 NOTE — Progress Notes (Signed)
Pt was educated on dc plan and verbalizes understanding. Pt received transition packet, prescriptions and belongings. Pt denies SI, HI and AVH. Pt was dc to mother. Collier Bullock RN

## 2019-03-19 NOTE — Plan of Care (Signed)
  Problem: Group Participation Goal: STG - Patient will engage in groups without prompting or encouragement from LRT x3 group sessions within 5 recreation therapy group sessions Description: STG - Patient will engage in groups without prompting or encouragement from LRT x3 group sessions within 5 recreation therapy group sessions 03/19/2019 1210 by Ernest Haber, LRT Outcome: Adequate for Discharge 03/19/2019 1210 by Ernest Haber, LRT Outcome: Adequate for Discharge

## 2019-03-19 NOTE — Progress Notes (Signed)
  [facility] Adult Case Management Discharge Plan :  Will you be returning to the same living situation after discharge:  Yes,  pt reports that she is returning home. At discharge, do you have transportation home?: Yes,  pt reports pt will be returning home Do you have the ability to pay for your medications: Yes,  Medicare  Release of information consent forms completed and in the chart;  Patient's signature needed at discharge.  Patient to Follow up at: Follow-up Information    [facility]. Go on 03/23/2019.   Why: You have a scheduled appointment on Tuesday, March 23, 2019 at 9am. Thank you. Contact information: [place] [address] [phone]           Next level of care provider has access to [place] and Suicide Prevention discussed: Yes,  SPE completed with mother.  Have you used any form of tobacco in the last 30 days? (Cigarettes, Smokeless Tobacco, Cigars, and/or Pipes): Yes  Has patient been referred to the Quitline?: Patient refused referral  Patient has been referred for addiction treatment: [place], LCSW 03/19/2019, 10:09 AM

## 2019-03-19 NOTE — Progress Notes (Signed)
Patient alert and oriented x 4, affect is flat  but she brightens upon approach, makes appropriate eye contact, che denies SI/HI/AVH no distress noted, interacting appropriately with peers and staff, denies SI/HI/AVH,N 15 minutes safety checks maintained will continue  to monitor

## 2019-03-19 NOTE — Plan of Care (Signed)
Pt denies depression, anxiety, SI, HI and AVH. Pt was educated on care plan and verbalizes understanding. Collier Bullock RN Problem: Education: Goal: Knowledge of [facility] Education information/materials will improve Outcome: Adequate for Discharge Goal: Emotional status will improve Outcome: Adequate for Discharge Goal: Mental status will improve Outcome: Adequate for Discharge Goal: Verbalization of understanding the information provided will improve Outcome: Adequate for Discharge   Problem: Health Behavior/Discharge Planning: Goal: Compliance with treatment plan for underlying cause of condition will improve Outcome: Adequate for Discharge   Problem: Safety: Goal: Periods of time without injury will increase Outcome: Adequate for Discharge   Problem: Education: Goal: Will be free of psychotic symptoms Outcome: Adequate for Discharge Goal: Knowledge of the prescribed therapeutic regimen will improve Outcome: Adequate for Discharge   Problem: Health Behavior/Discharge Planning: Goal: Compliance with prescribed medication regimen will improve Outcome: Adequate for Discharge   Problem: Self-Concept: Goal: Ability to identify factors that promote anxiety will improve Outcome: Adequate for Discharge Goal: Level of anxiety will decrease Outcome: Adequate for Discharge Goal: Ability to modify response to factors that promote anxiety will improve Outcome: Adequate for Discharge

## 2019-03-19 NOTE — Progress Notes (Signed)
Recreation Therapy Notes    Date: 03/19/2019  Time: 9:30 am   Location: Craft room   Behavioral response: N/A   Intervention Topic: Leisure  Discussion/Intervention: Patient did not attend group.   Clinical Observations/Feedback:  Patient did not attend group.   Verdine Grenfell LRT/CTRS          Lynley Killilea 03/19/2019 12:01 PM

## 2019-06-30 ENCOUNTER — Emergency Department
Admission: EM | Admit: 2019-06-30 | Discharge: 2019-07-01 | Disposition: A | Discharge status: Home / Self Care | Payer: Medicare Other | Attending: Emergency Medicine | Admitting: Emergency Medicine

## 2019-06-30 ENCOUNTER — Other Ambulatory Visit: Payer: Self-pay

## 2019-06-30 ENCOUNTER — Encounter: Payer: Self-pay | Admitting: Emergency Medicine

## 2019-06-30 DIAGNOSIS — F17210 Nicotine dependence, cigarettes, uncomplicated: Secondary | ICD-10-CM | POA: Diagnosis not present

## 2019-06-30 DIAGNOSIS — F3164 Bipolar disorder, current episode mixed, severe, with psychotic features: Secondary | ICD-10-CM | POA: Insufficient documentation

## 2019-06-30 DIAGNOSIS — Z79899 Other long term (current) drug therapy: Secondary | ICD-10-CM | POA: Diagnosis not present

## 2019-06-30 DIAGNOSIS — F209 Schizophrenia, unspecified: Secondary | ICD-10-CM | POA: Insufficient documentation

## 2019-06-30 DIAGNOSIS — F29 Unspecified psychosis not due to a substance or known physiological condition: Secondary | ICD-10-CM | POA: Diagnosis present

## 2019-06-30 DIAGNOSIS — F17200 Nicotine dependence, unspecified, uncomplicated: Secondary | ICD-10-CM | POA: Diagnosis present

## 2019-06-30 DIAGNOSIS — E282 Polycystic ovarian syndrome: Secondary | ICD-10-CM | POA: Diagnosis present

## 2019-06-30 DIAGNOSIS — F1110 Opioid abuse, uncomplicated: Secondary | ICD-10-CM | POA: Insufficient documentation

## 2019-06-30 DIAGNOSIS — N390 Urinary tract infection, site not specified: Secondary | ICD-10-CM | POA: Diagnosis present

## 2019-06-30 DIAGNOSIS — Z20822 Contact with and (suspected) exposure to covid-19: Secondary | ICD-10-CM | POA: Diagnosis not present

## 2019-06-30 DIAGNOSIS — F1220 Cannabis dependence, uncomplicated: Secondary | ICD-10-CM | POA: Diagnosis present

## 2019-06-30 DIAGNOSIS — F250 Schizoaffective disorder, bipolar type: Secondary | ICD-10-CM | POA: Diagnosis present

## 2019-06-30 DIAGNOSIS — F203 Undifferentiated schizophrenia: Secondary | ICD-10-CM | POA: Diagnosis present

## 2019-06-30 DIAGNOSIS — Z046 Encounter for general psychiatric examination, requested by authority: Secondary | ICD-10-CM | POA: Diagnosis present

## 2019-06-30 LAB — CBC
HCT: 44.4 % (ref 36.0–46.0)
Hemoglobin: 15.1 g/dL — ABNORMAL HIGH (ref 12.0–15.0)
MCH: 30.6 pg (ref 26.0–34.0)
MCHC: 34 g/dL (ref 30.0–36.0)
MCV: 89.9 fL (ref 80.0–100.0)
Platelets: 343 10*3/uL (ref 150–400)
RBC: 4.94 MIL/uL (ref 3.87–5.11)
RDW: 12.4 % (ref 11.5–15.5)
WBC: 14 10*3/uL — ABNORMAL HIGH (ref 4.0–10.5)
nRBC: 0 % (ref 0.0–0.2)

## 2019-06-30 LAB — COMPREHENSIVE METABOLIC PANEL
ALT: 18 U/L (ref 0–44)
AST: 16 U/L (ref 15–41)
Albumin: 4.3 g/dL (ref 3.5–5.0)
Alkaline Phosphatase: 109 U/L (ref 38–126)
Anion gap: 6 (ref 5–15)
BUN: 9 mg/dL (ref 6–20)
CO2: 29 mmol/L (ref 22–32)
Calcium: 8.9 mg/dL (ref 8.9–10.3)
Chloride: 105 mmol/L (ref 98–111)
Creatinine, Ser: 0.86 mg/dL (ref 0.44–1.00)
GFR calc Af Amer: >60 mL/min (ref >60)
GFR calc non Af Amer: >60 mL/min (ref >60)
Glucose, Bld: 151 mg/dL — ABNORMAL HIGH (ref 70–99)
Potassium: 3.4 mmol/L — ABNORMAL LOW (ref 3.5–5.1)
Sodium: 140 mmol/L (ref 135–145)
Total Bilirubin: 0.6 mg/dL (ref 0.3–1.2)
Total Protein: 7.4 g/dL (ref 6.5–8.1)

## 2019-06-30 LAB — ACETAMINOPHEN LEVEL: Acetaminophen (Tylenol), Serum: <10 ug/mL — ABNORMAL LOW (ref 10–30)

## 2019-06-30 LAB — ETHANOL: Alcohol, Ethyl (B): <10 mg/dL (ref <10)

## 2019-06-30 LAB — SALICYLATE LEVEL: Salicylate Lvl: <7 mg/dL — ABNORMAL LOW (ref 7.0–30.0)

## 2019-06-30 NOTE — ED Notes (Signed)
Patient's belongings: 1 pair black boots, 1 pair socks, 1 pair jeans, 1 black sweatshirt, 1 multicolor shirt, 1 black tank top, 1 black bra, 1 pair underwear, 1 hair tie.

## 2019-06-30 NOTE — ED Triage Notes (Signed)
Patient to ER for c/o needing psych eval. Patient states she has been intermittently hearing two voices (not commanding her to do anything, patient reports the voices felt "spiritual"). Patient denies SI.

## 2019-06-30 NOTE — ED Provider Notes (Addendum)
[facility] Emergency Department Provider Note  Time seen: 10:33 PM  I have reviewed the triage vital signs and the nursing notes.   HISTORY  Chief Complaint Psychiatric Evaluation   HPI Theresa [name] is a 24 y.o. female with a past medical history of anxiety, bipolar, schizophrenia, presents to the emergency department hearing voices.  According to the patient for the past several months she has been hearing voices which are getting worse.  Patient also states she has been using heroin, last use was earlier today and is hoping to detox off of heroin.  Patient denies any SI or HI.  Has no medical complaints at this time.  Largely negative review of systems including no fever cough congestion or shortness of breath.  Past Medical History:  Diagnosis Date  . Abnormal vaginal Pap smear   . Anxiety   . Bipolar disorder (HCC)   . Depression   . Migraine without aura   . Polycystic ovary syndrome   . Psychosis (HCC)   . Schizophrenia (HCC)   . STD (sexually transmitted disease) 10-14-15   chlamydia     Patient Active Problem List   Diagnosis Date Noted  . Psychotic disorder (HCC) 03/16/2019  . 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 Schizoaffective disorder, bipolar type (HCC) 10/17/2017  . UTI (urinary tract infection) 09/28/2017  . 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 Bipolar affective disorder (HCC) 09/20/2017  . Bipolar affective disorder, mixed, severe, with psychotic behavior (HCC) 10/04/2016  . PCOS (polycystic ovarian syndrome) 01/18/2016  . Cannabis use disorder, moderate, dependence (HCC) 12/06/2015  . Tobacco use disorder 12/05/2015    History reviewed. No pertinent surgical history.  Prior to Admission medications   Medication Sig Start Date End Date Taking? Authorizing Provider  benztropine (COGENTIN) 1 MG tablet Take 1 tablet (1 mg total) by mouth 2 (two) times daily. 03/19/19   Clapacs, Jackquline Denmark, MD  paliperidone (INVEGA SUSTENNA)  234 MG/1.5ML SUSY injection Inject 234 mg into the muscle every 28 (twenty-eight) days. Next injection on 06/29/2018 04/08/19   Clapacs, Jackquline Denmark, MD  paliperidone (INVEGA) 6 MG 24 hr tablet Take 1 tablet (6 mg total) by mouth daily with supper. 03/19/19   Clapacs, Jackquline Denmark, MD  traZODone (DESYREL) 100 MG tablet Take 1 tablet (100 mg total) by mouth at bedtime. 03/19/19   Clapacs, Jackquline Denmark, MD    No Known Allergies  Family History  Problem Relation Age of Onset  . Hypertension Mother   . Heart disease Mother   . Cancer Mother   . Hypertension Other   . Depression Father   . Drug abuse Father   . Bipolar disorder Father   . Heart disease Maternal Grandmother   . Pancreatitis Maternal Grandfather   . Cancer Paternal Grandmother   . Cancer Paternal Grandfather     Social History Social History   Tobacco Use  . Smoking status: Current Every Day Smoker    Packs/day: 0.50    Types: Cigarettes  . Smokeless tobacco: Never Used  Substance Use Topics  . Alcohol use: Yes    Alcohol/week: 0.0 standard drinks  . Drug use: Yes    Frequency: 2.0 times per week    Types: Marijuana, Methamphetamines, Cocaine    Comment: meth, heroin, crack    Review of Systems  Constitutional: Negative for fever. Eyes: Negative for visual complaints ENT: Negative for recent illness/congestion Cardiovascular: Negative for chest pain. Respiratory: Negative for shortness of breath. Gastrointestinal: Negative for abdominal pain,  vomiting and diarrhea. Genitourinary: Negative for urinary compaints Musculoskeletal: Negative for musculoskeletal complaints Skin: Negative for skin complaints  Neurological: Negative for headache All other ROS negative  ____________________________________________   PHYSICAL EXAM:  VITAL SIGNS: ED Triage Vitals  Enc Vitals Group     BP 06/30/19 2004 130/70     Pulse Rate 06/30/19 2004 88     Resp 06/30/19 2004 18     Temp 06/30/19 2004 97.8 F (36.6 C)     Temp Source  06/30/19 2004 Oral     SpO2 06/30/19 2004 97 %     Weight 06/30/19 2005 200 lb (90.7 kg)     Height 06/30/19 2005 5' 3.5" (1.613 m)     Head Circumference --      Peak Flow --      Pain Score 06/30/19 2005 0     Pain Loc --      Pain Edu? --      Excl. in [place]? --     Constitutional: Alert and oriented. Well appearing and in no distress. Eyes: Normal exam ENT      Head: Normocephalic and atraumatic.      Mouth/Throat: Mucous membranes are moist. Cardiovascular: Normal rate, regular rhythm.  Respiratory: Normal respiratory effort without tachypnea nor retractions. Breath sounds are clear  Gastrointestinal: Soft and nontender. No distention.  Musculoskeletal: Nontender with normal range of motion in all extremities.  Neurologic:  Normal speech and language. No gross focal neurologic deficits Skin:  Skin is warm, dry and intact.  Psychiatric: Mood and affect are normal.  Denies SI or HI.  ____________________________________________   INITIAL IMPRESSION / ASSESSMENT AND PLAN / ED COURSE  Pertinent labs & imaging results that were available during my care of the patient were reviewed by me and considered in my medical decision making (see chart for details).   Patient presents emergency department for heroin detox and has been hearing voices for the past several months.  Patient states she received an Invega injection in November but has not been back for any further injections.  States she felt that the injection did help at the time.  We will have the patient see psychiatry, TTS and continue to closely monitor the patient.  Patient's initial lab work is largely nonrevealing, urine is pending.  Patient's vitals and physical exam are reassuring.  TTS and psychiatry have seen the patient.  Patient wishes to be discharged from the emergency department.  We will provide RTS RJ follow-up for the patient.  Theresa [name] was evaluated in Emergency Department on 06/30/2019 for the symptoms  described in the history of present illness. She was evaluated in the context of the global COVID-19 pandemic, which necessitated consideration that the patient might be at risk for infection with the SARS-CoV-2 virus that causes COVID-19. Institutional protocols and algorithms that pertain to the evaluation of patients at risk for COVID-19 are in a state of rapid change based on information released by regulatory bodies including the CDC and federal and state organizations. These policies and algorithms were followed during the patient's care in the ED.  ____________________________________________   FINAL CLINICAL IMPRESSION(S) / ED DIAGNOSES  Psychosis Opiate withdrawal   Harvest Dark, MD 06/30/19 1308    Harvest Dark, MD 06/30/19 [phone]

## 2019-06-30 NOTE — ED Notes (Signed)
Pt's mother to lobby, expresses concern for acute psychosis, requests that psychiatry contact her prior to any decisions made.  Versie Starks, [phone].

## 2019-07-01 DIAGNOSIS — F209 Schizophrenia, unspecified: Secondary | ICD-10-CM | POA: Diagnosis not present

## 2019-07-01 LAB — RESPIRATORY PANEL BY RT PCR (FLU A&B, COVID)
Influenza A by PCR: NEGATIVE
Influenza B by PCR: NEGATIVE
SARS Coronavirus 2 by RT PCR: NEGATIVE

## 2019-07-01 NOTE — BH Assessment (Signed)
Assessment Note  Theresa [name] is an 24 y.o. female presenting to [facility] ED voluntarily brought in by her mother. Per triage note Patient to ER for c/o needing psych eval. Patient states she has been intermittently hearing two voices (not commanding her to do anything, patient reports the voices felt "spiritual"). Patient denies SI. During assessment patient appeared and alert and oriented x4, pleasant and cooperative. Patient reported why she was presenting to ED she reported AH "I"ve been hearing voices since November, they started when I stopped taking my Invega shot and then the voices came back." Patient reported the reason she stopped shot "I didn't like how it made me feel." Patient reported being diagnosed with Bipolar Disorder with manic episode and Schizophrenia with catatonic behavior in 2017. Patient reported that she is currently hearing voices "they don't want me to hurt myself or someone else." Patient reported a history of using heroin and reported her last use being today 06/30/19. Patient denied using heroin daily and reported "sometimes I used sometimes I didn't." Patient denied SI/HI/VH.   Collateral was obtained from patient's mother by Psyc NP, patient gave verbal consent to talk with patient's mother. Patient's mother reported that patient is a danger to herself and needs inpatient treatment.   Per Psyc NP patient will be observed overnight and reassessed in the morning.   Diagnosis: Schizophrenia  Past Medical History:  Past Medical History:  Diagnosis Date  . Abnormal vaginal Pap smear   . Anxiety   . Bipolar disorder (HCC)   . Depression   . Migraine without aura   . Polycystic ovary syndrome   . Psychosis (HCC)   . Schizophrenia (HCC)   . STD (sexually transmitted disease) 10-14-15   chlamydia     History reviewed. No pertinent surgical history.  Family History:  Family History  Problem Relation Age of Onset  . Hypertension Mother   . Heart disease Mother    . Cancer Mother   . Hypertension Other   . Depression Father   . Drug abuse Father   . Bipolar disorder Father   . Heart disease Maternal Grandmother   . Pancreatitis Maternal Grandfather   . Cancer Paternal Grandmother   . Cancer Paternal Grandfather     Social History:  reports that she has been smoking cigarettes. She has been smoking about 0.50 packs per day. She has never used smokeless tobacco. She reports current alcohol use. She reports current drug use. Frequency: 2.00 times per week. Drugs: Marijuana, Methamphetamines, and Cocaine.  Additional Social History:  Alcohol / Drug Use Pain Medications: See MAR Prescriptions: See MAR Over the Counter: See MAR History of alcohol / drug use?: Yes Substance #1 Name of Substance 1: Heroin 1 - Age of First Use: 20 1 - Amount (size/oz): $20 worth 1 - Duration: 3 years 1 - Last Use / Amount: 06/30/19  CIWA: CIWA-Ar BP: 130/70 Pulse Rate: 88 COWS:    Allergies:  Allergies  Allergen Reactions  . Seroquel [Quetiapine]     Home Medications: (Not in a hospital admission)   OB/GYN Status:  No LMP recorded. (Menstrual status: Irregular Periods).  General Assessment Data Location of Assessment: [facility] ED TTS Assessment: In system Is this a Tele or Face-to-Face Assessment?: Face-to-Face Is this an Initial Assessment or a Re-assessment for this encounter?: Initial Assessment Patient Accompanied by:: N/A Language Other than English: No Living Arrangements: Other (Comment)(Currently living with mother) What gender do you identify as?: Female Marital status: Single Pregnancy Status: No  Living Arrangements: Parent Can pt return to current living arrangement?: Yes Admission Status: Voluntary Is patient capable of signing voluntary admission?: Yes Referral Source: Self/Family/Friend Insurance type: Medicare  Medical Screening Exam [facility] Walk-in ONLY) Medical Exam completed: Yes  Crisis Care Plan Living Arrangements:  Parent Legal Guardian: Other:(Self) Name of Psychiatrist: None currently Name of Therapist: None currently  Education Status Is patient currently in school?: No Is the patient employed, unemployed or receiving disability?: Receiving disability income  Risk to self with the past 6 months Suicidal Ideation: No Has patient been a risk to self within the past 6 months prior to admission? : No Suicidal Intent: No Has patient had any suicidal intent within the past 6 months prior to admission? : No Is patient at risk for suicide?: No Suicidal Plan?: No Has patient had any suicidal plan within the past 6 months prior to admission? : No Access to Means: No What has been your use of drugs/alcohol within the last 12 months?: Heroin use Other Self Harm Risks: Substance abuse Intentional Self Injurious Behavior: None Family Suicide History: No Recent stressful life event(s): Other (Comment)(Current stress with boyfriend) Persecutory voices/beliefs?: Yes Depression: Yes Depression Symptoms: Isolating, Loss of interest in usual pleasures Substance abuse history and/or treatment for substance abuse?: Yes Suicide prevention information given to non-admitted patients: Not applicable  Risk to Others within the past 6 months Homicidal Ideation: No Does patient have any lifetime risk of violence toward others beyond the six months prior to admission? : No Thoughts of Harm to Others: No Current Homicidal Intent: No Current Homicidal Plan: No Access to Homicidal Means: No History of harm to others?: No Assessment of Violence: None Noted Does patient have access to weapons?: No Criminal Charges Pending?: No Does patient have a court date: No Is patient on probation?: No  Psychosis Hallucinations: Auditory Delusions: None noted  Mental Status Report Appearance/Hygiene: In scrubs Eye Contact: Good Motor Activity: Freedom of movement Speech: Logical/coherent Level of Consciousness:  Alert Mood: Sad, Worthless, low self-esteem Affect: Appropriate to circumstance Anxiety Level: Minimal Thought Processes: Coherent Judgement: Unimpaired Orientation: Person, Place, Time, Situation, Appropriate for developmental age Obsessive Compulsive Thoughts/Behaviors: None  Cognitive Functioning Concentration: Normal Memory: Recent Intact, Remote Intact Is patient IDD: No Insight: Fair Impulse Control: Fair Appetite: Fair Have you had any weight changes? : No Change Sleep: Decreased Total Hours of Sleep: 3 Vegetative Symptoms: None  ADLScreening [facility]) Patient's cognitive ability adequate to safely complete daily activities?: Yes Patient able to express need for assistance with ADLs?: Yes Independently performs ADLs?: Yes (appropriate for developmental age)  Prior Inpatient Therapy Prior Inpatient Therapy: Yes Prior Therapy Dates: 03/15/2019,06/29/2018,09/23/2017,10/04/2016 Prior Therapy Facilty/Provider(s): [facility] Reason for Treatment: Schizoaffective Disorder, Bizarre Behaviors, Acute Psychosis  Prior Outpatient Therapy Prior Outpatient Therapy: Yes Prior Therapy Dates: 2020 Prior Therapy Facilty/Provider(s): [facility] Reason for Treatment: Schizophrenia, Bipolar, SA Does patient have an ACCT team?: No Does patient have Intensive In-House Services?  : No Does patient have [facility] services? : No Does patient have P4CC services?: No  ADL Screening (condition at time of admission) Patient's cognitive ability adequate to safely complete daily activities?: Yes Is the patient deaf or have difficulty hearing?: No Does the patient have difficulty seeing, even when wearing glasses/contacts?: No Does the patient have difficulty concentrating, remembering, or making decisions?: No Patient able to express need for assistance with ADLs?: Yes Does the patient have difficulty dressing or bathing?: No Independently performs ADLs?: Yes  (appropriate for developmental age)  Does the patient have difficulty walking or climbing stairs?: No Weakness of Legs: None Weakness of Arms/Hands: None  Home Assistive Devices/Equipment Home Assistive Devices/Equipment: None  Therapy Consults (therapy consults require a physician order) PT Evaluation Needed: No OT Evalulation Needed: No SLP Evaluation Needed: No Abuse/Neglect Assessment (Assessment to be complete while patient is alone) Abuse/Neglect Assessment Can Be Completed: Yes Physical Abuse: Denies Verbal Abuse: Denies Sexual Abuse: Denies Exploitation of patient/patient's resources: Denies Self-Neglect: Denies Values / Beliefs Cultural Requests During Hospitalization: None Spiritual Requests During Hospitalization: None Consults Spiritual Care Consult Needed: No Transition of Care Team Consult Needed: No Advance Directives (For Healthcare) Does Patient Have a Medical Advance Directive?: No Would patient like information on creating a medical advance directive?: No - Patient declined          Disposition: Per Psyc NP patient will be observed overnight and reassessed in the morning. Disposition Initial Assessment Completed for this Encounter: Yes  On Site Evaluation by:   Reviewed with Physician:    Leonie Douglas MS LCASA 07/01/2019 1:17 AM

## 2019-07-01 NOTE — ED Notes (Signed)
Pt. Introduced to unit.  Pt. States she has been here before.  Pt. Advised of cameras, safety checks and bathroom usage.  Pt. Requested and was given drink.  Pt. Calm and cooperative.

## 2019-07-01 NOTE — ED Provider Notes (Signed)
2:04 PM Patient has been seen and evaluated by psychiatry team and deemed appropriate for discharge.  She does not demonstrate an imminent threat to herself or others.  Patient does not meet criteria for IVC at this time. Psychiatry did offer an inpatient stay, however she prefers follow-up with outpatient resources.  This is determined to be appropriate per psychiatry and she was provided with resources.  Will plan for discharge.    Miguel Aschoff., MD 07/01/19 [phone]

## 2019-07-01 NOTE — Consult Note (Signed)
[facility] Face-to-Face Psychiatry Consult   Reason for Consult: Psychiatric Evaluation Referring Physician: Dr. Lenard Lance  Patient Identification: Theresa [name] MRN:  [id number] Principal Diagnosis: <principal problem not specified> Diagnosis:  Active Problems:   Tobacco use disorder   Cannabis use disorder, moderate, dependence (HCC)   PCOS (polycystic ovarian syndrome)   Bipolar affective disorder, mixed, severe, with psychotic behavior (HCC)   Undifferentiated schizophrenia (HCC)   UTI (urinary tract infection)   Schizoaffective disorder, bipolar type (HCC)   Psychotic disorder (HCC)    Patient was reassessed this morning after giving last night to rest.  Upon reevaluation patient was clear and coherent denied any psychotic symptoms including any hallucinations or paranoia.  She also denied any mood symptoms including depressed mood or suicidal ideation.  She reports feeling better and requesting to leave the hospital.  Theresa [name] was had with the patient regarding the necessity for her to have care.  Patient feels that she would be better managed on outpatient basis.  She cites multiple traumas from her previous inpatient stay as well as points to her current status which is without symptoms.  Discussed with patient that that she does not meet criteria for IVC but that she would benefit from an inpatient stay patient remains adamant to leave the hospital at this time and reports that she will return if symptoms worsen.  Patient will be provided with outpatient resources.    Patient to be discharged.  Patient was here on Theresa voluntary basis, therefore no IVC to recent Dr. Cindi Carbon  Original NP note as follows below:  Total Time spent with patient: 1 hour  Subjective: "I have been hearing voices and decided to come to the hospital so I can get back on my antipsychotic medications." Theresa [name] is Theresa 24 y.o. female patient presented to [facility] ED via POV voluntary.     The patient report auditory hallucinations. She revealed it is Theresa female and Theresa female with demonic conversations. She also reports poor sleep and concentration. The patient also noted that she has been using drugs.  She voiced heroin and last used it today (05/30/19). The provided that she had been diagnosed with Bipolar Disorder with manic episode and Schizophrenia with catatonic behavior in 2017.   The patient was seen face-to-face by this provider; chart reviewed and consulted with Dr. Lenard Lance on 05/30/2019 due to the patient's care. It was discussed with both providers that the patient will remain under observation overnight and reassess in the Theresa.m. to determine if she meets the criteria for psychiatric inpatient admission or she could be discharged back home with outpatient resources. The patient is alert and oriented x 4, calm, cooperative, and mood-congruent with affect on evaluation. The patient does appear to be responding to internal and external stimuli. She is not presenting with any delusional thinking. The patient admits to auditory hallucinations. The patient denies suicidal, homicidal, or self-harm ideations. The patient is not presenting with any psychotic or paranoid behaviors. During an encounter with the patient, she was able to answer questions appropriately. Collateral was obtained by her mother, Theresa [name] [phone]), who expresses concerns for the patient's mental health. Theresa [name] was adamantly against the patient being discharged home tonight (06/30/19).  She voiced that she does not think her daughter is safe and is concerned about her mental state.  She expressed, "my daughter needs to be on some antipsychotic medications."  She voiced that the patient has not been sleeping.  She said the patient becomes catatonic  to the point where she will not use the bathroom for days.  She states that her daughter needs to be admitted to the inpatient unit to receive care.  She  expressed her daughter has been experiencing auditory hallucinations, which she shared the voices are female and female and they are saying bad things to her.  She said her daughter was on Lamictal, which helped the voices. Plan: The patient will remain overnight and reassess in the Theresa.m. to determine if she meets the criteria for psychiatric inpatient admission or could be discharged back home with outpatient resources.   HPI: Theresa [name] is Theresa 24 y.o. female with Theresa [name] medical history of anxiety, bipolar, schizophrenia, presents to the emergency department hearing voices.  According to the patient for the past several months she has been hearing voices which are getting worse.  Patient also states she has been using heroin, last use was earlier today and is hoping to detox off of heroin.  Patient denies any SI or HI.  Has no medical complaints at this time.  Largely negative review of systems including no fever cough congestion or shortness of breath.  Past Psychiatric History:  Anxiety Bipolar disorder (HCC) Depression Psychosis (HCC) Schizophrenia (HCC)  Risk to Self: Suicidal Ideation: No Suicidal Intent: No Is patient at risk for suicide?: No Suicidal Plan?: No Access to Means: No What has been your use of drugs/alcohol within the last 12 months?: Heroin use Other Self Harm Risks: Substance abuse Intentional Self Injurious Behavior: NoneNo Risk to Others: Homicidal Ideation: No Thoughts of Harm to Others: No Current Homicidal Intent: No Current Homicidal Plan: No Access to Homicidal Means: No History of harm to others?: No Assessment of Violence: None Noted Does patient have access to weapons?: No Criminal Charges Pending?: No Does patient have Theresa court date: NoNo Prior Inpatient Therapy: Prior Inpatient Therapy: Yes Prior Therapy Dates: 03/15/2019,06/29/2018,09/23/2017,10/04/2016 Prior Therapy [facility]ilty/Provider([facility] Reason for Treatment: Schizoaffective Disorder,  Bizarre Behaviors, Acute Psychosis Yes Prior Outpatient Therapy: Prior Outpatient Therapy: Yes Prior Therapy Dates: 2020 Prior Therapy Facilty/Provider(s): [facility] Reason for Treatment: Schizophrenia, Bipolar, SA Does patient have an ACCT team?: No Does patient have Intensive In-House Services?  : No Does patient have [facility] services? : No Does patient have P4CC services?: No Yes  Past Medical History:  Past Medical History:  Diagnosis Date  . Abnormal vaginal Pap smear   . Anxiety   . Bipolar disorder (HCC)   . Depression   . Migraine without aura   . Polycystic ovary syndrome   . Psychosis (HCC)   . Schizophrenia (HCC)   . STD (sexually transmitted disease) 10-14-15   chlamydia    History reviewed. No pertinent surgical history. Family History:  Family History  Problem Relation Age of Onset  . Hypertension Mother   . Heart disease Mother   . Cancer Mother   . Hypertension Other   . Depression Father   . Drug abuse Father   . Bipolar disorder Father   . Heart disease Maternal Grandmother   . Pancreatitis Maternal Grandfather   . Cancer Paternal Grandmother   . Cancer Paternal Grandfather     Family Psychiatric  History: Mother- bipolar; dad-schizophrenia Social History:  Social History   Substance and Sexual Activity  Alcohol Use Yes  . Alcohol/week: 0.0 standard drinks     Social History   Substance and Sexual Activity  Drug Use Yes  . Frequency: 2.0 times per week  . Types: Marijuana,  Methamphetamines, Cocaine   Comment: meth, heroin, crack    Social History   Socioeconomic History  . Marital status: Legally Separated    Spouse name: Not on file  . Number of children: Not on file  . Years of education: Not on file  . Highest education level: Not on file  Occupational History  . Not on file  Tobacco Use  . Smoking status: Current Every Day Smoker    Packs/day: 0.50    Types: Cigarettes  . Smokeless tobacco: Never Used   Substance and Sexual Activity  . Alcohol use: Yes    Alcohol/week: 0.0 standard drinks  . Drug use: Yes    Frequency: 2.0 times per week    Types: Marijuana, Methamphetamines, Cocaine    Comment: meth, heroin, crack  . Sexual activity: Yes    Partners: Male    Birth control/protection: None  Other Topics Concern  . Not on file  Social History Narrative  . Not on file   Social Determinants of Health   Financial Resource Strain:   . Difficulty of Paying Living Expenses: Not on file  Food Insecurity:   . Worried About Programme researcher, broadcasting/film/video in the Last Year: Not on file  . Ran Out of Food in the Last Year: Not on file  Transportation Needs:   . Lack of Transportation (Medical): Not on file  . Lack of Transportation (Non-Medical): Not on file  Physical Activity:   . Days of Exercise per Week: Not on file  . Minutes of Exercise per Session: Not on file  Stress:   . Feeling of Stress : Not on file  Social Connections:   . Frequency of Communication with Friends and Family: Not on file  . Frequency of Social Gatherings with Friends and Family: Not on file  . Attends Religious Services: Not on file  . Active Member of Clubs or Organizations: Not on file  . Attends Banker Meetings: Not on file  . Marital Status: Not on file   Additional Social History:    Allergies:   Allergies  Allergen Reactions  . Seroquel [Quetiapine]     Labs:  Results for orders placed or performed during the hospital encounter of 06/30/19 (from the past 48 hour(s))  Comprehensive metabolic panel     Status: Abnormal   Collection Time: 06/30/19  8:10 PM  Result Value Ref Range   Sodium 140 135 - 145 mmol/L   Potassium 3.4 (L) 3.5 - 5.1 mmol/L   Chloride 105 98 - 111 mmol/L   CO2 29 22 - 32 mmol/L   Glucose, Bld 151 (H) 70 - 99 mg/dL   BUN 9 6 - 20 mg/dL   Creatinine, Ser 3.54 0.44 - 1.00 mg/dL   Calcium 8.9 8.9 - 56.2 mg/dL   Total Protein 7.4 6.5 - 8.1 g/dL   Albumin 4.3 3.5 -  5.0 g/dL   AST 16 15 - 41 U/L   ALT 18 0 - 44 U/L   Alkaline Phosphatase 109 38 - 126 U/L   Total Bilirubin 0.6 0.3 - 1.2 mg/dL   GFR calc non Af Amer >60 >60 mL/min   GFR calc Af Amer >60 >60 mL/min   Anion gap 6 5 - 15    Comment: Performed at [facility], [address], [place], [place] [address]  Ethanol     Status: None   Collection Time: 06/30/19  8:10 PM  Result Value Ref Range   Alcohol, Ethyl (B) <  10 <10 mg/dL    Comment: (NOTE) Lowest detectable limit for serum alcohol is 10 mg/dL. For medical purposes only. Performed at [facility], [address] [place], [place] [address]   Salicylate level     Status: Abnormal   Collection Time: 06/30/19  8:10 PM  Result Value Ref Range   Salicylate Lvl <4.6 (L) 7.0 - 30.0 mg/dL    Comment: Performed at [facility], [address] [place], [place] [address]  Acetaminophen level     Status: Abnormal   Collection Time: 06/30/19  8:10 PM  Result Value Ref Range   Acetaminophen (Tylenol), Serum <10 (L) 10 - 30 ug/mL    Comment: (NOTE) Therapeutic concentrations vary significantly. Theresa range of 10-30 ug/mL  may be an effective concentration for many patients. However, some  are best treated at concentrations outside of this range. Acetaminophen concentrations >150 ug/mL at 4 hours after ingestion  and >50 ug/mL at 12 hours after ingestion are often associated with  toxic reactions. Performed at [facility], [address] [place], [place] [address]   cbc     Status: Abnormal   Collection Time: 06/30/19  8:10 PM  Result Value Ref Range   WBC 14.0 (H) 4.0 - 10.5 K/uL   RBC 4.94 3.87 - 5.11 MIL/uL   Hemoglobin 15.1 (H) 12.0 - 15.0 g/dL   HCT 44.4 36.0 - 46.0 %   MCV 89.9 80.0 - 100.0 fL   MCH 30.6 26.0 - 34.0 pg   MCHC 34.0 30.0 - 36.0 g/dL   RDW 12.4 11.5 - 15.5 %   Platelets 343 150 - 400 K/uL   nRBC 0.0 0.0 - 0.2 %    Comment: Performed at [facility], [address] [place], [place] [address]  Respiratory Panel by RT PCR (Flu Theresa&B, Covid) - Nasopharyngeal Swab     Status: None   Collection Time: 07/01/19 12:01 AM   Specimen: Nasopharyngeal Swab  Result Value Ref Range   SARS Coronavirus 2 by RT PCR NEGATIVE NEGATIVE    Comment: (NOTE) SARS-CoV-2 target nucleic acids are NOT DETECTED. The SARS-CoV-2 RNA is generally detectable in upper respiratoy specimens during the acute phase of infection. The lowest concentration of SARS-CoV-2 viral copies this assay can detect is 131 copies/mL. Theresa negative result does not preclude SARS-Cov-2 infection and should not be used as the sole basis for treatment or other patient management decisions. Theresa negative result may occur with  improper specimen collection/handling, submission of specimen other than nasopharyngeal swab, presence of viral mutation(s) within the areas targeted by this assay, and inadequate number of viral copies (<131 copies/mL). Theresa negative result must be combined with clinical observations, patient history, and epidemiological information. The expected result is Negative. Fact Sheet for Patients:  PinkCheek.be Fact Sheet for Healthcare Providers:  GravelBags.it This test is not yet ap proved or cleared by the [place] FDA and  has been authorized for detection and/or diagnosis of SARS-CoV-2 by FDA under an Emergency Use Authorization (EUA). This EUA will remain  in effect (meaning this test can be used) for the duration of the COVID-19 declaration under Section 564(b)(1) of the Act, 21 U.S.C. section 360bbb-3(b)(1), unless the authorization is terminated or revoked sooner.    Influenza Theresa by PCR NEGATIVE NEGATIVE   Influenza B by PCR NEGATIVE NEGATIVE    Comment: (NOTE) The Xpert Xpress SARS-CoV-2/FLU/RSV assay is intended as an aid in  the diagnosis of influenza from Nasopharyngeal swab specimens  and  should not be used as Theresa  sole basis for treatment. Nasal washings and  aspirates are unacceptable for Xpert Xpress SARS-CoV-2/FLU/RSV  testing. Fact Sheet for Patients: https://www.moore.com/ Fact Sheet for Healthcare Providers: https://www.young.biz/ This test is not yet approved or cleared by the [place] FDA and  has been authorized for detection and/or diagnosis of SARS-CoV-2 by  FDA under an Emergency Use Authorization (EUA). This EUA will remain  in effect (meaning this test can be used) for the duration of the  Covid-19 declaration under Section 564(b)(1) of the Act, 21  U.S.C. section 360bbb-3(b)(1), unless the authorization is  terminated or revoked. Performed at [facility], [address] [place], [place] [address]     No current facility-administered medications for this encounter.   Current Outpatient Medications  Medication Sig Dispense Refill  . benztropine (COGENTIN) 1 MG tablet Take 1 tablet (1 mg total) by mouth 2 (two) times daily. (Patient not taking: Reported on 07/01/2019) 60 tablet 1  . paliperidone (INVEGA SUSTENNA) 234 MG/1.5ML SUSY injection Inject 234 mg into the muscle every 28 (twenty-eight) days. Next injection on 06/29/2018 (Patient not taking: Reported on 07/01/2019) 1.8 mL 1  . paliperidone (INVEGA) 6 MG 24 hr tablet Take 1 tablet (6 mg total) by mouth daily with supper. (Patient not taking: Reported on 07/01/2019) 30 tablet 1  . traZODone (DESYREL) 100 MG tablet Take 1 tablet (100 mg total) by mouth at bedtime. (Patient not taking: Reported on 07/01/2019) 30 tablet 1    Musculoskeletal: Strength & Muscle Tone: within normal limits Gait & Station: normal Patient leans: N/Theresa  Psychiatric Specialty Exam: Physical Exam  Nursing note and vitals reviewed. Constitutional: She is oriented to person, place, and time. She appears well-developed and well-nourished.  Cardiovascular: Normal rate.  Respiratory: Effort normal.   Musculoskeletal:        General: Normal range of motion.     Cervical back: Normal range of motion and neck supple.  Neurological: She is alert and oriented to person, place, and time.    Review of Systems  Psychiatric/Behavioral: Positive for hallucinations and sleep disturbance. The patient is nervous/anxious.   All other systems reviewed and are negative.   Blood pressure (!) 109/55, pulse 83, temperature 97.8 F (36.6 C), temperature source Oral, resp. rate 18, height 5' 3.5" (1.613 m), weight 90.7 kg, SpO2 98 %.Body mass index is 34.87 kg/m.  General Appearance: Casual  Eye Contact:  Fair  Speech:  Clear and Coherent  Volume:  Normal  Mood:  Anxious, Depressed and Euphoric  Affect:  Blunt, Congruent, Depressed and Flat  Thought Process:  Coherent  Orientation:  Full (Time, Place, and Person)  Thought Content:  Logical and Hallucinations: Auditory  Suicidal Thoughts:  No  Homicidal Thoughts:  No  Memory:  Immediate;   Good Recent;   Good Remote;   Good  Judgement:  Fair  Insight:  Lacking  Psychomotor Activity:  Decreased  Concentration:  Concentration: Good and Attention Span: Good  Recall:  Good  Fund of Knowledge:  Good  Language:  Good  Akathisia:  Negative  Handed:  Right  AIMS (if indicated):     Assets:  Desire for Improvement Financial Resources/Insurance Resilience Social Support  ADL's:  Intact  Cognition:  WNL  Sleep:    Insomnia     Treatment Plan Summary: Daily contact with patient to assess and evaluate symptoms and progress in treatment, Medication management and Plan The patient will remain under observation overnight and  reassess in the Theresa.m. to determine if she meets criteria for psychiatric inpatient admission or can be discharged home.  Disposition: Supportive therapy provided about ongoing stressors. The patient will remain on the observation overnight and reassess in the Theresa.m. to determine if she meets criteria for psychiatric inpatient  admission or could be discharged back home.  Clement Sayres, MD 07/01/2019 11:49 AM

## 2019-07-01 NOTE — ED Notes (Signed)
Pt ate 80% of breakfast.

## 2019-07-01 NOTE — ED Notes (Signed)
This RN spoke with mom. Pt states that she doesn't want any information given to mother. No medical information shared with mom. Mom told that pt will be d/c later today. Mother began to share concerns about pt being psychotic. Pt has denied any voices today or last night. Pt pleasant, cooperative, and AOx4. Phone handed to Dr. Cindi Carbon.

## 2019-07-01 NOTE — ED Notes (Signed)
Pt mother Jabier Mutton called asking about her daughter being discharged. This tech let her know that for an update and any questions regarding discharge would have to be discussed with the nurse or doctor and I could not give her an answer regarding that but if she would like to speak to her daughter I could have her call. This tech also took her number down for the nurse to call hr back. Debra asked who pt nurse was I informed her this morning pt nurse was [place] and she said " well if she gets discharged and I cant find her I know who to come beat up" and hung phone up.

## 2019-07-01 NOTE — Consult Note (Addendum)
[facility] Face-to-Face Psychiatry Consult   Reason for Consult: Psychiatric Evaluation Referring Physician: Dr. Lenard Lance  Patient Identification: Theresa [name] MRN:  [id number] Principal Diagnosis: <principal problem not specified> Diagnosis:  Active Problems:   Tobacco use disorder   Cannabis use disorder, moderate, dependence (HCC)   PCOS (polycystic ovarian syndrome)   Bipolar affective disorder, mixed, severe, with psychotic behavior (HCC)   Undifferentiated schizophrenia (HCC)   UTI (urinary tract infection)   Schizoaffective disorder, bipolar type (HCC)   Psychotic disorder (HCC)   Total Time spent with patient: 1 hour  Subjective: "I have been hearing voices and decided to come to the hospital so I can get back on my antipsychotic medications." Theresa [name] is a 24 y.o. female patient presented to [facility] ED via POV voluntary.    The patient report auditory hallucinations. She revealed it is a female and a female with demonic conversations. She also reports poor sleep and concentration. The patient also noted that she has been using drugs.  She voiced heroin and last used it today (05/30/19). The provided that she had been diagnosed with Bipolar Disorder with manic episode and Schizophrenia with catatonic behavior in 2017.   The patient was seen face-to-face by this provider; chart reviewed and consulted with Dr. Lenard Lance on 05/30/2019 due to the patient's care. It was discussed with both providers that the patient will remain under observation overnight and reassess in the a.m. to determine if she meets the criteria for psychiatric inpatient admission or she could be discharged back home with outpatient resources. The patient is alert and oriented x 4, calm, cooperative, and mood-congruent with affect on evaluation. The patient does appear to be responding to internal and external stimuli. She is not presenting with any delusional thinking. The patient admits to auditory  hallucinations. The patient denies suicidal, homicidal, or self-harm ideations. The patient is not presenting with any psychotic or paranoid behaviors. During an encounter with the patient, she was able to answer questions appropriately. Collateral was obtained by her mother, Ms. Jabier Mutton [phone]), who expresses concerns for the patient's mental health. Ms. Alona Bene was adamantly against the patient being discharged home tonight (06/30/19).  She voiced that she does not think her daughter is safe and is concerned about her mental state.  She expressed, "my daughter needs to be on some antipsychotic medications."  She voiced that the patient has not been sleeping.  She said the patient becomes catatonic to the point where she will not use the bathroom for days.  She states that her daughter needs to be admitted to the inpatient unit to receive care.  She expressed her daughter has been experiencing auditory hallucinations, which she shared the voices are female and female and they are saying bad things to her.  She said her daughter was on Lamictal, which helped the voices. Plan: The patient will remain overnight and reassess in the a.m. to determine if she meets the criteria for psychiatric inpatient admission or could be discharged back home with outpatient resources.   HPI: Theresa [name] is a 24 y.o. female with a past medical history of anxiety, bipolar, schizophrenia, presents to the emergency department hearing voices.  According to the patient for the past several months she has been hearing voices which are getting worse.  Patient also states she has been using heroin, last use was earlier today and is hoping to detox off of heroin.  Patient denies any SI or HI.  Has no medical complaints at this  time.  Largely negative review of systems including no fever cough congestion or shortness of breath.  Past Psychiatric History:  Anxiety Bipolar disorder (HCC) Depression Psychosis  (HCC) Schizophrenia (HCC)  Risk to Self:  No Risk to Others:  No Prior Inpatient Therapy:   Yes Prior Outpatient Therapy:   Yes  Past Medical History:  Past Medical History:  Diagnosis Date  . Abnormal vaginal Pap smear   . Anxiety   . Bipolar disorder (HCC)   . Depression   . Migraine without aura   . Polycystic ovary syndrome   . Psychosis (HCC)   . Schizophrenia (HCC)   . STD (sexually transmitted disease) 10-14-15   chlamydia    History reviewed. No pertinent surgical history. Family History:  Family History  Problem Relation Age of Onset  . Hypertension Mother   . Heart disease Mother   . Cancer Mother   . Hypertension Other   . Depression Father   . Drug abuse Father   . Bipolar disorder Father   . Heart disease Maternal Grandmother   . Pancreatitis Maternal Grandfather   . Cancer Paternal Grandmother   . Cancer Paternal Grandfather     Family Psychiatric  History: Mother- bipolar; dad-schizophrenia Social History:  Social History   Substance and Sexual Activity  Alcohol Use Yes  . Alcohol/week: 0.0 standard drinks     Social History   Substance and Sexual Activity  Drug Use Yes  . Frequency: 2.0 times per week  . Types: Marijuana, Methamphetamines, Cocaine   Comment: meth, heroin, crack    Social History   Socioeconomic History  . Marital status: Legally Separated    Spouse name: Not on file  . Number of children: Not on file  . Years of education: Not on file  . Highest education level: Not on file  Occupational History  . Not on file  Tobacco Use  . Smoking status: Current Every Day Smoker    Packs/day: 0.50    Types: Cigarettes  . Smokeless tobacco: Never Used  Substance and Sexual Activity  . Alcohol use: Yes    Alcohol/week: 0.0 standard drinks  . Drug use: Yes    Frequency: 2.0 times per week    Types: Marijuana, Methamphetamines, Cocaine    Comment: meth, heroin, crack  . Sexual activity: Yes    Partners: Male    Birth  control/protection: None  Other Topics Concern  . Not on file  Social History Narrative  . Not on file   Social Determinants of Health   Financial Resource Strain:   . Difficulty of Paying Living Expenses: Not on file  Food Insecurity:   . Worried About Programme researcher, broadcasting/film/video in the Last Year: Not on file  . Ran Out of Food in the Last Year: Not on file  Transportation Needs:   . Lack of Transportation (Medical): Not on file  . Lack of Transportation (Non-Medical): Not on file  Physical Activity:   . Days of Exercise per Week: Not on file  . Minutes of Exercise per Session: Not on file  Stress:   . Feeling of Stress : Not on file  Social Connections:   . Frequency of Communication with Friends and Family: Not on file  . Frequency of Social Gatherings with Friends and Family: Not on file  . Attends Religious Services: Not on file  . Active Member of Clubs or Organizations: Not on file  . Attends Banker Meetings: Not on file  .  Marital Status: Not on file   Additional Social History:    Allergies:   Allergies  Allergen Reactions  . Seroquel [Quetiapine]     Labs:  Results for orders placed or performed during the hospital encounter of 06/30/19 (from the past 48 hour(s))  Comprehensive metabolic panel     Status: Abnormal   Collection Time: 06/30/19  8:10 PM  Result Value Ref Range   Sodium 140 135 - 145 mmol/L   Potassium 3.4 (L) 3.5 - 5.1 mmol/L   Chloride 105 98 - 111 mmol/L   CO2 29 22 - 32 mmol/L   Glucose, Bld 151 (H) 70 - 99 mg/dL   BUN 9 6 - 20 mg/dL   Creatinine, Ser 0.86 0.44 - 1.00 mg/dL   Calcium 8.9 8.9 - 10.3 mg/dL   Total Protein 7.4 6.5 - 8.1 g/dL   Albumin 4.3 3.5 - 5.0 g/dL   AST 16 15 - 41 U/L   ALT 18 0 - 44 U/L   Alkaline Phosphatase 109 38 - 126 U/L   Total Bilirubin 0.6 0.3 - 1.2 mg/dL   GFR calc non Af Amer >60 >60 mL/min   GFR calc Af Amer >60 >60 mL/min   Anion gap 6 5 - 15    Comment: Performed at [facility],  [address] [place], [place] [address]  Ethanol     Status: None   Collection Time: 06/30/19  8:10 PM  Result Value Ref Range   Alcohol, Ethyl (B) <10 <10 mg/dL    Comment: (NOTE) Lowest detectable limit for serum alcohol is 10 mg/dL. For medical purposes only. Performed at [facility], [address] [place], [place] [address]   Salicylate level     Status: Abnormal   Collection Time: 06/30/19  8:10 PM  Result Value Ref Range   Salicylate Lvl <8.1 (L) 7.0 - 30.0 mg/dL    Comment: Performed at [facility], [address] [place], [place] [address]  Acetaminophen level     Status: Abnormal   Collection Time: 06/30/19  8:10 PM  Result Value Ref Range   Acetaminophen (Tylenol), Serum <10 (L) 10 - 30 ug/mL    Comment: (NOTE) Therapeutic concentrations vary significantly. A range of 10-30 ug/mL  may be an effective concentration for many patients. However, some  are best treated at concentrations outside of this range. Acetaminophen concentrations >150 ug/mL at 4 hours after ingestion  and >50 ug/mL at 12 hours after ingestion are often associated with  toxic reactions. Performed at [facility], [address] [place], [place] [address]   cbc     Status: Abnormal   Collection Time: 06/30/19  8:10 PM  Result Value Ref Range   WBC 14.0 (H) 4.0 - 10.5 K/uL   RBC 4.94 3.87 - 5.11 MIL/uL   Hemoglobin 15.1 (H) 12.0 - 15.0 g/dL   HCT 44.4 36.0 - 46.0 %   MCV 89.9 80.0 - 100.0 fL   MCH 30.6 26.0 - 34.0 pg   MCHC 34.0 30.0 - 36.0 g/dL   RDW 12.4 11.5 - 15.5 %   Platelets 343 150 - 400 K/uL   nRBC 0.0 0.0 - 0.2 %    Comment: Performed at [facility], [address] [place], [place] [address]    No current facility-administered medications for this encounter.   Current Outpatient Medications  Medication Sig Dispense Refill  . benztropine (COGENTIN) 1 MG tablet Take 1 tablet (1 mg total) by  mouth 2 (two) times daily. 60 tablet 1   . paliperidone (INVEGA SUSTENNA) 234 MG/1.5ML SUSY injection Inject 234 mg into the muscle every 28 (twenty-eight) days. Next injection on 06/29/2018 1.8 mL 1  . paliperidone (INVEGA) 6 MG 24 hr tablet Take 1 tablet (6 mg total) by mouth daily with supper. 30 tablet 1  . traZODone (DESYREL) 100 MG tablet Take 1 tablet (100 mg total) by mouth at bedtime. 30 tablet 1    Musculoskeletal: Strength & Muscle Tone: within normal limits Gait & Station: normal Patient leans: N/A  Psychiatric Specialty Exam: Physical Exam  Nursing note and vitals reviewed. Constitutional: She is oriented to person, place, and time. She appears well-developed and well-nourished.  Cardiovascular: Normal rate.  Respiratory: Effort normal.  Musculoskeletal:        General: Normal range of motion.     Cervical back: Normal range of motion and neck supple.  Neurological: She is alert and oriented to person, place, and time.    Review of Systems  Psychiatric/Behavioral: Positive for hallucinations and sleep disturbance. The patient is nervous/anxious.   All other systems reviewed and are negative.   Blood pressure 130/70, pulse 88, temperature 97.8 F (36.6 C), temperature source Oral, resp. rate 18, height 5' 3.5" (1.613 m), weight 90.7 kg, SpO2 97 %.Body mass index is 34.87 kg/m.  General Appearance: Casual  Eye Contact:  Fair  Speech:  Clear and Coherent  Volume:  Normal  Mood:  Anxious, Depressed and Euphoric  Affect:  Blunt, Congruent, Depressed and Flat  Thought Process:  Coherent  Orientation:  Full (Time, Place, and Person)  Thought Content:  Logical and Hallucinations: Auditory  Suicidal Thoughts:  No  Homicidal Thoughts:  No  Memory:  Immediate;   Good Recent;   Good Remote;   Good  Judgement:  Fair  Insight:  Lacking  Psychomotor Activity:  Decreased  Concentration:  Concentration: Good and Attention Span: Good  Recall:  Good  Fund of Knowledge:  Good  Language:  Good  Akathisia:  Negative   Handed:  Right  AIMS (if indicated):     Assets:  Desire for Improvement Financial Resources/Insurance Resilience Social Support  ADL's:  Intact  Cognition:  WNL  Sleep:    Insomnia     Treatment Plan Summary: Daily contact with patient to assess and evaluate symptoms and progress in treatment, Medication management and Plan The patient will remain under observation overnight and reassess in the a.m. to determine if she meets criteria for psychiatric inpatient admission or can be discharged home.  Disposition: Supportive therapy provided about ongoing stressors. The patient will remain on the observation overnight and reassess in the a.m. to determine if she meets criteria for psychiatric inpatient admission or could be discharged back home.  Gillermo Murdoch, NP 07/01/2019 12:15 AM

## 2019-07-01 NOTE — ED Notes (Signed)
Pt given orange juice.

## 2019-07-06 ENCOUNTER — Ambulatory Visit (HOSPITAL_COMMUNITY)
Admission: RE | Admit: 2019-07-06 | Discharge: 2019-07-06 | Disposition: A | Discharge status: Home / Self Care | Payer: Medicare Other | Attending: Psychiatry | Admitting: Psychiatry

## 2019-07-06 NOTE — BHH Counselor (Signed)
Pt left [facility] before her TTS assessment was completed by clinician. Pt was assessed by NP.    Redmond Pulling, MS, [facility], [facility] Triage Specialist [phone]

## 2019-07-06 NOTE — H&P (Signed)
Behavioral Health Medical Screening Exam  Theresa [name] is an 24 y.o. female that present to [facility] with mom.  Mom brought patient to the [facility] because mom feels that patient has not been "acting right".  Patient was just discharged from [facility] with outpatient resources.  Mom states that refused to talk to counselor on the phone today. Advised patient she does not meet the criteria for IVC and if help is wanted, she would need to participate in her plan of car. Patient understood. Mom became angry because despite the patient presentation, she feels that patient needs to be hospitalized.  Patient denies SI and HI.  Patient denies VH but endorses AH. She states the voices are not consistent and are not command AH.  Patient is psych cleared at this time. Encouraged to follow-up with outpatient resources.    Total Time spent with patient: 1 hour  Psychiatric Specialty Exam: Physical Exam  Nursing note and vitals reviewed. Constitutional: She is oriented to person, place, and time. She appears well-developed.  Eyes: Pupils are equal, round, and reactive to light.  Respiratory: Effort normal.  Musculoskeletal:        General: Normal range of motion.     Cervical back: Normal range of motion.  Neurological: She is alert and oriented to person, place, and time.  Skin: Skin is warm and dry.  Psychiatric: She has a normal mood and affect. Her speech is normal and behavior is normal. Judgment and thought content normal. Cognition and memory are normal.    Review of Systems  Psychiatric/Behavioral: Positive for hallucinations. Negative for confusion, self-injury and suicidal ideas.  All other systems reviewed and are negative.   There were no vitals taken for this visit.There is no height or weight on file to calculate BMI.  General Appearance: Casual  Eye Contact:  Good  Speech:  Clear and Coherent  Volume:  Normal  Mood:  Euthymic  Affect:  Congruent  Thought Process:  Coherent and Descriptions of  Associations: Intact  Orientation:  Full (Time, Place, and Person)  Thought Content:  WDL  Suicidal Thoughts:  No  Homicidal Thoughts:  No  Memory:  Immediate;   Good  Judgement:  Fair  Insight:  Good  Psychomotor Activity:  Normal  Concentration: Concentration: Good  Recall:  Good  Fund of Knowledge:Good  Language: Good  Akathisia:  NA  Handed:  Right  AIMS (if indicated):     Assets:  Manufacturing systems engineer Social Support  Sleep:   lacking sleep at this time    Musculoskeletal: Strength & Muscle Tone: within normal limits Gait & Station: normal Patient leans: N/A  There were no vitals taken for this visit.  Recommendations:  Based on my evaluation the patient does not appear to have an emergency medical condition. Patient is Psych cleared and does not meet criteria for inpatient hospitalization  Jearld Lesch, NP 07/06/2019, 4:07 AM

## 2019-07-09 ENCOUNTER — Other Ambulatory Visit: Payer: Self-pay

## 2019-07-09 ENCOUNTER — Encounter (HOSPITAL_COMMUNITY): Payer: Self-pay | Admitting: Emergency Medicine

## 2019-07-09 ENCOUNTER — Emergency Department (HOSPITAL_COMMUNITY)
Admission: EM | Admit: 2019-07-09 | Discharge: 2019-07-09 | Discharge status: Against Medical Advice | Payer: Medicare Other | Attending: Emergency Medicine | Admitting: Emergency Medicine

## 2019-07-09 DIAGNOSIS — F1510 Other stimulant abuse, uncomplicated: Secondary | ICD-10-CM | POA: Insufficient documentation

## 2019-07-09 DIAGNOSIS — F3110 Bipolar disorder, current episode manic without psychotic features, unspecified: Secondary | ICD-10-CM | POA: Insufficient documentation

## 2019-07-09 DIAGNOSIS — F17210 Nicotine dependence, cigarettes, uncomplicated: Secondary | ICD-10-CM | POA: Insufficient documentation

## 2019-07-09 DIAGNOSIS — F250 Schizoaffective disorder, bipolar type: Secondary | ICD-10-CM | POA: Diagnosis not present

## 2019-07-09 DIAGNOSIS — F1410 Cocaine abuse, uncomplicated: Secondary | ICD-10-CM | POA: Diagnosis not present

## 2019-07-09 DIAGNOSIS — F1220 Cannabis dependence, uncomplicated: Secondary | ICD-10-CM | POA: Diagnosis not present

## 2019-07-09 DIAGNOSIS — R440 Auditory hallucinations: Secondary | ICD-10-CM | POA: Insufficient documentation

## 2019-07-09 DIAGNOSIS — Z5320 Procedure and treatment not carried out because of patient's decision for unspecified reasons: Secondary | ICD-10-CM | POA: Diagnosis not present

## 2019-07-09 DIAGNOSIS — F1110 Opioid abuse, uncomplicated: Secondary | ICD-10-CM | POA: Insufficient documentation

## 2019-07-09 DIAGNOSIS — Z046 Encounter for general psychiatric examination, requested by authority: Secondary | ICD-10-CM | POA: Diagnosis present

## 2019-07-09 LAB — RAPID URINE DRUG SCREEN, HOSP PERFORMED
Amphetamines: NOT DETECTED
Barbiturates: NOT DETECTED
Benzodiazepines: POSITIVE — AB
Cocaine: NOT DETECTED
Opiates: POSITIVE — AB
Tetrahydrocannabinol: POSITIVE — AB

## 2019-07-09 NOTE — Progress Notes (Signed)
Patient meets inpatient criteria per Nira Conn, NP. Patient has been faxed out to the following facilities for review:   [facility]  [facility]  [facility]  [facility]  [name] will continue to follow and assist with finding bed placement.   Drucilla Schmidt, MSW, LCSW-A Clinical Disposition Social Worker [facility]/TTS [phone]

## 2019-07-09 NOTE — Progress Notes (Addendum)
Pt accepted to [facility].     Dr. Dorathy Daft is the accepting and attending provider.   Call report to [phone].    Kristine @ [facility] ED notified.     Pt is Voluntary.    Pt may be transported by [employer] [phone]).   Pt scheduled to arrive anytime pending negative COVID.   Drucilla Schmidt, MSW, LCSW-A Clinical Disposition Social Worker [facility]/TTS [phone]

## 2019-07-09 NOTE — ED Notes (Signed)
Pt continues to refuse labs and Covid test. States that she had labs done at [facility] this week and will not given [place] anymore blood. Spoke with the [facility] Social Worker who got a psy bed at  [facility] in [place]. She is refusing to go at this time, states that she wanted to go to [facility]. PA Henderly, made aware the pt does not meet IVC requirements  and wants to leave AMA.  The pt will leave AMA at this time, mother is here to pick her up.

## 2019-07-09 NOTE — ED Triage Notes (Signed)
Per mother, states her daughter's psychiatric appointment was cancelled today-states daughter has been off meds for 2 months-states the  injection she was getting made her emotionless-states she needs meds changed-

## 2019-07-09 NOTE — ED Notes (Signed)
Pt alert x4, mother at bedside. Is refusing all labs and care, states that she wants to leave. Made Sam with TTS aware. Pt states she will stay to talk with TTS at this time. I will continue to monitor.

## 2019-07-09 NOTE — Progress Notes (Signed)
Per patient's RN, patient is refusing all labs, blood drawl, and tests. Patient is refusing to go to [facility]   [name] called and spoke with the patients mother, Theresa [name]. Patient's mother is hesitant to send her to [facility], she stated "I really don't want her to go, she is scared of [place], and I don't have the gas to go see her". She is planning on coming back to the hospital.   [name] called and spoke with the MD. [name] shared patient is refusing to go voluntarily. [name] inquired about IVC. Patient doesn't meet criteria per IVC. MD to discuss with patient again about going to [facility].   [name] called and informed patient's RN. [name] stated if patient is refusing treatment and MD clears her, she can sign herself out AMA.   Drucilla Schmidt, MSW, LCSW-A Clinical Disposition Social Worker [facility]/TTS [phone]

## 2019-07-09 NOTE — BH Assessment (Signed)
Tele Assessment Note   Patient Name: Theresa [name] MRN: [id number] Referring Physician: Nettie Elm PA-C Location of Patient: [place] Location of Provider: [place] is an 24 y.o. female who presents to the ED voluntarily accompanied by her mother. Pt endorses ongoing psychosis including hearing voices, tactile hallucinations of seeing and feeling maggots crawling on her skin, and decreased desire to tend to self-care. Mom is also present with the pt's consent and mom reports the pt lives with her and has been expressing delusions and brief periods of catatonic behavior where she will ask the pt a direct question and she does not respond. Pt states she was scheduled to see a provider at [facility] but they canceled the appointment. Pt states she does not feel safe due to the AVH. Pt reports she has not slept in several days, she does not believe her medication is effective, and mom states she feels the pt has been manic for the past 3 weeks based on observations at home. Mom states she has witnessed the pt walking around "like a stiff zombie." Pt presented to the ED on 07/01/19 and again on 07/06/19 but left prior to being seen. Mom reports pt continues to decline and she is concerned for the pt's ability to care for herself.   Lindon Romp, NP recommends inpt tx. ED staff advised of the disposition.   Diagnosis: Bipolar d/o, current episode manic; Opioid use d/o, severe; Cannabis use d/o, severe  Past Medical History:  Past Medical History:  Diagnosis Date  . Abnormal vaginal Pap smear   . Anxiety   . Bipolar disorder ([place])   . Depression   . Migraine without aura   . Polycystic ovary syndrome   . Psychosis ([place])   . Schizophrenia ([place])   . STD (sexually transmitted disease) 10-14-15   chlamydia     History reviewed. No pertinent surgical history.  Family History:  Family History  Problem Relation Age of Onset  . Hypertension  Mother   . Heart disease Mother   . Cancer Mother   . Hypertension Other   . Depression Father   . Drug abuse Father   . Bipolar disorder Father   . Heart disease Maternal Grandmother   . Pancreatitis Maternal Grandfather   . Cancer Paternal Grandmother   . Cancer Paternal Grandfather     Social History:  reports that she has been smoking cigarettes. She has been smoking about 0.50 packs per day. She has never used smokeless tobacco. She reports current alcohol use. She reports current drug use. Frequency: 2.00 times per week. Drugs: Marijuana, Methamphetamines, and Cocaine.  Additional Social History:  Alcohol / Drug Use Pain Medications: See MAR Prescriptions: See MAR Over the Counter: See MAR History of alcohol / drug use?: Yes Substance #1 Name of Substance 1: Heroin 1 - Age of First Use: 20s 1 - Amount (size/oz): excessive 1 - Frequency: 07/09/19 1 - Duration: daily 1 - Last Use / Amount: ongoing Substance #2 Name of Substance 2: Cannabis 2 - Age of First Use: teens 2 - Amount (size/oz): varies 2 - Frequency: occasional 2 - Duration: ongoing 2 - Last Use / Amount: unk  CIWA: CIWA-Ar BP: 124/77 Pulse Rate: 85 COWS:    Allergies:  Allergies  Allergen Reactions  . Seroquel [Quetiapine]     Home Medications: (Not in a hospital admission)   OB/GYN Status:  No LMP recorded. (Menstrual status: Irregular Periods).  General Assessment Data Assessment  unable to be completed: Yes Reason for not completing assessment: TTS calling cart, no answer Location of Assessment: [facility] ED TTS Assessment: In system Is this a Tele or Face-to-Face Assessment?: Tele Assessment Is this an Initial Assessment or a Re-assessment for this encounter?: Initial Assessment Patient Accompanied by:: Parent Language Other than English: No Living Arrangements: Other (Comment) What gender do you identify as?: Female Marital status: Single Pregnancy Status: No Living Arrangements: Parent Can  pt return to current living arrangement?: Yes Admission Status: Voluntary Is patient capable of signing voluntary admission?: Yes Referral Source: Self/Family/Friend Insurance type: [facility]     Crisis Care Plan Living Arrangements: Parent Name of Psychiatrist: [facility] of Therapist: [facility]  Education Status Is patient currently in school?: No Is the patient employed, unemployed or receiving disability?: Receiving disability income  Risk to self with the past 6 months Suicidal Ideation: No Has patient been a risk to self within the past 6 months prior to admission? : No Suicidal Intent: No Has patient had any suicidal intent within the past 6 months prior to admission? : No Is patient at risk for suicide?: No Suicidal Plan?: No Has patient had any suicidal plan within the past 6 months prior to admission? : No Access to Means: No What has been your use of drugs/alcohol within the last 12 months?: opiates, cannabis Previous Attempts/Gestures: No Triggers for Past Attempts: None known Intentional Self Injurious Behavior: None Family Suicide History: No Recent stressful life event(s): Conflict (Comment), Legal Issues, Other (Comment)(substance abuse, difficult relationship) Persecutory voices/beliefs?: No Depression: Yes Depression Symptoms: Tearfulness, Insomnia, Loss of interest in usual pleasures, Feeling worthless/self pity, Feeling angry/irritable Substance abuse history and/or treatment for substance abuse?: Yes Suicide prevention information given to non-admitted patients: Not applicable  Risk to Others within the past 6 months Homicidal Ideation: No Does patient have any lifetime risk of violence toward others beyond the six months prior to admission? : No Thoughts of Harm to Others: No Current Homicidal Intent: No Current Homicidal Plan: No Access to Homicidal Means: No History of harm to others?: No Assessment of Violence: None Noted Does patient have access to  weapons?: No Criminal Charges Pending?: Yes Describe Pending Criminal Charges: possession Does patient have a court date: Yes Court Date: (Apr 2021) Is patient on probation?: No  Psychosis Hallucinations: Auditory, Visual, Tactile Delusions: Unspecified  Mental Status Report Appearance/Hygiene: Bizarre Eye Contact: Fair Motor Activity: Unsteady Speech: Soft Level of Consciousness: Quiet/awake Mood: Labile, Anxious, Sad Affect: Anxious, Depressed, Sad, Flat Anxiety Level: Severe Thought Processes: Flight of Ideas Judgement: Impaired Orientation: Person, Place, Time, Situation Obsessive Compulsive Thoughts/Behaviors: Severe  Cognitive Functioning Concentration: Fair Memory: Remote Intact, Recent Intact Is patient IDD: No Insight: Poor Impulse Control: Poor Appetite: Poor Have you had any weight changes? : No Change Sleep: Decreased Total Hours of Sleep: 1 Vegetative Symptoms: None  ADLScreening [facility]) Patient's cognitive ability adequate to safely complete daily activities?: Yes Patient able to express need for assistance with ADLs?: Yes Independently performs ADLs?: Yes (appropriate for developmental age)  Prior Inpatient Therapy Prior Inpatient Therapy: Yes Prior Therapy Dates: 2020, 2019, and mult others Prior Therapy Facilty/Provider(s): [facility], [facility], [facility] Reason for Treatment: Schizoaffective Disorder, Bizarre Behaviors, Acute Psychosis  Prior Outpatient Therapy Prior Outpatient Therapy: Yes Prior Therapy Dates: ongoing Prior Therapy Facilty/Provider(s): [facility] Reason for Treatment: Schizophrenia, Bipolar, SA Does patient have an ACCT team?: No Does patient have Intensive In-House Services?  : No Does patient have [facility] services? : No  Does patient have P4CC services?: No  ADL Screening (condition at time of admission) Patient's cognitive ability adequate to safely complete daily activities?: Yes Is the patient  deaf or have difficulty hearing?: No Does the patient have difficulty seeing, even when wearing glasses/contacts?: No Does the patient have difficulty concentrating, remembering, or making decisions?: Yes Patient able to express need for assistance with ADLs?: Yes Does the patient have difficulty dressing or bathing?: No Independently performs ADLs?: Yes (appropriate for developmental age) Does the patient have difficulty walking or climbing stairs?: No Weakness of Legs: None Weakness of Arms/Hands: None  Home Assistive Devices/Equipment Home Assistive Devices/Equipment: None    Abuse/Neglect Assessment (Assessment to be complete while patient is alone) Abuse/Neglect Assessment Can Be Completed: Yes Physical Abuse: Denies Verbal Abuse: Denies Sexual Abuse: Denies Exploitation of patient/patient's resources: Denies Self-Neglect: Denies     Merchant navy officer (For Healthcare) Does Patient Have a Medical Advance Directive?: No Would patient like information on creating a medical advance directive?: No - Patient declined Nutrition Screen- [facility] Patient's home diet: Regular        Disposition:  Nira Conn, NP recommends inpt tx. ED staff advised of the disposition.   Disposition Initial Assessment Completed for this Encounter: Yes Disposition of Patient: Admit Type of inpatient treatment program: Adult Patient refused recommended treatment: No  This service was provided via telemedicine using a 2-way, interactive audio and video technology.  Names of all persons participating in this telemedicine service and their role in this encounter. Name:  Theresa [name] Role: Patient  Name: Theresa [name] Role: Mother  Name: Princess Bruins Role: TTS       Karolee Ohs 07/09/2019 9:33 PM

## 2019-07-09 NOTE — Discharge Instructions (Signed)
Return for new or worsening symptoms

## 2019-07-09 NOTE — Progress Notes (Signed)
07/09/2019  1850  Patient refused to have labs drawn. Patient states she had labs drawn this week at [facility]. Notified provider of patient request.

## 2019-07-09 NOTE — ED Provider Notes (Signed)
[facility] [place] [facility]EMERGENCY DEPT Provider Note   CSN: [id number] Arrival date & time: 07/09/19  1812     History Auditory hallucinations   Glennice Marcos Armenteros is a 24 y.o. female with past medical history significant for schizophrenia, bipolar, polysubstance abuse who presents for evaluation of auditory hallucinations.  Patient states she has continued to have auditory hallucinations.  She denies any command hallucinations.  She denies SI, HI, tactile hallucinations.  Patient was seen by psychiatry 3 days ago.  Apparently according to mother they had follow-up appointment at [facility] for to this morning which was canceled.  Patient has been off her Invega according to patient's mother due to it not working.  Admits to heroin use however will not tell me the last time she used.  3 days ago she was offered voluntary inpatient management however patient declined.  She was psych cleared at that time.  They were encouraged to follow-up outpatient with resources.  Patient and mother are here for possible medication management.    HPI     Past Medical History:  Diagnosis Date  . Abnormal vaginal Pap smear   . Anxiety   . Bipolar disorder ([facility])   . Depression   . Migraine without aura   . Polycystic ovary syndrome   . Psychosis ([facility])   . Schizophrenia ([facility])   . STD (sexually transmitted disease) 10-14-15   chlamydia     Patient Active Problem List   Diagnosis Date Noted  . Psychotic disorder ([facility]) 03/16/2019  . 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 Schizoaffective disorder, bipolar type ([facility]) 10/17/2017  . UTI (urinary tract infection) 09/28/2017  . 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 Bipolar affective disorder (H[facility]) 09/20/2017  . Bipolar affective disorder, mixed, severe, with psychotic behavior (HCC) 10/04/2016  . PCOS (polycystic ovarian syndrome) 01/18/2016  . Cannabis use disorder, moderate, dependence ([facility]) 12/06/2015  . Tobacco use disorder 12/05/2015     History reviewed. No pertinent surgical history.   OB History    Gravida  0   Para  0   Term  0   Preterm  0   AB  0   Living  0     SAB  0   TAB  0   Ectopic  0   Multiple  0   Live Births              Family History  Problem Relation Age of Onset  . Hypertension Mother   . Heart disease Mother   . Cancer Mother   . Hypertension Other   . Depression Father   . Drug abuse Father   . Bipolar disorder Father   . Heart disease Maternal Grandmother   . Pancreatitis Maternal Grandfather   . Cancer Paternal Grandmother   . Cancer Paternal Grandfather     Social History   Tobacco Use  . Smoking status: Current Every Day Smoker    Packs/day: 0.50    Types: Cigarettes  . Smokeless tobacco: Never Used  Substance Use Topics  . Alcohol use: Yes    Alcohol/week: 0.0 standard drinks  . Drug use: Yes    Frequency: 2.0 times per week    Types: Marijuana, Methamphetamines, Cocaine    Comment: meth, heroin, crack    Home Medications Prior to Admission medications   Medication Sig Start Date End Date Taking? Authorizing Provider  benztropine (COGENTIN) 1 MG tablet Take 1 tablet (1 mg total) by mouth 2 (two) times daily. Patient not taking: Reported  on 07/01/2019 03/19/19   Clapacs, Madie Reno, MD  paliperidone (INVEGA SUSTENNA) 234 MG/1.5ML SUSY injection Inject 234 mg into the muscle every 28 (twenty-eight) days. Next injection on 06/29/2018 Patient not taking: Reported on 07/01/2019 04/08/19   Clapacs, Madie Reno, MD  paliperidone (INVEGA) 6 MG 24 hr tablet Take 1 tablet (6 mg total) by mouth daily with supper. Patient not taking: Reported on 07/01/2019 03/19/19   Clapacs, Madie Reno, MD  traZODone (DESYREL) 100 MG tablet Take 1 tablet (100 mg total) by mouth at bedtime. Patient not taking: Reported on 07/01/2019 03/19/19   Clapacs, Madie Reno, MD    Allergies    Seroquel [quetiapine]  Review of Systems   Review of Systems  Constitutional: Negative.   HENT: Negative.    Respiratory: Negative.   Cardiovascular: Negative.   Gastrointestinal: Negative.   Genitourinary: Negative.   Musculoskeletal: Negative.   Skin: Negative.   Neurological: Negative.   Psychiatric/Behavioral: Positive for decreased concentration and hallucinations. Negative for suicidal ideas.  All other systems reviewed and are negative.   Physical Exam Updated Vital Signs BP 124/77 (BP Location: Left Arm)   Pulse 85   Temp 97.6 F (36.4 C) (Oral)   Resp 18   SpO2 98%   Physical Exam Vitals and nursing note reviewed.  Constitutional:      General: She is not in acute distress.    Appearance: She is well-developed. She is not ill-appearing, toxic-appearing or diaphoretic.  HENT:     Head: Normocephalic and atraumatic.     Nose: Nose normal.     Mouth/Throat:     Mouth: Mucous membranes are moist.     Pharynx: Oropharynx is clear.  Eyes:     Pupils: Pupils are equal, round, and reactive to light.  Cardiovascular:     Rate and Rhythm: Normal rate.  Pulmonary:     Effort: Pulmonary effort is normal. No respiratory distress.     Breath sounds: Normal breath sounds.  Abdominal:     General: Bowel sounds are normal. There is no distension.  Musculoskeletal:        General: Normal range of motion.     Cervical back: Normal range of motion.  Skin:    General: Skin is warm and dry.     Capillary Refill: Capillary refill takes less than 2 seconds.  Neurological:     General: No focal deficit present.     Mental Status: She is alert.  Psychiatric:        Attention and Perception: She perceives auditory hallucinations.        Mood and Affect: Affect is flat.        Thought Content: Thought content is not paranoid or delusional. Thought content does not include homicidal or suicidal ideation. Thought content does not include homicidal or suicidal plan.     Comments: Flat affect.  Admits to auditory hallucinations however denies command hallucinations, tactile hallucinations.      ED Results / Procedures / Treatments   Labs (all labs ordered are listed, but only abnormal results are displayed) Labs Reviewed  RAPID URINE DRUG SCREEN, HOSP PERFORMED - Abnormal; Notable for the following components:      Result Value   Opiates POSITIVE (*)    Benzodiazepines POSITIVE (*)    Tetrahydrocannabinol POSITIVE (*)    All other components within normal limits  RESPIRATORY PANEL BY RT PCR (FLU A&B, COVID)  ETHANOL  CBC WITH DIFFERENTIAL/PLATELET  SALICYLATE LEVEL  ACETAMINOPHEN LEVEL  BASIC METABOLIC  PANEL  I-STAT BETA HCG BLOOD, ED ([facility], [facility], [facility] ONLY)    EKG None  Radiology No results found.  Procedures Procedures (including critical care time)  Medications Ordered in ED Medications - No data to display  ED Course  I have reviewed the triage vital signs and the nursing notes.  Pertinent labs & imaging results that were available during my care of the patient were reviewed by me and considered in my medical decision making (see chart for details).  24 year old female history of schizophrenia presents for evaluation of auditory hallucinations.  Patient states she has consistent hallucinations.  Denies command hallucinations, SI, HI.  No tactile or visual hallucinations.  Has been off her Invega due to this not working.  Admits to polysubstance use. Mother does not want to IVC patient.  Patient refuses lab draw at this time however UDS positive for opiates, benzos, THC. Labs from 06/30/19 ED visit stable and at baseline. Will consult for TTS. Patient does not need inpatient criteria for IVC at this time.  Patient reassessed.  Continues to deny SI, HI.  Psychiatry with recommendations for voluntary inpatient admission.  Patient agreeable to voluntarily stay at this time.  She continues to refuse labs however psychiatry is okay with this.  They are requesting Covid testing.  Patient agreeable to this.  Patient will be transferred over to [facility] once bed available  and COVID negative for chronic auditory hallucinations.  Patient is here voluntarily.  She does not meet IVC criteria.  Patient seen evaluated by attending physician, Dr. Lajean Saver who agrees with above treatment, plan and disposition.  Patient has changed her mind and she no longer wants to receive inpatient treatment.  She continues to not meet IVC criteria.  Discussed with patient's mother.  Mother does not feel patient is a danger to herself or others and does not want to fill out IVC paperwork.  Discussed with patient psychiatry's recommendation for voluntary patient management for hallucinations however patient declines.  Patient appears to be at her baseline.  Continues to deny SI, HI, command hallucinations.  We discussed the nature and purpose, risks and benefits, as well as, the alternatives of treatment. Time was given to allow the opportunity to ask questions and consider their options, and after the discussion, the patient decided to refuse the offerred treatment. The patient was informed that refusal could lead to, but was not limited to, death, permanent disability, or severe pain. If present, I asked the relatives or significant others to dissuade them without success. Prior to refusing, I determined that the patient had the capacity to make their decision and understood the consequences of that decision. After refusal, I made every reasonable opportunity to treat them to the best of my ability.  The patient was notified that they may return to the emergency department at any time for further treatment.      MDM Rules/Calculators/A&P                       Final Clinical Impression(s) / ED Diagnoses Final diagnoses:  Auditory hallucination    Rx / DC Orders ED Discharge Orders    None       Adrin Julian A, PA-C 07/09/19 2232    Glynn Octave, MD 07/09/19 2244

## 2019-07-13 ENCOUNTER — Other Ambulatory Visit: Payer: Self-pay

## 2019-07-13 ENCOUNTER — Encounter (HOSPITAL_COMMUNITY): Payer: Self-pay | Admitting: Psychiatry

## 2019-07-13 ENCOUNTER — Inpatient Hospital Stay (HOSPITAL_COMMUNITY)
Admission: AD | Admit: 2019-07-13 | Discharge: 2019-07-16 | DRG: 885 | Disposition: A | Discharge status: Home / Self Care | Payer: Medicare Other | Source: Intra-hospital | Attending: Psychiatry | Admitting: Psychiatry

## 2019-07-13 DIAGNOSIS — Z20822 Contact with and (suspected) exposure to covid-19: Secondary | ICD-10-CM | POA: Diagnosis present

## 2019-07-13 DIAGNOSIS — F250 Schizoaffective disorder, bipolar type: Secondary | ICD-10-CM | POA: Diagnosis present

## 2019-07-13 DIAGNOSIS — G4700 Insomnia, unspecified: Secondary | ICD-10-CM | POA: Diagnosis present

## 2019-07-13 DIAGNOSIS — Z888 Allergy status to other drugs, medicaments and biological substances status: Secondary | ICD-10-CM

## 2019-07-13 DIAGNOSIS — Z79899 Other long term (current) drug therapy: Secondary | ICD-10-CM | POA: Diagnosis not present

## 2019-07-13 DIAGNOSIS — F1410 Cocaine abuse, uncomplicated: Secondary | ICD-10-CM | POA: Diagnosis present

## 2019-07-13 DIAGNOSIS — F1210 Cannabis abuse, uncomplicated: Secondary | ICD-10-CM | POA: Diagnosis present

## 2019-07-13 DIAGNOSIS — F1110 Opioid abuse, uncomplicated: Secondary | ICD-10-CM | POA: Diagnosis present

## 2019-07-13 DIAGNOSIS — Z818 Family history of other mental and behavioral disorders: Secondary | ICD-10-CM | POA: Diagnosis not present

## 2019-07-13 DIAGNOSIS — F259 Schizoaffective disorder, unspecified: Secondary | ICD-10-CM | POA: Diagnosis present

## 2019-07-13 DIAGNOSIS — R41843 Psychomotor deficit: Secondary | ICD-10-CM | POA: Diagnosis present

## 2019-07-13 DIAGNOSIS — F419 Anxiety disorder, unspecified: Secondary | ICD-10-CM | POA: Diagnosis present

## 2019-07-13 DIAGNOSIS — F17210 Nicotine dependence, cigarettes, uncomplicated: Secondary | ICD-10-CM | POA: Diagnosis present

## 2019-07-13 LAB — RESPIRATORY PANEL BY RT PCR (FLU A&B, COVID)
Influenza A by PCR: NEGATIVE
Influenza B by PCR: NEGATIVE
SARS Coronavirus 2 by RT PCR: NEGATIVE

## 2019-07-13 MED ORDER — HYDROXYZINE HCL 25 MG PO TABS
25.0000 mg | ORAL_TABLET | Freq: Three times a day (TID) | ORAL | Status: DC | PRN
  Administered 2019-07-13 (×2): 25 mg via ORAL
  Filled 2019-07-13 (×4): qty 1

## 2019-07-13 MED ORDER — TRAZODONE HCL 50 MG PO TABS
50.0000 mg | ORAL_TABLET | Freq: Every evening | ORAL | Status: DC | PRN
  Filled 2019-07-13 (×3): qty 1
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
## 2019-07-13 NOTE — BH Assessment (Signed)
Per [place], patient meets criteria for inpatient treatment. [facility] has no appropriate beds on the unit. Patient will therefore be placed on the Observation Unit and will need to be faxed out to facilities for placement. Patient's mom has requested for patient not be referred to [facility]. Per her mother, Reta's last hospitalization at the facility was a bad experience for them both.

## 2019-07-13 NOTE — Progress Notes (Signed)
Pt accepted to [facility]; 401-1.      Shuvon Rankin, NP is the accepting provider.    Dr. Jama Flavors is the attending provider.    Call report to [phone].   Pt is Voluntary.    Patient may admit to the unit once she has received a negative COVID test.   Drucilla Schmidt, MSW, LCSW-A Clinical Disposition Social Worker [facility]/TTS [phone]

## 2019-07-13 NOTE — Progress Notes (Signed)
   07/13/19 2147  Psych Admission Type (Psych Patients Only)  Admission Status Voluntary  Psychosocial Assessment  Patient Complaints Anxiety  Eye Contact Suspiciousness;Staring  Facial Expression Masked  Affect Blunted  Speech Soft;Slow  Interaction Cautious;Guarded  Motor Activity Fidgety;Pacing;Restless  Appearance/Hygiene Disheveled;In hospital gown  Behavior Characteristics Guarded  Mood Anxious;Labile  Thought Process  Coherency Concrete thinking;Circumstantial  Content Paranoia  Delusions None reported or observed  Perception Hallucinations  Hallucination Auditory  Judgment Impaired  Confusion Mild  Danger to Self  Current suicidal ideation? Denies  Danger to Others  Danger to Others None reported or observed  D: Patient calm and cooperative. Pt appears to be responding to internal stimuli. Pt denies VH but AH stating she hears "sprits in her room".  A: Medications administered as prescribed. Support and encouragement provided as needed.  R: Patient remains safe on the unit. Will continue to monitor for safety and stability.

## 2019-07-13 NOTE — Progress Notes (Signed)
Pt presents with her mother with complaints of increased paranoia and decreased overall functioning for the past three weeks. Pt answers few questions, with her mother Gavin Pound) answering most. Per mom, pt lives with mother and has been previously diagnosed with Bipolar 1 along with "catatonic schizophrenia". Pt appears fearful of objects in the room. She also states she is afraid of "the voices" which she describes as one man and one woman. Pt states she is afraid of them since "they have no body". Pt is compliant with vitals and admission interview. She was reluctant to COVID swab, stating she had one previously. After discussion with RN, pt was compliant with COVID swab. She was compliant with skin search. No abrasions/bruises/lacerations noted. Three tattoos noted, two on back and one on right ankle. Pt denies currently being on any medications. She reports previously being on Invega, however states this made her feel "numb".
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
## 2019-07-13 NOTE — H&P (Addendum)
[facility] Provider Admission PAA/H&P  Patient Identification: Theresa [name] MRN:  [id number] Date of Evaluation:  07/13/2019 Chief Complaint:  pending Principal Diagnosis: Schizoaffective disorder, bipolar type ([place]) Diagnosis:  Principal Problem:   Schizoaffective disorder, bipolar type ([place])  History of Present Illness: [name] is an 24 y.o. female.who presented to [facility] as a walk-in, voluntarily. She is accompanied with her mother. During the evaluation, she appears very paranoid. She endorsed feeling paranoid. She denies hallucinations, suicidal ideations or homicidal ideations. When asked if she had experienced any type of abuse she replied," spiritual abuse like something is all on my body." She was psychiatrically evaluated at [facility] ED 07/09/2019 with similar presentation and at that time, inpatient psychiatric hospitalization was recommended although mother and patient was not interested in admission, she did not meet IVC criteria, and she was discharged home. 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 She has a PMH of Schizoaffective disorder, bipolar type.   Patient mother, [name] who is present during the assessment adds that patient has been experiencing psychosis for the past 3 weeks which is worsening. She describes psychosis as," she (patient) is hallucinating. She is hearing voices, saying that things are touching her and .she hollers at them. She is very paranoid. Yesterday she claimed that a maggot was crawling on her when there wasn't. She does fine when its me giving her food but if it is someone else, the food will have to be wrapped up because she is so paranoid. I have to remind her to use the bathroom." As per mother, patient was on Invega long acting injectable although she reports she has not had the medication since November of last year along with any other psychotropic medications. She states," the  medication made her a different person, mean, and I do not think kit was helpful." Reports that patient had multiple psychiatric admissions in the past and per chart review, patient was admitted to [facility] for three days 02/2019. Reports patient has tried multiple psychotropic  Medication in the past that include; Thorazine, Lamictal, Seroquel (csased swelling), Ativan and Cogentin. Reports they were in the process of having patients outpatient psychiatric services switched over from [facility] to [facility] although patients medicaid will not be switched over until 07/26/2019. Reports a significant family history of mental health illness to include Schizophrenia. 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 Associated Signs/Symptoms: Depression Symptoms:  depressed mood, psychomotor retardation, disturbed sleep, (Hypo) Manic Symptoms:  Distractibility, Anxiety Symptoms:  Excessive Worry, Psychotic Symptoms:  Hallucinations: Auditory Tactile Paranoia, PTSD Symptoms: negative Total Time spent with patient: 30 minutes  Past Psychiatric History: Per chart review: Patient has a history of schizoaffective disorder has had several prior hospitalizations.  This seems to be an escalation in her substance abuse.  Previously had been using marijuana but I do not think anything stronger.  [facility] and was stable for quite a while on her long-acting Invega injection.  Not sure if current decompensation is a change in the illness or just caused by the substance abuse.  No past suicide attempts.  Is the patient at risk to self? Yes.    Has the patient been a risk to self in the past 6 months? Yes.    Has the patient been a risk to self within the distant  past? Yes.    Is the patient a risk to others? No.  Has the patient been a risk to others in the past 6 months? No.  Has the patient been a risk to others  within the distant past? No.   Prior Inpatient Therapy: Prior Inpatient Therapy: Yes Prior Therapy Dates: 2020, 2019, and mult others Prior Therapy Facilty/Provider(s): [place], [place], [facility] Reason for Treatment: Schizoaffective Disorder, Bizarre Behaviors, Acute Psychosis Prior Outpatient Therapy: Prior Outpatient Therapy: Yes Prior Therapy Dates: ongoing Prior Therapy Facilty/Provider(s): [place] Reason for Treatment: Schizophrenia, Bipolar, SA Does patient have an ACCT team?: No Does patient have Intensive In-House Services?  : No Does patient have [facility] services? : No Does patient have P4CC services?: No  Alcohol Screening: Patient refused Alcohol Screening Tool: Yes 1. How often do you have a drink containing alcohol?: Never Alcohol Brief Interventions/Follow-up: AUDIT Score <7 follow-up not indicated Substance Abuse History in the last 12 months:  Yes.   Consequences of Substance Abuse: worsening of psychiatric disorder Previous Psychotropic Medications: Yes  Psychological Evaluations: Yes  Past Medical History:  Past Medical History:  Diagnosis Date  . Abnormal vaginal Pap smear   . Anxiety   . Bipolar disorder ([place])   . Depression   . Migraine without aura   . Polycystic ovary syndrome   . Psychosis ([place])   . Schizophrenia ([place])   . STD (sexually transmitted disease) 10-14-15   chlamydia    History reviewed. No pertinent surgical history. Family History:  Family History  Problem Relation Age of Onset  . Hypertension Mother   . Heart disease Mother   . Cancer Mother   . Hypertension Other   . Depression Father   . Drug abuse Father   . Bipolar disorder Father   . Heart disease Maternal Grandmother   . Pancreatitis Maternal Grandfather   . Cancer Paternal Grandmother   . Cancer Paternal Grandfather    Family Psychiatric History: Schizophrenia as per mother  Tobacco Screening:   Social History:  Social History   Substance and Sexual  Activity  Alcohol Use Yes  . Alcohol/week: 0.0 standard drinks     Social History   Substance and Sexual Activity  Drug Use Yes  . Frequency: 2.0 times per week  . Types: Marijuana, Methamphetamines, Cocaine   Comment: meth, heroin, crack    Additional Social History: Marital status: Single    Pain Medications: SEE MAR Prescriptions: SEE MAR Over the Counter: n/a History of alcohol / drug use?: Yes Longest period of sobriety (when/how long): unk Negative Consequences of Use: Personal relationships Name of Substance 1: Heroin 1 - Age of First Use: patient unable to remember 1 - Amount (size/oz): patient unable to remember 1 - Frequency: patient unable to remember 1 - Duration: on0-going 1 - Last Use / Amount: 1 week ago Name of Substance 2: Patient has a history of THC use. However, during todays assessment she reported no history. 2 - Age of First Use: unk 2 - Amount (size/oz): unk 2 - Frequency: unk 2 - Duration: unk 2 - Last Use / Amount: unk Name of Substance 3: Patient has a history of cocaine use . However, during todays assessment she reported no history. 3 - Age of First Use: unk 3 - Amount (size/oz): unk 3 - Frequency: unk 3 - Duration: unk 3 - Last Use / Amount: unk    Allergies:   Allergies  Allergen Reactions  . Lamictal [Lamotrigine]   . Seroquel [  Quetiapine]    Lab Results: No results found for this or any previous visit (from the past 52 hour(s)).  Blood Alcohol level:  Lab Results  Component Value Date   ETH <10 06/30/2019   ETH <10 81/19/1478    Metabolic Disorder Labs:  Lab Results  Component Value Date   HGBA1C 5.2 07/01/2018   MPG 102.54 07/01/2018   MPG 99.67 10/19/2017   Lab Results  Component Value Date   PROLACTIN 73.5 (H) 10/19/2017   PROLACTIN 4.0 10/14/2015   Lab Results  Component Value Date   CHOL 147 07/01/2018   TRIG 54 07/01/2018   HDL 45 07/01/2018   CHOLHDL 3.3 07/01/2018   VLDL 11 07/01/2018   LDLCALC 91  07/01/2018   LDLCALC 86 10/19/2017    Current Medications: Current Facility-Administered Medications  Medication Dose Route Frequency Provider Last Rate Last Admin  . hydrOXYzine (ATARAX/VISTARIL) tablet 25 mg  25 mg Oral TID PRN Mordecai Maes, NP   25 mg at 07/13/19 1403   PTA Medications: Medications Prior to Admission  Medication Sig Dispense Refill Last Dose  . benztropine (COGENTIN) 1 MG tablet Take 1 tablet (1 mg total) by mouth 2 (two) times daily. (Patient not taking: Reported on 07/01/2019) 60 tablet 1 More than a month at Unknown time  . paliperidone (INVEGA SUSTENNA) 234 MG/1.5ML SUSY injection Inject 234 mg into the muscle every 28 (twenty-eight) days. Next injection on 06/29/2018 (Patient not taking: Reported on 07/01/2019) 1.8 mL 1 More than a month at Unknown time  . paliperidone (INVEGA) 6 MG 24 hr tablet Take 1 tablet (6 mg total) by mouth daily with supper. (Patient not taking: Reported on 07/01/2019) 30 tablet 1 More than a month at Unknown time  . traZODone (DESYREL) 100 MG tablet Take 1 tablet (100 mg total) by mouth at bedtime. (Patient not taking: Reported on 07/01/2019) 30 tablet 1 More than a month at Unknown time    Musculoskeletal: Strength & Muscle Tone: within normal limits Gait & Station: normal Patient leans: N/A  Psychiatric Specialty Exam: Physical Exam  Constitutional: She is oriented to person, place, and time.  Neurological: She is alert and oriented to person, place, and time.    Review of Systems  Psychiatric/Behavioral: Positive for hallucinations.       Paranoia     There were no vitals taken for this visit.There is no height or weight on file to calculate BMI.  General Appearance: Fairly Groomed  Eye Contact:  Good  Speech:  delayed  Volume:  Decreased  Mood:  Anxious  Affect:  Constricted  Thought Process:  Coherent and Descriptions of Associations: Intact  Orientation:  Full (Time, Place, and Person)  Thought Content:  Hallucinations:  Auditory Tactile and Paranoid Ideation  Suicidal Thoughts:  No  Homicidal Thoughts:  No  Memory:  Immediate;   Fair Recent;   Fair  Judgement:  Impaired  Insight:  Shallow  Psychomotor Activity:  Decreased  Concentration:  Concentration: Fair and Attention Span: Fair  Recall:  [employer] of Knowledge:  Fair  Language:  Fair  Akathisia:  Negative  Handed:  Right  AIMS (if indicated):     Assets:  Communication Skills Desire for Improvement Resilience Social Support  ADL's:  Intact  Cognition:  WNL  Sleep:         Treatment Plan Summary: Daily contact with patient to assess and evaluate symptoms and progress in treatment  Observation Level/Precautions:  15 minute checks Laboratory:  CBC Chemistry  Profile HbAIC HCG UDS UA, lipid panel, COVID, TSH    Other: Patient presents with psychosis. Based off of this evaluation along with collateral, inpatient psychiatric hospitalization is recommended. There are no appropriate  beds available here at [facility]. Patient will be admitted to the observation unit. She has been referred out by [name].    Mordecai Maes, NP 2/16/20212:23 PM

## 2019-07-14 LAB — ETHANOL: Alcohol, Ethyl (B): <10 mg/dL (ref <10)

## 2019-07-14 LAB — COMPREHENSIVE METABOLIC PANEL
ALT: 14 U/L (ref 0–44)
AST: 13 U/L — ABNORMAL LOW (ref 15–41)
Albumin: 4.5 g/dL (ref 3.5–5.0)
Alkaline Phosphatase: 132 U/L — ABNORMAL HIGH (ref 38–126)
Anion gap: 10 (ref 5–15)
BUN: 11 mg/dL (ref 6–20)
CO2: 24 mmol/L (ref 22–32)
Calcium: 9.2 mg/dL (ref 8.9–10.3)
Chloride: 104 mmol/L (ref 98–111)
Creatinine, Ser: 0.78 mg/dL (ref 0.44–1.00)
GFR calc Af Amer: >60 mL/min (ref >60)
GFR calc non Af Amer: >60 mL/min (ref >60)
Glucose, Bld: 106 mg/dL — ABNORMAL HIGH (ref 70–99)
Potassium: 3.6 mmol/L (ref 3.5–5.1)
Sodium: 138 mmol/L (ref 135–145)
Total Bilirubin: 0.6 mg/dL (ref 0.3–1.2)
Total Protein: 7.8 g/dL (ref 6.5–8.1)

## 2019-07-14 LAB — RAPID URINE DRUG SCREEN, HOSP PERFORMED
Amphetamines: NOT DETECTED
Barbiturates: NOT DETECTED
Benzodiazepines: POSITIVE — AB
Cocaine: NOT DETECTED
Opiates: NOT DETECTED
Tetrahydrocannabinol: POSITIVE — AB

## 2019-07-14 LAB — CBC
HCT: 46.6 % — ABNORMAL HIGH (ref 36.0–46.0)
Hemoglobin: 15.5 g/dL — ABNORMAL HIGH (ref 12.0–15.0)
MCH: 30.5 pg (ref 26.0–34.0)
MCHC: 33.3 g/dL (ref 30.0–36.0)
MCV: 91.7 fL (ref 80.0–100.0)
Platelets: 372 10*3/uL (ref 150–400)
RBC: 5.08 MIL/uL (ref 3.87–5.11)
RDW: 12.7 % (ref 11.5–15.5)
WBC: 8.9 10*3/uL (ref 4.0–10.5)
nRBC: 0 % (ref 0.0–0.2)

## 2019-07-14 LAB — TSH: TSH: 1.005 u[IU]/mL (ref 0.350–4.500)

## 2019-07-14 LAB — LIPID PANEL
Cholesterol: 119 mg/dL (ref 0–200)
HDL: 44 mg/dL (ref >40)
LDL Cholesterol: 61 mg/dL (ref 0–99)
Total CHOL/HDL Ratio: 2.7 RATIO
Triglycerides: 68 mg/dL (ref <150)
VLDL: 14 mg/dL (ref 0–40)

## 2019-07-14 LAB — HEMOGLOBIN A1C
Hgb A1c MFr Bld: 5.7 % — ABNORMAL HIGH (ref 4.8–5.6)
Mean Plasma Glucose: 116.89 mg/dL

## 2019-07-14 LAB — PREGNANCY, URINE: Preg Test, Ur: NEGATIVE

## 2019-07-14 MED ORDER — BENZTROPINE MESYLATE 0.5 MG PO TABS
0.5000 mg | ORAL_TABLET | Freq: Two times a day (BID) | ORAL | Status: DC
  Administered 2019-07-14 – 2019-07-16 (×4): 0.5 mg via ORAL
  Filled 2019-07-14 (×9): qty 1

## 2019-07-14 MED ORDER — TEMAZEPAM 30 MG PO CAPS
30.0000 mg | ORAL_CAPSULE | Freq: Every day | ORAL | Status: DC
  Administered 2019-07-14 – 2019-07-15 (×2): 30 mg via ORAL
  Filled 2019-07-14 (×2): qty 1

## 2019-07-14 MED ORDER — CLONAZEPAM 0.5 MG PO TABS
0.5000 mg | ORAL_TABLET | Freq: Three times a day (TID) | ORAL | Status: AC
  Administered 2019-07-14 – 2019-07-16 (×5): 0.5 mg via ORAL
  Filled 2019-07-14 (×6): qty 1

## 2019-07-14 MED ORDER — OMEGA-3-ACID ETHYL ESTERS 1 G PO CAPS
1.0000 g | ORAL_CAPSULE | Freq: Two times a day (BID) | ORAL | Status: DC
  Administered 2019-07-14 – 2019-07-16 (×4): 1 g via ORAL
  Filled 2019-07-14 (×9): qty 1

## 2019-07-14 MED ORDER — RISPERIDONE 2 MG PO TABS
2.0000 mg | ORAL_TABLET | Freq: Two times a day (BID) | ORAL | Status: DC
  Administered 2019-07-14 – 2019-07-15 (×2): 2 mg via ORAL
  Filled 2019-07-14 (×5): qty 1

## 2019-07-14 NOTE — Progress Notes (Signed)
[facility] LCSW Group Therapy Note  Date/Time: 2/17 @ 1:30pm   Type of Therapy/Topic:  Group Therapy:  Feelings about Diagnosis  Participation Level:  Did Not Attend   Mood: Pt did not attend    Description of Group:    This group will allow patients to explore their thoughts and feelings about diagnoses they have received. Patients will be guided to explore their level of understanding and acceptance of these diagnoses. Facilitator will encourage patients to process their thoughts and feelings about the reactions of others to their diagnosis, and will guide patients in identifying ways to discuss their diagnosis with significant others in their lives. This group will be process-oriented, with patients participating in exploration of their own experiences as well as giving and receiving support and challenge from other group members.   Therapeutic Goals: 1. Patient will demonstrate understanding of diagnosis as evidence by identifying two or more symptoms of the disorder:  2. Patient will be able to express two feelings regarding the diagnosis 3. Patient will demonstrate ability to communicate their needs through discussion and/or role plays  Summary of Patient Progress:  Pt did not attend   Therapeutic Modalities:   Cognitive Behavioral Therapy Brief Therapy Feelings Identification   Earlyne Iba, MSW intern

## 2019-07-14 NOTE — Progress Notes (Signed)
Patient did not attend wrap up group. 

## 2019-07-14 NOTE — Progress Notes (Signed)
Patient ID: Theresa [name], female   DOB: 1996/04/21, 24 y.o.   MRN: [id number]  D: Pt alert and oriented during [facility] admission process. Pt was adamant about being discharged and wanting to speak to the MD. Pt was escorted from the Observation Unit  to [address] without incident.  A: Education, support, reassurance, and encouragement provided, q15 minute safety checks initiated..    R: Pt denies any concerns at this time, and verbally contracts for safety. Pt ambulating on the unit with no issues. Pt remains safe on the unit.

## 2019-07-14 NOTE — Tx Team (Signed)
Initial Treatment Plan 07/14/2019 3:03 PM [name] WLT:[id number]    PATIENT STRESSORS: Medication change or noncompliance Substance abuse   PATIENT STRENGTHS: Supportive family/friends   PATIENT IDENTIFIED PROBLEMS: Psyshosis  Substance abuse  Anxiety                 DISCHARGE CRITERIA:  Improved stabilization in mood, thinking, and/or behavior Verbal commitment to aftercare and medication compliance Withdrawal symptoms are absent or subacute and managed without 24-hour nursing intervention  PRELIMINARY DISCHARGE PLAN: Outpatient therapy Return to previous living arrangement Return to previous work or school arrangements  PATIENT/FAMILY INVOLVEMENT: This treatment plan has been presented to and reviewed with the patient, [name], and/or family member.  The patient and family have been given the opportunity to ask questions and make suggestions.  Tania Ade, RN 07/14/2019, 3:03 PM
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
## 2019-07-14 NOTE — Progress Notes (Signed)
Patient ID: Theresa [name], female   DOB: 10-20-1995, 24 y.o.   MRN: [id number]   [place] NOVEL CORONAVIRUS (COVID-19) DAILY CHECK-OFF SYMPTOMS - answer yes or no to each - every day NO YES  Have you had a fever in the past 24 hours?  . Fever (Temp > 37.80C / 100F) X   Have you had any of these symptoms in the past 24 hours? . New Cough .  Sore Throat  .  Shortness of Breath .  Difficulty Breathing .  Unexplained Body Aches   X   Have you had any one of these symptoms in the past 24 hours not related to allergies?   . Runny Nose .  Nasal Congestion .  Sneezing   X   If you have had runny nose, nasal congestion, sneezing in the past 24 hours, has it worsened?  X   EXPOSURES - check yes or no X   Have you traveled outside the state in the past 14 days?  X   Have you been in contact with someone with a confirmed diagnosis of COVID-19 or PUI in the past 14 days without wearing appropriate PPE?  X   Have you been living in the same home as a person with confirmed diagnosis of COVID-19 or a PUI (household contact)?    X   Have you been diagnosed with COVID-19?    X              What to do next: Answered NO to all: Answered YES to anything:   Proceed with unit schedule Follow the [facility] Inpatient Flowsheet.

## 2019-07-14 NOTE — BH Assessment (Signed)
[facility] Assessment Progress Note  Per Nelly Rout, MD and Nehemiah Massed, MD, this voluntary pt requires psychiatric hospitalization at this time.  Jasmine has assigned pt to [facility] Rm [id number].  Pt's nurse, Ethelene Browns, has been notified.  Doylene Canning, [place] [facility] Coordinator [phone]

## 2019-07-14 NOTE — BHH Suicide Risk Assessment (Signed)
[facility] Admission Suicide Risk Assessment   Nursing information obtained from:  Patient, Family Demographic factors:  Low socioeconomic status, Adolescent or young adult, Caucasian Current Mental Status:  NA Loss Factors:  Decline in physical health Historical Factors:  NA Risk Reduction Factors:  Positive therapeutic relationship, Living with another person, especially a relative  Total Time spent with patient: 45 minutes Principal Problem: Schizoaffective disorder, bipolar type (HCC) Diagnosis:  Principal Problem:   Schizoaffective disorder, bipolar type (HCC) Active Problems:   Schizoaffective disorder (HCC)  Subjective Data: Admitted for stabilization due to psychosis  Continued Clinical Symptoms:    The "Alcohol Use Disorders Identification Test", Guidelines for Use in Primary Care, Second Edition.  World Science writer [facility]). Score between 0-7:  no or low risk or alcohol related problems. Score between 8-15:  moderate risk of alcohol related problems. Score between 16-19:  high risk of alcohol related problems. Score 20 or above:  warrants further diagnostic evaluation for alcohol dependence and treatment.   CLINICAL FACTORS:   Schizophrenia:   Less than 52 years old  Musculoskeletal: Strength & Muscle Tone: within normal limits Gait & Station: normal Patient leans: N/A  Psychiatric Specialty Exam: Physical Exam  Nursing note and vitals reviewed. Constitutional: She appears well-developed and well-nourished.  Cardiovascular: Normal rate and regular rhythm.    Review of Systems  Constitutional: Negative.   HENT: Negative.   Eyes: Negative.   Respiratory: Negative.   Cardiovascular: Negative.   Gastrointestinal: Negative.   Endocrine: Negative.   Genitourinary: Negative.   Musculoskeletal: Negative.   Allergic/Immunologic: Negative.   Neurological: Negative.   Hematological: Negative.     Blood pressure 129/81, pulse 69, temperature 98.1 F (36.7 C),  temperature source Oral, resp. rate 18.There is no height or weight on file to calculate BMI.  General Appearance: Casual  Eye Contact:  Fair  Speech:  Normal Rate  Volume:  Decreased  Mood:  aloof- disconnected  Affect:  Constricted  Thought Process:  Irrelevant and Descriptions of Associations: Circumstantial  Orientation:  Full (Time, Place, and Person)  Thought Content:  Hallucinations: Auditory and Paranoid Ideation  Suicidal Thoughts:  No  Homicidal Thoughts:  No  Memory:  Immediate;   Fair Recent;   Fair Remote;   Fair  Judgement:  Poor  Insight:  Shallow  Psychomotor Activity:  Normal  Concentration:  Concentration: Fair and Attention Span: Fair  Recall:  [employer] of Knowledge:  Fair  Language:  Good  Akathisia:  neg  Handed:  Right  AIMS (if indicated):     Assets:  Manufacturing systems engineer Housing Leisure Time Physical Health Resilience Social Support  ADL's:  Intact  Cognition:  WNL  Sleep:           COGNITIVE FEATURES THAT CONTRIBUTE TO RISK:  Loss of executive function    SUICIDE RISK:   Minimal: No identifiable suicidal ideation.  Patients presenting with no risk factors but with morbid ruminations; may be classified as minimal risk based on the severity of the depressive symptoms  PLAN OF CARE: see eval  I certify that inpatient services furnished can reasonably be expected to improve the patient's condition.   Malvin Johns, MD 07/14/2019, 9:44 AM

## 2019-07-15 DIAGNOSIS — F250 Schizoaffective disorder, bipolar type: Principal | ICD-10-CM

## 2019-07-15 LAB — PROLACTIN: Prolactin: 45.7 ng/mL — ABNORMAL HIGH (ref 4.8–23.3)

## 2019-07-15 MED ORDER — RISPERIDONE 2 MG PO TABS
4.0000 mg | ORAL_TABLET | Freq: Every day | ORAL | Status: DC
  Administered 2019-07-15: 4 mg via ORAL
  Filled 2019-07-15 (×3): qty 2

## 2019-07-15 MED ORDER — RISPERIDONE 2 MG PO TABS
2.0000 mg | ORAL_TABLET | Freq: Every day | ORAL | Status: DC
  Administered 2019-07-16: 2 mg via ORAL
  Filled 2019-07-15 (×3): qty 1

## 2019-07-15 NOTE — Progress Notes (Signed)
   07/15/19 1945  Psych Admission Type (Psych Patients Only)  Admission Status Voluntary  Psychosocial Assessment  Patient Complaints Anxiety  Eye Contact Fair  Facial Expression Flat  Affect Flat  Speech Soft  Interaction Cautious;Guarded  Motor Activity Slow  Appearance/Hygiene Unremarkable  Behavior Characteristics Cooperative;Anxious  Mood Preoccupied;Pleasant  Aggressive Behavior  Effect No apparent injury  Thought Process  Coherency Concrete thinking;Circumstantial  Content Preoccupation  Delusions None reported or observed  Perception WDL  Hallucination None reported or observed  Judgment Impaired  Confusion None  Danger to Self  Current suicidal ideation? Denies  Danger to Others  Danger to Others None reported or observed   Pt is preoccupied with her medications and what she wants to take. Pt asked about Lyrica for her pain. She's never taken it before and was told that was something to discuss with provider. Says her pain wakes her up at night; it is "like someone is jumping on my spine." And says she has pain in her legs below her knees. States she has had this back pain since stopping her [place] shot in November 2020. Denies AVH since November also. Pt does not want Invega shot again saying she felt "soulless, like a zombie." Also doesn't like Abilify either. Pt likes Risperdal and feels her mood is even. Is okay with taking pills.

## 2019-07-15 NOTE — Progress Notes (Signed)
Recreation Therapy Notes  INPATIENT RECREATION THERAPY ASSESSMENT  Patient Details Name: Stepahnie Campo MRN: [id number] DOB: 05/04/1996 Today's Date: 07/15/2019       Information Obtained From: Patient  Able to Participate in Assessment/Interview: Yes  Patient Presentation: (Delayed responses or no response at all)  Reason for Admission (Per Patient): Other (Comments)(Pt stated she wasn't feeling safe)  Patient Stressors: Relationship  Coping Skills:   Isolation, Music, Exercise, Deep Breathing, Meditate, Art, Talk, Prayer, Avoidance, Read  Leisure Interests (2+):  (Pt did not answer)  Frequency of Recreation/Participation:    Awareness of Community Resources:  (Pt did not answer)  County of Residence:  [place]  Patient Main Form of Transportation: (Pt did not answer)  Patient Strengths:  Pt did not answer  Patient Identified Areas of Improvement:  "I don't know"  Patient Goal for Hospitalization:  "work on discharge"  Current SI (including self-harm):  No  Current HI:  No  Current AVH: No  Staff Intervention Plan: Group Attendance, Collaborate with Interdisciplinary Treatment Team  Consent to Intern Participation: N/A    Darryl Lent, Jakaden Ouzts A 07/15/2019, 11:27 AM

## 2019-07-15 NOTE — Progress Notes (Signed)
Patient learned today that she can get herself into trouble when she doesn't take her medication. Her goal for tomorrow is to speak to someone about her discharge plans and to start taking the right medication.

## 2019-07-15 NOTE — Progress Notes (Addendum)
[facility] MD Progress Note  07/15/2019 9:48 AM Theresa [name]  MRN:  [id number] Subjective:   This is the latest of multiple psychiatric admissions and healthcare system encounters for Ms. 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  She has been lost to follow-up for 9 months.  Her last hospitalization was at [facility] in October 2020.  At that point in time she was treated with Gean Birchwood, and oral Invega.  Patient reports she slept fair she denies current auditory hallucinations she does seem improved although guarded and less anxious overall.  Affect constricted.  Denies thoughts of harming self or others-  Spoke with mother to give her an update with her permission, she emphasized the patient was quite high functioning prior to her psychotic break, was affiliated with a young man who introduced her to heroin and that seem to trigger her psychosis, but the patient was working, in school, an active musician in order so forth before she became psychotic Principal Problem: Schizoaffective disorder, bipolar type (HCC) Diagnosis: Principal Problem:   Schizoaffective disorder, bipolar type (HCC) Active Problems:   Schizoaffective disorder (HCC)  Total Time spent with patient: 20 minutes  Past Psychiatric History: As above  Past Medical History:  Past Medical History:  Diagnosis Date  . Abnormal vaginal Pap smear   . Anxiety   . Bipolar disorder (HCC)   . Depression   . Migraine without aura   . Polycystic ovary syndrome   . Psychosis (HCC)   . Schizophrenia (HCC)   . STD (sexually transmitted disease) 10-14-15   chlamydia    History reviewed. No pertinent surgical history. Family History:  Family History  Problem Relation Age of Onset  . Hypertension Mother   . Heart disease Mother   . Cancer Mother   . Hypertension Other   . Depression  Father   . Drug abuse Father   . Bipolar disorder Father   . Heart disease Maternal Grandmother   . Pancreatitis Maternal Grandfather   . Cancer Paternal Grandmother   . Cancer Paternal Grandfather    Family Psychiatric  History: As above Social History:  Social History   Substance and Sexual Activity  Alcohol Use Yes  . Alcohol/week: 0.0 standard drinks     Social History   Substance and Sexual Activity  Drug Use Yes  . Frequency: 2.0 times per week  . Types: Marijuana, Methamphetamines, Cocaine   Comment: meth, heroin, crack    Social History   Socioeconomic History  . Marital status: Legally Separated    Spouse name: Not on file  . Number of children: Not on file  . Years of education: Not on file  . Highest education level: Not on file  Occupational History  . Not on file  Tobacco Use  . Smoking status: Current Every Day Smoker    Packs/day: 0.50    Types: Cigarettes  . Smokeless tobacco: Never Used  Substance and Sexual Activity  . Alcohol use: Yes    Alcohol/week: 0.0 standard drinks  . Drug use: Yes    Frequency: 2.0 times per week    Types: Marijuana, Methamphetamines, Cocaine    Comment: meth, heroin, crack  . Sexual activity: Yes    Partners: Male    Birth control/protection: None  Other Topics Concern  . Not on file  Social History Narrative  . Not on file  Social Determinants of Health   Financial Resource Strain:   . Difficulty of Paying Living Expenses: Not on file  Food Insecurity:   . Worried About Programme researcher, broadcasting/film/video in the Last Year: Not on file  . Ran Out of Food in the Last Year: Not on file  Transportation Needs:   . Lack of Transportation (Medical): Not on file  . Lack of Transportation (Non-Medical): Not on file  Physical Activity:   . Days of Exercise per Week: Not on file  . Minutes of Exercise per Session: Not on file  Stress:   . Feeling of Stress : Not on file  Social Connections:   . Frequency of Communication with  Friends and Family: Not on file  . Frequency of Social Gatherings with Friends and Family: Not on file  . Attends Religious Services: Not on file  . Active Member of Clubs or Organizations: Not on file  . Attends Banker Meetings: Not on file  . Marital Status: Not on file   Additional Social History:    Pain Medications: SEE MAR Prescriptions: SEE MAR Over the Counter: n/a History of alcohol / drug use?: Yes Longest period of sobriety (when/how long): unk Negative Consequences of Use: Personal relationships Name of Substance 1: Heroin 1 - Age of First Use: patient unable to remember 1 - Amount (size/oz): patient unable to remember 1 - Frequency: patient unable to remember 1 - Duration: on0-going 1 - Last Use / Amount: 1 week ago Name of Substance 2: Patient has a history of THC use. However, during todays assessment she reported no history. 2 - Age of First Use: unk 2 - Amount (size/oz): unk 2 - Frequency: unk 2 - Duration: unk 2 - Last Use / Amount: unk Name of Substance 3: Patient has a history of cocaine use . However, during todays assessment she reported no history. 3 - Age of First Use: unk 3 - Amount (size/oz): unk 3 - Frequency: unk 3 - Duration: unk 3 - Last Use / Amount: unk              Sleep: Good  Appetite:  Good  Current Medications: Current Facility-Administered Medications  Medication Dose Route Frequency Provider Last Rate Last Admin  . benztropine (COGENTIN) tablet 0.5 mg  0.5 mg Oral BID Malvin Johns, MD   0.5 mg at 07/15/19 0805  . clonazePAM (KLONOPIN) tablet 0.5 mg  0.5 mg Oral TID Malvin Johns, MD   0.5 mg at 07/15/19 0805  . hydrOXYzine (ATARAX/VISTARIL) tablet 25 mg  25 mg Oral TID PRN Denzil Magnuson, NP   25 mg at 07/13/19 2104  . omega-3 acid ethyl esters (LOVAZA) capsule 1 g  1 g Oral BID Malvin Johns, MD   1 g at 07/15/19 0805  . risperiDONE (RISPERDAL) tablet 2 mg  2 mg Oral BID Malvin Johns, MD   2 mg at 07/15/19 0805   . temazepam (RESTORIL) capsule 30 mg  30 mg Oral QHS Malvin Johns, MD   30 mg at 07/14/19 2105  . traZODone (DESYREL) tablet 50 mg  50 mg Oral QHS PRN Denzil Magnuson, NP        Lab Results:  Results for orders placed or performed during the hospital encounter of 07/13/19 (from the past 48 hour(s))  Respiratory Panel by RT PCR (Flu A&B, Covid) - Nasopharyngeal Swab     Status: None   Collection Time: 07/13/19  1:33 PM   Specimen: Nasopharyngeal Swab  Result Value  Ref Range   SARS Coronavirus 2 by RT PCR NEGATIVE NEGATIVE    Comment: (NOTE) SARS-CoV-2 target nucleic acids are NOT DETECTED. The SARS-CoV-2 RNA is generally detectable in upper respiratoy specimens during the acute phase of infection. The lowest concentration of SARS-CoV-2 viral copies this assay can detect is 131 copies/mL. A negative result does not preclude SARS-Cov-2 infection and should not be used as the sole basis for treatment or other patient management decisions. A negative result may occur with  improper specimen collection/handling, submission of specimen other than nasopharyngeal swab, presence of viral mutation(s) within the areas targeted by this assay, and inadequate number of viral copies (<131 copies/mL). A negative result must be combined with clinical observations, patient history, and epidemiological information. The expected result is Negative. Fact Sheet for Patients:  PinkCheek.be Fact Sheet for Healthcare Providers:  GravelBags.it This test is not yet ap proved or cleared by the [place] FDA and  has been authorized for detection and/or diagnosis of SARS-CoV-2 by FDA under an Emergency Use Authorization (EUA). This EUA will remain  in effect (meaning this test can be used) for the duration of the COVID-19 declaration under Section 564(b)(1) of the Act, 21 U.S.C. section 360bbb-3(b)(1), unless the authorization is terminated  or revoked sooner.    Influenza A by PCR NEGATIVE NEGATIVE   Influenza B by PCR NEGATIVE NEGATIVE    Comment: (NOTE) The Xpert Xpress SARS-CoV-2/FLU/RSV assay is intended as an aid in  the diagnosis of influenza from Nasopharyngeal swab specimens and  should not be used as a sole basis for treatment. Nasal washings and  aspirates are unacceptable for Xpert Xpress SARS-CoV-2/FLU/RSV  testing. Fact Sheet for Patients: PinkCheek.be Fact Sheet for Healthcare Providers: GravelBags.it This test is not yet approved or cleared by the [place] FDA and  has been authorized for detection and/or diagnosis of SARS-CoV-2 by  FDA under an Emergency Use Authorization (EUA). This EUA will remain  in effect (meaning this test can be used) for the duration of the  Covid-19 declaration under Section 564(b)(1) of the Act, 21  U.S.C. section 360bbb-3(b)(1), unless the authorization is  terminated or revoked. Performed at [facility], [place] [address], [place], [place] [address]   CBC     Status: Abnormal   Collection Time: 07/14/19  6:20 AM  Result Value Ref Range   WBC 8.9 4.0 - 10.5 K/uL   RBC 5.08 3.87 - 5.11 MIL/uL   Hemoglobin 15.5 (H) 12.0 - 15.0 g/dL   HCT 46.6 (H) 36.0 - 46.0 %   MCV 91.7 80.0 - 100.0 fL   MCH 30.5 26.0 - 34.0 pg   MCHC 33.3 30.0 - 36.0 g/dL   RDW 12.7 11.5 - 15.5 %   Platelets 372 150 - 400 K/uL   nRBC 0.0 0.0 - 0.2 %    Comment: Performed at [facility], [place] [address], [place], [place] [address]  Comprehensive metabolic panel     Status: Abnormal   Collection Time: 07/14/19  6:20 AM  Result Value Ref Range   Sodium 138 135 - 145 mmol/L   Potassium 3.6 3.5 - 5.1 mmol/L   Chloride 104 98 - 111 mmol/L   CO2 24 22 - 32 mmol/L   Glucose, Bld 106 (H) 70 - 99 mg/dL   BUN 11 6 - 20 mg/dL   Creatinine, Ser 0.78 0.44 - 1.00 mg/dL   Calcium 9.2 8.9 - 10.3 mg/dL   Total  Protein 7.8 6.5 -  8.1 g/dL   Albumin 4.5 3.5 - 5.0 g/dL   AST 13 (L) 15 - 41 U/L   ALT 14 0 - 44 U/L   Alkaline Phosphatase 132 (H) 38 - 126 U/L   Total Bilirubin 0.6 0.3 - 1.2 mg/dL   GFR calc non Af Amer >60 >60 mL/min   GFR calc Af Amer >60 >60 mL/min   Anion gap 10 5 - 15    Comment: Performed at [facility], [address], [place], [place] [address]  Hemoglobin A1c     Status: Abnormal   Collection Time: 07/14/19  6:20 AM  Result Value Ref Range   Hgb A1c MFr Bld 5.7 (H) 4.8 - 5.6 %    Comment: (NOTE) Pre diabetes:          5.7%-6.4% Diabetes:              >6.4% Glycemic control for   <7.0% adults with diabetes    Mean Plasma Glucose 116.89 mg/dL    Comment: Performed at [facility], [address], [place], [place] [address]  Ethanol     Status: None   Collection Time: 07/14/19  6:20 AM  Result Value Ref Range   Alcohol, Ethyl (B) <10 <10 mg/dL    Comment: (NOTE) Lowest detectable limit for serum alcohol is 10 mg/dL. For medical purposes only. Performed at [facility], [address] [place], [place] [address]   Lipid panel     Status: None   Collection Time: 07/14/19  6:20 AM  Result Value Ref Range   Cholesterol 119 0 - 200 mg/dL   Triglycerides 68 <213 mg/dL   HDL 44 >08 mg/dL   Total CHOL/HDL Ratio 2.7 RATIO   VLDL 14 0 - 40 mg/dL   LDL Cholesterol 61 0 - 99 mg/dL    Comment:        Total Cholesterol/HDL:CHD Risk Coronary Heart Disease Risk Table                     Men   Women  1/2 Average Risk   3.4   3.3  Average Risk       5.0   4.4  2 X Average Risk   9.6   7.1  3 X Average Risk  23.4   11.0        Use the calculated Patient Ratio above and the CHD Risk Table to determine the patient's CHD Risk.        ATP III CLASSIFICATION (LDL):  <100     mg/dL   Optimal  657-846  mg/dL   Near or Above                    Optimal  130-159  mg/dL   Borderline  962-952  mg/dL   High  >841     mg/dL   Very  High Performed at [facility], [address], [place], [place] [address]   TSH     Status: None   Collection Time: 07/14/19  6:20 AM  Result Value Ref Range   TSH 1.005 0.350 - 4.500 uIU/mL    Comment: Performed by a 3rd Generation assay with a functional sensitivity of <=0.01 uIU/mL. Performed at [facility], [address] [place], [place] [address]   Prolactin     Status: Abnormal   Collection Time: 07/14/19  6:20 AM  Result Value Ref Range  Prolactin 45.7 (H) 4.8 - 23.3 ng/mL    Comment: (NOTE) Performed At: [facility] [address] [place], [place] [id number] Jolene SchimkeNagendra Sanjai MD WU:[phone]   Urine rapid drug screen (hosp performed)not at [facility]     Status: Abnormal   Collection Time: 07/14/19  6:36 AM  Result Value Ref Range   Opiates NONE DETECTED NONE DETECTED   Cocaine NONE DETECTED NONE DETECTED   Benzodiazepines POSITIVE (A) NONE DETECTED   Amphetamines NONE DETECTED NONE DETECTED   Tetrahydrocannabinol POSITIVE (A) NONE DETECTED   Barbiturates NONE DETECTED NONE DETECTED    Comment: (NOTE) DRUG SCREEN FOR MEDICAL PURPOSES ONLY.  IF CONFIRMATION IS NEEDED FOR ANY PURPOSE, NOTIFY LAB WITHIN 5 DAYS. LOWEST DETECTABLE LIMITS FOR URINE DRUG SCREEN Drug Class                     Cutoff (ng/mL) Amphetamine and metabolites    1000 Barbiturate and metabolites    200 Benzodiazepine                 200 Tricyclics and metabolites     300 Opiates and metabolites        300 Cocaine and metabolites        300 THC                            50 Performed at [facility], [address] [place], [place] [address]   Pregnancy, urine     Status: None   Collection Time: 07/14/19  6:36 AM  Result Value Ref Range   Preg Test, Ur NEGATIVE NEGATIVE    Comment:        THE SENSITIVITY OF THIS METHODOLOGY IS >20 mIU/mL. Performed at [facility], [address] [place], [place] [address]      Blood Alcohol level:  Lab Results  Component Value Date   ETH <10 07/14/2019   ETH <10 06/30/2019    Metabolic Disorder Labs: Lab Results  Component Value Date   HGBA1C 5.7 (H) 07/14/2019   MPG 116.89 07/14/2019   MPG 102.54 07/01/2018   Lab Results  Component Value Date   PROLACTIN 45.7 (H) 07/14/2019   PROLACTIN 73.5 (H) 10/19/2017   Lab Results  Component Value Date   CHOL 119 07/14/2019   TRIG 68 07/14/2019   HDL 44 07/14/2019   CHOLHDL 2.7 07/14/2019   VLDL 14 07/14/2019   LDLCALC 61 07/14/2019   LDLCALC 91 07/01/2018    Physical Findings: AIMS: Facial and Oral Movements Muscles of Facial Expression: None, normal Lips and Perioral Area: None, normal Jaw: None, normal Tongue: None, normal,Extremity Movements Upper (arms, wrists, hands, fingers): None, normal Lower (legs, knees, ankles, toes): None, normal, Trunk Movements Neck, shoulders, hips: None, normal, Overall Severity Severity of abnormal movements (highest score from questions above): None, normal Incapacitation due to abnormal movements: None, normal Patient's awareness of abnormal movements (rate only patient's report): No Awareness, Dental Status Current problems with teeth and/or dentures?: No Does patient usually wear dentures?: No  CIWA:    COWS:     Musculoskeletal: Strength & Muscle Tone: within normal limits Gait & Station: normal Patient leans: N/A  Psychiatric Specialty Exam: Physical Exam  Review of Systems  Blood pressure 110/67, pulse 93, temperature 98.5 F (36.9 C), resp. rate 18, SpO2 96 %.There is no height or weight on file to calculate BMI.  General Appearance: Casual  Eye Contact:  Fair  Speech:  Clear and Coherent  Volume:  Decreased  Mood:  Anxious and Dysphoric  Affect:  Blunt  Thought Process:  Goal Directed  Orientation:  Full (Time, Place, and Person)  Thought Content:  Denies current auditory hallucinations  Suicidal Thoughts:  No  Homicidal Thoughts:  No   Memory:  Immediate;   Fair Recent;   Poor Remote;   Fair  Judgement:  Fair  Insight:  Fair  Psychomotor Activity:  EPS negative on exam  Concentration:  Concentration: Fair and Attention Span: Fair  Recall:  [employer] of Knowledge:  Fair  Language:  Fair  Akathisia:  Negative  Handed:  Right  AIMS (if indicated):     Assets:  Physical Health Resilience Social Support  ADL's:  Intact  Cognition:  WNL  Sleep:        Treatment Plan Summary: Daily contact with patient to assess and evaluate symptoms and progress in treatment and Medication management  Continue but escalate Risperdal continue reality based therapy no change in precautions discussed case with mother discussed long-acting injectable  Malvin Johns, MD 07/15/2019, 9:48 AM

## 2019-07-15 NOTE — Progress Notes (Signed)
   07/14/19 2303  Psych Admission Type (Psych Patients Only)  Admission Status Voluntary  Psychosocial Assessment  Patient Complaints Anxiety  Eye Contact Suspiciousness;Staring  Facial Expression Flat  Affect Flat  Speech Soft  Interaction Cautious;Guarded  Motor Activity Pacing;Restless  Appearance/Hygiene In hospital gown  Behavior Characteristics Cooperative  Mood Pleasant  Thought Process  Coherency Concrete thinking;Circumstantial  Content Paranoia  Delusions None reported or observed  Perception Hallucinations  Hallucination Auditory  Judgment Impaired  Confusion Mild  Danger to Self  Current suicidal ideation? Denies  Danger to Others  Danger to Others None reported or observed

## 2019-07-15 NOTE — Progress Notes (Signed)
DAR NOTE: Patient presents with flat affect and depressed mood.  Denies suicidal thoughts, pain, auditory and visual hallucinations.  Rates depression at 3, hopelessness at 0, and anxiety at 0.  Maintained on routine safety checks.  Medications given as prescribed.  Support and encouragement offered as needed.  Attended group and participated.  States goal for today is "staying focused and taking medicine."  Patient visible on the unit with minimal interaction. Patient is safe on and off the unit.  Offered no complaint.

## 2019-07-15 NOTE — Progress Notes (Signed)
Recreation Therapy Notes  Date: 2.18.21 Time: 1000 Location: 500 Hall Dayroom  Group Topic: Communication  Goal Area(s) Addresses:  Patient will effectively communicate with peers in group.  Patient will verbalize benefit of healthy communication. Patient will verbalize positive effect of healthy communication on post d/c goals.  Patient will identify communication techniques that made activity effective for group.   Intervention: Pencils, paper  Activity: Geometrical Drawings.  Patients took turns describing pictures to the group.  The person describing the picture could use as much detail as necessary to describe the picture.  The remaining group members had to draw the as it was described to them.  The group could not ask any clarifying questions.  The group could only ask the presenter to repeat themselves.  Education: Communication, Discharge Planning  Education Outcome: Acknowledges understanding/In group clarification offered/Needs additional education.   Clinical Observations/Feedback: Pt did not attend group session.     Aurea Aronov, LRT/CTRS         Kylo Gavin A 07/15/2019 11:20 AM 

## 2019-07-16 MED ORDER — RISPERIDONE 2 MG PO TABS: ORAL_TABLET | ORAL | 2 refills | Status: AC

## 2019-07-16 MED ORDER — CLONAZEPAM 0.5 MG PO TABS
0.5000 mg | ORAL_TABLET | Freq: Two times a day (BID) | ORAL | Status: DC
  Administered 2019-07-16: 0.5 mg via ORAL
  Filled 2019-07-16: qty 1

## 2019-07-16 MED ORDER — CLONAZEPAM 0.5 MG PO TABS: 0.5000 mg | ORAL_TABLET | Freq: Two times a day (BID) | ORAL | 0 refills | Status: AC

## 2019-07-16 MED ORDER — TRAZODONE HCL 100 MG PO TABS: 100.0000 mg | ORAL_TABLET | Freq: Every evening | ORAL | 1 refills | Status: AC | PRN

## 2019-07-16 MED ORDER — BENZTROPINE MESYLATE 1 MG PO TABS: 1.0000 mg | ORAL_TABLET | Freq: Two times a day (BID) | ORAL | 2 refills | Status: AC

## 2019-07-16 MED ORDER — OMEGA-3-ACID ETHYL ESTERS 1 G PO CAPS: 1.0000 g | ORAL_CAPSULE | Freq: Two times a day (BID) | ORAL | 2 refills | Status: AC

## 2019-07-16 NOTE — BHH Suicide Risk Assessment (Signed)
[facility] Discharge Suicide Risk Assessment   Principal Problem: Schizoaffective disorder, bipolar type [facility]) Discharge Diagnoses: Principal Problem:   Schizoaffective disorder, bipolar type (HCC) Active Problems:   Schizoaffective disorder (HCC)   Total Time spent with patient: 45 minutes Musculoskeletal: Strength & Muscle Tone: within normal limits Gait & Station: normal Patient leans: N/A  Psychiatric Specialty Exam: Physical Exam  Review of Systems  Blood pressure 116/68, pulse (!) 108, temperature 98 F (36.7 C), resp. rate 18, SpO2 96 %.There is no height or weight on file to calculate BMI.  General Appearance: Casual  Eye Contact:  Good  Speech:  Clear and Coherent  Volume:  Decreased  Mood:  Euthymic  Affect:  Flat  Thought Process:  Coherent and Goal Directed  Orientation:  Full (Time, Place, and Person)  Thought Content:  Logical  Suicidal Thoughts:  No  Homicidal Thoughts:  No  Memory:  Immediate;   Fair Recent;   Fair Remote;   Fair  Judgement:  Fair  Insight:  Fair  Psychomotor Activity:  Normal  Concentration:  Concentration: Fair and Attention Span: Fair  Recall:  [employer] of Knowledge:  Good  Language:  Poverty of content  Akathisia:  Negative  Handed:  Right  AIMS (if indicated):     Assets:  Manufacturing systems engineer Housing Leisure Time Physical Health Resilience Social Support  ADL's:  Intact  Cognition:  WNL  Sleep:  Number of Hours: 8.5     Mental Status Per Nursing Assessment::   On Admission:  NA  Demographic Factors:  Unemployed  Loss Factors: Decrease in vocational status  Historical Factors: NA  Risk Reduction Factors:   Sense of responsibility to family, Religious beliefs about death and Living with another person, especially a relative  Continued Clinical Symptoms:  Previous Psychiatric Diagnoses and Treatments  Cognitive Features That Contribute To Risk:  Loss of executive function    Suicide Risk:  Minimal: No  identifiable suicidal ideation.  Patients presenting with no risk factors but with morbid ruminations; may be classified as minimal risk based on the severity of the depressive symptoms    Plan Of Care/Follow-up recommendations:  Activity:  full  Kenishia Plack, MD 07/16/2019, 9:09 AM

## 2019-07-16 NOTE — Tx Team (Signed)
Interdisciplinary Treatment and Diagnostic Plan Update  07/16/2019 Time of Session: [place] MRN: [id number]  Principal Diagnosis: Schizoaffective disorder, bipolar type [facility])  Secondary Diagnoses: Principal Problem:   Schizoaffective disorder, bipolar type ([place]) Active Problems:   Schizoaffective disorder ([place])   Current Medications:  Current Facility-Administered Medications  Medication Dose Route Frequency Provider Last Rate Last Admin  . benztropine (COGENTIN) tablet 0.5 mg  0.5 mg Oral BID Johnn Hai, MD   0.5 mg at 07/16/19 0830  . clonazePAM (KLONOPIN) tablet 0.5 mg  0.5 mg Oral BID Johnn Hai, MD   0.5 mg at 07/16/19 1241  . hydrOXYzine (ATARAX/VISTARIL) tablet 25 mg  25 mg Oral TID PRN Mordecai Maes, NP   25 mg at 07/13/19 2104  . omega-3 acid ethyl esters (LOVAZA) capsule 1 g  1 g Oral BID Johnn Hai, MD   1 g at 07/16/19 0830  . risperiDONE (RISPERDAL) tablet 2 mg  2 mg Oral Daily Johnn Hai, MD   2 mg at 07/16/19 0830  . risperiDONE (RISPERDAL) tablet 4 mg  4 mg Oral QHS Johnn Hai, MD   4 mg at 07/15/19 2035  . temazepam (RESTORIL) capsule 30 mg  30 mg Oral QHS Johnn Hai, MD   30 mg at 07/15/19 2035  . traZODone (DESYREL) tablet 50 mg  50 mg Oral QHS PRN Mordecai Maes, NP       PTA Medications: Medications Prior to Admission  Medication Sig Dispense Refill Last Dose  . paliperidone (INVEGA SUSTENNA) 234 MG/1.5ML SUSY injection Inject 234 mg into the muscle every 28 (twenty-eight) days. Next injection on 06/29/2018 (Patient not taking: Reported on 07/01/2019) 1.8 mL 1 More than a month at Unknown time  . paliperidone (INVEGA) 6 MG 24 hr tablet Take 1 tablet (6 mg total) by mouth daily with supper. (Patient not taking: Reported on 07/01/2019) 30 tablet 1 More than a month at Unknown time  . [DISCONTINUED] benztropine (COGENTIN) 1 MG tablet Take 1 tablet (1 mg total) by mouth 2 (two) times daily. (Patient not taking: Reported on 07/01/2019) 60 tablet  1 More than a month at Unknown time  . [DISCONTINUED] traZODone (DESYREL) 100 MG tablet Take 1 tablet (100 mg total) by mouth at bedtime. (Patient not taking: Reported on 07/01/2019) 30 tablet 1 More than a month at Unknown time    Patient Stressors: Medication change or noncompliance Substance abuse  Patient Strengths: Supportive family/friends  Treatment Modalities: Medication Management, Group therapy, Case management,  1 to 1 session with clinician, Psychoeducation, Recreational therapy.   Physician Treatment Plan for Primary Diagnosis: Schizoaffective disorder, bipolar type ([place]) Long Term Goal(s): Improvement in symptoms so as ready for discharge Improvement in symptoms so as ready for discharge   Short Term Goals: Ability to identify changes in lifestyle to reduce recurrence of condition will improve Ability to verbalize feelings will improve Ability to disclose and discuss suicidal ideas Ability to demonstrate self-control will improve Ability to identify and develop effective coping behaviors will improve Ability to maintain clinical measurements within normal limits will improve Compliance with prescribed medications will improve Ability to identify changes in lifestyle to reduce recurrence of condition will improve Ability to verbalize feelings will improve Ability to disclose and discuss suicidal ideas Ability to identify and develop effective coping behaviors will improve Ability to maintain clinical measurements within normal limits will improve  Medication Management: Evaluate patient's response, side effects, and tolerance of medication regimen.  Therapeutic Interventions: 1 to 1 sessions, Unit Group sessions  and Medication administration.  Evaluation of Outcomes: Adequate for Discharge  Physician Treatment Plan for Secondary Diagnosis: Principal Problem:   Schizoaffective disorder, bipolar type (HCC) Active Problems:   Schizoaffective disorder (HCC)  Long Term  Goal(s): Improvement in symptoms so as ready for discharge Improvement in symptoms so as ready for discharge   Short Term Goals: Ability to identify changes in lifestyle to reduce recurrence of condition will improve Ability to verbalize feelings will improve Ability to disclose and discuss suicidal ideas Ability to demonstrate self-control will improve Ability to identify and develop effective coping behaviors will improve Ability to maintain clinical measurements within normal limits will improve Compliance with prescribed medications will improve Ability to identify changes in lifestyle to reduce recurrence of condition will improve Ability to verbalize feelings will improve Ability to disclose and discuss suicidal ideas Ability to identify and develop effective coping behaviors will improve Ability to maintain clinical measurements within normal limits will improve     Medication Management: Evaluate patient's response, side effects, and tolerance of medication regimen.  Therapeutic Interventions: 1 to 1 sessions, Unit Group sessions and Medication administration.  Evaluation of Outcomes: Adequate for Discharge   RN Treatment Plan for Primary Diagnosis: Schizoaffective disorder, bipolar type (HCC) Long Term Goal(s): Knowledge of disease and therapeutic regimen to maintain health will improve  Short Term Goals: Ability to identify and develop effective coping behaviors will improve and Compliance with prescribed medications will improve  Medication Management: RN will administer medications as ordered by provider, will assess and evaluate patient's response and provide education to patient for prescribed medication. RN will report any adverse and/or side effects to prescribing provider.  Therapeutic Interventions: 1 on 1 counseling sessions, Psychoeducation, Medication administration, Evaluate responses to treatment, Monitor vital signs and CBGs as ordered, Perform/monitor CIWA,  COWS, AIMS and Fall Risk screenings as ordered, Perform wound care treatments as ordered.  Evaluation of Outcomes: Adequate for Discharge   LCSW Treatment Plan for Primary Diagnosis: Schizoaffective disorder, bipolar type (HCC) Long Term Goal(s): Safe transition to appropriate next level of care at discharge, Engage patient in therapeutic group addressing interpersonal concerns.  Short Term Goals: Engage patient in aftercare planning with referrals and resources, Increase social support and Increase skills for wellness and recovery  Therapeutic Interventions: Assess for all discharge needs, 1 to 1 time with Social worker, Explore available resources and support systems, Assess for adequacy in community support network, Educate family and significant other(s) on suicide prevention, Complete Psychosocial Assessment, Interpersonal group therapy.  Evaluation of Outcomes: Adequate for Discharge   Progress in Treatment: Attending groups: Yes. Participating in groups: No. Taking medication as prescribed: Yes. Toleration medication: Yes. Family/Significant other contact made: No, will contact:  attempts made Patient understands diagnosis: No. Discussing patient identified problems/goals with staff: Yes. Medical problems stabilized or resolved: Yes. Denies suicidal/homicidal ideation: Yes. Issues/concerns per patient self-inventory: No. Other: none  New problem(s) identified: No, Describe:  none  New Short Term/Long Term Goal(s):  Patient Goals:  "get back on my medicine"  Discharge Plan or Barriers:   Reason for Continuation of Hospitalization: Other; describe none: discharge today  Estimated Length of Stay:disharge today  Attendees: Patient:Theresa [name] 07/16/2019   Physician: Dr. Jeannine Kitten, MD 07/16/2019   Nursing: Liborio Nixon, RN 07/16/2019   RN Care Manager: 07/16/2019   Social Worker: Daleen Squibb, LCSW 07/16/2019   Recreational Therapist:  07/16/2019   Other:  07/16/2019    Other:  07/16/2019   Other: 07/16/2019  Scribe for Treatment Team: Wyn Quaker 07/16/2019 1:32 PM

## 2019-07-16 NOTE — BHH Suicide Risk Assessment (Signed)
BHH INPATIENT:  Family/Significant Other Suicide Prevention Education  Suicide Prevention Education:  Patient Refusal for Family/Significant Other Suicide Prevention Education: The patient Nishita Isaacks has refused to provide written consent for family/significant other to be provided Family/Significant Other Suicide Prevention Education during admission and/or prior to discharge.  Physician notified.  Evorn Gong 07/16/2019, 10:33 AM

## 2019-07-16 NOTE — BHH Suicide Risk Assessment (Signed)
BHH INPATIENT:  Family/Significant Other Suicide Prevention Education  Suicide Prevention Education:  Contact Attempts:  Versie Starks, mother, [phone], has been identified by the patient as the family member/significant other with whom the patient will be residing, and identified as the person(s) who will aid the patient in the event of a mental health crisis.  With written consent from the patient, two attempts were made to provide suicide prevention education, prior to and/or following the patient's discharge.  We were unsuccessful in providing suicide prevention education.  A suicide education pamphlet was given to the patient to share with family/significant other.  Date and time of first attempt:07/16/19 1000 Date and time of second attempt:07/16/19 1231  Lorri Frederick, [place] 07/16/2019, 12:30 PM

## 2019-07-16 NOTE — Progress Notes (Signed)
  [facility] Adult Case Management Discharge Plan :  Will you be returning to the same living situation after discharge:  Yes,  with mother At discharge, do you have transportation home?: Yes,  mother Do you have the ability to pay for your medications: Yes,  medicare/medicaid  Release of information consent forms completed and in the chart;  Patient's signature needed at discharge.  Patient to Follow up at: Follow-up Information    [facility]. Go on 07/21/2019.   Why: You are scheduled for an appointment on  07/21/19 at 10:00 am.  This appointment will be held in person.  Please have your insurance information and your discharge summary available.   Contact information: [address] [place] [address] [phone]           Next level of care provider has access to [facility] Link:no  Safety Planning and Suicide Prevention discussed: No. Attempts made.      Has patient been referred to the Quitline?: N/A patient is not a smoker  Patient has been referred for addiction treatment: N/A  Lorri Frederick, LCSW 07/16/2019, 12:31 PM

## 2019-07-16 NOTE — BHH Group Notes (Signed)
B[facility] LCSW Group Therapy Note  Date/Time:  07/16/2019 9:00-10:00 or 10:00-11:00AM  Type of Therapy and Topic:  Group Therapy:  Healthy and Unhealthy Supports  Participation Level:  Minimal   Description of Group:  Patients in this group were introduced to the idea of adding a variety of healthy supports to address the various needs in their lives.Patients discussed what additional healthy supports could be helpful in their recovery and wellness after discharge in order to prevent future hospitalizations.   An emphasis was placed on using counselor, doctor, therapy groups, 12-step groups, and problem-specific support groups to expand supports.  They also worked as a group on developing a specific plan for several patients to deal with unhealthy supports through boundary-setting, psychoeducation with loved ones, and even termination of relationships.   Therapeutic Goals:   1)  discuss importance of adding supports to stay well once out of the hospital  2)  compare healthy versus unhealthy supports and identify some examples of each  3)  generate ideas and descriptions of healthy supports that can be added  4)  offer mutual support about how to address unhealthy supports  5)  encourage active participation in and adherence to discharge plan    Summary of Patient Progress:  The patient stated that the main current healthy support in her life is her mother while current unhealthy supports include past relationships.  The patient was able to remain in the group until end and left ell before the group ended.  Therapeutic Modalities:   Motivational Interviewing Brief Solution-Focused Therapy  Evorn Gong

## 2019-07-16 NOTE — Progress Notes (Signed)
Spirituality group facilitated by Chaplain Sianna Garofano, MDiv, BCC.  Group focused on the topic of "hope."   Patients were led in a facilitated discussion, creating a word cloud to respond to the prompt "Hope is."   Patients named experiences both past and present that inform their understanding of hope  Patients engaged in a visual explorer activity - choosing a photo that represented hope for today.    DID NOT ATTEND  
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
## 2019-07-16 NOTE — Progress Notes (Signed)
D:  Patient's self inventory sheet, patient sleeps good, sleep medication helpful.  Good appetite, normal energy level, good concentration.  Denied depression.  Denied withdrawals.  Denied SI.  Denied physical problems.  Denied physical pain.  "Legs hurt can't stand for long periods of time."  Plans to talk to MD about meds.  Plans to do what is right.  Stop doing the wrong thing.  Does have discharge plans. A:  Medications administered per MD orders.  Emotional support and encouragement given patient. R:  Denied SI and HI, contracts for safety.  Denied A/V hallucinations.  Safety maintained with 15 minute checks.

## 2019-07-16 NOTE — Progress Notes (Signed)
Supervisor met with pt to discuss discharge.  We discussed the ACT team as recommended by Dr Jake Samples and supervisor explained how this worked.  Pt said she did not want the MD to come to her house, she preferred to keep [facility] her appts with Dr Hoyle Barr.  She agreed and signed the ROI.  She said she has spoken to her mother who is aware of her discharge today and willing to come pick her up. Winferd Humphrey, MSW, LCSW Advanced [facility] Supervisor 07/16/2019 11:03 AM

## 2019-08-02 NOTE — Progress Notes (Deleted)
24 y.o. G0P0000 Legally Separated White or Caucasian Not Hispanic or Latino female here for annual exam.      No LMP recorded (lmp unknown). (Menstrual status: Irregular Periods).          Sexually active: {yes no:314532}  The current method of family planning is {contraception:315051}.    Exercising: {yes no:314532}  {types:19826} Smoker:  {YES [id number]  Health Maintenance: Pap: 08/03/2018 normal Neg HPV, 10-14-15 WNL +chlamydia History of abnormal Pap:  Yes- 2014 ASCUS + for HPV in the past TDaP:  *** Gardasil: Completed all 3    reports that she has been smoking cigarettes. She has been smoking about 0.50 packs per day. She has never used smokeless tobacco. She reports current alcohol use. She reports current drug use. Frequency: 2.00 times per week. Drugs: Marijuana, Methamphetamines, and Cocaine.  Past Medical History:  Diagnosis Date  . Abnormal vaginal Pap smear   . Anxiety   . Bipolar disorder ([facility])   . Depression   . Migraine without aura   . Polycystic ovary syndrome   . Psychosis ([facility])   . Schizophrenia ([facility])   . STD (sexually transmitted disease) 10-14-15   chlamydia     No past surgical history on file.  Current Outpatient Medications  Medication Sig Dispense Refill  . benztropine (COGENTIN) 1 MG tablet Take 1 tablet (1 mg total) by mouth 2 (two) times daily. 60 tablet 2  . clonazePAM (KLONOPIN) 0.5 MG tablet Take 1 tablet (0.5 mg total) by mouth 2 (two) times daily for 3 days. 6 tablet 0  . omega-3 acid ethyl esters (LOVAZA) 1 g capsule Take 1 capsule (1 g total) by mouth 2 (two) times daily. 60 capsule 2  . risperiDONE (RISPERDAL) 2 MG tablet 1 in am 2 hs 90 tablet 2  . traZODone (DESYREL) 100 MG tablet Take 1 tablet (100 mg total) by mouth at bedtime as needed for sleep. 30 tablet 1   No current facility-administered medications for this visit.    Family History  Problem Relation Age of Onset  . Hypertension Mother   . Heart disease Mother   . Cancer  Mother   . Hypertension Other   . Depression Father   . Drug abuse Father   . Bipolar disorder Father   . Heart disease Maternal Grandmother   . Pancreatitis Maternal Grandfather   . Cancer Paternal Grandmother   . Cancer Paternal Grandfather     Review of Systems  Exam:   LMP  (LMP Unknown)   Weight change: @WEIGHTCHANGE @ Height:      Ht Readings from Last 3 Encounters:  06/30/19 5' 3.5" (1.613 m)  03/15/19 5' 3.5" (1.613 m)  09/11/18 5\' 6"  (1.676 m)    General appearance: alert, cooperative and appears stated age Head: Normocephalic, without obvious abnormality, atraumatic Neck: no adenopathy, supple, symmetrical, trachea midline and thyroid {CHL AMB PHY EX THYROID NORM DEFAULT:[phone]::"normal to inspection and palpation"} Lungs: clear to auscultation bilaterally Cardiovascular: regular rate and rhythm Breasts: {Exam; breast:13139::"normal appearance, no masses or tenderness"} Abdomen: soft, non-tender; non distended,  no masses,  no organomegaly Extremities: extremities normal, atraumatic, no cyanosis or edema Skin: Skin color, texture, turgor normal. No rashes or lesions Lymph nodes: Cervical, supraclavicular, and axillary nodes normal. No abnormal inguinal nodes palpated Neurologic: Grossly normal   Pelvic: External genitalia:  no lesions              Urethra:  normal appearing urethra with no masses, tenderness or lesions  Bartholins and Skenes: normal                 Vagina: normal appearing vagina with normal color and discharge, no lesions              Cervix: {CHL AMB PHY EX CERVIX NORM DEFAULT:[id number]::"no lesions"}               Bimanual Exam:  Uterus:  {CHL AMB PHY EX UTERUS NORM DEFAULT:[phone]::"normal size, contour, position, consistency, mobility, non-tender"}              Adnexa: {CHL AMB PHY EX ADNEXA NO MASS [phone]::"no mass, fullness, tenderness"}               Rectovaginal: Confirms               Anus:  normal  sphincter tone, no lesions  *** chaperoned for the exam.  A:  Well Woman with normal exam  P:

## 2019-08-05 ENCOUNTER — Ambulatory Visit: Payer: Medicaid Other | Admitting: Obstetrics and Gynecology

## 2019-08-05 ENCOUNTER — Encounter: Payer: Self-pay | Admitting: Obstetrics and Gynecology

## 2019-09-27 ENCOUNTER — Encounter (HOSPITAL_COMMUNITY): Payer: Self-pay | Admitting: *Deleted

## 2019-09-27 ENCOUNTER — Other Ambulatory Visit: Payer: Self-pay

## 2019-09-27 ENCOUNTER — Emergency Department (HOSPITAL_COMMUNITY)
Admission: EM | Admit: 2019-09-27 | Discharge: 2019-09-28 | Disposition: A | Discharge status: Home / Self Care | Payer: Medicare Other | Attending: Emergency Medicine | Admitting: Emergency Medicine

## 2019-09-27 DIAGNOSIS — F1410 Cocaine abuse, uncomplicated: Secondary | ICD-10-CM | POA: Diagnosis not present

## 2019-09-27 DIAGNOSIS — F17210 Nicotine dependence, cigarettes, uncomplicated: Secondary | ICD-10-CM | POA: Insufficient documentation

## 2019-09-27 DIAGNOSIS — Z20822 Contact with and (suspected) exposure to covid-19: Secondary | ICD-10-CM | POA: Insufficient documentation

## 2019-09-27 DIAGNOSIS — F1910 Other psychoactive substance abuse, uncomplicated: Secondary | ICD-10-CM | POA: Diagnosis not present

## 2019-09-27 DIAGNOSIS — Z046 Encounter for general psychiatric examination, requested by authority: Secondary | ICD-10-CM

## 2019-09-27 DIAGNOSIS — Z79899 Other long term (current) drug therapy: Secondary | ICD-10-CM | POA: Insufficient documentation

## 2019-09-27 DIAGNOSIS — F1210 Cannabis abuse, uncomplicated: Secondary | ICD-10-CM | POA: Insufficient documentation

## 2019-09-27 DIAGNOSIS — F209 Schizophrenia, unspecified: Secondary | ICD-10-CM | POA: Diagnosis not present

## 2019-09-27 LAB — SALICYLATE LEVEL: Salicylate Lvl: <7 mg/dL — ABNORMAL LOW (ref 7.0–30.0)

## 2019-09-27 LAB — CBC WITH DIFFERENTIAL/PLATELET
Abs Immature Granulocytes: 0.04 10*3/uL (ref 0.00–0.07)
Basophils Absolute: 0 10*3/uL (ref 0.0–0.1)
Basophils Relative: 0 %
Eosinophils Absolute: 0 10*3/uL (ref 0.0–0.5)
Eosinophils Relative: 0 %
HCT: 44 % (ref 36.0–46.0)
Hemoglobin: 14.6 g/dL (ref 12.0–15.0)
Immature Granulocytes: 0 %
Lymphocytes Relative: 17 %
Lymphs Abs: 1.7 10*3/uL (ref 0.7–4.0)
MCH: 30.8 pg (ref 26.0–34.0)
MCHC: 33.2 g/dL (ref 30.0–36.0)
MCV: 92.8 fL (ref 80.0–100.0)
Monocytes Absolute: 0.6 10*3/uL (ref 0.1–1.0)
Monocytes Relative: 6 %
Neutro Abs: 7.5 10*3/uL (ref 1.7–7.7)
Neutrophils Relative %: 77 %
Platelets: 368 10*3/uL (ref 150–400)
RBC: 4.74 MIL/uL (ref 3.87–5.11)
RDW: 12.6 % (ref 11.5–15.5)
WBC: 9.9 10*3/uL (ref 4.0–10.5)
nRBC: 0 % (ref 0.0–0.2)

## 2019-09-27 LAB — COMPREHENSIVE METABOLIC PANEL
ALT: 18 U/L (ref 0–44)
AST: 18 U/L (ref 15–41)
Albumin: 4.4 g/dL (ref 3.5–5.0)
Alkaline Phosphatase: 94 U/L (ref 38–126)
Anion gap: 9 (ref 5–15)
BUN: 13 mg/dL (ref 6–20)
CO2: 26 mmol/L (ref 22–32)
Calcium: 9.2 mg/dL (ref 8.9–10.3)
Chloride: 103 mmol/L (ref 98–111)
Creatinine, Ser: 0.78 mg/dL (ref 0.44–1.00)
GFR calc Af Amer: >60 mL/min (ref >60)
GFR calc non Af Amer: >60 mL/min (ref >60)
Glucose, Bld: 113 mg/dL — ABNORMAL HIGH (ref 70–99)
Potassium: 3.6 mmol/L (ref 3.5–5.1)
Sodium: 138 mmol/L (ref 135–145)
Total Bilirubin: 0.6 mg/dL (ref 0.3–1.2)
Total Protein: 7.4 g/dL (ref 6.5–8.1)

## 2019-09-27 LAB — RAPID URINE DRUG SCREEN, HOSP PERFORMED
Amphetamines: POSITIVE — AB
Barbiturates: NOT DETECTED
Benzodiazepines: NOT DETECTED
Cocaine: NOT DETECTED
Opiates: POSITIVE — AB
Tetrahydrocannabinol: NOT DETECTED

## 2019-09-27 LAB — POC URINE PREG, ED
Preg Test, Ur: NEGATIVE
Preg Test, Ur: NEGATIVE

## 2019-09-27 LAB — ACETAMINOPHEN LEVEL: Acetaminophen (Tylenol), Serum: <10 ug/mL — ABNORMAL LOW (ref 10–30)

## 2019-09-27 LAB — ETHANOL: Alcohol, Ethyl (B): <10 mg/dL (ref <10)

## 2019-09-27 MED ORDER — ACETAMINOPHEN 325 MG PO TABS: 650.0000 mg | ORAL_TABLET | ORAL | Status: DC | PRN

## 2019-09-27 MED ORDER — ONDANSETRON HCL 4 MG PO TABS: 4.0000 mg | ORAL_TABLET | Freq: Three times a day (TID) | ORAL | Status: DC | PRN

## 2019-09-27 MED ORDER — ALUM & MAG HYDROXIDE-SIMETH 200-200-20 MG/5ML PO SUSP: 30.0000 mL | Freq: Four times a day (QID) | ORAL | Status: DC | PRN

## 2019-09-27 NOTE — ED Notes (Addendum)
Spoke with Durenda Age at [facility] and she stated that pt has a bed for in the morning. Per Durenda Age she stated as long as covid screening was negative, they do not require a covid test done to accept pt.

## 2019-09-27 NOTE — Progress Notes (Signed)
Patient meets inpatient criteria per Assunta Found, NP. Patient has been faxed out to the following facilities for review:   [facility]  [facility]  [facility]  [facility]  [facility]   [facility]  TTS will continue to follow and secure bed placement.   Drucilla Schmidt, MSW, LCSW-A Clinical Disposition Social Worker [facility]/TTS [phone]

## 2019-09-27 NOTE — ED Notes (Signed)
Pt has been wanded while in triage

## 2019-09-27 NOTE — ED Triage Notes (Signed)
Pt brought in by [employer] deputy with IVC paperwork that states pt has schizo effective disorder and has not slept in 4 days and is unable to recall what she did all day yesterday. The papers also state pt is unable to provide her medications so the prescriber is not sure if pt is taking her medications; pt states she feels "out of body, mind and spirit and feels like something could happen at any time"; pt denies auditory or visual hallucinations; pt denies any HI/SI; pt has bilateral swelling to feet

## 2019-09-27 NOTE — BH Assessment (Addendum)
Assessment Note  Theresa [name] is an 24 y.o. female. She presents to [facility] by police. Patient has IVC papers in place. Per ED notes from [facility] staff, "Pt brought in by [employer] deputy with IVC paperwork that states pt has schizo effective disorder and has not slept in 4 days and is unable to recall what she did all day yesterday. The papers also state pt is unable to provide her medications so the prescriber is not sure if pt is taking her medications; pt states she feels "out of body, mind and spirit and feels like something could happen at any time"; pt denies auditory or visual hallucinations; pt denies any HI/SI; pt has bilateral swelling to feet."  Clinician assessed patient on this day. She states, "I'm here because I'm in pain, my legs are swollen, and I'm feeling like I'm out of my mind". Patient diagnosed with Schizophrenia. States she hasn't slept in 4 days. Patient lives with her mother: Theresa [name] [phone]. She provides verbal consent to speak with her mother if needed.   Patient denies suicidal ideations. She denies history suicidal ideations, attempts, and/or gestures. Denies self mutilating behaviors. She doesn't know if she has a family history of mental illness. Denies stressors and/or triggers.  Depressive symptoms reported: Feeling angry/irritable, Loss of interest in usual pleasures, and Tearfulness. Patient reports increased anxiety and panic attacks daily for the past several days.   Patient denies HI. She denies prior history of aggressive behaviors. Patient however irritable and argumentative during the start of the assessment. She later calmed down and participated appropriately. Patient denies legal issues.   She reports AVH's. She has auditory hallucinations of people telling her she wants the "deals and not the deals". Patient also has a history of visual hallucinations- shadows. States she has not experienced the visual hallucinations in several months.   Denies  alcohol and drug use.   Patient does not have a current therapist and/or psychiatrist. She has seen a provider [facility] in the past. However, does not recall the names of the medications she was prescribed. States she took them for 3.5 yrs but they didn't work for her.   Patient oriented to time, person, place, and situation. Speech was pressured at times. Affect was irritable. Mood was irritable. Her insight and judgement appear poor. Her impulse control appears fair. She was dressed in scrubs.    Diagnosis: Schizophrenia, Depressive Disorder, and Depressive Disorder  Past Medical History:  Past Medical History:  Diagnosis Date  . Abnormal vaginal Pap smear   . Anxiety   . Bipolar disorder (HCC)   . Depression   . Migraine without aura   . Polycystic ovary syndrome   . Psychosis (HCC)   . Schizophrenia (HCC)   . STD (sexually transmitted disease) 10-14-15   chlamydia     History reviewed. No pertinent surgical history.  Family History:  Family History  Problem Relation Age of Onset  . Hypertension Mother   . Heart disease Mother   . Cancer Mother   . Hypertension Other   . Depression Father   . Drug abuse Father   . Bipolar disorder Father   . Heart disease Maternal Grandmother   . Pancreatitis Maternal Grandfather   . Cancer Paternal Grandmother   . Cancer Paternal Grandfather     Social History:  reports that she has been smoking cigarettes. She has been smoking about 0.50 packs per day. She has never used smokeless tobacco. She reports current alcohol use. She reports current  drug use. Frequency: 2.00 times per week. Drugs: Marijuana, Methamphetamines, and Cocaine.  Additional Social History:  Alcohol / Drug Use Pain Medications: SEE MAR Prescriptions: SEE MAR Over the Counter: n/a History of alcohol / drug use?: Yes Longest period of sobriety (when/how long): unk Negative Consequences of Use: Personal relationships  CIWA: CIWA-Ar BP: 128/75 Pulse Rate:  79 COWS:    Allergies:  Allergies  Allergen Reactions  . Lamictal [Lamotrigine]   . Seroquel [Quetiapine]     Home Medications: (Not in a hospital admission)   OB/GYN Status:  No LMP recorded. (Menstrual status: Irregular Periods).  General Assessment Data TTS Assessment: In system Is this a Tele or Face-to-Face Assessment?: Tele Assessment Is this an Initial Assessment or a Re-assessment for this encounter?: Initial Assessment Patient Accompanied by:: (brought to [facility] on a IVC) Language Other than English: No Living Arrangements: (with mother ) What gender do you identify as?: Female Marital status: Separated Maiden name: Psychologist, counselling) Pregnancy Status: ("They didn't tell me but I gave urine") Living Arrangements: Parent Can pt return to current living arrangement?: Yes Admission Status: Voluntary Is patient capable of signing voluntary admission?: Yes Referral Source: Self/Family/Friend Insurance type: (Medicaid )     Crisis Care Plan Living Arrangements: Parent Legal Guardian: (no legal guardian ) Name of Psychiatrist: (no current psychiatrist; last seen a provider @ [facility]) Name of Therapist: (no current therapist; last seen a provider at [facility] )  Education Status Is patient currently in school?: No Is the patient employed, unemployed or receiving disability?: Unemployed("I support myself by helping other people")  Risk to self with the past 6 months Suicidal Ideation: No Has patient been a risk to self within the past 6 months prior to admission? : No Suicidal Intent: No Has patient had any suicidal intent within the past 6 months prior to admission? : No Is patient at risk for suicide?: No Suicidal Plan?: (n/a) Has patient had any suicidal plan within the past 6 months prior to admission? : No Access to Means: No(patient denies ) What has been your use of drugs/alcohol within the last 12 months?: (patient denies ) Previous Attempts/Gestures: No How many  times?: (0) Other Self Harm Risks: (n/a) Triggers for Past Attempts: Other (Comment) Intentional Self Injurious Behavior: None Family Suicide History: Unknown Recent stressful life event(s): ("Yall"..pt referring to hospital staff ) Persecutory voices/beliefs?: No Depression: Yes Depression Symptoms: Feeling angry/irritable, Loss of interest in usual pleasures, Tearfulness Substance abuse history and/or treatment for substance abuse?: No Suicide prevention information given to non-admitted patients: Not applicable  Risk to Others within the past 6 months Homicidal Ideation: No Does patient have any lifetime risk of violence toward others beyond the six months prior to admission? : No Thoughts of Harm to Others: No Current Homicidal Intent: No Current Homicidal Plan: No Access to Homicidal Means: No Identified Victim: (n/a) History of harm to others?: No Assessment of Violence: None Noted Violent Behavior Description: (patient iritable and uncoorperative at times,) Does patient have access to weapons?: No Criminal Charges Pending?: No Does patient have a court date: No Is patient on probation?: No  Psychosis Hallucinations: Auditory, Visual(Auditory-"They want to do deeds instead of deals") Delusions: None noted(Visual hallucinations-shadows in the past )  Mental Status Report Appearance/Hygiene: In scrubs Eye Contact: Fair Motor Activity: Freedom of movement Speech: Argumentative Level of Consciousness: Alert Mood: Depressed Affect: Apprehensive Anxiety Level: Severe Thought Processes: Relevant, Circumstantial Judgement: Impaired Orientation: Person, Place, Time, Situation Obsessive Compulsive Thoughts/Behaviors: None  Cognitive Functioning Concentration: Decreased  Memory: Recent Intact, Remote Intact Is patient IDD: No Insight: Poor Impulse Control: Fair Appetite: Good Have you had any weight changes? : No Change Sleep: ("I've been up for 4 days") Total Hours  of Sleep: (no sleep in 4 days ) Vegetative Symptoms: None  ADLScreening [facility] Assessment Services) Patient's cognitive ability adequate to safely complete daily activities?: Yes Patient able to express need for assistance with ADLs?: Yes Independently performs ADLs?: Yes (appropriate for developmental age)  Prior Inpatient Therapy Prior Inpatient Therapy: Yes Prior Therapy Dates: ("I don't remember" ) Prior Therapy Facilty/Provider(s): ("I can't remember") Reason for Treatment: ("I don't remember")  Prior Outpatient Therapy Prior Outpatient Therapy: Yes Prior Therapy Dates: (past) Prior Therapy Facilty/Provider(s): ([facility]-psychiatrist and therapist ) Reason for Treatment: (med management and therapy) Does patient have an ACCT team?: No Does patient have Intensive In-House Services?  : No Does patient have [facility] services? : No Does patient have P4CC services?: No  ADL Screening (condition at time of admission) Patient's cognitive ability adequate to safely complete daily activities?: Yes Is the patient deaf or have difficulty hearing?: No Does the patient have difficulty seeing, even when wearing glasses/contacts?: No Does the patient have difficulty concentrating, remembering, or making decisions?: No Patient able to express need for assistance with ADLs?: Yes Does the patient have difficulty dressing or bathing?: No Independently performs ADLs?: Yes (appropriate for developmental age) Does the patient have difficulty walking or climbing stairs?: No Weakness of Legs: None Weakness of Arms/Hands: None  Home Assistive Devices/Equipment Home Assistive Devices/Equipment: None  Therapy Consults (therapy consults require a physician order) PT Evaluation Needed: No OT Evalulation Needed: No SLP Evaluation Needed: No Abuse/Neglect Assessment (Assessment to be complete while patient is alone) Physical Abuse: Denies Verbal Abuse: Denies Sexual Abuse: Denies Exploitation of  patient/patient's resources: Denies Self-Neglect: Denies Values / Beliefs Cultural Requests During Hospitalization: None Spiritual Requests During Hospitalization: None Consults Spiritual Care Consult Needed: No Transition of Care Team Consult Needed: No Advance Directives (For Healthcare) Does Patient Have a Medical Advance Directive?: No          Disposition:  Per Shuvon Rankin, NP, patient meets criteria for inpatient treatment.  Disposition Initial Assessment Completed for this Encounter: Yes  On Site Evaluation by:   Reviewed with Physician:    Melynda Ripple 09/27/2019 5:24 PM

## 2019-09-27 NOTE — ED Provider Notes (Signed)
[facility] EMERGENCY DEPARTMENT Provider Note   CSN: [id number] Arrival date & time: 09/27/19  1508     History Chief Complaint  Patient presents with  . Medical Clearance    Theresa [name] is a 24 y.o. female with history of bipolar disorder, schizoaffective disorder, PCOS, polysubstance abuse presents brought in by her [facility] with IVC paperwork for psychiatric evaluation.  On my assessment the patient is drowsy but easily arousable.  She is oriented to person and place but not time.  She tells me she has not slept in 4 days.  She tells me that she has been doing "uppers".  She denies doing any other drugs.  She denies suicidal or homicidal ideation.  She denies any other complaints to me.  The history is provided by the patient.       Past Medical History:  Diagnosis Date  . Abnormal vaginal Pap smear   . Anxiety   . Bipolar disorder (HCC)   . Depression   . Migraine without aura   . Polycystic ovary syndrome   . Psychosis (HCC)   . Schizophrenia (HCC)   . STD (sexually transmitted disease) 10-14-15   chlamydia     Patient Active Problem List   Diagnosis Date Noted  . Schizoaffective disorder (HCC) 07/13/2019  . Psychotic disorder (HCC) 03/16/2019  . 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 Schizoaffective disorder, bipolar type (HCC) 10/17/2017  . UTI (urinary tract infection) 09/28/2017  . 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 Bipolar affective disorder (HCC) 09/20/2017  . Bipolar affective disorder, mixed, severe, with psychotic behavior (HCC) 10/04/2016  . PCOS (polycystic ovarian syndrome) 01/18/2016  . Cannabis use disorder, moderate, dependence (HCC) 12/06/2015  . Tobacco use disorder 12/05/2015    History reviewed. No pertinent surgical history.   OB History    Gravida  0   Para  0   Term  0   Preterm  0   AB  0   Living  0     SAB  0   TAB  0   Ectopic  0   Multiple  0   Live Births              Family History  Problem  Relation Age of Onset  . Hypertension Mother   . Heart disease Mother   . Cancer Mother   . Hypertension Other   . Depression Father   . Drug abuse Father   . Bipolar disorder Father   . Heart disease Maternal Grandmother   . Pancreatitis Maternal Grandfather   . Cancer Paternal Grandmother   . Cancer Paternal Grandfather     Social History   Tobacco Use  . Smoking status: Current Every Day Smoker    Packs/day: 0.50    Types: Cigarettes  . Smokeless tobacco: Never Used  Substance Use Topics  . Alcohol use: Yes    Alcohol/week: 0.0 standard drinks  . Drug use: Yes    Frequency: 2.0 times per week    Types: Marijuana, Methamphetamines, Cocaine    Comment: meth, heroin, crack    Home Medications Prior to Admission medications   Medication Sig Start Date End Date Taking? Authorizing Provider  benztropine (COGENTIN) 1 MG tablet Take 1 tablet (1 mg total) by mouth 2 (two) times daily. 07/16/19   Malvin Johns, MD  clonazePAM (KLONOPIN) 0.5 MG tablet Take 1 tablet (0.5 mg total) by mouth 2 (two) times daily for 3 days. 07/16/19 07/19/19  Malvin Johns, MD  escitalopram (  LEXAPRO) 10 MG tablet Take 10 mg by mouth daily. 09/04/19   [provider]  omega-3 acid ethyl esters (LOVAZA) 1 g capsule Take 1 capsule (1 g total) by mouth 2 (two) times daily. 07/16/19   Johnn Hai, MD  risperiDONE (RISPERDAL) 2 MG tablet 1 in am 2 hs 07/16/19   Johnn Hai, MD  traZODone (DESYREL) 100 MG tablet Take 1 tablet (100 mg total) by mouth at bedtime as needed for sleep. 07/16/19   Johnn Hai, MD  ziprasidone (GEODON) 60 MG capsule Take 60 mg by mouth every morning. 09/04/19   [provider]    Allergies    Lamictal [lamotrigine] and Seroquel [quetiapine]  Review of Systems   Review of Systems  Constitutional: Negative for fever.  Respiratory: Negative for shortness of breath.   Cardiovascular: Negative for chest pain.  Gastrointestinal: Negative for abdominal pain, nausea and  vomiting.  Psychiatric/Behavioral: Positive for hallucinations and sleep disturbance. Negative for suicidal ideas.  All other systems reviewed and are negative.   Physical Exam Updated Vital Signs BP 130/80 (BP Location: Right Arm)   Pulse 75   Temp 98.5 F (36.9 C) (Oral)   Resp 16   Ht 5\' 3"  (1.6 m)   Wt 81.6 kg   SpO2 98%   BMI 31.89 kg/m   Physical Exam Vitals and nursing note reviewed.  Constitutional:      General: She is not in acute distress.    Appearance: She is well-developed.     Comments: Sleeping but easily arousable  HENT:     Head: Normocephalic and atraumatic.  Eyes:     General:        Right eye: No discharge.        Left eye: No discharge.     Conjunctiva/sclera: Conjunctivae normal.  Neck:     Vascular: No JVD.     Trachea: No tracheal deviation.  Cardiovascular:     Rate and Rhythm: Normal rate and regular rhythm.  Pulmonary:     Effort: Pulmonary effort is normal.     Breath sounds: Normal breath sounds.  Abdominal:     General: Bowel sounds are normal. There is no distension.     Palpations: Abdomen is soft.     Tenderness: There is no abdominal tenderness. There is no guarding or rebound.  Musculoskeletal:     Cervical back: Neck supple.  Skin:    General: Skin is warm and dry.     Findings: No erythema.  Neurological:     Mental Status: She is alert.     Comments: Drowsy but arouses to voice.  Follows commands.  Oriented to person and place but not time.  Moves extremities spontaneously without difficulty.  No facial droop noted.  Psychiatric:        Mood and Affect: Affect is blunt.        Speech: Speech is delayed.        Behavior: Behavior is cooperative.        Thought Content: Thought content does not include homicidal or suicidal ideation. Thought content does not include homicidal or suicidal plan.        Cognition and Memory: Memory is impaired.        Judgment: Judgment is impulsive.     ED Results / Procedures /  Treatments   Labs (all labs ordered are listed, but only abnormal results are displayed) Labs Reviewed  COMPREHENSIVE METABOLIC PANEL - Abnormal; Notable for the following components:  Result Value   Glucose, Bld 113 (*)    All other components within normal limits  RAPID URINE DRUG SCREEN, HOSP PERFORMED - Abnormal; Notable for the following components:   Opiates POSITIVE (*)    Amphetamines POSITIVE (*)    All other components within normal limits  SALICYLATE LEVEL - Abnormal; Notable for the following components:   Salicylate Lvl <7.0 (*)    All other components within normal limits  ACETAMINOPHEN LEVEL - Abnormal; Notable for the following components:   Acetaminophen (Tylenol), Serum <10 (*)    All other components within normal limits  RESPIRATORY PANEL BY RT PCR (FLU A&B, COVID)  ETHANOL  CBC WITH DIFFERENTIAL/PLATELET  POC URINE PREG, ED  POC URINE PREG, ED    EKG None  Radiology No results found.  Procedures Procedures (including critical care time)  Medications Ordered in ED Medications  alum & mag hydroxide-simeth (MAALOX/MYLANTA) 200-200-20 MG/5ML suspension 30 mL (has no administration in time range)  ondansetron (ZOFRAN) tablet 4 mg (has no administration in time range)  acetaminophen (TYLENOL) tablet 650 mg (has no administration in time range)    ED Course  I have reviewed the triage vital signs and the nursing notes.  Pertinent labs & imaging results that were available during my care of the patient were reviewed by me and considered in my medical decision making (see chart for details).    MDM Rules/Calculators/A&P                      Patient presents under IVC for psychiatric evaluation.  She is afebrile, vital signs are stable.  She is drowsy but easily arousable.  She tells me that she has not slept in 4 days.  She is somewhat difficult to obtain history from due to lack of cooperation.  However physical examination is reassuring.  Screening  labs reviewed and interpreted by myself shows no leukocytosis, no anemia, no metabolic derangements, no renal insufficiency.  Her UDS is positive for opiates and amphetamines.  I suspect that she may have used opiates in some form of prior to arrival explaining her drowsiness.  Clinically she appears well.  She is medically cleared for TTS evaluation at this time.  TTS recommends inpatient admission.  Final Clinical Impression(s) / ED Diagnoses Final diagnoses:  Involuntary commitment    Rx / DC Orders ED Discharge Orders    None       Bennye Alm 09/27/19 2315    Pollyann Savoy, MD 09/27/19 2321

## 2019-09-27 NOTE — ED Notes (Signed)
Pt has been changed into purple scrubs and belongings are locked up.

## 2019-09-27 NOTE — Progress Notes (Signed)
Pt accepted to [facility]; Room to be determined.   Dr. Ellamae Sia is the accepting and attending physician.   Call report to [phone].   Brooke @ [facility] notified.     Pt is IVC.    Pt may be transported by  [facility].   Pt scheduled to arrive on 09/28/19 anytime after 9:00am. [facility] requested IVC be faxed to [phone]. When pt arrives tomorrow, she will need to get her temperature checked at the [facility] before admitting to the unit.   Drucilla Schmidt, MSW, LCSW-A Clinical Disposition Social Worker [facility]/TTS [phone]

## 2019-09-28 DIAGNOSIS — F209 Schizophrenia, unspecified: Secondary | ICD-10-CM | POA: Diagnosis not present

## 2019-09-28 LAB — RESPIRATORY PANEL BY RT PCR (FLU A&B, COVID)
Influenza A by PCR: NEGATIVE
Influenza B by PCR: NEGATIVE
SARS Coronavirus 2 by RT PCR: NEGATIVE

## 2019-09-28 NOTE — ED Notes (Signed)
[facility] here to transport.

## 2019-10-24 ENCOUNTER — Ambulatory Visit (HOSPITAL_COMMUNITY)
Admission: RE | Admit: 2019-10-24 | Discharge: 2019-10-24 | Disposition: A | Discharge status: Home / Self Care | Payer: Medicare Other | Attending: Psychiatry | Admitting: Psychiatry

## 2019-10-24 DIAGNOSIS — F209 Schizophrenia, unspecified: Secondary | ICD-10-CM | POA: Insufficient documentation
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
## 2019-10-24 NOTE — BH Assessment (Signed)
Assessment Note  Theresa [name] is an 24 y.o. female, who was brought to [facility] by Mother.  The Mother reports Patient did not take psychotropic medication yesterday and also reported some medical issues of swollen feet and urination issues.  She also reports the Patient is experiencing AH of voices.  The Mother was requesting medications for the Patient.  Patient presented orientated x4,  mood not disclosed and affect moderately anxious.  Patient denied SI, HI, and AVH.  Patient was interested in attending group therapy.  The Patient's Mother reports she has been hospitalized 2x since February 2021 at [facility] and [facility] for psychosis.   Per Ricky Ala, NP; Patient does not meet inpatient criteria and was given contact information for [facility].  The Mother declined a list of Psychiatrists.     Diagnosis: Schizophrenia   Past Medical History:  Past Medical History:  Diagnosis Date  . Abnormal vaginal Pap smear   . Anxiety   . Bipolar disorder ([place])   . Depression   . Migraine without aura   . Polycystic ovary syndrome   . Psychosis ([place])   . Schizophrenia ([place])   . STD (sexually transmitted disease) 10-14-15   chlamydia     No past surgical history on file.  Family History:  Family History  Problem Relation Age of Onset  . Hypertension Mother   . Heart disease Mother   . Cancer Mother   . Hypertension Other   . Depression Father   . Drug abuse Father   . Bipolar disorder Father   . Heart disease Maternal Grandmother   . Pancreatitis Maternal Grandfather   . Cancer Paternal Grandmother   . Cancer Paternal Grandfather     Social History:  reports that she has been smoking cigarettes. She has been smoking about 0.50 packs per day. She has never used smokeless tobacco. She reports current alcohol use. She reports current drug use. Frequency: 2.00 times per week. Drugs: Marijuana, Methamphetamines, and Cocaine.  Additional Social History:     CIWA:   COWS:     Allergies:  Allergies  Allergen Reactions  . Lamictal [Lamotrigine]   . Seroquel [Quetiapine]     Home Medications: (Not in a hospital admission)   OB/GYN Status:  No LMP recorded. (Menstrual status: Irregular Periods).  General Assessment Data Location of Assessment: [facility] Assessment Services TTS Assessment: In system Is this a Tele or Face-to-Face Assessment?: Face-to-Face Is this an Initial Assessment or a Re-assessment for this encounter?: Initial Assessment Patient Accompanied by:: Adult(Mother) Permission Given to speak with another: Yes Name, Relationship and Phone Number: Mother(Mother was present ) Language Other than English: No Living Arrangements: Other (Comment)(Does not have independent housing, lives with others ) What gender do you identify as?: Female Marital status: Single Maiden name: Messinger Pregnancy Status: Unknown Living Arrangements: Other (Comment)(No independent housing) Can pt return to current living arrangement?: Yes Admission Status: Voluntary Is patient capable of signing voluntary admission?: Yes Referral Source: Self/Family/Friend(Mother) Insurance type: Passenger transport manager)  Medical Screening Exam ([place]) Medical Exam completed: No Reason for MSE not completed: ([place] walk in)  Crisis Care Plan Living Arrangements: Other (Comment)(No independent housing) Name of Psychiatrist: [facility]  Name of Therapist: None  Education Status Is patient currently in school?: No  Risk to self with the past 6 months Suicidal Ideation: No Has patient been a risk to self within the past 6 months prior to admission? : No Suicidal Intent: No Has patient had any suicidal  intent within the past 6 months prior to admission? : No Is patient at risk for suicide?: No Suicidal Plan?: No Has patient had any suicidal plan within the past 6 months prior to admission? : No Access to Means: No What has been your use of drugs/alcohol within the last 12  months?: None Previous Attempts/Gestures: No Triggers for Past Attempts: None known Intentional Self Injurious Behavior: None Family Suicide History: Unknown Recent stressful life event(s): Other (Comment)(Broke up with Boy Friend) Persecutory voices/beliefs?: No Depression: No Substance abuse history and/or treatment for substance abuse?: No Suicide prevention information given to non-admitted patients: Not applicable  Risk to Others within the past 6 months Homicidal Ideation: No Does patient have any lifetime risk of violence toward others beyond the six months prior to admission? : No Thoughts of Harm to Others: No Current Homicidal Intent: No Current Homicidal Plan: No Access to Homicidal Means: No History of harm to others?: No Assessment of Violence: None Noted Does patient have access to weapons?: No Criminal Charges Pending?: No Does patient have a court date: No Is patient on probation?: No  Psychosis Hallucinations: Auditory Delusions: None noted  Mental Status Report Appearance/Hygiene: Unremarkable Eye Contact: Fair Motor Activity: Unremarkable Speech: Soft, Logical/coherent Level of Consciousness: Alert Mood: Anxious Affect: Anxious Anxiety Level: Minimal Thought Processes: Coherent, Thought Blocking, Relevant Judgement: Unimpaired Orientation: Person, Place, Time, Situation Obsessive Compulsive Thoughts/Behaviors: None  Cognitive Functioning Concentration: Fair Memory: Recent Intact, Remote Intact Is patient IDD: No Insight: Fair Impulse Control: Good Appetite: Good Have you had any weight changes? : No Change Sleep: No Change Total Hours of Sleep: 6 Vegetative Symptoms: None  ADLScreening [facility] Assessment Services) Patient's cognitive ability adequate to safely complete daily activities?: Yes Patient able to express need for assistance with ADLs?: Yes Independently performs ADLs?: Yes (appropriate for developmental age)  Prior Inpatient  Therapy Prior Inpatient Therapy: Yes Prior Therapy Dates: Feb 2021 Prior Therapy Facilty/Provider(s): [facility] Reason for Treatment: Schizophrenia   Prior Outpatient Therapy Prior Outpatient Therapy: Yes Prior Therapy Dates: [facility] (Med management only) Reason for Treatment: Schizophrenia Does patient have an ACCT team?: No Does patient have Intensive In-House Services?  : No Does patient have [facility] services? : No Does patient have P4CC services?: No  ADL Screening (condition at time of admission) Patient's cognitive ability adequate to safely complete daily activities?: Yes Is the patient deaf or have difficulty hearing?: No Does the patient have difficulty seeing, even when wearing glasses/contacts?: No Does the patient have difficulty concentrating, remembering, or making decisions?: No Patient able to express need for assistance with ADLs?: Yes Does the patient have difficulty dressing or bathing?: No Independently performs ADLs?: Yes (appropriate for developmental age) Does the patient have difficulty walking or climbing stairs?: No Weakness of Legs: None Weakness of Arms/Hands: None  Home Assistive Devices/Equipment Home Assistive Devices/Equipment: None      Values / Beliefs Cultural Requests During Hospitalization: None Spiritual Requests During Hospitalization: None Consults Spiritual Care Consult Needed: No Advance Directives (For Healthcare) Does Patient Have a Medical Advance Directive?: No Would patient like information on creating a medical advance directive?: No - Patient declined          Disposition:  Disposition Initial Assessment Completed for this Encounter: Yes Disposition of Patient: Discharge Mode of transportation if patient is discharged/movement?: Car Patient referred to: Vesta Mixer )  On Site Evaluation by:   Reviewed with Physician:    Dey-Johnson,Reyanna Baley 10/24/2019 6:44 PM

## 2020-01-15 ENCOUNTER — Other Ambulatory Visit: Payer: Self-pay

## 2020-01-15 ENCOUNTER — Emergency Department (HOSPITAL_COMMUNITY)
Admission: EM | Admit: 2020-01-15 | Discharge: 2020-01-15 | Disposition: A | Discharge status: Home / Self Care | Payer: Medicare Other | Attending: Emergency Medicine | Admitting: Emergency Medicine

## 2020-01-15 DIAGNOSIS — Y929 Unspecified place or not applicable: Secondary | ICD-10-CM | POA: Diagnosis not present

## 2020-01-15 DIAGNOSIS — Y999 Unspecified external cause status: Secondary | ICD-10-CM | POA: Diagnosis not present

## 2020-01-15 DIAGNOSIS — Y939 Activity, unspecified: Secondary | ICD-10-CM | POA: Diagnosis not present

## 2020-01-15 DIAGNOSIS — Z5321 Procedure and treatment not carried out due to patient leaving prior to being seen by health care provider: Secondary | ICD-10-CM | POA: Insufficient documentation

## 2020-01-15 DIAGNOSIS — S99921A Unspecified injury of right foot, initial encounter: Secondary | ICD-10-CM | POA: Diagnosis present

## 2020-01-15 DIAGNOSIS — X58XXXA Exposure to other specified factors, initial encounter: Secondary | ICD-10-CM | POA: Insufficient documentation

## 2020-01-15 NOTE — ED Notes (Signed)
Patient states since the wait is long she will bandage her foot herself and in the morning go get an xray from urgent care

## 2020-01-16 ENCOUNTER — Emergency Department (HOSPITAL_COMMUNITY): Payer: Medicare Other

## 2020-01-16 ENCOUNTER — Other Ambulatory Visit: Payer: Self-pay

## 2020-01-16 ENCOUNTER — Encounter (HOSPITAL_COMMUNITY): Payer: Self-pay | Admitting: *Deleted

## 2020-01-16 DIAGNOSIS — T148XXA Other injury of unspecified body region, initial encounter: Secondary | ICD-10-CM | POA: Insufficient documentation

## 2020-01-16 DIAGNOSIS — M545 Low back pain: Secondary | ICD-10-CM | POA: Insufficient documentation

## 2020-01-16 DIAGNOSIS — S93401A Sprain of unspecified ligament of right ankle, initial encounter: Secondary | ICD-10-CM | POA: Insufficient documentation

## 2020-01-16 DIAGNOSIS — F17210 Nicotine dependence, cigarettes, uncomplicated: Secondary | ICD-10-CM | POA: Insufficient documentation

## 2020-01-16 DIAGNOSIS — Y9389 Activity, other specified: Secondary | ICD-10-CM | POA: Diagnosis not present

## 2020-01-16 DIAGNOSIS — F1290 Cannabis use, unspecified, uncomplicated: Secondary | ICD-10-CM | POA: Diagnosis not present

## 2020-01-16 DIAGNOSIS — M25571 Pain in right ankle and joints of right foot: Secondary | ICD-10-CM | POA: Diagnosis present

## 2020-01-16 DIAGNOSIS — Y9289 Other specified places as the place of occurrence of the external cause: Secondary | ICD-10-CM | POA: Insufficient documentation

## 2020-01-16 DIAGNOSIS — Y998 Other external cause status: Secondary | ICD-10-CM | POA: Diagnosis not present
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
## 2020-01-17 ENCOUNTER — Emergency Department (HOSPITAL_COMMUNITY)
Admission: EM | Admit: 2020-01-17 | Discharge: 2020-01-17 | Disposition: A | Discharge status: Home / Self Care | Payer: Medicare Other | Attending: Emergency Medicine | Admitting: Emergency Medicine

## 2020-01-17 DIAGNOSIS — T148XXA Other injury of unspecified body region, initial encounter: Secondary | ICD-10-CM

## 2020-01-17 DIAGNOSIS — S93401A Sprain of unspecified ligament of right ankle, initial encounter: Secondary | ICD-10-CM
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
## 2020-01-17 NOTE — Discharge Instructions (Addendum)
Use the boot to help with the pain.  If symptoms not improve follow-up with orthopedic surgery.

## 2020-02-02 ENCOUNTER — Telehealth: Payer: Self-pay | Admitting: Orthopedic Surgery

## 2020-02-02 ENCOUNTER — Other Ambulatory Visit: Payer: Self-pay

## 2020-02-02 ENCOUNTER — Ambulatory Visit (INDEPENDENT_AMBULATORY_CARE_PROVIDER_SITE_OTHER): Payer: Medicare Other | Admitting: Orthopedic Surgery

## 2020-02-02 VITALS — BP 128/80 | HR 87 | Ht 63.5 in | Wt 229.0 lb

## 2020-02-02 DIAGNOSIS — S90811A Abrasion, right foot, initial encounter: Secondary | ICD-10-CM

## 2020-02-02 DIAGNOSIS — S9031XA Contusion of right foot, initial encounter: Secondary | ICD-10-CM

## 2020-02-02 DIAGNOSIS — L089 Local infection of the skin and subcutaneous tissue, unspecified: Secondary | ICD-10-CM | POA: Diagnosis not present

## 2020-02-02 MED ORDER — SULFAMETHOXAZOLE-TRIMETHOPRIM 800-160 MG PO TABS: 1.0000 | ORAL_TABLET | Freq: Two times a day (BID) | ORAL | 0 refills | Status: AC

## 2020-02-02 MED ORDER — IBUPROFEN 800 MG PO TABS: 800.0000 mg | ORAL_TABLET | Freq: Three times a day (TID) | ORAL | 1 refills | Status: AC | PRN

## 2020-02-02 NOTE — Telephone Encounter (Signed)
Called patient, per nurse, Tammy - multiple times unable to reach at main ph# [phone], no answer; we have also tried # [phone] - left message regarding review of dressing care, as noted on AVS. Okay to keep dressing with Zeroform from today's visit, 02/02/20, through tomorrow; then by Friday, 02/04/20, use soap and water, then dry dressing.

## 2020-02-02 NOTE — Patient Instructions (Signed)
Soap and water and antibiotics for the wound
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
## 2020-02-03 NOTE — Telephone Encounter (Signed)
Thanks if she calls back we will advise.

## 2020-02-17 ENCOUNTER — Encounter: Payer: Self-pay | Admitting: Orthopedic Surgery

## 2020-02-17 ENCOUNTER — Telehealth: Payer: Self-pay | Admitting: Orthopedic Surgery

## 2020-02-17 ENCOUNTER — Ambulatory Visit: Payer: Medicare Other | Admitting: Orthopedic Surgery
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
## 2020-02-17 NOTE — Telephone Encounter (Signed)
Call patient left voicemail appt was at 11:50 am

## 2020-03-27 DEATH — deceased

## 2020-04-15 IMAGING — CR DG CHEST 2V
2 series · 2 of 2 positions shown · non-contrast
Comparison: 10/26/2016

CLINICAL DATA: Shortness of breath with back and chest pain

EXAM:
CHEST - 2 VIEW

[w chest lat]
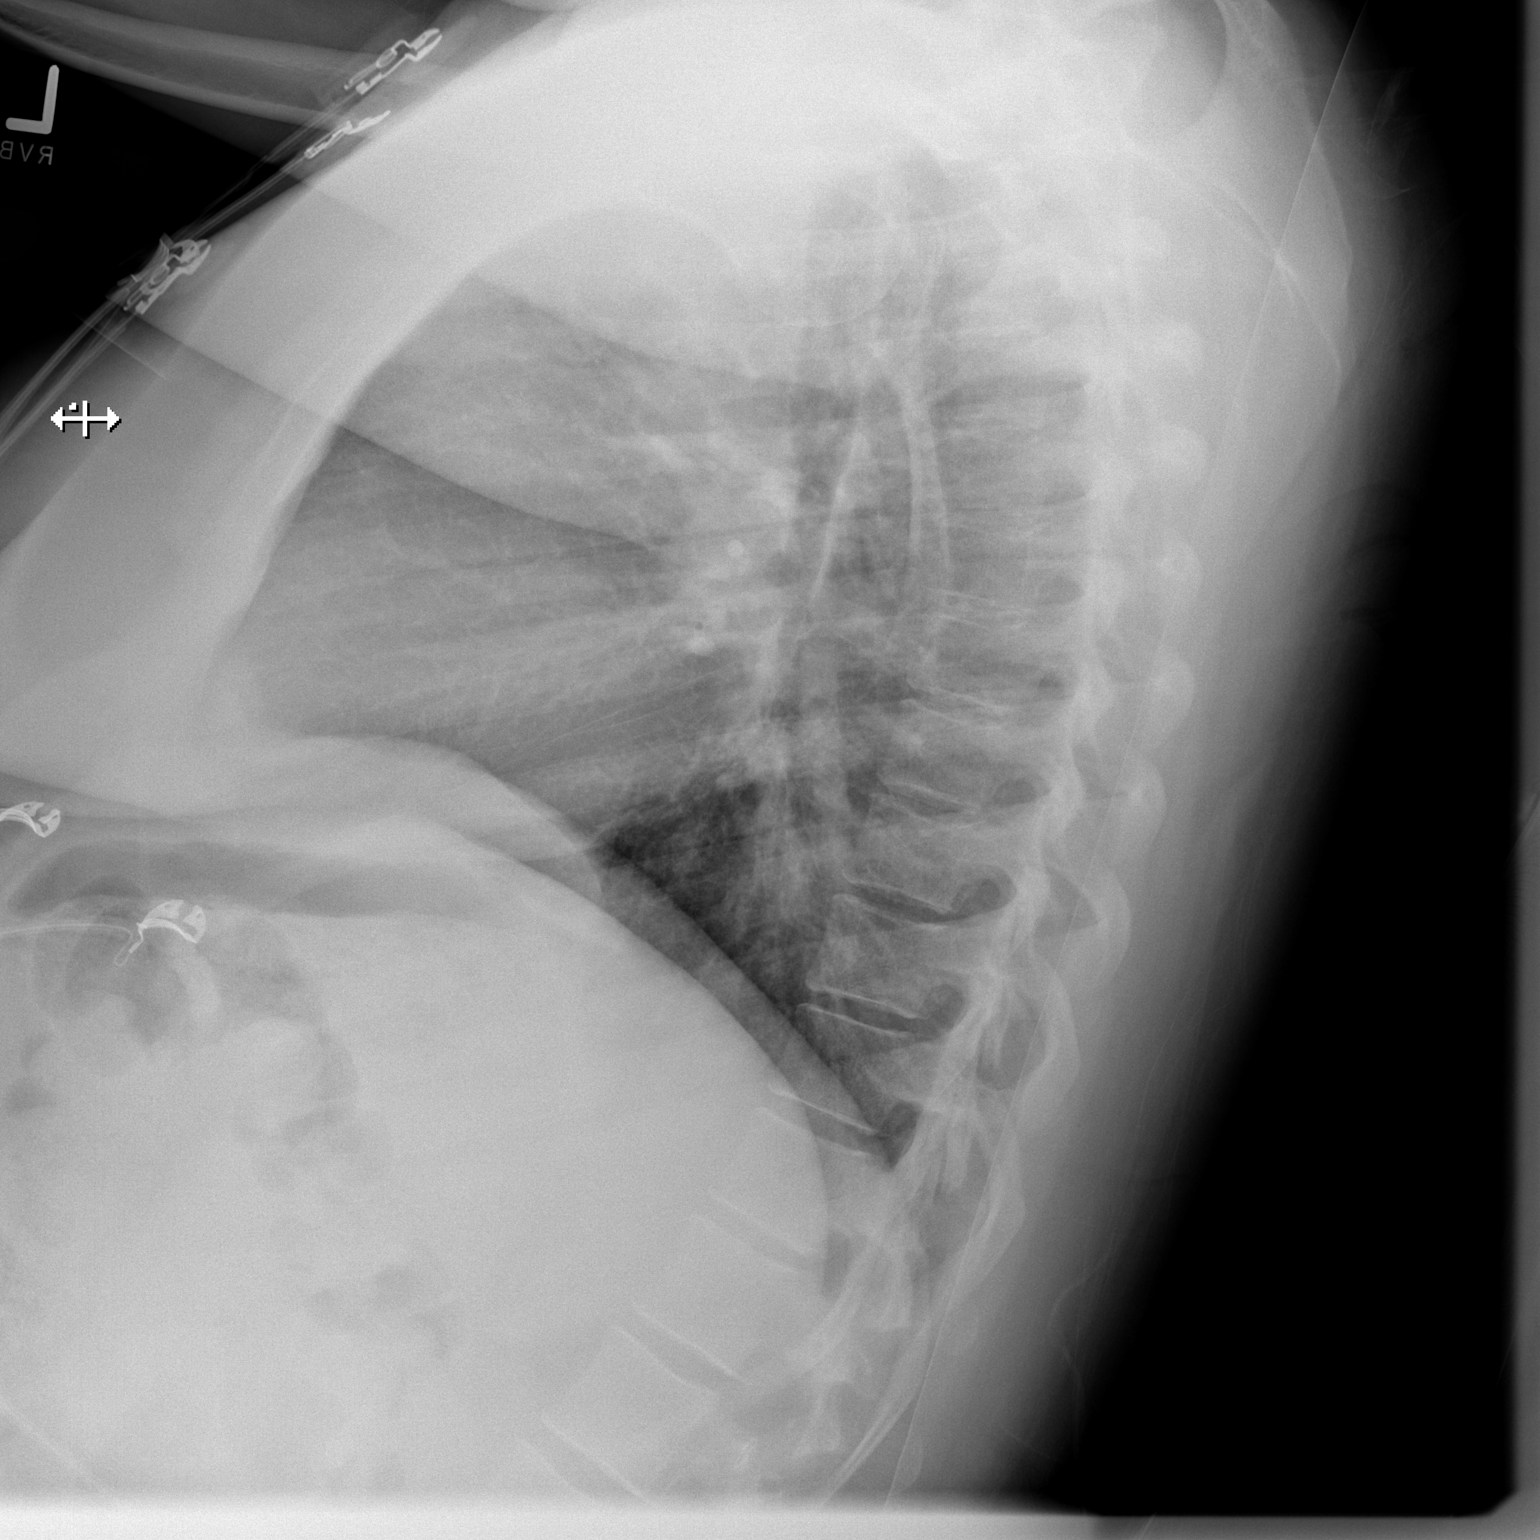

[x chest ap]
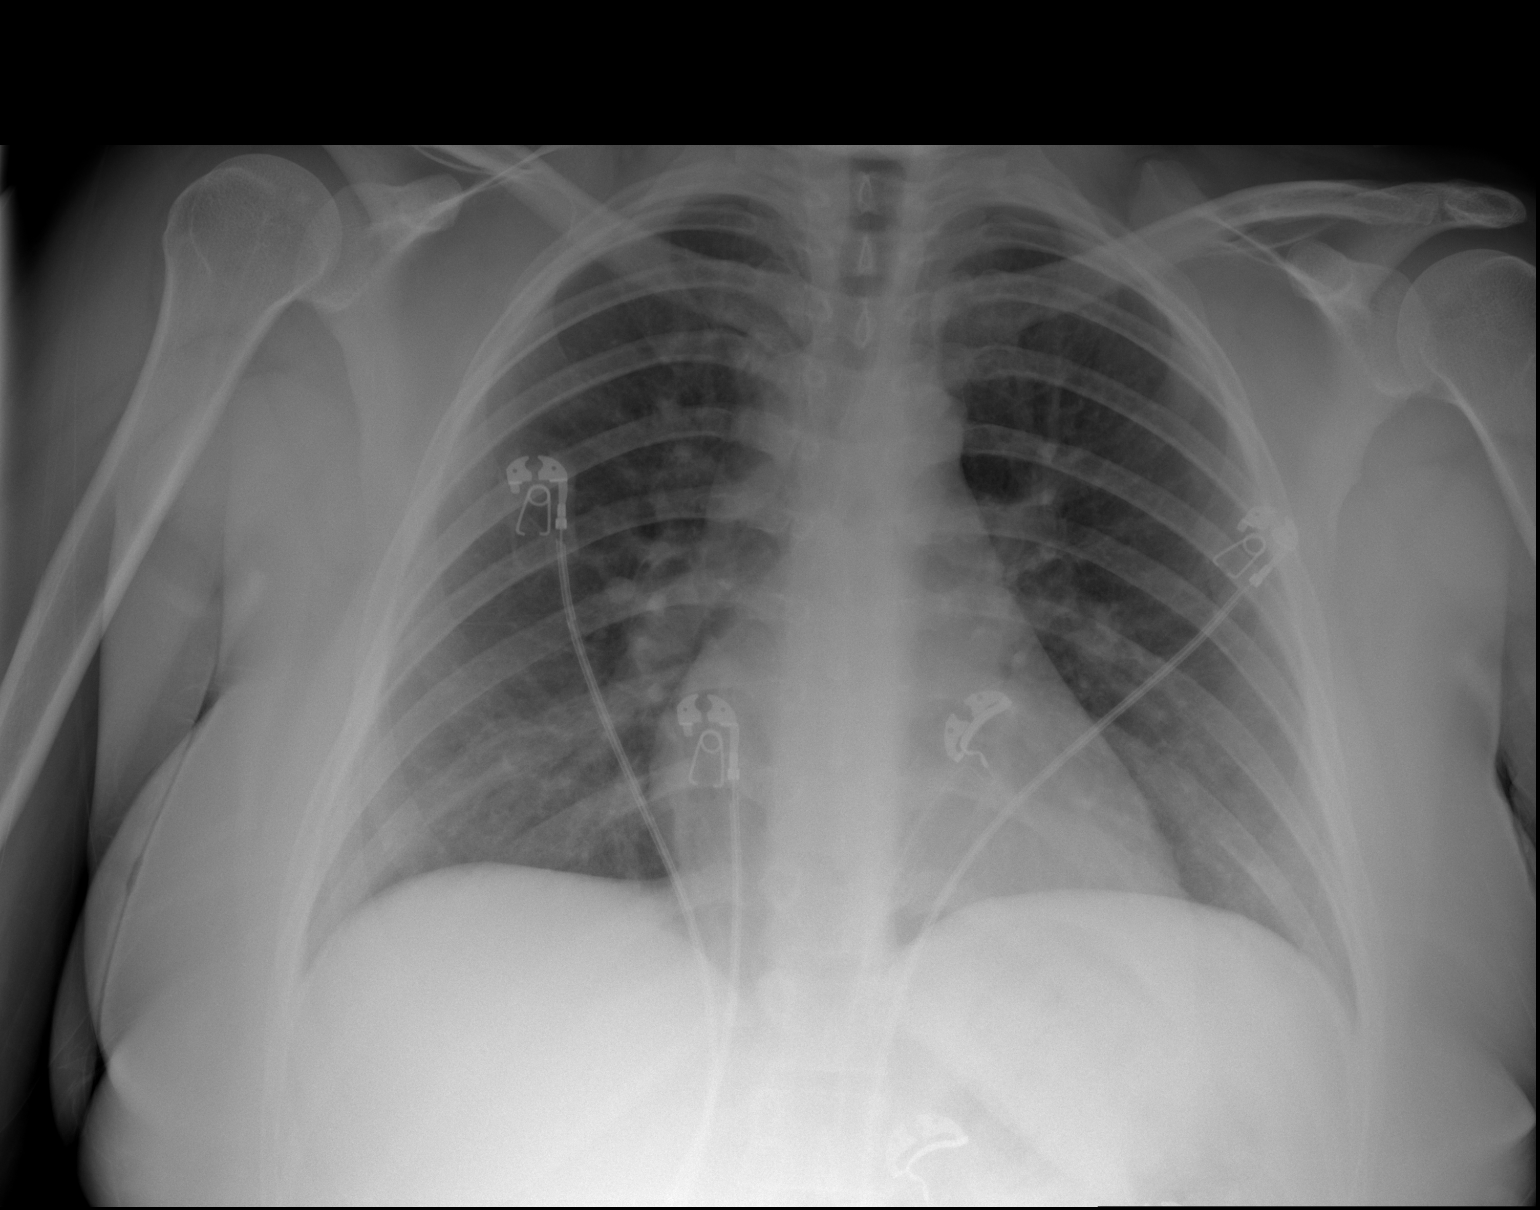

[2 of 2 positions shown; findings below may reference images not displayed]

FINDINGS: The heart size and mediastinal contours are within normal limits.
Both lungs are clear. The visualized skeletal structures are
unremarkable.
IMPRESSION: No active cardiopulmonary disease.
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
